# Patient Record
Sex: Male | Born: 1965 | Race: White | Hispanic: No | Marital: Married | Smoking: Former smoker
Health system: Southern US, Community
[De-identification: ages and names within clinical notes are randomized; demographics above are authoritative.]

## PROBLEM LIST (undated history)

## (undated) ENCOUNTER — Emergency Department
Admission: EM | Discharge status: Against Medical Advice | Payer: BC Managed Care – PPO | Attending: Emergency Medicine | Admitting: Emergency Medicine

## (undated) DIAGNOSIS — IMO0001 Reserved for inherently not codable concepts without codable children: Secondary | ICD-10-CM

## (undated) DIAGNOSIS — H539 Unspecified visual disturbance: Secondary | ICD-10-CM

## (undated) DIAGNOSIS — E119 Type 2 diabetes mellitus without complications: Secondary | ICD-10-CM

## (undated) DIAGNOSIS — M1990 Unspecified osteoarthritis, unspecified site: Secondary | ICD-10-CM

## (undated) DIAGNOSIS — J302 Other seasonal allergic rhinitis: Secondary | ICD-10-CM

## (undated) DIAGNOSIS — G629 Polyneuropathy, unspecified: Secondary | ICD-10-CM

## (undated) DIAGNOSIS — E785 Hyperlipidemia, unspecified: Secondary | ICD-10-CM

## (undated) DIAGNOSIS — T8859XA Other complications of anesthesia, initial encounter: Secondary | ICD-10-CM

## (undated) DIAGNOSIS — I10 Essential (primary) hypertension: Secondary | ICD-10-CM

## (undated) DIAGNOSIS — G4730 Sleep apnea, unspecified: Secondary | ICD-10-CM

## (undated) DIAGNOSIS — K219 Gastro-esophageal reflux disease without esophagitis: Secondary | ICD-10-CM

## (undated) DIAGNOSIS — M545 Low back pain, unspecified: Secondary | ICD-10-CM

## (undated) HISTORY — PX: JOINT REPLACEMENT: SHX530

## (undated) HISTORY — DX: Other seasonal allergic rhinitis: J30.2

## (undated) HISTORY — DX: Reserved for inherently not codable concepts without codable children: IMO0001

## (undated) HISTORY — DX: Type 2 diabetes mellitus without complications: E11.9

## (undated) HISTORY — PX: HAND SURGERY: SHX662

---

## 2005-02-25 DIAGNOSIS — M549 Dorsalgia, unspecified: Secondary | ICD-10-CM | POA: Insufficient documentation

## 2011-07-06 DIAGNOSIS — E1140 Type 2 diabetes mellitus with diabetic neuropathy, unspecified: Secondary | ICD-10-CM | POA: Insufficient documentation

## 2011-07-06 DIAGNOSIS — K029 Dental caries, unspecified: Secondary | ICD-10-CM | POA: Insufficient documentation

## 2011-07-06 DIAGNOSIS — E11319 Type 2 diabetes mellitus with unspecified diabetic retinopathy without macular edema: Secondary | ICD-10-CM | POA: Insufficient documentation

## 2012-01-07 DIAGNOSIS — K219 Gastro-esophageal reflux disease without esophagitis: Secondary | ICD-10-CM | POA: Insufficient documentation

## 2014-05-27 ENCOUNTER — Emergency Department
Admission: EM | Admit: 2014-05-27 | Discharge: 2014-05-27 | Disposition: A | Discharge status: Home / Self Care | Payer: BC Managed Care – PPO | Attending: Emergency Medicine | Admitting: Emergency Medicine

## 2014-05-27 ENCOUNTER — Emergency Department: Payer: BC Managed Care – PPO

## 2014-05-27 DIAGNOSIS — S3992XA Unspecified injury of lower back, initial encounter: Secondary | ICD-10-CM | POA: Insufficient documentation

## 2014-05-27 DIAGNOSIS — M5459 Other low back pain: Secondary | ICD-10-CM

## 2014-05-27 DIAGNOSIS — X58XXXA Exposure to other specified factors, initial encounter: Secondary | ICD-10-CM | POA: Insufficient documentation

## 2014-05-27 DIAGNOSIS — M545 Low back pain: Secondary | ICD-10-CM | POA: Insufficient documentation

## 2014-05-27 MED ORDER — MELOXICAM 7.5 MG PO TABS
7.5000 mg | ORAL_TABLET | Freq: Every day | ORAL | Status: DC
Start: 2014-05-27 — End: 2015-03-10

## 2014-05-27 MED ORDER — CYCLOBENZAPRINE HCL 10 MG PO TABS
10.0000 mg | ORAL_TABLET | Freq: Three times a day (TID) | ORAL | Status: DC | PRN
Start: 2014-05-27 — End: 2016-07-01

## 2014-05-27 MED ORDER — KETOROLAC TROMETHAMINE 30 MG/ML IJ SOLN
INTRAMUSCULAR | Status: AC
Start: 2014-05-27 — End: ?
  Filled 2014-05-27: qty 2

## 2014-05-27 MED ORDER — KETOROLAC TROMETHAMINE 30 MG/ML IJ SOLN
60.0000 mg | Freq: Once | INTRAMUSCULAR | Status: AC
Start: 2014-05-27 — End: 2014-05-27
  Administered 2014-05-27: 60 mg via INTRAMUSCULAR

## 2014-05-27 NOTE — ED Provider Notes (Signed)
[place]  History and Physical Exam       Patient Name: [name], [name]  Encounter Date:  05/27/2014  Physician Assistant: Melynda Keller, PA-C  Attending Physician: Ashley Mariner, MD  PCP: Harlow Ohms, MD  Patient DOB:  [birth year]  MRN:  [id number]  Room:  [id number]      History of Presenting Illness     Chief complaint: Back Pain and Sinusitis    HPI/ROS given by: Patient    [name] is a 49 y.o. male who presents with acute low back pain. 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 Sharp pain in the middle to low back. No radiation of pain or radicular symptoms. Does have a history of degenerative disc disease and chronic low back pain. Denies weakness, bowel/bladder dysfunction, saddle anesthesia, fever, weight loss, night sweats, night pain, IVDA, or personal h/o cancer.      Review of Systems     Review of Systems   Constitutional: Negative for fever, chills and unexpected weight change.   Respiratory: Negative for shortness of breath.    Cardiovascular: Negative for chest pain.   Gastrointestinal: Negative for nausea, vomiting, abdominal pain, diarrhea, constipation, blood in stool and abdominal distention.        No incontinence     Genitourinary: Negative for dysuria, urgency, frequency, hematuria, flank pain and difficulty urinating.        No incontinence or retention   Musculoskeletal: Positive for back pain.   Skin: Negative for pallor.   Neurological: Negative for weakness and numbness.   Psychiatric/Behavioral: Negative for agitation.   All other systems reviewed and are negative.       Allergies & Medications     Pt has No Known Allergies.    Current/Home Medications    No medications on file        Past Medical History     Pt has no past medical history on file.     Past Surgical History     Pt  has no past surgical history on file.     Family History     The family history is not on file.     Social History     Pt has no  tobacco, alcohol, and drug history on file.     Physical Exam     Pulse 115, resp. rate 16, weight 94 kg, SpO2 100 %.    Constitutional: Well developed, well nourished, active, in no apparent distress.  HENT:   Head: Normocephalic, atraumatic  Ears: No external lesions.  Nose: No external lesions. No epistaxis or drainage.  Eyes: PERRL. No scleral icterus. No conjunctival injection. EOMI.  Neck: Trachea is midline. No JVD. Normal range of motion. No apparent masses.  Cardiovascular: Regular rhythm, S1 normal and S2 normal. No murmur heard.  Pulmonary/Chest: Effort normal. Lungs clear to auscultation bilaterally.   Abdominal: Soft, non-tender, non-distended. No masses.   Genitourinay/Anorectal: Defferred  Musculoskeletal:   Pt is able to walk on heels/toes and rise from squat without difficulty.   Strength is 5/5 in quads, hams, iliopsoas, EHL, ankle dorsiflexion and plantarflexion.  Sensation is intact to light touch in L1-S2 dermatomes  Negative straight leg raise.   Patellar reflexes are 2/4 on the right side and 2/4 on the left.  Achilles reflexes are 2/4 on the right side and 2/4 on the left.    Neurological: Pt is alert. Cranial nerves are grossly intact. Moving all  extremities without apparent deficit.   Psychiatric: Affect is appropriate. There is no agitation.   Skin: Skin is warm, dry, well perfused. No rash noted. No cyanosis. No pallor. No apparent wound.       Diagnostic Results     The results of the diagnostic studies below have been reviewed by myself:    Labs  Results     ** No results found for the last 24 hours. **          Radiologic Studies  No results found.       ED Course and Medical Decision Making     ED Medication Orders     Start Ordered     Status Ordering Provider    05/27/14 1805 05/27/14 1804  ketorolac (TORADOL) injection 60 mg   Once in ED     Route: Intramuscular  Ordered Dose: 60 mg     Ordered Zymir Napoli          Differential diagnosis includes mechanical back pain,  lumbosacral radiculopathy, vertebral fracture, epidural compression syndrome/cauda equina syndrome, myelopathy, epidural abscess, diskitis, vertebral osteomyelitis, renal colic, pyelonephritis, AAA, psoas abscess, malignancy.    History and exam consistent with mechanical low back pain. Low suspicion for malignancy, infection, fracture, epidural compression syndrome or other serious pathology considered in the ddx. Pt advised to avoid activities that exacerbate pain, but to remain as active as possible. Advised to avoid lifting >5lbs for 1 week. Advised to return for new or worsening symptoms, fever, bowel/bladder dysfunction, weakness. Pt understands and agrees with treatment plan.     In addition to the above history, please see nursing notes. Allergies, meds, past medical, family, social hx, and the results of the diagnostic studies performed have been reviewed by myself.    I discussed this case with the attending physician in the emergency department and they agree with the assessment and treatment plan.     This chart was generated by an EMR and may contain errors or omissions not intended by the user.     Procedures / Critical Care     None     Diagnosis / Disposition     Clinical Impression  1. Acute mechanical low back pain with duration of less than six weeks    2. Lower back injury, initial encounter        Disposition  ED Disposition     Discharge [name] discharge to home/self care.    Condition at disposition: Stable            Follow up for Discharged Patients  Drucie Ip, MD  [address]  [place] [address]  [phone]    Schedule an appointment as soon as possible for a visit  To establish primary care      Prescriptions for Discharged Patients  New Prescriptions    CYCLOBENZAPRINE (FLEXERIL) 10 MG TABLET    Take 1 tablet (10 mg total) by mouth every 8 (eight) hours as needed for Muscle spasms (pain or muscle spasm).    MELOXICAM (MOBIC) 7.5 MG TABLET    Take 1 tablet (7.5 mg  total) by mouth daily.              Melynda Keller, [place]  05/27/14 1805    Ashley Mariner, MD  05/28/14 1007

## 2014-05-27 NOTE — Discharge Instructions (Signed)
Back Pain [Acute Or Chronic]    Back pain is usually caused by an injury to the muscles or ligaments of the spine. Sometimes the disks that separate each bone in the spine may bulge and cause pain by pressing on a nearby nerve. Back pain may also appear after a sudden twisting/bending force (such as in a car accident), after a simple awkward movement, or lifting something heavy with poor body positioning. In either case, muscle spasm is often present and adds to the pain.  Acute back pain usually gets better in one to two weeks. Back pain related to disk disease, arthritis in the spinal joints or spinal stenosis (narrowing of the spinal canal) can become chronic and last for months or years.  Unless you had a physical injury (for example, a car accident or fall) X-rays are usually not ordered for the initial evaluation of back pain. If pain continues and does not respond to medical treatment, x-rays and other tests may be performed at a later time.  Home Care:   You may need to stay in bed the first few days. But, as soon as possible, begin sitting or walking to avoid problems with prolonged bed rest (muscle weakness, worsening back stiffness and pain, blood clots in the legs).   When in bed, try to find a position of comfort. A firm mattress is best. Try lying flat on your back with pillows under your knees. You can also try lying on your side with your knees bent up towards your chest and a pillow between your knees.   Avoid prolonged sitting. This puts more stress on the lower back than standing or walking.   During the first two days after injury, apply an ICE PACK to the painful area for 20 minutes every 2-4 hours. This will reduce swelling and pain. HEAT (hot shower, hot bath or heating pad) works well for muscle spasm. You can start with ice, then switch to heat after two days. Some patients feel best alternating ice and heat treatments. Use the one method that feels the best to you.   You may use  acetaminophen (Tylenol) or ibuprofen (Motrin, Advil) to control pain, unless another pain medicine was prescribed. [NOTE: If you have chronic liver or kidney disease or ever had a stomach ulcer or GI bleeding, talk with your doctor before using these medicines.]   Be aware of safe lifting methods and do not lift anything over 15 pounds until all the pain is gone.  Follow Up  with your doctor or this facility if your symptoms do not start to improve after one week. Physical therapy may be needed.  [NOTE: If X-rays were taken, they will be reviewed by a radiologist. You will be notified of any new findings that may affect your care.]  Get Prompt Medical Attention  if any of the following occur:  1. Pain becomes worse or spreads to your legs  2. Weakness or numbness in one or both legs  3. Loss of bowel or bladder control  4. Numbness in the groin or genital area   2000-2015 [employer] [facility], [place] [address]. All rights reserved. This information is not intended as a substitute for professional medical care. Always follow your healthcare professional's instructions.          Back Care Tips    These are things you can do to prevent a recurrence of acute back pain and to reduce symptoms from chronic back pain:  Maintain a healthy weight. If you are overweight, losing weight will help most types of back pain.   Exercise is an important part of recovery from most types of back pain. The back is supported by the muscles behind and in front of the spine. This means both the back muscles and the abdominal muscles must be strengthened to provide better support for your spine.   Swimming and brisk walking are good overall exercises to improve your fitness level.   Practice safe lifting methods (below).   Practice good posture when sitting, standing and walking. Avoid prolonged sitting. This puts more stress on the lower back than standing or walking.   Wear quality shoes with  sufficient arch support. Foot and ankle alignment can affect back symptoms. Women should avoid high heels.   Therapeutic massage can help relieve acute and chronic back pain.   During the first two days after an acute injury or flare-up of chronic back pain, apply anice packto the painful area for 20 minutes every 2-4 hours. This will reduce swelling and pain. Heat (hot shower, hot bath, or heating pad) works well for muscle spasm. You can start with ice, then switch to heat after two days. Some patients feel best alternating ice and heat treatments. Use the one method that feels the best to you.   You may use acetaminophen (Tylenol) or ibuprofen (Motrin, Advil) to control pain, unless another medicine was prescribed. [NOTE: If you have chronic liver or kidney disease or ever had a stomach ulcer or GI bleeding, talk with your doctor before using these medicines.]  Lumbar Stretch   Here is a simple stretching exercise that will help relax muscle spasm and keep your back more limber. If exercise makes your back pain worse, don't do it.  5. Lie on your back with your knees bent and both feet on the ground.  6. Slowly raise your left knee to your chest as you flatten your lower back against the floor. Hold for 5 seconds.  7. Relax and repeat the exercise with your right knee.  8. Do 10 of these exercises for each leg.  Safe Lifting Method    Don't bend over at the waist to lift an object off the floor. Instead, bend your knees and hips in a squat.   Keep your back and head upright.   Hold the object close to your body, directly in front of you.   Straighten your legs to lift the object.   Lower the object to the floor in the reverse fashion.   If you must slide something across the floor, push it.  Posture Tips   SITTING  Sit in chairs with straight backs or low-back support. Keep your knees a little higher than your hips. If necessary, use a low stool to prop your feet on, so your feet are resting on  a solid surface.  When driving, sit up straight. Adjust the seat forward so you are not leaning toward the steering wheel. A small pillow or rolled towel behind your lower back may help if you are driving long distances.  STANDING  When standing for long periods, shift most of your weight to one leg at a time. Alternate legs every few minutes.  SLEEPING  The best way to sleep is on your side with your knees bent. Put a low pillow under your head to support your neck in a neutral spine position. Avoid thick pillows that bend your neck to one side. Put a  pillow between your legs to further relax your lower back. If you sleep on your back, put pillows under your knees to support your legs in a slightly flexed position. Use a firm mattress. If your mattress sags, replace it, or use a 1/2-inch plywood board under the mattress to add support.  Follow Up with your doctor or as directed by our staff.  [NOTE: If X-rays, a CT scan or an MRI scan were taken, they will be reviewed by a radiologist. You will be notified of any new findings that may affect your care.]  Return Promptly or contact your doctor if any of the following occur:   Pain becomes worse or spreads to your arms or legs   Weakness or numbness in one or both arms or legs   Loss of bowel or bladder control   Numbness in the groin area   2000-2015 [employer] [facility], [place] [address]. All rights reserved. This information is not intended as a substitute for professional medical care. Always follow your healthcare professional's instructions.          Exercises To Strengthen Your Lower Back  Strong lower-back and abdominal muscles work together to support your spine. These exercises will help strengthen the muscles of the lower back. It is important that you begin exercising slowly and increase levels gradually.  Always begin any exercise program with stretching. If you feel pain while doing any of these exercises, stop and  talk to your doctor about a more specific exercise program that suits your condition better.  Low Back Stretch   Lie on your back with your knees bent and both feet on the ground.    Slowly raise your left knee to your chest as you flatten your lower back against the floor. Hold for 5 seconds.   Relax and repeat the exercise with your right knee.   Do 10 of these exercises for each leg.   Repeat hugging both knees to your chest at the same time.  Building Lower Back Strength  9. Kneeling Lumbar Extension: Begin on your hands and knees. Simultaneously raise and straighten your right arm and left leg until they are parallel to the ground. Hold for 2 seconds and come back slowly to a starting position. Repeat with left arm and right leg, alternating 10 times.  10. Prone Lumbar Extension: Lie face down, arms extended overhead, palms on the floor. Simultaneously raise your right arm and left leg as high as comfortably possible. Hold for 10 seconds and slowly return to start. Repeat with left arm and right leg, alternating 10 times. Gradually build up to 20 times. (Advanced: Repeat this exercise raising both arms and both legs a few inches off the floor at the same time. Hold for 5 seconds and release.)  11. Pelvic Tilt: Lie on the floor on your back with your knees bent at 90 degrees. Your feet should be flat on the floor. Inhale, exhale, then slowly contract your abdominal muscles bringing your navel toward your spine. Let your pelvis rock back until your lower back is flat on the floor. Hold for 10 seconds while breathing smoothly.  12. Abdominal Crunch: Perform a Pelvic Tilt (above) flattening your lower back against the floor. Holding the tension in your abdominal muscles, take another breath and raise your shoulder blades off the ground (this is not a full sit-up). Keep your head in line with your body (don't bend your neck forward). Hold for 2 seconds,  then slowly lower.   [address] [employer] [facility], [place] [address]. All rights reserved. This information is not intended as a substitute for professional medical care. Always follow your healthcare professional's instructions.

## 2014-07-24 ENCOUNTER — Encounter (INDEPENDENT_AMBULATORY_CARE_PROVIDER_SITE_OTHER): Payer: Self-pay | Admitting: Family Medicine

## 2014-07-24 ENCOUNTER — Other Ambulatory Visit
Admission: RE | Admit: 2014-07-24 | Discharge: 2014-07-24 | Disposition: A | Discharge status: Home / Self Care | Payer: BC Managed Care – PPO | Source: Ambulatory Visit | Attending: Family Medicine | Admitting: Family Medicine

## 2014-07-24 ENCOUNTER — Ambulatory Visit (INDEPENDENT_AMBULATORY_CARE_PROVIDER_SITE_OTHER): Payer: BC Managed Care – PPO | Admitting: Family Medicine

## 2014-07-24 VITALS — BP 120/70 | HR 95 | Temp 97.7°F | Resp 16 | Ht 67.0 in | Wt 205.0 lb

## 2014-07-24 DIAGNOSIS — E1140 Type 2 diabetes mellitus with diabetic neuropathy, unspecified: Secondary | ICD-10-CM

## 2014-07-24 DIAGNOSIS — G5601 Carpal tunnel syndrome, right upper limb: Secondary | ICD-10-CM

## 2014-07-24 DIAGNOSIS — F17210 Nicotine dependence, cigarettes, uncomplicated: Secondary | ICD-10-CM

## 2014-07-24 DIAGNOSIS — Z720 Tobacco use: Secondary | ICD-10-CM

## 2014-07-24 DIAGNOSIS — Z794 Long term (current) use of insulin: Secondary | ICD-10-CM

## 2014-07-24 LAB — HEMOGLOBIN A1C: Hgb A1C, %: 12.5 %

## 2014-07-24 LAB — MICROALBUMIN, RANDOM URINE: Microalbumin-Random, U: 1450.05 ug/mL — ABNORMAL HIGH (ref 0.00–20.00)

## 2014-07-24 MED ORDER — ATORVASTATIN CALCIUM 10 MG PO TABS
10.0000 mg | ORAL_TABLET | Freq: Every day | ORAL | Status: DC
Start: ? — End: 2014-07-24

## 2014-07-24 MED ORDER — ASPIRIN 81 MG PO CHEW
81.0000 mg | CHEWABLE_TABLET | Freq: Every day | ORAL | Status: DC
Start: ? — End: 2014-07-24

## 2014-07-24 MED ORDER — METFORMIN HCL 1000 MG PO TABS
1000.0000 mg | ORAL_TABLET | Freq: Two times a day (BID) | ORAL | Status: DC
Start: ? — End: 2014-07-24

## 2014-07-24 MED ORDER — INSULIN DETEMIR 100 UNIT/ML SC SOLN
SUBCUTANEOUS | Status: DC
Start: ? — End: 2014-07-24

## 2014-07-24 MED ORDER — LISINOPRIL 10 MG PO TABS
10.0000 mg | ORAL_TABLET | Freq: Every day | ORAL | Status: DC
Start: ? — End: 2014-07-24

## 2014-07-24 NOTE — Progress Notes (Signed)
PROGRESS NOTE    Date Time: 07/24/2014 10:35 AM  Patient Name: [name], [name]  Primary Care Physician: Rhunette Croft, MD      History of Presenting Illness:   [name] is a 49 y.o. male who presents to the office with   Chief Complaint   Patient presents with   . new patient     get estb    1.  Get established-the patient is new to the practice is here to get established.  His complaint today is of right hand pain as well as monitoring of his diabetes mellitus-  2.  Diabetes mellitus-Patient reports compliance with diet and medication.  Present medications include  Levemir and metformin.  The last eye exam was greater than a year ago..  The patient is taking a daily aspirin, and ACEI or ARB, and a statin.  Denies polyuria, polydipsia, polyphagia, weight change, blurred vision. No history of diabetic retinopathy, diabetic nephropathy, diabetic neuropathy, diabetic gastroparesis.  3.  Right hand pain-he has lancinating pain into the median nerve distribution of the right hand.  He is right-hand dominant.  He's not had any trauma.  He denies swelling or effusion.  Symptoms get worse with repetitive use.  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    Past Medical History:     Past Medical History   Diagnosis Date   . Seasonal allergic rhinitis    . Diabetes mellitus    . Disc        Past Surgical History:   No past surgical history on file.    Family History:     Family History   Problem Relation Age of Onset   . Diabetes Mother    . Depression Sister    . COPD Sister    . Hypertension Brother        Social History:     History   Smoking status   . Current Every Day Smoker -- 0.50 packs/day   . Types: Cigarettes   Smokeless tobacco   . Never Used     History   Alcohol Use No     History   Drug Use No       Allergies:     Allergies   Allergen Reactions   . Flu Virus Vaccine Anaphylaxis       Medications:     Prior to Admission medications    Medication Sig Start Date End Date Taking? Authorizing  Provider   cyclobenzaprine (FLEXERIL) 10 MG tablet Take 1 tablet (10 mg total) by mouth every 8 (eight) hours as needed for Muscle spasms (pain or muscle spasm). 05/27/14  Yes Melynda Keller, PA   meloxicam (MOBIC) 7.5 MG tablet Take 1 tablet (7.5 mg total) by mouth daily. 05/27/14  Yes Melynda Keller, PA   insulin detemir (LEVEMIR) 100 UNIT/ML injection Inject into the skin. 28 units am and 30units pm    [provider]   metFORMIN (GLUCOPHAGE) 1000 MG tablet Take 1,000 mg by mouth 2 (two) times daily with meals.    [provider]       Review of Systems:      Constitutional: Negative for fever, no fatigue, no weight change.   HENT: Negative for ear pain, gets congestion at night, rhinorrhea and neck pain, he has some hearing loss was just tested at [facility], he does have some allergies for which he uses flonase, chronic rhinorrhea,   Eyes: Negative for discharge, redness and itching.  He has has some blurred vision when his blood sugar is elevated  Respiratory: Negative for cough and shortness of breath, no orthopnea. No history of COPD, he is a smoker and knows that he should cut back    Cardiovascular: Negative for chest pain, palpitations and leg swelling. No previous stress or cardiac catheterization.  No history of  EKG in the past   Genitourinary: Negative for dysuria, urgency and difficulty urinating.   Musculoskeletal: Negative for joint swelling and gait problem. He has some numbness, pain and tingling to the R hand because he uses a vibrating tool in his R hand ,  He does have a history of back pain.  He does use flexeril  Neurological: Negative for dizziness, weakness and headaches. No history of head trauma, seizures or CVA  Hematological: Does not bruise/bleed easily.   Psychiatric/Behavioral: Negative.  No uncontrolled depression   GI: Denies nausea, vomiting, diarrhea or constipation, no abdominal pain, no reflux/indigestion, no melena/hematochezia   Derm: no rashes, lesions or  pruritis    Physical Exam:     Filed Vitals:    07/24/14 1016   BP: 120/70   Pulse: 95   Temp: 97.7 F (36.5 C)   Resp: 16   SpO2: 98%     Body mass index is 32.1 kg/(m^2).    General: awake, alert, oriented x 3; no acute distress.  HEENT: eomi, sclera anicteric, oropharynx clear without lesions, mucous membranes moist, normocephalic, atraumatic, Fundiscopic exam with sharp disc margins, no exudate or lesions, no av crossing defects and no copper wiring.  Neck: supple, no lymphadenopathy, no thyromegaly, no JVD, no carotid bruits, trachea midline  Cardiovascular: regular rate and rhythm, no murmurs, rubs or gallops  Lungs: clear to auscultation bilaterally, without wheezing, rhonchi, or rales, resp even and unlabored with normal effort  Abdomen: soft, non-tender, non-distended; no palpable masses, no hepatosplenomegaly, normoactive bowel sounds, no rebound or guarding  Extremities: no clubbing, cyanosis, or edema, bilateral feet are without lesions  Musculoskeletal: No effusion or deformity, Tinel's and Phalen's are positive to the right hand  Neuro: cranial nerves grossly intact, strength 5/5 in upper and lower extremities, DTR 2+ and symmetric to the upper and lower extremities, sensation intact  Skin: no rashes or lesions noted  Psych: Affect and mood are congruent, well groomed, good eye contact  Other:  No results found for this or any previous visit (from the past 2016 hour(s)).         Assessment:     1. Type 2 diabetes mellitus with diabetic neuropathy    2. Carpal tunnel syndrome of right wrist         Plan:     Orders Placed This Encounter   Procedures   . Hemoglobin A1C     Standing Status: Future      Number of Occurrences: 1      Standing Expiration Date: 07/24/2015   . Microalbumin, Random Urine     Standing Status: Future      Number of Occurrences: 1      Standing Expiration Date: 07/25/2015        Requested Prescriptions     Signed Prescriptions Disp Refills   . metFORMIN (GLUCOPHAGE) 1000 MG tablet  180 tablet 3     Sig: Take 1 tablet (1,000 mg total) by mouth 2 (two) times daily with meals.   . insulin detemir (LEVEMIR) 100 UNIT/ML injection 40 mL 3     Sig: Take 30 units daily   .  lisinopril (PRINIVIL,ZESTRIL) 10 MG tablet 90 tablet 3     Sig: Take 1 tablet (10 mg total) by mouth daily.   Marland Kitchen atorvastatin (LIPITOR) 10 MG tablet 30 tablet 2     Sig: Take 1 tablet (10 mg total) by mouth daily.   Marland Kitchen aspirin (ASPIRIN CHILDRENS) 81 MG chewable tablet 90 tablet 0     Sig: Chew 1 tablet (81 mg total) by mouth daily.       Return in about 1 month (around 08/23/2014).    Patient Instructions   1. Take the levemir 30 units daily  2. We will get your A1C today and I will call you with the results  3. I am going to give you a prescription to get a splint to your wrist that I would like for you to use at night only  4. Some of the symptoms to your hand will improve with control of your sugar       Patient was counseled on possible medicine side effects which may include rash, swelling and/or stomach upset.  Patient was instructed to notify me or the ER if they experience problems.    Discussed with patient all diagnostics and consults ordered and addressed patient concerns prior to the completion of the visit.  Patient will be notified of results when they become available.  Encouraged to call for results if no contact within 14 days.     The patient's electronic medical record was reviewed, any changes in the past medical history, past surgical history, medications, diagnostic tests were noted, and the record was updated accordingly.    Discussed option available for my chart which provides electronic access to diagnostic results.      Signed by: Rhunette Croft, MD

## 2014-07-24 NOTE — Progress Notes (Deleted)
***   Help Text ***  LDA Data. This SmartLink displays specified LDA data; either by LDA type or by LDA group ID.  It should be setup for use in a SmartPhrase. If one is not available please Animator.    This SmartLink requires parameters. Parameters are variables which can be added to the [facility] name.  Note: this SmartLink will only display the last documented value for each assessment.  Usage: LDA[LDAtype:showremoved:showdays  LDAtype: A list of LDA types to display, separated by commas.   This can also be set to a specific LDA group ID. If using the group ID, it needs to be prefaced with a 'X'.  showremoved: This is used to show 'removed' LDAs; set this to 1 to show them.  showdays: This is a two piece comma-delimited parameter.   Set the first piece to 1 to display the number of days since insertion/assessment   Set the second piece to 1 to use calendar days. If null, the display will instead use 24 hour periods.  Examples:  LDA[7 outputs the data for all LDA groups with a LDA type of 7   LDA[X10:1:1,1 outputs the data for LDA group 10, shows removed LDAs and number of days using calendar days.

## 2014-07-24 NOTE — Progress Notes (Signed)
Date Specimen Drawn:  07/24/2014   Time Specimen Drawn:  11:31 AM   Test(s) Ordered:  a1c,micro   Disposition:  na   Patient's Tolerance:  well   Location Specimen Drawn:  rac

## 2014-07-24 NOTE — Patient Instructions (Signed)
1. Take the levemir 30 units daily  2. We will get your A1C today and I will call you with the results  3. I am going to give you a prescription to get a splint to your wrist that I would like for you to use at night only  4. Some of the symptoms to your hand will improve with control of your sugar

## 2014-08-22 ENCOUNTER — Ambulatory Visit (INDEPENDENT_AMBULATORY_CARE_PROVIDER_SITE_OTHER): Payer: BC Managed Care – PPO | Admitting: Family Medicine

## 2014-09-02 DIAGNOSIS — G5601 Carpal tunnel syndrome, right upper limb: Secondary | ICD-10-CM | POA: Insufficient documentation

## 2014-09-02 DIAGNOSIS — F17210 Nicotine dependence, cigarettes, uncomplicated: Secondary | ICD-10-CM | POA: Insufficient documentation

## 2014-09-02 DIAGNOSIS — E1165 Type 2 diabetes mellitus with hyperglycemia: Secondary | ICD-10-CM | POA: Insufficient documentation

## 2014-09-02 DIAGNOSIS — E1122 Type 2 diabetes mellitus with diabetic chronic kidney disease: Secondary | ICD-10-CM | POA: Insufficient documentation

## 2014-09-02 DIAGNOSIS — Z794 Long term (current) use of insulin: Secondary | ICD-10-CM | POA: Insufficient documentation

## 2014-09-03 ENCOUNTER — Ambulatory Visit (INDEPENDENT_AMBULATORY_CARE_PROVIDER_SITE_OTHER): Payer: BC Managed Care – PPO | Admitting: Family Medicine

## 2014-09-03 ENCOUNTER — Encounter (INDEPENDENT_AMBULATORY_CARE_PROVIDER_SITE_OTHER): Payer: Self-pay | Admitting: Family Medicine

## 2014-09-03 VITALS — BP 102/68 | HR 93 | Temp 97.5°F | Resp 16 | Ht 67.0 in | Wt 205.0 lb

## 2014-09-03 DIAGNOSIS — E1140 Type 2 diabetes mellitus with diabetic neuropathy, unspecified: Secondary | ICD-10-CM

## 2014-09-03 DIAGNOSIS — G5601 Carpal tunnel syndrome, right upper limb: Secondary | ICD-10-CM

## 2014-09-03 DIAGNOSIS — Z720 Tobacco use: Secondary | ICD-10-CM

## 2014-09-03 MED ORDER — DAPAGLIFLOZIN PROPANEDIOL 5 MG PO TABS
5.0000 mg | ORAL_TABLET | Freq: Every day | ORAL | Status: DC
Start: ? — End: 2014-09-03

## 2014-09-03 NOTE — Patient Instructions (Addendum)
1. We will start [place] and I would like for you to sign up online with the company so that the drug will continue to be free regardless of whether they change their policy.  I would like for you to increase your water intake as this will drop your blood pressure a little  2. Continue the metformin at 500mg  twice daily and we will plan to increase to 750 twice daily in a month and then increase to full dose after another month  3. Continue the levemir at your present dose as we want to lower your blood sugar gradually so you will feel well  4. We will wait about 3 months prior to having you see your eye doctor  5. It is going to take [place] a little while to get your diabetes back under control   6.  Smoking cessation is recommended

## 2014-09-03 NOTE — Progress Notes (Signed)
PROGRESS NOTE    Date Time: 09/03/2014 9:26 AM  Patient Name: [name], [name]  Primary Care Physician: Rhunette Croft, MD      History of Presenting Illness:   LINDBERGH FELIU is a 49 y.o. male who presents to the office with   Chief Complaint   Patient presents with   . Diabetes     90m f/up    1.  Uncontrolled diabetes mellitus type 2 -the patient had been off of his medication for several months because he lost his insurance.Marland Kitchen  He previously had been on Levemir 30 in the morning and 28 units at night.  He does have some blurred vision.  He's also got tingling in his toes and he has some carpal tunnel to the right hand he has had polyuria, polydipsia, polyphagia although all of that has improved.  He did lose a substantial amount of weight while he was off of his diabetic medications.  He is uncircumcised.  He's been checking his blood sugar twice a day.  His mornings have been running at about 2:30.  And his 2 hour postprandial after lunch are running around the 200 range.  He started taking the metformin 1 g twice a day and was not able to tolerate it so he is back down to 500 MG TWICE A DAY AND HE'S DOING FINE WITH THAT.  He was previously seeing an optometrist in [place] but that's been greater than a year since his last exam.  2.  CTS-the patient did get the wrist splint he has not noticed a great deal of improvement since he started using it.  This tends to get worse when he's aware.  There's been no trauma and no effusion or erythema or warmth.    Past Medical History:     Past Medical History   Diagnosis Date   . Seasonal allergic rhinitis    . Diabetes mellitus    . Disc        Past Surgical History:   No past surgical history on file.    Family History:     Family History   Problem Relation Age of Onset   . Diabetes Mother    . Depression Sister    . COPD Sister    . Hypertension Brother        Social History:     History   Smoking status   . Current Every Day Smoker -- 0.50 packs/day   .  Types: Cigarettes   Smokeless tobacco   . Never Used     History   Alcohol Use No     History   Drug Use No       Allergies:     Allergies   Allergen Reactions   . Flu Virus Vaccine Anaphylaxis       Medications:     Prior to Admission medications    Medication Sig Start Date End Date Taking? Authorizing Provider   aspirin (ASPIRIN CHILDRENS) 81 MG chewable tablet Chew 1 tablet (81 mg total) by mouth daily. 07/24/14  Yes Rhunette Croft, MD   atorvastatin (LIPITOR) 10 MG tablet Take 1 tablet (10 mg total) by mouth daily. 07/24/14 07/24/15 Yes Rhunette Croft, MD   insulin detemir (LEVEMIR) 100 UNIT/ML injection Take 30 units daily 07/24/14  Yes Rhunette Croft, MD   lisinopril (PRINIVIL,ZESTRIL) 10 MG tablet Take 1 tablet (10 mg total) by mouth daily. 07/24/14  Yes Rhunette Croft, MD   meloxicam [facility])  7.5 MG tablet Take 1 tablet (7.5 mg total) by mouth daily. 05/27/14  Yes Melynda Keller, PA   metFORMIN (GLUCOPHAGE) 1000 MG tablet Take 1 tablet (1,000 mg total) by mouth 2 (two) times daily with meals. 07/24/14  Yes Rhunette Croft, MD   cyclobenzaprine (FLEXERIL) 10 MG tablet Take 1 tablet (10 mg total) by mouth every 8 (eight) hours as needed for Muscle spasms (pain or muscle spasm). 05/27/14   Melynda Keller, PA       Review of Systems:      Constitutional: Negative for fever, no fatigue, no weight change.   HENT: Negative for ear pain, congestion, rhinorrhea and neck pain, no hearing loss  Eyes: Negative for discharge, redness and itching,   Respiratory: Negative for cough and shortness of breath, no orthopnea.    Cardiovascular: Negative for chest pain, palpitations and leg swelling.   Genitourinary: Negative for dysuria, urgency and difficulty urinating.   Musculoskeletal: Negative for joint swelling and gait problem.  CTS as noted above   Neurological: Negative for dizziness, weakness and headaches.   Hematological: Does not bruise/bleed easily.   Psychiatric/Behavioral: Negative.  No  uncontrolled depression   GI: Denies nausea, vomiting, diarrhea or constipation, no abdominal pain, no reflux/indigestion, no melena/hematochezia   Derm: no rashes, lesions or pruritis    Physical Exam:     Filed Vitals:    09/03/14 0902   BP: 102/68   Pulse: 93   Temp: 97.5 F (36.4 C)   Resp: 16   SpO2: 99%     Body mass index is 32.1 kg/(m^2).    General: awake, alert, oriented x 3; no acute distress.  HEENT: eomi, sclera anicteric, oropharynx clear without lesions, mucous membranes moist, normocephalic, atraumatic, Fundiscopic exam with sharp disc margins, no exudate or lesions, no av crossing defects and no copper wiring.  Neck: supple, no lymphadenopathy, no thyromegaly, no JVD, no carotid bruits, trachea midline  Cardiovascular: regular rate and rhythm, no murmurs, rubs or gallops  Lungs: clear to auscultation bilaterally, without wheezing, rhonchi, or rales, resp even and unlabored with normal effort  Abdomen: soft, non-tender, non-distended; no palpable masses, no hepatosplenomegaly, normoactive bowel sounds, no rebound or guarding  Extremities: no clubbing, cyanosis, or edema, bilateral feet are without lesions but are extremely dry   Musculoskeletal: No effusion or deformity  Neuro: cranial nerves grossly intact, strength 5/5 in upper and lower extremities, DTR 2+ and symmetric to the upper and lower extremities, sensation intact to monofilament to bilateral feet   Skin: no rashes or lesions noted  Psych: Affect and mood are congruent and normal.  , well groomed, good eye contact  Other:  Recent Results (from the past 2016 hour(s))   Hemoglobin A1C    Collection Time: 07/24/14 10:53 AM   Result Value Ref Range    Hgb A1C, % 12.5 %   Microalbumin, Random Urine    Collection Time: 07/24/14 10:53 AM   Result Value Ref Range    Microalbumin-Random, U 1450.05 (H) 0.00 - 20.00 mcg/mL        Assessment:     1. Type 2 diabetes mellitus with diabetic neuropathy    2. Carpal tunnel syndrome of right wrist    3.  Nicotine abuse         Plan:   No orders of the defined types were placed in this encounter.        Requested Prescriptions     Signed Prescriptions Disp Refills   . Dapagliflozin  Propanediol (FARXIGA) 5 MG Tab 30 tablet 11     Sig: Take 5 mg by mouth daily.       Return in about 1 month (around 10/04/2014).    Patient Instructions   1. We will start [place] and I would like for you to sign up online with the company so that the drug will continue to be free regardless of whether they change their policy.  I would like for you to increase your water intake as this will drop your blood pressure a little  2. Continue the metformin at 500mg  twice daily and we will plan to increase to 750 twice daily in a month and then increase to full dose after another month  3. Continue the levemir at your present dose as we want to lower your blood sugar gradually so you will feel well  4. We will wait about 3 months prior to having you see your eye doctor  5. It is going to take [place] a little while to get your diabetes back under control   6.  Smoking cessation is recommended       Patient was counseled on possible medicine side effects which may include rash, swelling and/or stomach upset.  Patient was instructed to notify me or the ER if they experience problems.    Discussed with patient all diagnostics and consults ordered and addressed patient concerns prior to the completion of the visit.  Patient will be notified of results when they become available.  Encouraged to call for results if no contact within 14 days.     The patient's electronic medical record was reviewed, any changes in the past medical history, past surgical history, medications, diagnostic tests were noted, and the record was updated accordingly.    This note was completed using Visual merchandiser. Grammatical errors, random word insertions, pronoun errors and incomplete sentences are therefore possible due to software  limitations. If you have questions or concerns regarding these errors or this dictation, please address them with the provider for clarification.     Discussed option available for my chart which provides electronic access to diagnostic results.        Signed by: Rhunette Croft, MD

## 2014-10-07 ENCOUNTER — Encounter (INDEPENDENT_AMBULATORY_CARE_PROVIDER_SITE_OTHER): Payer: Self-pay | Admitting: Family Medicine

## 2014-10-07 ENCOUNTER — Ambulatory Visit (INDEPENDENT_AMBULATORY_CARE_PROVIDER_SITE_OTHER): Payer: BC Managed Care – PPO | Admitting: Family Medicine

## 2014-10-07 ENCOUNTER — Encounter (INDEPENDENT_AMBULATORY_CARE_PROVIDER_SITE_OTHER): Payer: Self-pay

## 2014-10-07 VITALS — BP 104/58 | HR 103 | Temp 99.1°F | Resp 20 | Ht 67.0 in | Wt 202.0 lb

## 2014-10-07 DIAGNOSIS — K529 Noninfective gastroenteritis and colitis, unspecified: Secondary | ICD-10-CM

## 2014-10-07 MED ORDER — ONDANSETRON 4 MG PO TBDP
4.0000 mg | ORAL_TABLET | Freq: Three times a day (TID) | ORAL | Status: DC | PRN
Start: ? — End: 2014-10-07

## 2014-10-07 NOTE — Patient Instructions (Signed)
1. Zofran is for the nausea and you should let it melt on your tongue for it to be effective  2. Stick with clear liquids today and you can have starchy foods if you are hungry but mostly clear liquids   3. You are doing great with your smoking   4. If you don't feel that you can return to work tomorrow then let me know and we can give you another note

## 2014-10-07 NOTE — Progress Notes (Signed)
PROGRESS NOTE    Date Time: 10/07/2014 12:06 PM  Patient Name: [name], [name]  Primary Care Physician: Rhunette Croft, MD      History of Presenting Illness:   [name] is a 49 y.o. male who presents to the office with   Chief Complaint   Patient presents with   . Abdominal Pain     ,nasea,dizzy,chills,feels like fever, x 3 days,says has acid reflux   Gastroenteritis - symptoms started on Sunday, he had sharp pains in his stomach that radiated into his chest, he had dizziness, nausea, no vomiting, diarrhea Sunday morning,  Fever, malaise.  He thought he was doing better yesterday and did go back to work But was sent home.  Today he has stomach cramps and intermittent chills but has improved.  Blood glucose is less than 200, 120 at night, 180 this morning.  No recent travel.  No ill contact.  He has been feeling feverish. Saturday he ate out but no other members of his party have been ill.  No stools today. He is still having abdominal cramping.  He has decreased his oral intake, he has had anorexia.  The diarrhea has resolved and he had no melanoma or hematochezia.    Past Medical History:     Past Medical History   Diagnosis Date   . Seasonal allergic rhinitis    . Diabetes mellitus    . Disc        Past Surgical History:   No past surgical history on file.    Family History:     Family History   Problem Relation Age of Onset   . Diabetes Mother    . Depression Sister    . COPD Sister    . Hypertension Brother        Social History:     History   Smoking status   . Current Every Day Smoker -- 0.50 packs/day   . Types: Cigarettes   Smokeless tobacco   . Never Used     History   Alcohol Use No     History   Drug Use No       Allergies:     Allergies   Allergen Reactions   . Flu Virus Vaccine Anaphylaxis       Medications:     Prior to Admission medications    Medication Sig Start Date End Date Taking? Authorizing Provider   aspirin (ASPIRIN CHILDRENS) 81 MG chewable tablet Chew 1 tablet (81 mg total)  by mouth daily. 07/24/14  Yes Rhunette Croft, MD   atorvastatin (LIPITOR) 10 MG tablet Take 1 tablet (10 mg total) by mouth daily. 07/24/14 07/24/15 Yes Rhunette Croft, MD   Dapagliflozin Propanediol (FARXIGA) 5 MG Tab Take 5 mg by mouth daily. 09/03/14  Yes Rhunette Croft, MD   insulin detemir (LEVEMIR) 100 UNIT/ML injection Take 30 units daily 07/24/14  Yes Rhunette Croft, MD   lisinopril (PRINIVIL,ZESTRIL) 10 MG tablet Take 1 tablet (10 mg total) by mouth daily. 07/24/14  Yes Rhunette Croft, MD   meloxicam (MOBIC) 7.5 MG tablet Take 1 tablet (7.5 mg total) by mouth daily. 05/27/14  Yes Melynda Keller, PA   metFORMIN (GLUCOPHAGE) 1000 MG tablet Take 1 tablet (1,000 mg total) by mouth 2 (two) times daily with meals. 07/24/14  Yes Rhunette Croft, MD   cyclobenzaprine (FLEXERIL) 10 MG tablet Take 1 tablet (10 mg total) by mouth every 8 (eight) hours as needed for  Muscle spasms (pain or muscle spasm). 05/27/14   Melynda Keller, PA       Review of Systems:      Constitutional: no weight change.   HENT: Negative for ear pain, congestion, rhinorrhea and neck pain, no hearing loss  Eyes: Negative for discharge, redness and itching, no visual disturbance.   Respiratory: Negative for cough and shortness of breath, no orthopnea.    Cardiovascular: Negative for chest pain, palpitations and leg swelling.   Genitourinary: Negative for dysuria, urgency and difficulty urinating.   Musculoskeletal: Negative for joint swelling and gait problem.   Neurological: Negative for dizziness, weakness and headaches.   Hematological: Does not bruise/bleed easily.   Psychiatric/Behavioral: Negative.  No uncontrolled depression   GI: See history of present illness  Derm: no rashes, lesions or pruritis    Physical Exam:     Filed Vitals:    10/07/14 1156   BP: 104/58   Pulse: 103   Temp: 99.1 F (37.3 C)   Resp: 20   SpO2: 97%     Body mass index is 31.63 kg/(m^2).    General: awake, alert, oriented x 3; no  acute distress.  HEENT: eomi, sclera anicteric, oropharynx clear without lesions, mucous membranes dry, normocephalic, atraumatic   Neck: supple, no lymphadenopathy, no thyromegaly, no JVD, no carotid bruits, trachea midline  Cardiovascular: regular rate and rhythm, no murmurs, rubs or gallops  Lungs: clear to auscultation bilaterally, without wheezing, rhonchi, or rales, resp even and unlabored with normal effort  Abdomen: soft, tenderness to the right upper quadrant but with negative Murphy's, non-distended; no palpable masses, no hepatosplenomegaly, hyperactive bowel sounds, no rebound or guarding  Extremities: no clubbing, cyanosis, or edema  Musculoskeletal: No effusion or deformity  Neuro: cranial nerves grossly intact  Skin: no rashes or lesions noted  Psych: Affect and mood are congruent and normal, well groomed, good eye contact  Other:   Recent Results (from the past 2016 hour(s))   Hemoglobin A1C    Collection Time: 07/24/14 10:53 AM   Result Value Ref Range    Hgb A1C, % 12.5 %   Microalbumin, Random Urine    Collection Time: 07/24/14 10:53 AM   Result Value Ref Range    Microalbumin-Random, U 1450.05 (H) 0.00 - 20.00 mcg/mL        Assessment:     1. Gastroenteritis         Plan:   No orders of the defined types were placed in this encounter.        Requested Prescriptions     Signed Prescriptions Disp Refills   . ondansetron (ZOFRAN ODT) 4 MG disintegrating tablet 20 tablet 0     Sig: Take 1 tablet (4 mg total) by mouth every 8 (eight) hours as needed for Nausea.       Return if symptoms worsen or fail to improve.    Patient Instructions   1. Zofran is for the nausea and you should let it melt on your tongue for it to be effective  2. Stick with clear liquids today and you can have starchy foods if you are hungry but mostly clear liquids   3. You are doing great with your smoking   4. If you don't feel that you can return to work tomorrow then let me know and we can give you another note       Patient  was counseled on possible medicine side effects which may include rash, swelling and/or stomach upset.  Patient was instructed to notify me or the ER if they experience problems.    Discussed with patient all diagnostics and consults ordered and addressed patient concerns prior to the completion of the visit.  Patient will be notified of results when they become available.  Encouraged to call for results if no contact within 14 days.     The patient's electronic medical record was reviewed, any changes in the past medical history, past surgical history, medications, diagnostic tests were noted, and the record was updated accordingly.    This note was completed using Visual merchandiser. Grammatical errors, random word insertions, pronoun errors and incomplete sentences are therefore possible due to software limitations. If you have questions or concerns regarding these errors or this dictation, please address them with the provider for clarification.     Discussed option available for my chart which provides electronic access to diagnostic results.        Signed by: Rhunette Croft, MD

## 2014-10-10 ENCOUNTER — Encounter (INDEPENDENT_AMBULATORY_CARE_PROVIDER_SITE_OTHER): Payer: Self-pay

## 2014-10-10 ENCOUNTER — Ambulatory Visit (INDEPENDENT_AMBULATORY_CARE_PROVIDER_SITE_OTHER): Payer: BC Managed Care – PPO | Admitting: Family Medicine

## 2014-10-10 ENCOUNTER — Encounter (INDEPENDENT_AMBULATORY_CARE_PROVIDER_SITE_OTHER): Payer: Self-pay | Admitting: Family Medicine

## 2014-10-10 ENCOUNTER — Ambulatory Visit
Admission: RE | Admit: 2014-10-10 | Discharge: 2014-10-10 | Disposition: A | Discharge status: Home / Self Care | Payer: BC Managed Care – PPO | Source: Ambulatory Visit | Attending: Family Medicine | Admitting: Family Medicine

## 2014-10-10 ENCOUNTER — Other Ambulatory Visit
Admission: RE | Admit: 2014-10-10 | Discharge: 2014-10-10 | Disposition: A | Discharge status: Home / Self Care | Payer: BC Managed Care – PPO | Source: Ambulatory Visit | Attending: Family Medicine | Admitting: Family Medicine

## 2014-10-10 VITALS — BP 124/66 | HR 105 | Temp 99.6°F | Resp 20 | Ht 67.0 in | Wt 202.0 lb

## 2014-10-10 DIAGNOSIS — M79672 Pain in left foot: Secondary | ICD-10-CM | POA: Insufficient documentation

## 2014-10-10 DIAGNOSIS — E11621 Type 2 diabetes mellitus with foot ulcer: Secondary | ICD-10-CM

## 2014-10-10 DIAGNOSIS — L97529 Non-pressure chronic ulcer of other part of left foot with unspecified severity: Secondary | ICD-10-CM

## 2014-10-10 DIAGNOSIS — E1140 Type 2 diabetes mellitus with diabetic neuropathy, unspecified: Secondary | ICD-10-CM

## 2014-10-10 MED ORDER — CEPHALEXIN 500 MG PO CAPS
500.0000 mg | ORAL_CAPSULE | Freq: Four times a day (QID) | ORAL | Status: DC
Start: ? — End: 2014-10-10

## 2014-10-10 MED ORDER — METFORMIN HCL 1000 MG PO TABS
1000.0000 mg | ORAL_TABLET | Freq: Two times a day (BID) | ORAL | Status: DC
Start: ? — End: 2014-10-10

## 2014-10-10 MED ORDER — SILVER SULFADIAZINE 1 % EX CREA
TOPICAL_CREAM | CUTANEOUS | Status: DC
Start: ? — End: 2014-10-10

## 2014-10-10 MED ORDER — ATORVASTATIN CALCIUM 10 MG PO TABS
10.0000 mg | ORAL_TABLET | Freq: Every day | ORAL | Status: DC
Start: ? — End: 2014-10-10

## 2014-10-10 NOTE — Progress Notes (Signed)
PROGRESS NOTE    Date Time: 10/10/2014 9:43 AM  Patient Name: [name], [name]  Primary Care Physician: Rhunette Croft, MD      History of Presenting Illness:   [name] is a 49 y.o. male who presents to the office with   Chief Complaint   Patient presents with   . Diabetes     f/up 69m   . Abrasion     side of left great toe x 7 days,small are on right foot little toe x 2 weeks   1. Diabetes Mellitus - Patient reports compliance with diet and medication.  Present medications include  levemir units and farxiga.  The patient is taking a daily aspirin, and ACEI or ARB, and a statin.  Denies polyuria, polydipsia, polyphagia, weight change, blurred vision.  Patient is monitoring fingerstick glucose and has readings of 130 - 150  with isolated reading over 200.  No history of diabetic retinopathy, diabetic nephropathy, diabetic neuropathy, diabetic gastroparesis.  He has a diabetic foot ulcer as noted below  2.  Ulcer to the left great toe medially over the MCP-the patient states he had an open sore to that area for about the past 7 days.  He has missed couple of days of work.  He wears steel toes of his boots were rubbing on that area.  He had a blister and then the skin sloughed off and has been open.  It's not painful.  There is no erythema to the area.  He is concerned because his left toe was the same toe that he had had an ulcer that went to the bone, and he almost lost the toe.      Past Medical History:     Past Medical History   Diagnosis Date   . Seasonal allergic rhinitis    . Diabetes mellitus    . Disc        Past Surgical History:   No past surgical history on file.    Family History:     Family History   Problem Relation Age of Onset   . Diabetes Mother    . Depression Sister    . COPD Sister    . Hypertension Brother        Social History:     History   Smoking status   . Current Every Day Smoker -- 0.50 packs/day   . Types: Cigarettes   Smokeless tobacco   . Never Used     History   Alcohol  Use No     History   Drug Use No       Allergies:     Allergies   Allergen Reactions   . Flu Virus Vaccine Anaphylaxis       Medications:     Prior to Admission medications    Medication Sig Start Date End Date Taking? Authorizing Provider   aspirin (ASPIRIN CHILDRENS) 81 MG chewable tablet Chew 1 tablet (81 mg total) by mouth daily. 07/24/14  Yes Rhunette Croft, MD   atorvastatin (LIPITOR) 10 MG tablet Take 1 tablet (10 mg total) by mouth daily. 07/24/14 07/24/15 Yes Rhunette Croft, MD   cyclobenzaprine (FLEXERIL) 10 MG tablet Take 1 tablet (10 mg total) by mouth every 8 (eight) hours as needed for Muscle spasms (pain or muscle spasm). 05/27/14  Yes Melynda Keller, PA   Dapagliflozin Propanediol (FARXIGA) 5 MG Tab Take 5 mg by mouth daily. 09/03/14  Yes Rhunette Croft, MD   insulin  detemir (LEVEMIR) 100 UNIT/ML injection Take 30 units daily 07/24/14  Yes Rhunette Croft, MD   lisinopril (PRINIVIL,ZESTRIL) 10 MG tablet Take 1 tablet (10 mg total) by mouth daily. 07/24/14  Yes Rhunette Croft, MD   meloxicam (MOBIC) 7.5 MG tablet Take 1 tablet (7.5 mg total) by mouth daily. 05/27/14  Yes Melynda Keller, PA   metFORMIN (GLUCOPHAGE) 1000 MG tablet Take 1 tablet (1,000 mg total) by mouth 2 (two) times daily with meals. 07/24/14  Yes Rhunette Croft, MD   ondansetron (ZOFRAN ODT) 4 MG disintegrating tablet Take 1 tablet (4 mg total) by mouth every 8 (eight) hours as needed for Nausea. 10/07/14  Yes Rhunette Croft, MD       Review of Systems:      Constitutional: Negative for fever, no fatigue, no weight change.   HENT: Negative for ear pain, congestion, rhinorrhea and neck pain, no hearing loss  Eyes: Negative for discharge, redness and itching, no visual disturbance.   Respiratory: Negative for cough and shortness of breath, no orthopnea.    Cardiovascular: Negative for chest pain, palpitations and leg swelling.   Genitourinary: Negative for dysuria, urgency and difficulty urinating.    Musculoskeletal: Negative for joint swelling and gait problem.   Neurological: Negative for dizziness, weakness and headaches.   Hematological: Does not bruise/bleed easily.   Psychiatric/Behavioral: Negative.  No uncontrolled depression   GI: Denies nausea, vomiting, diarrhea or constipation, no abdominal pain, no reflux/indigestion, no melena/hematochezia   Derm: See history of present illness    Physical Exam:     Filed Vitals:    10/10/14 0918   BP: 124/66   Pulse: 105   Temp: 99.6 F (37.6 C)   Resp: 20   SpO2: 99%     Body mass index is 31.63 kg/(m^2).    General: awake, alert, oriented x 3; no acute distress.  HEENT: eomi, sclera anicteric, oropharynx clear without lesions, mucous membranes moist, normocephalic, atraumatic, Fundiscopic exam with sharp disc margins, no exudate or lesions, no av crossing defects and no copper wiring.  Neck: supple, no lymphadenopathy, no thyromegaly, no JVD, no carotid bruits, trachea midline  Cardiovascular: regular rate and rhythm, no murmurs, rubs or gallops  Lungs: clear to auscultation bilaterally, without wheezing, rhonchi, or rales, resp even and unlabored with normal effort  Abdomen: soft, non-tender, non-distended; no palpable masses, no hepatosplenomegaly, normoactive bowel sounds, no rebound or guarding  Extremities: no clubbing, cyanosis, or edema  Musculoskeletal: No effusion or deformity  Neuro: cranial nerves grossly intact, strength 5/5 in upper and lower extremities, DTR 2+ and symmetric to the upper and lower extremities, sensation intact  Skin: Ulcer over the first left medial MTP, joint, ulcerated with yellow exudate to the lesions with an erythematous margin.  Psych: Affect and mood are congruent, well groomed, good eye contact  Other:   Recent Results (from the past 2016 hour(s))   Hemoglobin A1C    Collection Time: 07/24/14 10:53 AM   Result Value Ref Range    Hgb A1C, % 12.5 %   Microalbumin, Random Urine    Collection Time: 07/24/14 10:53 AM   Result  Value Ref Range    Microalbumin-Random, U 1450.05 (H) 0.00 - 20.00 mcg/mL            Assessment:     1. Type 2 diabetes mellitus with diabetic neuropathy    2. Diabetic ulcer of left foot associated with type 2 diabetes mellitus  Plan:     Orders Placed This Encounter   Procedures   . [facility] Wound Culture and Smear     Standing Status: Future      Number of Occurrences: 1      Standing Expiration Date: 10/10/2015     Order Specific Question:  Specimen     Answer:  Wound     Order Specific Question:  Source Detail     Answer:  Foot   . XR FOOT LEFT AP AND LATERAL     Standing Status: Future      Number of Occurrences: 1      Standing Expiration Date: 10/10/2015     Order Specific Question:  Reason for Exam:     Answer:  evaluate for osteomyelitis   . MRI FOOT LEFT WO CONTRAST     Standing Status: Future      Number of Occurrences:       Standing Expiration Date: 10/10/2015     Order Specific Question:  Does the patient have a pacemaker or defibrillator?     Answer:  No     Order Specific Question:  Reason for Exam:     Answer:  rule out osteoarthritis   . Ambulatory referral to Physical Therapy     Referral Priority:  Routine     Referral Type:  Physical Medicine     Referral Reason:  Specialty Services Required     Requested Specialty:  Physical Therapy     Number of Visits Requested:  1   . Wound Care - PT     Standing Status: Future      Number of Occurrences:       Standing Expiration Date: 10/10/2015        Requested Prescriptions     Signed Prescriptions Disp Refills   . metFORMIN (GLUCOPHAGE) 1000 MG tablet 180 tablet 3     Sig: Take 1 tablet (1,000 mg total) by mouth 2 (two) times daily with meals.   Marland Kitchen atorvastatin (LIPITOR) 10 MG tablet 90 tablet 3     Sig: Take 1 tablet (10 mg total) by mouth daily.   . silver sulfADIAZINE (SILVADENE, SSD) 1 % cream 50 g 1     Sig: Apply to affected area daily   . cephALEXin (KEFLEX) 500 MG capsule 40 capsule 0     Sig: Take 1 capsule (500 mg total) by mouth 4 (four) times  daily.       Return in about 1 month (around 11/10/2014).    Patient Instructions   1. Diabetes - continue the farxiga, levemir 30 units and metformin 1000mg  twice daily  2. Xray of the foot today if it is negative then I want to get an MRI of your foot  3. Dressing change daily with silvadene and we will start you on antibiotic Keflex  4. Light duty for 1 week  4. I referred you to physical therapy in [place] to the wound care clinic   5. Keep you foot elevated as much as possible  6. Continue to use the gold bond diabetic foot cream  7. Follow up in 1 month       Patient was counseled on possible medicine side effects which may include rash, swelling and/or stomach upset.  Patient was instructed to notify me or the ER if they experience problems.    Discussed with patient all diagnostics and consults ordered and addressed patient concerns prior to the completion of the visit.  Patient will  be notified of results when they become available.  Encouraged to call for results if no contact within 14 days.     The patient's electronic medical record was reviewed, any changes in the past medical history, past surgical history, medications, diagnostic tests were noted, and the record was updated accordingly.    Discussed option available for my chart which provides electronic access to diagnostic results.        Signed by: Rhunette Croft, MD

## 2014-10-10 NOTE — Patient Instructions (Signed)
1. Diabetes - continue the farxiga, levemir 30 units and metformin 1000mg  twice daily  2. Xray of the foot today if it is negative then I want to get an MRI of your foot  3. Dressing change daily with silvadene and we will start you on antibiotic Keflex  4. Light duty for 1 week  4. I referred you to physical therapy in [place] to the wound care clinic   5. Keep you foot elevated as much as possible  6. Continue to use the gold bond diabetic foot cream  7. Follow up in 1 month

## 2014-10-28 ENCOUNTER — Encounter (INDEPENDENT_AMBULATORY_CARE_PROVIDER_SITE_OTHER): Payer: Self-pay

## 2014-10-28 ENCOUNTER — Ambulatory Visit (INDEPENDENT_AMBULATORY_CARE_PROVIDER_SITE_OTHER): Payer: BC Managed Care – PPO | Admitting: Family Medicine

## 2014-10-28 ENCOUNTER — Encounter (INDEPENDENT_AMBULATORY_CARE_PROVIDER_SITE_OTHER): Payer: Self-pay | Admitting: Family Medicine

## 2014-10-28 ENCOUNTER — Other Ambulatory Visit
Admission: RE | Admit: 2014-10-28 | Discharge: 2014-10-28 | Disposition: A | Discharge status: Home / Self Care | Payer: BC Managed Care – PPO | Source: Ambulatory Visit | Attending: Family Medicine | Admitting: Family Medicine

## 2014-10-28 VITALS — BP 140/74 | HR 84 | Temp 98.0°F | Resp 16 | Ht 72.0 in | Wt 210.0 lb

## 2014-10-28 DIAGNOSIS — E11621 Type 2 diabetes mellitus with foot ulcer: Secondary | ICD-10-CM

## 2014-10-28 DIAGNOSIS — Z720 Tobacco use: Secondary | ICD-10-CM

## 2014-10-28 DIAGNOSIS — L97529 Non-pressure chronic ulcer of other part of left foot with unspecified severity: Secondary | ICD-10-CM

## 2014-10-28 DIAGNOSIS — E1140 Type 2 diabetes mellitus with diabetic neuropathy, unspecified: Secondary | ICD-10-CM

## 2014-10-28 DIAGNOSIS — Z794 Long term (current) use of insulin: Secondary | ICD-10-CM

## 2014-10-28 DIAGNOSIS — F17210 Nicotine dependence, cigarettes, uncomplicated: Secondary | ICD-10-CM

## 2014-10-28 LAB — COMPREHENSIVE METABOLIC PANEL
ALT: 23 U/L (ref 0–55)
AST (SGOT): 15 U/L (ref 10–42)
Albumin/Globulin Ratio: 1.21 Ratio (ref 0.70–1.50)
Albumin: 3.5 gm/dL (ref 3.5–5.0)
Alkaline Phosphatase: 84 U/L (ref 40–145)
Anion Gap: 12.8 mMol/L (ref 7.0–18.0)
BUN / Creatinine Ratio: 20.4 Ratio (ref 10.0–30.0)
BUN: 20 mg/dL (ref 7–22)
Bilirubin, Total: 0.3 mg/dL (ref 0.1–1.2)
CO2: 24.1 mMol/L (ref 20.0–30.0)
Calcium: 9.2 mg/dL (ref 8.5–10.5)
Chloride: 107 mMol/L (ref 98–110)
Creatinine: 0.98 mg/dL (ref 0.80–1.30)
EGFR: >60 mL/min/{1.73_m2}
Globulin: 2.9 gm/dL (ref 2.0–4.0)
Glucose: 254 mg/dL — ABNORMAL HIGH (ref 70–99)
Osmolality Calc: 289 mOsm/kg (ref 275–300)
Potassium: 4.9 mMol/L (ref 3.5–5.3)
Protein, Total: 6.4 gm/dL (ref 6.0–8.3)
Sodium: 139 mMol/L (ref 136–147)

## 2014-10-28 LAB — LIPID PANEL
Cholesterol: 186 mg/dL (ref 75–199)
Coronary Heart Disease Risk: 4.65
HDL: 40 mg/dL (ref 40–55)
LDL Calculated: 91 mg/dL
Triglycerides: 273 mg/dL — ABNORMAL HIGH (ref 10–150)
VLDL: 55 — ABNORMAL HIGH (ref 0–40)

## 2014-10-28 LAB — HEMOGLOBIN A1C: Hgb A1C, %: 9.1 %

## 2014-10-28 LAB — MICROALBUMIN, RANDOM URINE: Microalbumin-Random, U: 1081.69 ug/mL — ABNORMAL HIGH (ref 0.00–20.00)

## 2014-10-28 MED ORDER — GABAPENTIN 100 MG PO CAPS
100.0000 mg | ORAL_CAPSULE | Freq: Every evening | ORAL | Status: DC
Start: ? — End: 2014-10-28

## 2014-10-28 NOTE — Progress Notes (Signed)
Date Specimen Drawn:  10/28/2014   Time Specimen Drawn:  10:18 AM   Test(s) Ordered:  Lip,cmp,a1c,micro   Disposition:  na   Patient's Tolerance:  well   Location Specimen Drawn:  [facility]

## 2014-10-28 NOTE — Patient Instructions (Signed)
1. Diabetic education class  2. Continue present wound management  3. Goal A1C 6.5-7  4. Goal blood pressure 130/80  5. Excellent on your smoking cessation plans  6. I will refer you to the eye doctor  7. Continue your present medications

## 2014-10-28 NOTE — Progress Notes (Signed)
PROGRESS NOTE    Date Time: 10/28/2014 9:47 AM  Patient Name: [name], [name]  Primary Care Physician: Rhunette Croft, MD      History of Presenting Illness:   JAKIEM SCOGGINS is a 49 y.o. male who presents to the office with   Chief Complaint   Patient presents with   . Diabetes     31m f/up   . diabetic ulcer     left foot    1.  Diabetes mellitus-Patient reports compliance with diet and medication.  Present medications include  Levemir 30 units metformin 1 g twice a day, first seeing 5 mg daily.  The last eye exam was  a few years ago.  The patient is taking a daily aspirin, and ACEI, and a statin.  Denies polyuria, polydipsia, polyphagia, weight change, blurred vision.  Patient is monitoring fingerstick glucose and has readings of 100-140.  No history of diabetic retinopathy, diabetic nephropathy,  diabetic gastroparesis.the patient does have a history of diabetic neuropathy and he has numbness to his feet that he doesn't notice during the day when he has shoes on but when he goes to bed the numbness.  He started wearing his shoes too bad as this does help.  He also has a diabetic foot ulcer  2.  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  He did not go for wound care.  He's been using the Silvadene to the wound and it is closing up well.  3.  Nicotine abuse-the patient is down to just diffusely cigarettes and plans to quit this week.  He has been able to quit for 6 years past    Past Medical History:     Past Medical History   Diagnosis Date   . Seasonal allergic rhinitis    . Diabetes mellitus    . Disc        Past Surgical History:   No past surgical history on file.    Family History:     Family History   Problem Relation Age of Onset   . Diabetes Mother    . Depression Sister    . COPD Sister    . Hypertension Brother        Social History:     History   Smoking status   .  Current Every Day Smoker -- 0.50 packs/day   . Types: Cigarettes   Smokeless tobacco   . Never Used     History   Alcohol Use No     History   Drug Use No       Allergies:     Allergies   Allergen Reactions   . Flu Virus Vaccine Anaphylaxis       Medications:     Prior to Admission medications    Medication Sig Start Date End Date Taking? Authorizing Provider   aspirin (ASPIRIN CHILDRENS) 81 MG chewable tablet Chew 1 tablet (81 mg total) by mouth daily. 07/24/14  Yes Rhunette Croft, MD   atorvastatin (LIPITOR) 10 MG tablet Take 1 tablet (10 mg total) by mouth daily. 10/10/14 10/10/15 Yes Rhunette Croft, MD   Dapagliflozin Propanediol (FARXIGA) 5 MG Tab Take 5 mg by mouth daily. 09/03/14  Yes Rhunette Croft, MD   insulin detemir (LEVEMIR) 100 UNIT/ML injection Take 30 units daily 07/24/14  Yes Rhunette Croft, MD  lisinopril (PRINIVIL,ZESTRIL) 10 MG tablet Take 1 tablet (10 mg total) by mouth daily. 07/24/14  Yes Rhunette Croft, MD   metFORMIN (GLUCOPHAGE) 1000 MG tablet Take 1 tablet (1,000 mg total) by mouth 2 (two) times daily with meals. 10/10/14  Yes Rhunette Croft, MD   silver sulfADIAZINE (SILVADENE, SSD) 1 % cream Apply to affected area daily 10/10/14 10/10/15 Yes Rhunette Croft, MD   cyclobenzaprine (FLEXERIL) 10 MG tablet Take 1 tablet (10 mg total) by mouth every 8 (eight) hours as needed for Muscle spasms (pain or muscle spasm). 05/27/14   Melynda Keller, PA   meloxicam (MOBIC) 7.5 MG tablet Take 1 tablet (7.5 mg total) by mouth daily. 05/27/14   Melynda Keller, PA   ondansetron (ZOFRAN ODT) 4 MG disintegrating tablet Take 1 tablet (4 mg total) by mouth every 8 (eight) hours as needed for Nausea. 10/07/14   Rhunette Croft, MD       Review of Systems:      Constitutional: Negative for fever, no fatigue, no weight change.   HENT: Negative for ear pain, congestion, rhinorrhea and neck pain, no hearing loss  Eyes: Negative for discharge, redness and itching, no  visual disturbance.   Respiratory: Negative for cough and shortness of breath, no orthopnea.    Cardiovascular: Negative for chest pain, palpitations and leg swelling.   Genitourinary: Negative for dysuria, urgency and difficulty urinating.   Musculoskeletal: Negative for joint swelling and gait problem.   Neurological: Negative for dizziness, weakness and headaches.   Hematological: Does not bruise/bleed easily.   Psychiatric/Behavioral: Negative.  No uncontrolled depression   GI: Denies nausea, vomiting, diarrhea or constipation, no abdominal pain, no reflux/indigestion, no melena/hematochezia   Derm: See history of present illness    Physical Exam:     Filed Vitals:    10/28/14 0904   BP: 140/74   Pulse: 84   Temp: 98 F (36.7 C)   Resp: 16   SpO2: 96%     Body mass index is 28.47 kg/(m^2).    General: awake, alert, oriented x 3; no acute distress.  HEENT: eomi, sclera anicteric, oropharynx clear without lesions, mucous membranes moist, normocephalic, atraumatic  Neck: supple, no lymphadenopathy, no thyromegaly, no JVD, no carotid bruits, trachea midline  Cardiovascular: regular rate and rhythm, no murmurs, rubs or gallops  Lungs: clear to auscultation bilaterally, without wheezing, rhonchi, or rales, resp even and unlabored with normal effort  Abdomen: soft, non-tender, non-distended; no palpable masses, no hepatosplenomegaly, normoactive bowel sounds, no rebound or guarding  Extremities: no clubbing, cyanosis, or edema  Musculoskeletal: No effusion or deformity  Neuro: cranial nerves grossly intact  Skin: Diabetic ulcer to the left MTP which is healing well.  Good granulation tissue and markedly smaller than previous assessment.  Psych: Affect and mood are congruent and normal, well groomed, good eye contact  Other:   No results found for this or any previous visit (from the past 2016 hour(s)).       Assessment:     1. Type 2 diabetes mellitus with diabetic neuropathy, with long-term current use of insulin     2. Diabetic ulcer of left foot associated with type 2 diabetes mellitus    3. Nicotine abuse         Plan:     Orders Placed This Encounter   Procedures   . Lipid panel     Standing Status: Future      Number of Occurrences:  Standing Expiration Date: 10/28/2015     Order Specific Question:  Has the patient fasted?     Answer:  Yes   . Microalbumin, Random Urine     Standing Status: Future      Number of Occurrences:       Standing Expiration Date: 10/29/2015   . Hemoglobin A1C     Standing Status: Future      Number of Occurrences:       Standing Expiration Date: 10/29/2015   . Comprehensive metabolic panel     Standing Status: Future      Number of Occurrences:       Standing Expiration Date: 10/28/2015     Order Specific Question:  Has the patient fasted?     Answer:  Yes   . Ambulatory referral to Ophthalmology     Referral Priority:  Routine     Referral Type:  Consultation     Referral Reason:  Specialty Services Required     Requested Specialty:  Ophthalmology     Number of Visits Requested:  1        Requested Prescriptions     Signed Prescriptions Disp Refills   . gabapentin (NEURONTIN) 100 MG capsule 30 capsule 3     Sig: Take 1 capsule (100 mg total) by mouth nightly.       Return in about 3 months (around 01/27/2015).    Patient Instructions   1. Diabetic education class  2. Continue present wound management  3. Goal A1C 6.5-7  4. Goal blood pressure 130/80  5. Excellent on your smoking cessation plans  6. I will refer you to the eye doctor  7. Continue your present medications       Patient was counseled on possible medicine side effects which may include rash, swelling and/or stomach upset.  Patient was instructed to notify me or the ER if they experience problems.    Discussed with patient all diagnostics and consults ordered and addressed patient concerns prior to the completion of the visit.  Patient will be notified of results when they become available.  Encouraged to call for results if no  contact within 14 days.     The patient's electronic medical record was reviewed, any changes in the past medical history, past surgical history, medications, diagnostic tests were noted, and the record was updated accordingly.    This note was completed using Visual [employer]. Grammatical errors, random word insertions, pronoun errors and incomplete sentences are therefore possible due to software limitations. If you have questions or concerns regarding these errors or this dictation, please address them with the provider for clarification.     Discussed option available for my chart which provides electronic access to diagnostic results.        Signed by: Rhunette Croft, MD

## 2014-11-10 ENCOUNTER — Ambulatory Visit (INDEPENDENT_AMBULATORY_CARE_PROVIDER_SITE_OTHER): Payer: BC Managed Care – PPO | Admitting: Family Medicine

## 2014-12-19 ENCOUNTER — Encounter (INDEPENDENT_AMBULATORY_CARE_PROVIDER_SITE_OTHER): Payer: Self-pay | Admitting: Nurse Practitioner

## 2014-12-19 ENCOUNTER — Ambulatory Visit (INDEPENDENT_AMBULATORY_CARE_PROVIDER_SITE_OTHER): Payer: BC Managed Care – PPO | Admitting: Nurse Practitioner

## 2014-12-19 VITALS — BP 120/82 | HR 72 | Temp 98.4°F | Resp 14 | Ht 66.0 in | Wt 205.0 lb

## 2014-12-19 DIAGNOSIS — E119 Type 2 diabetes mellitus without complications: Secondary | ICD-10-CM

## 2014-12-19 DIAGNOSIS — L97511 Non-pressure chronic ulcer of other part of right foot limited to breakdown of skin: Secondary | ICD-10-CM

## 2014-12-19 DIAGNOSIS — L089 Local infection of the skin and subcutaneous tissue, unspecified: Secondary | ICD-10-CM

## 2014-12-19 MED ORDER — SULFAMETHOXAZOLE-TRIMETHOPRIM 800-160 MG PO TABS
1.0000 | ORAL_TABLET | Freq: Two times a day (BID) | ORAL | Status: DC
Start: 2014-12-19 — End: 2014-12-31

## 2014-12-19 MED ORDER — CEPHALEXIN 500 MG PO CAPS
500.0000 mg | ORAL_CAPSULE | Freq: Four times a day (QID) | ORAL | Status: DC
Start: 2014-12-19 — End: 2014-12-31

## 2014-12-19 NOTE — Patient Instructions (Signed)
Diabetes: Treating Minor Foot Infections  Diabetes makes it harder for the body to heal.Even minor problems, like a blister, can become infected. If not treated, infections can spread and damage nearby tissues. A hospital stay may be needed to treat it. Serious infections can result in amputations. Prompt treatment by your health care provider can help clear up infections and prevent serious problems.    Get treatment  If your doctor finds a minor infection, you'll be started on a treatment program. The goal is to heal the infected area while keeping the infection from spreading.   Your health care provider will examine and clean the infected area.   You may be given antibiotics to fight the infection. Take your antibiotic according to the instructions on the bottle. Take all the medications you are prescribed, even if the sore begins to look better. If you don't, the infection will not go away and may spread.   You may be asked to keep the infected area dry.   In certain cases, you may be told to keep your feet elevated or to limit walking.   Follow any instructions you are given regarding changing bandages or soaking your foot.  Follow-up care  Even with antibiotics and other treatments, a foot infection may take a long time to heal. For best results, be sure to keep all your follow-up appointments. These help ensure complete treatment. They also allow your health care provider to make sure you're healing properly.        [address] [employer] [place], [place] [address]. All rights reserved. This information is not intended as a substitute for professional medical care. Always follow your healthcare professional's instructions.

## 2014-12-19 NOTE — Progress Notes (Signed)
Subjective:    Patient ID:   [name] is a 49 y.o. male who complains of erythema and a lesion on his left foot that has gotten worse. He also complains of a older, not as significant lesion, on his right foot. He reports he is diabetic and is on insulin. He states he has to wear steel toe boots at work and these are causing his feet to break down. He denies other complaints other than chills last night.     ROS  Review of Systems   Constitutional: Negative for fever, chills, appetite change and fatigue.   HENT: Negative   Eyes: Negative for discharge, altered vision  Respiratory: Negative for chest tightness and shortness of breath  Cardiovascular: Negative for chest pain   Gastrointestinal: Negative for nausea, vomiting, abdominal pain and diarrhea    Genitourinary: Negative for frequency, burning, difficulty urinating  Musculoskeletal: Negative for myalgias  Skin: Positive for wound bilateral feet  Neurological: Negative for headaches or dizziness  Psychiatric/Behavioral: Negative for behavioral changes      The following portions of the patient's history were reviewed and updated as appropriate: allergies, current medications, past medical history, past surgical history and problem list.      Objective:     Filed Vitals:    12/19/14 1539   BP: 120/82   Pulse: 72   Temp: 98.4 F (36.9 C)   Resp: 14       Physical Exam:  Constitutional: oriented to person, place, and time. Appears well-developed and well-nourished. No distress.  HENT:   Head: Normocephalic and atraumatic.   Right Ear: External ear normal.  Left Ear: External ear normal.   Nose: Nose normal without drainage.  Eyes: Normal conjunctiva without drainage  Pulmonary/Chest: Effort normal.   Musculoskeletal: Normal range of motion without pain.    Neurological: The patient is alert and oriented to person, place, and time.   Skin: There is an approximate 2 cm white lesion on the right great toe at the hallux valgus with surrounding erythema and  warmth. This does not feel fluctuant. There is a brown colored scab on the right hallux valgus approximately 1.5 cm.  Psychiatric:  Normal mood and affect.                 Assessment and Plan:   Diagnoses and all orders for this visit:    Left foot infection  -     cephALEXin (KEFLEX) 500 MG capsule; Take 1 capsule (500 mg total) by mouth 4 (four) times daily.  -     sulfamethoxazole-trimethoprim (BACTRIM DS) 800-160 MG per tablet; Take 1 tablet by mouth 2 (two) times daily.  -     Ambulatory referral to Podiatry    Right foot ulcer, limited to breakdown of skin  -     Ambulatory referral to Podiatry    PLAN:  Patient instructed to follow up with podiatry as soon as possible. Patient instructed to report to ER with worsening of infection. Patient given work note to not wear steel toed boots until lesions healed and cleared by podiatry. The patient verbalized understanding and agreed with the plan.    Sharon Seller, NP  [facility]  12/19/2014 3:41 PM

## 2014-12-25 ENCOUNTER — Encounter (INDEPENDENT_AMBULATORY_CARE_PROVIDER_SITE_OTHER): Payer: Self-pay | Admitting: Family Medicine

## 2014-12-25 ENCOUNTER — Ambulatory Visit (INDEPENDENT_AMBULATORY_CARE_PROVIDER_SITE_OTHER): Payer: BC Managed Care – PPO | Admitting: Family Medicine

## 2014-12-25 ENCOUNTER — Ambulatory Visit
Admission: RE | Admit: 2014-12-25 | Discharge: 2014-12-25 | Disposition: A | Discharge status: Home / Self Care | Payer: BC Managed Care – PPO | Source: Ambulatory Visit | Attending: Family Medicine | Admitting: Family Medicine

## 2014-12-25 VITALS — BP 128/60 | HR 103 | Temp 98.4°F | Resp 18 | Ht 66.0 in | Wt 208.4 lb

## 2014-12-25 DIAGNOSIS — L03116 Cellulitis of left lower limb: Secondary | ICD-10-CM

## 2014-12-25 DIAGNOSIS — E11621 Type 2 diabetes mellitus with foot ulcer: Secondary | ICD-10-CM | POA: Insufficient documentation

## 2014-12-25 DIAGNOSIS — L97529 Non-pressure chronic ulcer of other part of left foot with unspecified severity: Secondary | ICD-10-CM

## 2014-12-25 MED ORDER — COLLAGENASE 250 UNIT/GM EX OINT
TOPICAL_OINTMENT | Freq: Every day | CUTANEOUS | Status: DC
Start: ? — End: 2014-12-25

## 2014-12-25 NOTE — Patient Instructions (Signed)
1. I would like you to change the dressing daily and apply the collagenase to help clear off the tissue that is not viable in the center.  2. I would like for you to go [place] [facility] to get an xray done which will help  [place] decide the best way to manage this.  If the xray is negative I would like to do an MRI  3. Continue the antibiotics and let me know if your blood sugars are going up as this can indicate that the infection is worsening

## 2014-12-25 NOTE — Progress Notes (Signed)
PROGRESS NOTE    Date Time: 12/25/2014 3:07 PM  Patient Name: [name], [name]  Primary Care Physician: Rhunette Croft, MD      History of Presenting Illness:   [name] is a 49 y.o. male who presents to the office with   Chief Complaint   Patient presents with   . Wound Infection     quick care f/u foot infection-left foot   Diabetic foot ulcer with cellulitis, was seen in urgent care on Friday and was started on keflex and bactrim, initally had a fever which has resolved. Patient was wearing steel toed shoes.  He has previously had diabetic ulcers to the L great toe plantar surface.  He was referred to podiatry and has not been seen yet    Past Medical History:     Past Medical History   Diagnosis Date   . Seasonal allergic rhinitis    . Diabetes mellitus    . Disc        Past Surgical History:   History reviewed. No pertinent past surgical history.    Family History:     Family History   Problem Relation Age of Onset   . Diabetes Mother    . Depression Sister    . COPD Sister    . Hypertension Brother        Social History:     History   Smoking status   . Current Every Day Smoker -- 0.50 packs/day   . Types: Cigarettes   Smokeless tobacco   . Never Used     History   Alcohol Use No     History   Drug Use No       Allergies:     Allergies   Allergen Reactions   . Flu Virus Vaccine Anaphylaxis       Medications:     Prior to Admission medications    Medication Sig Start Date End Date Taking? Authorizing Provider   aspirin (ASPIRIN CHILDRENS) 81 MG chewable tablet Chew 1 tablet (81 mg total) by mouth daily. 07/24/14  Yes Rhunette Croft, MD   atorvastatin (LIPITOR) 10 MG tablet Take 1 tablet (10 mg total) by mouth daily. 10/10/14 10/10/15 Yes Rhunette Croft, MD   cephALEXin (KEFLEX) 500 MG capsule Take 1 capsule (500 mg total) by mouth 4 (four) times daily. 12/19/14 12/29/14 Yes Sharon Seller, NP   cyclobenzaprine (FLEXERIL) 10 MG tablet Take 1 tablet (10 mg total) by mouth every 8  (eight) hours as needed for Muscle spasms (pain or muscle spasm). 05/27/14  Yes Melynda Keller, PA   Dapagliflozin Propanediol (FARXIGA) 5 MG Tab Take 5 mg by mouth daily. 09/03/14  Yes Rhunette Croft, MD   gabapentin (NEURONTIN) 100 MG capsule Take 1 capsule (100 mg total) by mouth nightly. 10/28/14  Yes Rhunette Croft, MD   insulin detemir (LEVEMIR) 100 UNIT/ML injection Take 30 units daily 07/24/14  Yes Rhunette Croft, MD   lisinopril (PRINIVIL,ZESTRIL) 10 MG tablet Take 1 tablet (10 mg total) by mouth daily. 07/24/14  Yes Rhunette Croft, MD   meloxicam (MOBIC) 7.5 MG tablet Take 1 tablet (7.5 mg total) by mouth daily. 05/27/14  Yes Melynda Keller, PA   metFORMIN (GLUCOPHAGE) 1000 MG tablet Take 1 tablet (1,000 mg total) by mouth 2 (two) times daily with meals. 10/10/14  Yes Rhunette Croft, MD   ondansetron (ZOFRAN ODT) 4 MG disintegrating tablet Take 1 tablet (4 mg total) by mouth  every 8 (eight) hours as needed for Nausea. 10/07/14  Yes Rhunette Croft, MD   silver sulfADIAZINE (SILVADENE, SSD) 1 % cream Apply to affected area daily 10/10/14 10/10/15 Yes Rhunette Croft, MD   sulfamethoxazole-trimethoprim (BACTRIM DS) 800-160 MG per tablet Take 1 tablet by mouth 2 (two) times daily. 12/19/14 12/29/14 Yes Sharon Seller, NP       Review of Systems:      12 system review negative except as noted in history of present illness    Physical Exam:     Filed Vitals:    12/25/14 1427   BP: 128/60   Pulse: 103   Temp: 98.4 F (36.9 C)   Resp: 18   SpO2: 98%     Body mass index is 33.65 kg/(m^2).    General: awake, alert, oriented x 3; no acute distress.  HEENT: eomi, sclera anicteric, oropharynx clear without lesions, mucous membranes moist, normocephalic, atraumatic.  Neck: supple, no lymphadenopathy, no thyromegaly, no JVD, no carotid bruits, trachea midline  Cardiovascular: regular rate and rhythm, no murmurs, rubs or gallops  Lungs: clear to auscultation bilaterally,  without wheezing, rhonchi, or rales, resp even and unlabored with normal effort  Abdomen: soft, non-tender, non-distended; no palpable masses, no hepatosplenomegaly, normoactive bowel sounds, no rebound or guarding  Extremities: no clubbing, cyanosis, or edema  Musculoskeletal: No effusion or deformity  Neuro: cranial nerves grossly intact  Skin: Left foot with a medial MCP ulcer with yellow exudate and erythema and warmth.  No noted fluctuance or drainage  Psych: Affect and mood are congruent, well groomed, good eye contact  Other:  Recent Results (from the past 2016 hour(s))   Lipid panel    Collection Time: 10/28/14 10:10 AM   Result Value Ref Range    Cholesterol 186 75 - 199 mg/dL    Triglycerides 273 (H) 10 - 150 mg/dL    HDL 40 40 - 55 mg/dL    LDL Calculated 91 mg/dL    Coronary Heart Disease Risk 4.65     VLDL 55 (H) 0 - 40   Microalbumin, Random Urine    Collection Time: 10/28/14 10:10 AM   Result Value Ref Range    Microalbumin-Random, U 1081.69 (H) 0.00 - 20.00 mcg/mL   Hemoglobin A1C    Collection Time: 10/28/14 10:10 AM   Result Value Ref Range    Hgb A1C, % 9.1 %   Comprehensive metabolic panel    Collection Time: 10/28/14 10:10 AM   Result Value Ref Range    Sodium 139 136 - 147 mMol/L    Potassium 4.9 3.5 - 5.3 mMol/L    Chloride 107 98 - 110 mMol/L    CO2 24.1 20.0 - 30.0 mMol/L    Calcium 9.2 8.5 - 10.5 mg/dL    Glucose 254 (H) 70 - 99 mg/dL    Creatinine 0.98 0.80 - 1.30 mg/dL    BUN 20 7 - 22 mg/dL    Protein, Total 6.4 6.0 - 8.3 gm/dL    Albumin 3.5 3.5 - 5.0 gm/dL    Alkaline Phosphatase 84 40 - 145 U/L    ALT 23 0 - 55 U/L    AST (SGOT) 15 10 - 42 U/L    Bilirubin, Total 0.3 0.1 - 1.2 mg/dL    Albumin/Globulin Ratio 1.21 0.70 - 1.50 Ratio    Anion Gap 12.8 7.0 - 18.0 mMol/L    BUN/Creatinine Ratio 20.4 10.0 - 30.0 Ratio    EGFR >60 mL/min/1.58m2  Osmolality Calc 289 275 - 300 mOsm/kg    Globulin 2.9 2.0 - 4.0 gm/dL        Assessment:     1. Diabetic ulcer of left foot associated with type 2  diabetes mellitus    2. Cellulitis of left lower extremity         Plan:     Orders Placed This Encounter   Procedures   . XR FOOT LEFT AP AND LATERAL     Standing Status: Future      Number of Occurrences: 1      Standing Expiration Date: 12/25/2015     Order Specific Question:  Reason for Exam:     Answer:  evaluate for osteomyelitis   . Ambulatory referral to Podiatry     Referral Priority:  Urgent     Referral Type:  Consultation     Referral Reason:  Specialty Services Required     Requested Specialty:  Podiatric Surgery     Number of Visits Requested:  1        Requested Prescriptions     Signed Prescriptions Disp Refills   . collagenase (SANTYL) ointment 30 g 0     Sig: Apply topically daily.       Return in about 2 weeks (around 01/08/2015).    Patient Instructions   1. I would like you to change the dressing daily and apply the collagenase to help clear off the tissue that is not viable in the center.  2. I would like for you to go [place] [facility] to get an xray done which will help  [place] decide the best way to manage this.  If the xray is negative I would like to do an MRI  3. Continue the antibiotics and let me know if your blood sugars are going up as this can indicate that the infection is worsening         Patient was counseled on possible medicine side effects which may include rash, swelling and/or stomach upset.  Patient was instructed to notify me or the ER if they experience problems.    Discussed with patient all diagnostics and consults ordered and addressed patient concerns prior to the completion of the visit.  Patient will be notified of results when they become available.  Encouraged to call for results if no contact within 14 days.     The patient's electronic medical record was reviewed, any changes in the past medical history, past surgical history, medications, diagnostic tests were noted, and the record was updated accordingly.    This note was completed using Engineer, production. Grammatical errors, random word insertions, pronoun errors and incomplete sentences are therefore possible due to software limitations. If you have questions or concerns regarding these errors or this dictation, please address them with the provider for clarification.     Discussed option available for my chart which provides electronic access to diagnostic results.        Signed by: Rhunette Croft, MD

## 2014-12-26 ENCOUNTER — Inpatient Hospital Stay: Payer: BC Managed Care – PPO | Admitting: Internal Medicine

## 2014-12-26 ENCOUNTER — Inpatient Hospital Stay
Admission: AD | Admit: 2014-12-26 | Discharge: 2014-12-31 | DRG: 540 | Disposition: A | Discharge status: Home Health | Payer: BC Managed Care – PPO | Source: Other Acute Inpatient Hospital | Attending: Internal Medicine | Admitting: Internal Medicine

## 2014-12-26 ENCOUNTER — Emergency Department
Admission: EM | Admit: 2014-12-26 | Discharge: 2014-12-26 | Payer: BC Managed Care – PPO | Attending: Sports Medicine" | Admitting: Sports Medicine"

## 2014-12-26 DIAGNOSIS — Z8619 Personal history of other infectious and parasitic diseases: Secondary | ICD-10-CM

## 2014-12-26 DIAGNOSIS — E11621 Type 2 diabetes mellitus with foot ulcer: Secondary | ICD-10-CM | POA: Insufficient documentation

## 2014-12-26 DIAGNOSIS — Z794 Long term (current) use of insulin: Secondary | ICD-10-CM

## 2014-12-26 DIAGNOSIS — E1122 Type 2 diabetes mellitus with diabetic chronic kidney disease: Secondary | ICD-10-CM | POA: Diagnosis present

## 2014-12-26 DIAGNOSIS — N179 Acute kidney failure, unspecified: Secondary | ICD-10-CM | POA: Diagnosis present

## 2014-12-26 DIAGNOSIS — E785 Hyperlipidemia, unspecified: Secondary | ICD-10-CM | POA: Diagnosis present

## 2014-12-26 DIAGNOSIS — L97529 Non-pressure chronic ulcer of other part of left foot with unspecified severity: Secondary | ICD-10-CM | POA: Diagnosis present

## 2014-12-26 DIAGNOSIS — L03116 Cellulitis of left lower limb: Secondary | ICD-10-CM | POA: Diagnosis present

## 2014-12-26 DIAGNOSIS — I10 Essential (primary) hypertension: Secondary | ICD-10-CM | POA: Diagnosis present

## 2014-12-26 DIAGNOSIS — M86172 Other acute osteomyelitis, left ankle and foot: Principal | ICD-10-CM | POA: Diagnosis present

## 2014-12-26 DIAGNOSIS — E1140 Type 2 diabetes mellitus with diabetic neuropathy, unspecified: Secondary | ICD-10-CM | POA: Diagnosis present

## 2014-12-26 DIAGNOSIS — E1165 Type 2 diabetes mellitus with hyperglycemia: Secondary | ICD-10-CM | POA: Diagnosis present

## 2014-12-26 DIAGNOSIS — L97509 Non-pressure chronic ulcer of other part of unspecified foot with unspecified severity: Secondary | ICD-10-CM | POA: Diagnosis present

## 2014-12-26 DIAGNOSIS — Z818 Family history of other mental and behavioral disorders: Secondary | ICD-10-CM

## 2014-12-26 DIAGNOSIS — Z887 Allergy status to serum and vaccine status: Secondary | ICD-10-CM

## 2014-12-26 DIAGNOSIS — Z833 Family history of diabetes mellitus: Secondary | ICD-10-CM

## 2014-12-26 DIAGNOSIS — K219 Gastro-esophageal reflux disease without esophagitis: Secondary | ICD-10-CM | POA: Diagnosis present

## 2014-12-26 DIAGNOSIS — Z8249 Family history of ischemic heart disease and other diseases of the circulatory system: Secondary | ICD-10-CM

## 2014-12-26 DIAGNOSIS — E782 Mixed hyperlipidemia: Secondary | ICD-10-CM | POA: Diagnosis present

## 2014-12-26 DIAGNOSIS — Z825 Family history of asthma and other chronic lower respiratory diseases: Secondary | ICD-10-CM

## 2014-12-26 DIAGNOSIS — Z87828 Personal history of other (healed) physical injury and trauma: Secondary | ICD-10-CM | POA: Diagnosis present

## 2014-12-26 DIAGNOSIS — E1169 Type 2 diabetes mellitus with other specified complication: Secondary | ICD-10-CM | POA: Diagnosis present

## 2014-12-26 DIAGNOSIS — J302 Other seasonal allergic rhinitis: Secondary | ICD-10-CM | POA: Diagnosis present

## 2014-12-26 HISTORY — DX: Essential (primary) hypertension: I10

## 2014-12-26 HISTORY — DX: Non-pressure chronic ulcer of other part of left foot with unspecified severity: L97.529

## 2014-12-26 HISTORY — DX: Type 2 diabetes mellitus with foot ulcer: E11.621

## 2014-12-26 LAB — COMPREHENSIVE METABOLIC PANEL
ALT: 23 U/L (ref 0–55)
AST (SGOT): 13 U/L (ref 10–42)
Albumin/Globulin Ratio: 0.72 Ratio (ref 0.70–1.50)
Albumin: 3 gm/dL — ABNORMAL LOW (ref 3.5–5.0)
Alkaline Phosphatase: 121 U/L (ref 40–145)
Anion Gap: 14.3 mMol/L (ref 7.0–18.0)
BUN / Creatinine Ratio: 27.6 Ratio (ref 10.0–30.0)
BUN: 42 mg/dL — ABNORMAL HIGH (ref 7–22)
Bilirubin, Total: 0.2 mg/dL (ref 0.1–1.2)
CO2: 20 mMol/L (ref 20.0–30.0)
Calcium: 9.6 mg/dL (ref 8.5–10.5)
Chloride: 105 mMol/L (ref 98–110)
Creatinine: 1.52 mg/dL — ABNORMAL HIGH (ref 0.80–1.30)
EGFR: 49 mL/min/{1.73_m2}
Globulin: 4.2 gm/dL — ABNORMAL HIGH (ref 2.0–4.0)
Glucose: 217 mg/dL — ABNORMAL HIGH (ref 70–99)
Osmolality Calc: 286 mOsm/kg (ref 275–300)
Potassium: 5.2 mMol/L (ref 3.5–5.3)
Protein, Total: 7.2 gm/dL (ref 6.0–8.3)
Sodium: 134 mMol/L — ABNORMAL LOW (ref 136–147)

## 2014-12-26 LAB — CBC AND DIFFERENTIAL
Basophils %: 0.7 % (ref 0.0–3.0)
Basophils Absolute: 0.1 10*3/uL (ref 0.0–0.3)
Eosinophils %: 2.7 % (ref 0.0–7.0)
Eosinophils Absolute: 0.2 10*3/uL (ref 0.0–0.8)
Hematocrit: 40.7 % (ref 39.0–52.5)
Hemoglobin: 13.7 gm/dL (ref 13.0–17.5)
Lymphocytes Absolute: 1.9 10*3/uL (ref 0.6–5.1)
Lymphocytes: 21.1 % (ref 15.0–46.0)
MCH: 30 pg (ref 28–35)
MCHC: 34 gm/dL (ref 31–36)
MCV: 88 fL (ref 80–100)
MPV: 7 fL (ref 6.0–10.0)
Monocytes Absolute: 0.7 10*3/uL (ref 0.1–1.7)
Monocytes: 8.5 % (ref 3.0–15.0)
Neutrophils %: 67 % (ref 42.0–78.0)
Neutrophils Absolute: 5.9 10*3/uL (ref 1.7–8.6)
PLT CT: 303 10*3/uL (ref 130–440)
RBC: 4.61 10*6/uL (ref 4.00–5.70)
RDW: 12 % (ref 10.5–14.5)
WBC: 8.8 10*3/uL (ref 4.00–11.00)

## 2014-12-26 LAB — SEDIMENTATION RATE: Sed Rate: 93 mm/hr — ABNORMAL HIGH (ref 0–20)

## 2014-12-26 MED ORDER — SODIUM CHLORIDE 0.9 % IV SOLN
INTRAVENOUS | Status: DC
Start: 2014-12-27 — End: 2014-12-27

## 2014-12-26 MED ORDER — AMPICILLIN-SULBACTAM SODIUM 3 (2-1) G IJ SOLR
INTRAMUSCULAR | Status: AC
Start: 2014-12-26 — End: ?
  Filled 2014-12-26: qty 20

## 2014-12-26 MED ORDER — INSULIN DETEMIR 100 UNIT/ML SC SOPN
30.0000 [IU] | PEN_INJECTOR | Freq: Every morning | SUBCUTANEOUS | Status: DC
Start: 2014-12-27 — End: 2014-12-29
  Administered 2014-12-27 – 2014-12-29 (×3): 30 [IU] via SUBCUTANEOUS
  Filled 2014-12-26 (×2): qty 3

## 2014-12-26 MED ORDER — VH BIO-K PLUS PROBIOTIC 50 BIL CFU CAPSULE
50.0000 | DELAYED_RELEASE_CAPSULE | ORAL | Status: DC
Start: 2014-12-27 — End: 2014-12-31
  Administered 2014-12-27 – 2014-12-31 (×5): 50 via ORAL
  Filled 2014-12-26 (×5): qty 1

## 2014-12-26 MED ORDER — FAMOTIDINE 20 MG PO TABS
20.0000 mg | ORAL_TABLET | Freq: Two times a day (BID) | ORAL | Status: DC
Start: 2014-12-27 — End: 2014-12-31
  Administered 2014-12-27 – 2014-12-31 (×9): 20 mg via ORAL
  Filled 2014-12-26 (×10): qty 1

## 2014-12-26 MED ORDER — VANCOMYCIN HCL IN DEXTROSE 1-5 GM/200ML-% IV SOLN
1000.0000 mg | Freq: Two times a day (BID) | INTRAVENOUS | Status: DC
Start: 2014-12-27 — End: 2014-12-27
  Administered 2014-12-27: 1000 mg via INTRAVENOUS
  Filled 2014-12-26: qty 1000

## 2014-12-26 MED ORDER — SODIUM CHLORIDE 0.9 % IV SOLN
3.0000 g | Freq: Once | INTRAVENOUS | Status: AC
Start: 2014-12-26 — End: 2014-12-26
  Administered 2014-12-26: 3 g via INTRAVENOUS

## 2014-12-26 MED ORDER — SODIUM CHLORIDE 0.9 % IV SOLN
INTRAVENOUS | Status: AC
Start: 2014-12-26 — End: ?
  Filled 2014-12-26: qty 100

## 2014-12-26 MED ORDER — INSULIN ASPART 100 UNIT/ML SC SOPN
2.0000 [IU] | PEN_INJECTOR | Freq: Three times a day (TID) | SUBCUTANEOUS | Status: DC | PRN
Start: 2014-12-26 — End: 2014-12-31
  Administered 2014-12-27: 2 [IU] via SUBCUTANEOUS
  Administered 2014-12-27 – 2014-12-28 (×3): 4 [IU] via SUBCUTANEOUS
  Administered 2014-12-29: 2 [IU] via SUBCUTANEOUS
  Administered 2014-12-29: 4 [IU] via SUBCUTANEOUS
  Administered 2014-12-29: 3 [IU] via SUBCUTANEOUS
  Administered 2014-12-30: 4 [IU] via SUBCUTANEOUS
  Administered 2014-12-30: 2 [IU] via SUBCUTANEOUS
  Administered 2014-12-30: 4 [IU] via SUBCUTANEOUS
  Administered 2014-12-31: 2 [IU] via SUBCUTANEOUS

## 2014-12-26 MED ORDER — INSULIN ASPART 100 UNIT/ML SC SOPN
1.0000 [IU] | PEN_INJECTOR | Freq: Three times a day (TID) | SUBCUTANEOUS | Status: DC | PRN
Start: 2014-12-26 — End: 2014-12-26

## 2014-12-26 MED ORDER — DEXTROSE 50 % IV SOLN
25.0000 mL | INTRAVENOUS | Status: DC | PRN
Start: 2014-12-26 — End: 2014-12-27

## 2014-12-26 MED ORDER — CYCLOBENZAPRINE HCL 10 MG PO TABS
10.0000 mg | ORAL_TABLET | Freq: Three times a day (TID) | ORAL | Status: DC | PRN
Start: 2014-12-26 — End: 2014-12-31

## 2014-12-26 MED ORDER — GABAPENTIN 100 MG PO CAPS
100.0000 mg | ORAL_CAPSULE | Freq: Every evening | ORAL | Status: DC
Start: 2014-12-27 — End: 2014-12-31
  Administered 2014-12-27 – 2014-12-30 (×4): 100 mg via ORAL
  Filled 2014-12-26 (×5): qty 1

## 2014-12-26 MED ORDER — ENOXAPARIN SODIUM 40 MG/0.4ML SC SOLN
40.0000 mg | SUBCUTANEOUS | Status: DC
Start: 2014-12-27 — End: 2014-12-31
  Administered 2014-12-27 – 2014-12-31 (×5): 40 mg via SUBCUTANEOUS
  Filled 2014-12-26 (×5): qty 0.4

## 2014-12-26 MED ORDER — INSULIN ASPART 100 UNIT/ML SC SOPN
1.0000 [IU] | PEN_INJECTOR | Freq: Every evening | SUBCUTANEOUS | Status: DC | PRN
Start: 2014-12-26 — End: 2014-12-31
  Administered 2014-12-27: 4 [IU] via SUBCUTANEOUS
  Administered 2014-12-28: 1 [IU] via SUBCUTANEOUS
  Administered 2014-12-29: 2 [IU] via SUBCUTANEOUS
  Administered 2014-12-30: 1 [IU] via SUBCUTANEOUS
  Filled 2014-12-26: qty 3

## 2014-12-26 MED ORDER — COLLAGENASE 250 UNIT/GM EX OINT
TOPICAL_OINTMENT | Freq: Every day | CUTANEOUS | Status: DC
Start: 2014-12-27 — End: 2014-12-31
  Filled 2014-12-26: qty 30

## 2014-12-26 MED ORDER — ACETAMINOPHEN 160 MG/5ML PO SOLN
650.0000 mg | ORAL | Status: DC | PRN
Start: 2014-12-26 — End: 2014-12-31

## 2014-12-26 MED ORDER — ATORVASTATIN CALCIUM 10 MG PO TABS
10.0000 mg | ORAL_TABLET | Freq: Every day | ORAL | Status: DC
Start: 2014-12-27 — End: 2014-12-31
  Administered 2014-12-27 – 2014-12-31 (×5): 10 mg via ORAL
  Filled 2014-12-26 (×5): qty 1

## 2014-12-26 MED ORDER — ASPIRIN 81 MG PO CHEW
81.0000 mg | CHEWABLE_TABLET | Freq: Every day | ORAL | Status: DC
Start: 2014-12-27 — End: 2014-12-31
  Administered 2014-12-27 – 2014-12-31 (×5): 81 mg via ORAL
  Filled 2014-12-26 (×5): qty 1

## 2014-12-26 MED ORDER — PIPERACILLIN-TAZOBACTAM IN DEX 3-0.375 GM/50ML IV SOLN
3.3750 g | Freq: Four times a day (QID) | INTRAVENOUS | Status: DC
Start: 2014-12-27 — End: 2014-12-29
  Administered 2014-12-27 – 2014-12-29 (×11): 3.375 g via INTRAVENOUS
  Filled 2014-12-26 (×11): qty 50

## 2014-12-26 MED ORDER — ACETAMINOPHEN 650 MG RE SUPP
650.0000 mg | RECTAL | Status: DC | PRN
Start: 2014-12-26 — End: 2014-12-31

## 2014-12-26 MED ORDER — INSULIN ASPART 100 UNIT/ML SC SOPN
1.0000 [IU] | PEN_INJECTOR | Freq: Every evening | SUBCUTANEOUS | Status: DC | PRN
Start: 2014-12-26 — End: 2014-12-26

## 2014-12-26 MED ORDER — ACETAMINOPHEN 325 MG PO TABS
650.0000 mg | ORAL_TABLET | ORAL | Status: DC | PRN
Start: 2014-12-26 — End: 2014-12-31

## 2014-12-26 MED ORDER — DEXTROSE 50 % IV SOLN
25.0000 mL | INTRAVENOUS | Status: DC | PRN
Start: 2014-12-26 — End: 2014-12-31

## 2014-12-26 NOTE — ED Notes (Signed)
Pt awake , alert and oriented. Skin warm and rdy and respirations easy on room air. IV site SL 20 G intact and asymptomatic RFA. Dry sterile dressing to left foot ulcer and slipper socks and pt ambulatory to BR , refused to use urinal or w/c to BR.

## 2014-12-26 NOTE — Consults (Incomplete)
NEW CONSULT - [facility]    Date Time: 12/26/2014 8:07 PM  Patient Name: [name], [name]  Consulting Physician: Eda Paschal, MD  Requesting Physician: Gwen Pounds,*  Primary Care Physician: Rhunette Croft, MD    Reason for Consultation: Diabetic foot ulcer    Assessment:                                                                                         Patient Active Problem List    Diagnosis Date Noted   . Acute renal failure 12/26/2014   . Diabetic ulcer of left foot associated with type 2 diabetes mellitus    . Type 2 diabetes mellitus with diabetic neuropathy 09/02/2014   . Carpal tunnel syndrome of right wrist 09/02/2014   . Nicotine abuse 09/02/2014          Recommendations and Plan:                                                           1. Draining Diabetic Right Foot Ulcer: Xray done today was not-diagnostic.  MRI will not be available until 4-5 days.  A finding of Osteomyelitis will definitely change management and is prudent to rule out.  I spoke with patient and his wife and they prefer to find out soon if there is osteomyelitis and therefore need to transfer for MRI or bone scan.    2. ARF: likely related to Bactrim.  Advised to stop Bactrim, hydrate, avoid other nephrotoxins, monitor Bun/Cr and urine output.    3. Poorly controlled DM: Patient states FS 150-200 in AM.      4. Code Status: Full      History of Presenting Illness:                                                            [name] is a 49 y.o. male who is sent by Dr. Nada Boozer (podiatry) for treatment of diabetic right foot ulcer.  The patient reports having developed an ulcer in his right foot over the first great toe 2 months ago.  This started to drain ~ 1 week ago and was associated with subjective, fever and chills.  He went to urgent care in [place] and was diagnosed with foot infection and discharged home with Keflex and bactrim.  He no longer had fever/chills.  This AM, he  was seen by Dr. Nada Boozer (podiatry) who cleaned the wound and sent him here with vial of culture from the wound drainage and to be admitted for wound infection treatment.  Patient notes that the swelling and redness has improved since he started taking Bactrim and Keflex.  He denies swelling, pain or paresthesia in this foot.  Denies chest pain, shortness of  breath, nausea, vomiting or diarrhea.  Feel well otherwise.      Past Medical History:                                                                           Past Medical History   Diagnosis Date   . Seasonal allergic rhinitis    . Diabetes mellitus    . Disc        Past Surgical History:                                                                       Past Surgical History   Procedure Laterality Date   . Hand surgery         Family History:                                                                                       Family History   Problem Relation Age of Onset   . Diabetes Mother    . Depression Sister    . COPD Sister    . Hypertension Brother        Social History:                                                                                       History   Smoking status   . Never Smoker    Smokeless tobacco   . Never Used     History   Alcohol Use No     History   Drug Use No       Code Status: No Order    Allergies:                                                                                                Allergies   Allergen Reactions   . Flu Virus Vaccine Anaphylaxis       Medications:                                                                                          (  Not in a hospital admission)         Review of Systems:                                                                            All other systems were reviewed and are negative except for as per HPI    Physical Exam:                                                                                   Patient Vitals for the past 24 hrs:   BP Temp Pulse Resp SpO2  Height Weight   12/26/14 1833 125/86 mmHg - 98 18 96 % - -   12/26/14 1737 108/80 mmHg - (!) 105 18 99 % - -   12/26/14 1623 126/85 mmHg 98.7 F (37.1 C) (!) 106 18 99 % 1.676 m (5\' 6" ) 94.348 kg (208 lb)     Body mass index is 33.59 kg/(m^2).  No intake or output data in the 24 hours ending 12/26/14 2007    General: awake, alert, oriented x 3; no acute distress.  HEENT: perrla, eomi, sclera anicteric  oropharynx clear without lesions, mucous membranes moist  Neck: supple, no lymphadenopathy, no thyromegaly, no JVD, no carotid bruits  Cardiovascular: regular rate and rhythm, no murmurs, rubs or gallops  Lungs: clear to auscultation bilaterally, without wheezing, rhonchi, or rales  Abdomen: soft, non-tender, non-distended; no palpable masses, no hepatosplenomegaly, normoactive bowel sounds, no rebound or guarding  Extremities: no clubbing, cyanosis, or edema.  There is ~ 2cm ulcerated wound over the right at the first MT joint, non draining, no obvious sign of cellulitis  Neuro: A+O x 3, cranial nerves grossly intact, strength 5/5 in upper and lower extremities, sensation intact,   Skin: no rashes or lesions noted, ulcer over the right first great toe MT joint as in Extremities Exam.  Psych: Pleasant, denies anxiety or depression        Labs and Imaging:                                                                               Results     Procedure Component Value Units Date/Time    Blood Culture - Venipuncture [id number] Collected:  12/26/14 1730    Specimen Information:  Blood from Venipuncture Updated:  12/26/14 1958    CMP [id number]  (Abnormal) Collected:  12/26/14 1714    Specimen Information:  Plasma Updated:  12/26/14 1749     Sodium 134 (L) mMol/L      Potassium 5.2 mMol/L      Chloride  105 mMol/L      CO2 20.0 mMol/L      Calcium 9.6 mg/dL      Glucose 217 (H) mg/dL      Creatinine 1.52 (H) mg/dL      BUN 42 (H) mg/dL      Protein, Total 7.2 gm/dL      Albumin 3.0 (L) gm/dL      Alkaline Phosphatase  121 U/L      ALT 23 U/L      AST (SGOT) 13 U/L      Bilirubin, Total 0.2 mg/dL      Albumin/Globulin Ratio 0.72 Ratio      Anion Gap 14.3 mMol/L      BUN/Creatinine Ratio 27.6 Ratio      EGFR 49 mL/min/1.13m2      Osmolality Calc 286 mOsm/kg      Globulin 4.2 (H) gm/dL     CBC [id number] Collected:  12/26/14 1714    Specimen Information:  Blood from Blood Updated:  12/26/14 1731     WBC 8.8 K/cmm      RBC 4.61 M/cmm      Hemoglobin 13.7 gm/dL      Hematocrit 40.7 %      MCV 88 fL      MCH 30 pg      MCHC 34 gm/dL      RDW 12.0 %      PLT CT 303 K/cmm      MPV 7.0 fL      NEUTROPHIL % 67.0 %      Lymphocytes 21.1 %      Monocytes 8.5 %      Eosinophils % 2.7 %      Basophils % 0.7 %      Neutrophils Absolute 5.9 K/cmm      Lymphocytes Absolute 1.9 K/cmm      Monocytes Absolute 0.7 K/cmm      Eosinophils Absolute 0.2 K/cmm      BASO Absolute 0.1 K/cmm           Radiology Results (24 Hour)     ** No results found for the last 24 hours. **                Signed by: Eda Paschal, MD

## 2014-12-26 NOTE — ED Notes (Signed)
Pt resting on stretcher with no change in status.

## 2014-12-26 NOTE — ED Notes (Signed)
Friend at bedside. No change.

## 2014-12-26 NOTE — Progress Notes (Signed)
Patient arrived on floor at 2330 hrs. Orientated to room, call bell in reach.   Denies any pain. MD in room now, awaiting orders.

## 2014-12-26 NOTE — ED Provider Notes (Signed)
[facility] EMERGENCY DEPARTMENT History and Physical Exam      Patient Name: [name], [name]  Encounter Date:  12/26/2014  Attending Physician: Gwen Pounds,*  Nurse Practitioner: Jeralyn Ruths, NP  PCP: Rhunette Croft, MD  Patient DOB:  1965/08/03  MRN:  [id number]  Room:  [id number]      History of Presenting Illness     Chief complaint: Skin Ulcer    HPI/ROS is limited by: none  HPI/ROS given by: patient    Location: left 1st MPJ  Duration: 2 months  Severity: moderate  Quality: non-painful diabetic ulcer  Timing: N/A  Modifying Factors: worse with wearing steel-toe boots while working  Associated Symptoms: fever 1 week ago, no fever at this time      [name] is a 49 y.o. male who presents with a diabetic foot ulcer to his left 1st MPJ, ongoing for the past 2 months. Patient has a history of diabetes and peripheral neuropathy. Patient reports ulcer began as a blister from wearing steel toe boots while working. Dr. Candis Schatz initially saw the patient and prescribed Keflex, ulcer was almost healed and reopened according to patient. Patient went to Urgent Care in [place] on 12/19/14 and was prescribed Keflex and Bactrim, feverish at that time. Patient followed-up with Dr. Candis Schatz on 12/25/14 who referred the patient to Dr. Nada Boozer, podiatrist. Patient saw Dr. Nada Boozer in the office today, 12/26/14, sent the patient to the ED for IV antibiotics and inpatient admission. Patient denies pain or recent fever.      Review of Systems     Review of Systems   Constitutional: Negative for fever, chills and malaise/fatigue.   HENT: Negative for congestion, ear pain and sore throat.    Respiratory: Negative for cough, shortness of breath and wheezing.    Cardiovascular: Negative for chest pain, palpitations and leg swelling.   Gastrointestinal: Negative for nausea, vomiting, abdominal pain, diarrhea and constipation.   Musculoskeletal: Negative for back pain, joint pain, falls and neck pain.    Skin:        Reports non-painful ulcer to left 1st MPJ for past 2 months, began as a blister from wearing steel toe boots while working.   Neurological: Negative for dizziness, loss of consciousness, weakness and headaches.       Allergies     Pt is allergic to flu virus vaccine.    Medications     No current outpatient prescriptions on file.     Past Medical History     Pt has a past medical history of Seasonal allergic rhinitis; Diabetes mellitus; Disc; Hypertension; and Diabetic ulcer of left foot (12/26/14).    Past Surgical History     Pt has past surgical history that includes Hand surgery.    Family History     The family history includes COPD in his sister; Depression in his sister; Diabetes in his mother; Hypertension in his brother.    Social History     Pt reports that he has never smoked. He has never used smokeless tobacco. He reports that he does not drink alcohol or use illicit drugs.    Physical Exam     Blood pressure 116/78, pulse 102, temperature 99 F (37.2 C), temperature source Oral, resp. rate 16, height 1.676 m, weight 94.348 kg, SpO2 98 %.    Physical Exam   Constitutional: He is oriented to person, place, and time and well-developed, well-nourished, and in no distress. No distress.   HENT:   Head:  Normocephalic and atraumatic.   Neck: Normal range of motion. Neck supple.   Cardiovascular: Normal rate, regular rhythm, normal heart sounds and intact distal pulses.    Pulmonary/Chest: Effort normal and breath sounds normal. No respiratory distress. He has no wheezes.   Abdominal: Soft. Bowel sounds are normal. There is no tenderness.   Musculoskeletal: Normal range of motion.   Neurological: He is alert and oriented to person, place, and time. Gait normal. GCS score is 15.   Skin: Skin is warm and dry.   Ulcer to left 1st MPJ, serous drainage.   Psychiatric: Mood, memory, affect and judgment normal.        Orders Placed     Orders Placed This Encounter   Procedures   . Blood Culture -  Venipuncture   . Wound Culture and Smear   . CBC   . CMP   . Sedimentation rate (ESR)   . Saline lock IV       Diagnostic Results     The results of the diagnostic studies below have been reviewed by myself:    Labs  Results     Procedure Component Value Units Date/Time    Sedimentation rate (ESR) [id number]  (Abnormal) Collected:  12/26/14 1720    Specimen Information:  Blood Updated:  12/26/14 2149     Sed Rate 93 (H) mm/hr     Wound Culture and Smear [id number] Collected:  12/26/14 1730    Specimen Information:  Wound from Ulcer Updated:  12/26/14 2116    Narrative:      ULCER L 1ST MPT  Specimen/Source: Wound/Ulcer  Collected: 12/26/2014 17:30     Status: Valued      Last Updated: 12/26/2014 21:16           (1) ULCER L 1ST MPT             Gram Stain (Final)      Few WBC's Seen      No Organisms Seen          Blood Culture - Venipuncture [id number] Collected:  12/26/14 1730    Specimen Information:  Blood from Venipuncture Updated:  12/26/14 2101    Narrative:      Specimen/Source: Blood/Venipuncture  Collected: 12/26/2014 17:30     Status: Valued      Last Updated: 12/26/2014 21:00           (1) *Specimen Blood      *Source Detail Venipuncture             Culture Result (Prelim)      Culture In Progress          CMP [id number]  (Abnormal) Collected:  12/26/14 1714    Specimen Information:  Plasma Updated:  12/26/14 1749     Sodium 134 (L) mMol/L      Potassium 5.2 mMol/L      Chloride 105 mMol/L      CO2 20.0 mMol/L      Calcium 9.6 mg/dL      Glucose 217 (H) mg/dL      Creatinine 1.52 (H) mg/dL      BUN 42 (H) mg/dL      Protein, Total 7.2 gm/dL      Albumin 3.0 (L) gm/dL      Alkaline Phosphatase 121 U/L      ALT 23 U/L      AST (SGOT) 13 U/L      Bilirubin, Total 0.2 mg/dL      Albumin/Globulin  Ratio 0.72 Ratio      Anion Gap 14.3 mMol/L      BUN/Creatinine Ratio 27.6 Ratio      EGFR 49 mL/min/1.76m2      Osmolality Calc 286 mOsm/kg      Globulin 4.2 (H) gm/dL     CBC [id number] Collected:  12/26/14 1714     Specimen Information:  Blood from Blood Updated:  12/26/14 1731     WBC 8.8 K/cmm      RBC 4.61 M/cmm      Hemoglobin 13.7 gm/dL      Hematocrit 40.7 %      MCV 88 fL      MCH 30 pg      MCHC 34 gm/dL      RDW 12.0 %      PLT CT 303 K/cmm      MPV 7.0 fL      NEUTROPHIL % 67.0 %      Lymphocytes 21.1 %      Monocytes 8.5 %      Eosinophils % 2.7 %      Basophils % 0.7 %      Neutrophils Absolute 5.9 K/cmm      Lymphocytes Absolute 1.9 K/cmm      Monocytes Absolute 0.7 K/cmm      Eosinophils Absolute 0.2 K/cmm      BASO Absolute 0.1 K/cmm           Radiologic Studies  Radiology Results (24 Hour)     ** No results found for the last 24 hours. **        12/25/2014  XR FOOT LEFT AP AND LATERAL [[id number]] (Order [id number])    Status: Final result         Study Result     Clinical History:  Reason For Exam: evaluate for osteomyelitis    Examination:  AP and lateral views of the left foot.    Comparison:  October 10, 2014.    IMPRESSION:   Moderate degenerative changes of the first MT are present. Adjacent soft tissue swelling is present. Adjacent skin  defect also noted.    There is deformity of the articular surface of the distal aspect of the first proximal phalanx. Fracture not excluded.  Further evaluation with oblique view suggested.        Trans: aa      ReadingStation:[id number]     EKG: none    Procedures     ED Course     Blood pressure 116/78, pulse 102, temperature 99 F (37.2 C), temperature source Oral, resp. rate 16, height 1.676 m, weight 94.348 kg, SpO2 98 %.    I reviewed the vital signs, nursing notes, past medical history, past surgical history, family history and social history.   I have reviewed the patient's previous charts.     O2 sat- saturation: 98 ; Oxygen use: RA Interpretation: Normal    Patient presented to the ED with left diabetic foot ulcer, sent to the ED by Dr. Nada Boozer for IV antibiotics and inpatient admission. CBC and CMP obtained, noted to have elevation in BUN/creatinine. Left  foot x-ray obtained outpatient yesterday, 12/25/14. Wound culture obtained by Dr. Nada Boozer, brought to the ED and sent to lab. Blood culture obtained. IV Unasyn given in the ED.  1800: Spoke with Dr. Benita Gutter, [facility] hospitalist, regarding patient admission. She will see the patient.  1945: Dr. Orvan Falconer, [facility] hospitalist, contacted me regarding patient admission. Advised me to contact Dr. Nada Boozer for details  regarding inpatient admission.  2000: Dr. Orvan Falconer came to the ED and spoke with the patient and Dr. Noreene Larsson. Dr. Orvan Falconer feels patient should transferred to [facility] for MRI to r/o osteomyelitis and inpatient admission. CT scan with contrast unable to be obtained due to elevation in creatinine. MRI not available at [facility] until next week.  2025: Spoke with Dr. Lucienne Minks, hospitalist at [facility], advised he will accept the patient for transfer, asked to add on ESR. Dr. Lucienne Minks asked me to speak with podiatrist on-call for [facility]. Bed Access paged podiatrist, no return of phone call. Bed Access advised they would call back with podiatry on the line.  2035: [facility] Access called and said Dr. Lucienne Minks agrees to go ahead and accept the patient for transfer, will assign bed.  Patient verbalized understanding and was agreeable to plan.     I discussed this case with the attending physician in the emergency department and they agree with the assessment and treatment plan.     Medications Given in ED:  E13-EDA13-A - MAR ACTION REPORT  (last 24 hrs)         HOTTEL, CANDACE D, RN       Medication Name Action Time Action Site Route Rate Dose Reason Comments User     ampicillin-sulbactam (UNASYN) 3 g in sodium chloride 0.9 % 100 mL IVPB 12/26/14 1736 New Bag  Intravenous 200 mL/hr 3 g   Hottel, Candace D, RN     ampicillin-sulbactam (UNASYN) 3 g in sodium chloride 0.9 % 100 mL IVPB 12/26/14 1810 Stopped  Intravenous     Hottel, Candace D, RN                Prescriptions:  Discharge Medication List as of 12/26/2014 10:55 PM                  MDM / Critical Care      Patient presented to the ED with left 1st MPJ diabetic ulcer, ongoing for the past 2 months. Failed outpatient oral antibiotic therapy. Sent to the ED by Dr. Nada Boozer, podiatry, for IV antibiotics and inpatient admission. Differential diagnoses included: diabetic foot ulcer, peripheral neuropathy, cellulitis, and osteomyelitis. Patient was ultimately transferred to [facility] for MRI to r/o osteomyelitis and inpatient admission. MRI not available at [facility].      Diagnosis / Disposition     Clinical Impression  1. Diabetic ulcer of left foot associated with type 2 diabetes mellitus        Disposition  ED Disposition     Transfer to [place] to be transferred to [facility] for inpatient admission and MRI.    Condition at disposition: Stable            Jeralyn Ruths, NP  5:35 PM      This chart was generated by an EMR and may contain errors, including typographical, or omissions not intended by the user.       Jeralyn Ruths, NP  12/27/14 0031    Gwen Pounds, MD  12/27/14 0800

## 2014-12-26 NOTE — ED Notes (Signed)
Pt remains alert and oriented , skin warm and dry and respirations easy on room air. IV site RFA asymptomatic. Pt denies any pain at present.

## 2014-12-26 NOTE — ED Notes (Signed)
VMT arrived.

## 2014-12-26 NOTE — ED Notes (Signed)
VMT eta 2145

## 2014-12-26 NOTE — ED Notes (Signed)
Per patient: sent by Dr Nada Boozer with diabetic ulcer left foot. Non painful.

## 2014-12-26 NOTE — H&P (Addendum)
History of Present Illness  12/26/2014   Chief complaint : Left foot ulcer    HPI:    [name] is a 49 y.o.  male with history of hypertension, diabetes mellitus, GERD, diabetic neuropathy got transferred from [facility] with possible diabetic foot infection. Patient mentioned that also started months ago from wearing steel toe boot at work 2 months ago started as a blister bottom of  the left first metatarsophalangeal joint by discharge went to primary care doctor's office cord Keflex got better but patient with pustular discharge. He presented to urgent care on 12/19/2014 started on Keflex and Bactrim left swelling got better however discharge persists. Went to podiatry doctor on 12/31/2014 Dr. Su Hoff) recommended transfer to hospital for IV antibiotic and podiatry consult. Patient denies any fever nausea vomiting abdominal pain chest pain or diarrhea.  Patient is hemodynamically stable        Past Medical History   Past Medical History   Diagnosis Date   . Seasonal allergic rhinitis    . Diabetes mellitus    . Disc    . Hypertension    . Diabetic ulcer of left foot 12/26/14           Past Surgical History  Past Surgical History   Procedure Laterality Date   . Hand surgery         Allergies  Allergies   Allergen Reactions   . Flu Virus Vaccine Anaphylaxis       Prior to Admission Medications  Current Facility-Administered Medications   Medication Dose Route Frequency Provider Last Rate Last Dose   . [START ON 12/27/2014] 0.9%  NaCl infusion   Intravenous Continuous Guy Begin A, MD       . dextrose 50 % bolus 25 mL  25 mL Intravenous PRN Guy Begin A, MD       . insulin aspart (NovoLOG) injection pen 1-4 Units  1-4 Units Subcutaneous QHS PRN Guy Begin A, MD       . insulin aspart (NovoLOG) injection pen 1-8 Units  1-8 Units Subcutaneous TID AC PRN Princella Pellegrini, MD       . Derrill Memo ON 12/27/2014] lactobacillus species (BIO-K PLUS) capsule 50 Billion CFU  50  Billion CFU Oral Q24H Guy Begin A, MD       . Derrill Memo ON 12/27/2014] piperacillin-tazobactam (ZOSYN) IVPB 3.375 g  3.375 g Intravenous Q6H Princella Pellegrini, MD       . Derrill Memo ON 12/27/2014] vancomycin (VANCOCIN) IVPB 1,000 mg  1,000 mg Intravenous Q12H Princella Pellegrini, MD             Social History  Social History   Substance Use Topics   . Smoking status: Never Smoker    . Smokeless tobacco: Never Used   . Alcohol Use: No       Family History  Family history was obtained and was non contributory for cardiac history, cancer history.     Review of Systems  Ten completed review of systems including eyes, ENT, cardiovascular, respiratory, gastrointestinal, genitourinary, psychiatric, neurologic, integumentary, allergic/hematology,  are reviewed and found unremarkable except pertinent positives mentioned in the history of present illness and past medical history.       Physical Exam  BP 135/59 mmHg  Pulse 102  Temp(Src) 98.2 F (36.8 C) (Oral)  Resp 16  Ht 1.676 m (5' 5.98")  Wt 92.715 kg (204 lb 6.4 oz)  BMI 33.01 kg/m2  SpO2 99%  No intake or output  data in the 24 hours ending 12/26/14 2349  Physical Exam    1) General appearance:  well developed,well nourished, in no apparent acute cardiorespiratory distress.     2) HEENT: Head is atraumatic and normocephalic. Pupils are equally reactive to light and accommodation. Extraocular muscles are intact. Patient has intact external auditory canal. No abnormal lesions or bleeding from nose. Oral mucosa moist with no pharyngeal congestion.     3) Neck: Supple. Trachea is central, no JVD or carotid bruit.     4) Chest: Clear to auscultation bilaterally, no wheezing     6) CVS: The S1, S2 normal. Regular rate and rhythm.No murmur    7) Abdomen:  soft, non tender, non distended, no palpable mass. Bowel sounds audible bowel.     8) Musculoskeletal: Patient has 5/5 motor strength in bilateral upper extremities as well as bilateral LE. No pitting edema in  bilateral lower extremities. Bottom of left first metatarsophalangeal joint small ulcer with pustular discharge with surrounding erythema nontender.    9) Neurological: Cranial nerves II-XII intact. Grossly non focal    10) Psychiatric: Alert and oriented x 3. Mood is appropriate.     11) Integumentary: warm with normal skin turgor, no rash    12) Lymphatics: No lymphadenopathy in axillary, cervial and inguinal area.       Labs    Labs Reviewed - No data to display    Assessment and Plan    1. Diabetic foot infection with high ESR which failed outpatient antibiotic treatment  Patient is not septic  We'll get MRI left foot to rule out osteomyelitis  We'll continue broad-spectrum IV antibiotic with vancomycin and zosyn  Podiatry consult in a.m.  2. AK likely secondary to prerenal azotemia  We will continue IV fluid with recheck kidney function in a.m.  3. Diabetes mellitus  We'll hold metformin for elevated creatinine  We'll continue long-acting and short-acting insulin sliding scale  4. Diabetic neuropathy continue gabapentin   5. Hypertension we'll hold lisinopril for now  Restart in a.m. if kidney function got improved on IV hydration        CODE Status : Full code    GI prophylaxis Pepcid  DVT prophylaxis Lovenox      Princella Pellegrini  12/26/2014    11:49 PM

## 2014-12-26 NOTE — ED Notes (Addendum)
Jeani Hawking from [facility] called with the bed assignment, Room 407. Call report to # [phone]. Dr. Corbin Ade is the accepting physician.

## 2014-12-26 NOTE — ED Notes (Signed)
Dr. Orvan Falconer at bedside for admission evaluation

## 2014-12-26 NOTE — Progress Notes (Signed)
MD notified of patient on unit.

## 2014-12-27 ENCOUNTER — Inpatient Hospital Stay: Payer: BC Managed Care – PPO

## 2014-12-27 DIAGNOSIS — Z87828 Personal history of other (healed) physical injury and trauma: Secondary | ICD-10-CM | POA: Diagnosis present

## 2014-12-27 DIAGNOSIS — I10 Essential (primary) hypertension: Secondary | ICD-10-CM | POA: Diagnosis present

## 2014-12-27 DIAGNOSIS — E782 Mixed hyperlipidemia: Secondary | ICD-10-CM | POA: Diagnosis present

## 2014-12-27 LAB — VH DEXTROSE STICK GLUCOSE
Glucose POCT: 169 mg/dL — ABNORMAL HIGH (ref 70–99)
Glucose POCT: 242 mg/dL — ABNORMAL HIGH (ref 70–99)
Glucose POCT: 213 mg/dL — ABNORMAL HIGH (ref 70–99)
Glucose POCT: 271 mg/dL — ABNORMAL HIGH (ref 70–99)

## 2014-12-27 MED ORDER — VANCOMYCIN HCL IN DEXTROSE 1-5 GM/200ML-% IV SOLN
1000.0000 mg | Freq: Two times a day (BID) | INTRAVENOUS | Status: DC
Start: 2014-12-27 — End: 2014-12-29
  Administered 2014-12-27 – 2014-12-29 (×4): 1000 mg via INTRAVENOUS
  Filled 2014-12-27 (×4): qty 1000

## 2014-12-27 MED ORDER — VH VANCOMYCIN THERAPY PLACEHOLDER
Status: DC
Start: 2014-12-27 — End: 2014-12-29

## 2014-12-27 MED ORDER — GADOBUTROL 1 MMOL/ML IV SOLN
10.0000 mL | Freq: Once | INTRAVENOUS | Status: AC | PRN
Start: 2014-12-27 — End: 2014-12-27
  Administered 2014-12-27: 10 mmol via INTRAVENOUS

## 2014-12-27 NOTE — Progress Notes (Signed)
Pt up ad lib  Walked in room/ hall/ sat in chair  Pt. Denied pain.  Dressings are dry and intact.  IV intact  Pt is voiding WNL in the bathroom.  Neuro-vascular assessment is intact.  Vital  signs reviewed:    Temp: 97.9 F (36.6 C), Heart Rate: 87, Resp Rate: 16, BP: 140/67 mmHg    Report given to accepting RN     End of shift.

## 2014-12-27 NOTE — Plan of Care (Signed)
Problem: Safety  Goal: Patient will be free from injury during hospitalization  Outcome: Progressing    Problem: Pain  Goal: Patient's pain/discomfort is manageable  Outcome: Progressing  Denies pain.

## 2014-12-27 NOTE — Progress Notes (Signed)
MEDICINE PROGRESS NOTE    Date Time: 12/27/2014 10:10 AM  Patient Name: [name]  Attending Physician: Timoteo Expose, MD    Assessment:   Principal Problem:    Diabetic ulcer of left foot associated with type 2 diabetes mellitus  Active Problems:    Type 2 diabetes mellitus with diabetic neuropathy    Diabetes mellitus with foot ulcer    HTN (hypertension)    AKI (acute kidney injury)    Hyperlipemia    Admitted on transfer 11/11 from [facility] with a several month history of left diabetic foot ulcer. Wound culture as an outpatient several months ago grew MSSA. He's been on Bactrim. Had it debrided recently by Dr. Nada Boozer. ESR significant  elevated. X-ray with soft tissue swelling.    Hemodynamically stable overnight.  wound culture pending. MRI pending sugars recently controlled.      Plan:   1. Continue empiric antibiotics with vancomycin and Zosyn. Follow wound cultures. Expect will be able to narrow coverage tomorrow based on culture results.  2. Continue local wound care per podiatry  3. Await MRI results  4. I spoke with Dr. Evette Doffing and he is graciously agreed to see the patient in podiatry consultation for consideration of further surgical intervention.  5. Continue IV hydration today and monitor renal function  6. Continue basal insulin and monitor sugars. Carotid supplemental NovoLog coverage as needed  7. Continue atorvastatin for lipids  8. Lovenox for DVT prophylaxis    Time with patient 42 minutes. Greater than 50% time in counseling and coordination of care. Back flexion medical decision making related to high-risk infection requiring IV antibiotics potential surgical intervention, consultation and high risk of morbidity and ongoing complications      Subjective     CC: Diabetic ulcer of left foot associated with type 2 diabetes mellitus    HPI/Subjective: No complaints. Denies of foot pain, chest pains or breath or abdominal pain    Review of Systems:     Review of systems as noted in HPI. Full  12 point ROS otherwise negative.    Physical Exam:   Temp:  [97.6 F (36.4 C)-99 F (37.2 C)] 97.6 F (36.4 C)  Heart Rate:  [87-106] 92  Resp Rate:  [13-18] 15  BP: (105-135)/(59-86) 116/68 mmHg    General: awake, alert, no acute distress.  EYES:  perrla, eomi, sclera anicteric   HENT:  Head atraumatic, oropharynx clear without lesions, mucous membranes moist  Neck: supple, no lymphadenopathy, no thyromegaly, no JVD, no carotid bruits  Cardiovascular: regular rate and rhythm,normal S1,S2 no murmurs, rubs or gallops, normal palpation  Lungs: clear to auscultation bilaterally, without wheezing, rhonchi, or rales, nomal palpation, normal respiratory effort  Abdomen: soft, non-tender, non-distended; no palpable masses, no hepatosplenomegaly, normoactive bowel sounds,  Extremities: no clubbing, cyanosis, or edema, normal flexion and extension, ulceration over the MCP left great toe with minimal pustular drainage. No surrounding erythema or edema Pulses palpable in feet  Neuro: cranial nervesII-XII grossly intact, strength 5/5 in upper and lower extremities, decreased sensation in a stocking distribution, mental status intact,  Skin: warm and dry, no rashes or lesions noted  Psychiatric:   normal affect and orientation    Meds:     Current Facility-Administered Medications   Medication Dose Route Frequency   . aspirin  81 mg Oral Daily   . atorvastatin  10 mg Oral Daily   . collagenase   Topical Daily   . enoxaparin  40 mg Subcutaneous Q24H   .  famotidine  20 mg Oral Q12H SCH   . gabapentin  100 mg Oral QHS   . insulin detemir  30 Units Subcutaneous QAM   . lactobacillus species  50 Billion CFU Oral Q24H   . piperacillin-tazobactam  3.375 g Intravenous Q6H   . vancomycin  1,000 mg Intravenous Q12H   . vancomycin therapy placeholder   Does not apply See Admin Instructions     acetaminophen **OR** acetaminophen **OR** acetaminophen, cyclobenzaprine, dextrose, insulin aspart, insulin aspart  . sodium chloride 100 mL/hr  at 12/27/14 0450       Labs:     Labs (last 72 hours):      Recent Labs  Lab 12/26/14  1714   WBC 8.8   HEMOGLOBIN 13.7   HEMATOCRIT 40.7   PLT CT 303            Recent Labs  Lab 12/26/14  1714   SODIUM 134*   POTASSIUM 5.2   CHLORIDE 105   CO2 20.0   BUN 42*   CREATININE 1.52*   CALCIUM 9.6   ALBUMIN 3.0*   PROTEIN, TOTAL 7.2   BILIRUBIN, TOTAL 0.2   ALKALINE PHOSPHATASE 121   ALT 23   AST (SGOT) 13   GLUCOSE 217*                     Radiology:   No results found.    Imaging reviewed.      Signed by: Charletta Cousin, MD

## 2014-12-27 NOTE — Consults (Signed)
CONSULTATION    Date Time: 12/27/2014 11:33 AM  Patient Name: [name]  MRN#: [id number]  DOB: 03-Jul-1965  Requesting Physician: Timoteo Expose, MD      Reason for Consultation:   Diabetic foot infection with ulcer right foot     Assessment:   Chronic diabetic ulcer right foot.  Cellutlitis right foot improving with IV abx.  DM-2 with neuropathy    Plan:   Consult complete.  Thank you.  MRI has been ordered, awaiting results.  Osteomyelitis is unlikely, if MRI is positive would recommend bone biopsy for appropriate abx.    WIll start on Santyl ointment daily.  WIll follow with you.    History:   [name] is a 49 y.o. male who presents to the hospital on 12/26/2014 with a chronic diabetic ulcer of the right foot.  He states that he has issues in the past with this area because of working in [employer] work boots.  He was admitted to the hospital in [facility] yesterday.  The wound was debrided by Dr. Nada Boozer who then referred him here for MRI evaluation.  History of MSSA from a previous right foot wound.     Past Medical History:     Past Medical History   Diagnosis Date   . Seasonal allergic rhinitis    . Diabetes mellitus    . Disc    . Hypertension    . Diabetic ulcer of left foot 12/26/14       Past Surgical History:     Past Surgical History   Procedure Laterality Date   . Hand surgery         Family History:     Family History   Problem Relation Age of Onset   . Diabetes Mother    . Depression Sister    . COPD Sister    . Hypertension Brother        Social History:     Social History     Social History   . Marital Status: Single     Spouse Name: N/A   . Number of Children: N/A   . Years of Education: N/A     Social History Main Topics   . Smoking status: Never Smoker    . Smokeless tobacco: Never Used   . Alcohol Use: No   . Drug Use: No   . Sexual Activity: Not on file     Other Topics Concern   . Not on file     Social History Narrative       Allergies:     Allergies   Allergen Reactions   . Flu Virus  Vaccine Anaphylaxis       Medications:     Current Facility-Administered Medications   Medication Dose Route Frequency   . aspirin  81 mg Oral Daily   . atorvastatin  10 mg Oral Daily   . collagenase   Topical Daily   . enoxaparin  40 mg Subcutaneous Q24H   . famotidine  20 mg Oral Q12H SCH   . gabapentin  100 mg Oral QHS   . insulin detemir  30 Units Subcutaneous QAM   . lactobacillus species  50 Billion CFU Oral Q24H   . piperacillin-tazobactam  3.375 g Intravenous Q6H   . vancomycin  1,000 mg Intravenous Q12H   . vancomycin therapy placeholder   Does not apply See Admin Instructions       Review of Systems:   A comprehensive review  of systems was: History obtained from the patient  Endocrine ROS: negative  Respiratory ROS: negative  Cardiovascular ROS: negative  Gastrointestinal ROS: no abdominal pain, change in bowel habits, or black or bloody stools  Musculoskeletal ROS: negative  Neurological ROS: negative  Dermatological ROS: negative    Physical Exam:     Filed Vitals:    12/27/14 0716   BP: 116/68   Pulse: 92   Temp: 97.6 F (36.4 C)   Resp: 15   SpO2: 97%       Intake and Output Summary (Last 24 hours) at Date Time    Intake/Output Summary (Last 24 hours) at 12/27/14 1133  Last data filed at 12/27/14 [id number]   Gross per 24 hour   Intake   1430 ml   Output      0 ml   Net   1430 ml       General appearance - alert, well appearing, and in no distress  Musculoskeletal - no joint tenderness, deformity or swelling  Extremities - peripheral pulses normal, no pedal edema, no clubbing or cyanosis  THere is a 1.4cm x 1.4cm full thickness ulceration at the medial 1st MPJ with mild surrounding erythema and warmth.  Clear drainage.  No gross purulence.    Labs Reviewed:     Results     Procedure Component Value Units Date/Time    Dextrose Stick Glucose [id number]  (Abnormal) Collected:  12/27/14 0715    Specimen Information:  Blood Updated:  12/27/14 0731     Glucose, POCT 169 (H) mg/dL           Recent BMP   Recent  Labs      12/26/14   1714   GLUCOSE  217*   BUN  42*   CREATININE  1.52*   CALCIUM  9.6   SODIUM  134*   POTASSIUM  5.2   CHLORIDE  105   CO2  20.0     Recent CBC WITH DIFF   Recent Labs      12/26/14   1714   RBC  4.61   HEMOGLOBIN  13.7   HEMATOCRIT  40.7   MCV  88   MCHC  34   RDW  12.0   MPV  7.0       Rads:   Radiological Procedure reviewed.     Signed by: Janora Norlander, DPM

## 2014-12-27 NOTE — Progress Notes (Signed)
Pharmacy Vancomycin Dosing Consult Note  [name]    Assessment:   1. Day one vanco/zosyn for diabetic ulcer L foot  2. Crclr 61.33ml/min     Plan:   1. vanco 1g IV q12h, trough goal 10-15 due 11/14 1400  2. Zosyn 3.375g IV q6h  3. Pharmacy will follow the patient's renal function, vancomycin levels and dosing     Indication: diabetic ulcer L foot, ARF  Hx:   DM, HTN    Age: 49 y.o.  Height: 1.676 m (5' 5.98")  Weight:  92.715 kg (204 lb 6.4 oz)  IBW: 63.8 kg  DW: 75.4 kg    Tmax 99.0    SCr 1.52    BUN 42    WBC 8.8    Hgb 13.7    Hct 40.7    Renal meds:  Enoxaparin, famotidine, gabapentin, zosyn, vancomycin     Pharmacokinetics:   t50: 12.6 hrs         Kel: 0.055 hr-1        Vd: 52.8 L         Expected Trough: 15 mg/L                Recent Labs  Lab 12/26/14  1714   CREATININE 1.52*   BUN 42*       Recent Labs  Lab 12/26/14  1714   WBC 8.8     Temp (24hrs), Avg:98.6 F (37 C), Min:98.2 F (36.8 C), Max:99 F (37.2 C)      Cultures:   Date Source Organism Sensitivities Resistance

## 2014-12-27 NOTE — Plan of Care (Signed)
Patient progressing with care plan.  Understands plan for stay with treatment of diabetic foot ulcer.

## 2014-12-28 LAB — CBC AND DIFFERENTIAL
Basophils %: 0.9 % (ref 0.0–3.0)
Basophils Absolute: 0.1 10*3/uL (ref 0.0–0.3)
Eosinophils %: 2.5 % (ref 0.0–7.0)
Eosinophils Absolute: 0.2 10*3/uL (ref 0.0–0.8)
Hematocrit: 40.9 % (ref 39.0–52.5)
Hemoglobin: 13.8 gm/dL (ref 13.0–17.5)
Lymphocytes Absolute: 2.1 10*3/uL (ref 0.6–5.1)
Lymphocytes: 24.6 % (ref 15.0–46.0)
MCH: 30 pg (ref 28–35)
MCHC: 34 gm/dL (ref 32–36)
MCV: 90 fL (ref 80–100)
MPV: 7.7 fL (ref 6.0–10.0)
Monocytes Absolute: 0.6 10*3/uL (ref 0.1–1.7)
Monocytes: 7.4 % (ref 3.0–15.0)
Neutrophils %: 64.7 % (ref 42.0–78.0)
Neutrophils Absolute: 5.5 10*3/uL (ref 1.7–8.6)
PLT CT: 289 10*3/uL (ref 130–440)
RBC: 4.53 10*6/uL (ref 4.00–5.70)
RDW: 12.5 % (ref 11.0–14.0)
WBC: 8.5 10*3/uL (ref 4.0–11.0)

## 2014-12-28 LAB — VH DEXTROSE STICK GLUCOSE
Glucose POCT: 149 mg/dL — ABNORMAL HIGH (ref 70–99)
Glucose POCT: 210 mg/dL — ABNORMAL HIGH (ref 70–99)
Glucose POCT: 248 mg/dL — ABNORMAL HIGH (ref 70–99)
Glucose POCT: 170 mg/dL — ABNORMAL HIGH (ref 70–99)

## 2014-12-28 LAB — BASIC METABOLIC PANEL
Anion Gap: 13.1 mMol/L (ref 7.0–18.0)
BUN / Creatinine Ratio: 22.6 Ratio (ref 10.0–30.0)
BUN: 24 mg/dL — ABNORMAL HIGH (ref 7–22)
CO2: 25.5 mMol/L (ref 20.0–30.0)
Calcium: 9.3 mg/dL (ref 8.5–10.5)
Chloride: 103 mMol/L (ref 98–110)
Creatinine: 1.06 mg/dL (ref 0.80–1.30)
EGFR: >60 mL/min/{1.73_m2}
Glucose: 168 mg/dL — ABNORMAL HIGH (ref 70–99)
Osmolality Calc: 282 mOsm/kg (ref 275–300)
Potassium: 4.6 mMol/L (ref 3.5–5.3)
Sodium: 137 mMol/L (ref 136–147)

## 2014-12-28 NOTE — Progress Notes (Signed)
meds per order with no s\s adverse reaction noted

## 2014-12-28 NOTE — Progress Notes (Signed)
MEDICINE PROGRESS NOTE    Date Time: 12/28/2014 11:06 AM  Patient Name: [name]  Attending Physician: Timoteo Expose, MD    Assessment:   Principal Problem:    Diabetic ulcer of left foot associated with type 2 diabetes mellitus  Active Problems:    Osteomyelitis left foot    Type 2 diabetes mellitus with diabetic neuropathy    Diabetes mellitus with foot ulcer    HTN (hypertension)    AKI (acute kidney injury)    Hyperlipemia    Admitted on transfer 11/11 from [facility] with a several month history of left diabetic foot ulcer. Wound culture as an outpatient several months ago grew MSSA. He's been on Bactrim. Had it debrided recently by Dr. Nada Boozer. ESR significant  elevated. X-ray with soft tissue swelling.    Hemodynamically stable overnight.  wound culture negative MRI does suggest osteomyelitis underlying his ulcer. Dr. Autumn Patty help appreciated. Creatinine improved.      Plan:   1. Continue empiric antibiotics with vancomycin and Zosyn. Follow wound cultures.  Will likely need six-week course of IV antibiotics for osteomyelitis  2. Awaiting further evaluation from podiatry. Will need surgical intervention for foot abscess and osteomyelitis  3. Discontinue IV hydration. Monitor creatinine  4. Continue basal insulin and monitor sugars. NovoLog supplemental  coverage as needed  5. Continue atorvastatin for lipids  6. Lovenox for DVT prophylaxis          Subjective     CC: Diabetic ulcer of left foot associated with type 2 diabetes mellitus    HPI/Subjective: No complaints. Denies of foot pain, chest pains or breath or abdominal pain    Review of Systems:     Review of systems as noted in HPI. Full 12 point ROS otherwise negative.    Physical Exam:   Temp:  [97.6 F (36.4 C)-97.9 F (36.6 C)] 97.6 F (36.4 C)  Heart Rate:  [83-87] 84  Resp Rate:  [16-18] 18  BP: (117-140)/(67-74) 117/74 mmHg    General: awake, alert, no acute distress.  EYES:  perrla, eomi, sclera anicteric   HENT:  Head atraumatic,  oropharynx clear without lesions, mucous membranes moist  Neck: supple, no lymphadenopathy, no thyromegaly, no JVD, no carotid bruits  Cardiovascular: regular rate and rhythm,normal S1,S2 no murmurs, rubs or gallops, normal palpation  Lungs: clear to auscultation bilaterally, without wheezing, rhonchi, or rales, nomal palpation, normal respiratory effort  Abdomen: soft, non-tender, non-distended; no palpable masses, no hepatosplenomegaly, normoactive bowel sounds,  Extremities: no clubbing, cyanosis, or edema, normal flexion and extension, ulceration over the MCP left great toe with minimal pustular drainage. No surrounding erythema or edema Pulses palpable in feet  Neuro: cranial nervesII-XII grossly intact, strength 5/5 in upper and lower extremities, decreased sensation in a stocking distribution, mental status intact,  Skin: warm and dry, no rashes or lesions noted  Psychiatric:   normal affect and orientation    Meds:     Current Facility-Administered Medications   Medication Dose Route Frequency   . aspirin  81 mg Oral Daily   . atorvastatin  10 mg Oral Daily   . collagenase   Topical Daily   . enoxaparin  40 mg Subcutaneous Q24H   . famotidine  20 mg Oral Q12H SCH   . gabapentin  100 mg Oral QHS   . insulin detemir  30 Units Subcutaneous QAM   . lactobacillus species  50 Billion CFU Oral Q24H   . piperacillin-tazobactam  3.375 g Intravenous Q6H   .  vancomycin  1,000 mg Intravenous Q12H   . vancomycin therapy placeholder   Does not apply See Admin Instructions     acetaminophen **OR** acetaminophen **OR** acetaminophen, cyclobenzaprine, dextrose, insulin aspart, insulin aspart       Labs:     Labs (last 72 hours):      Recent Labs  Lab 12/28/14  0732 12/26/14  1714   WBC 8.5 8.8   HEMOGLOBIN 13.8 13.7   HEMATOCRIT 40.9 40.7   PLT CT 289 303            Recent Labs  Lab 12/28/14  0732 12/26/14  1714   SODIUM 137 134*   POTASSIUM 4.6 5.2   CHLORIDE 103 105   CO2 25.5 20.0   BUN 24* 42*   CREATININE 1.06 1.52*    CALCIUM 9.3 9.6   ALBUMIN  --  3.0*   PROTEIN, TOTAL  --  7.2   BILIRUBIN, TOTAL  --  0.2   ALKALINE PHOSPHATASE  --  121   ALT  --  23   AST (SGOT)  --  13   GLUCOSE 168* 217*                     Radiology:   Mri Foot Left W Wo Contrast    12/28/2014  Soft tissue ulceration of the medial aspect of the first metatarsophalangeal joint. Adjacent cellulitis and tiny 3 mm fluid collection suggesting small abscess in the plantar soft tissues. Bone marrow signal changes in the distal first metatarsal and metatarsal head and also first proximal phalanx, consistent with osteomyelitis. ReadingStation:[id number]      Imaging reviewed.      Signed by: Charletta Cousin, MD

## 2014-12-28 NOTE — Plan of Care (Signed)
Problem: Pain  Goal: Patient's pain/discomfort is manageable  Outcome: Progressing    Problem: Infection/Potential for Infection  Goal: Free from infection  Outcome: Progressing

## 2014-12-28 NOTE — Progress Notes (Signed)
Pt is up ad lib in room, receiving IV ABX  Pt denies pain.  Dressings are dry and intact, changed today  IV intact  Pt is voiding WNL in the urinal  Neuro-vascular assessment is intact.  Vital  signs reviewed:    Temp: 98.1 F (36.7 C), Heart Rate: 89, Resp Rate: 15, BP: 124/69 mmHg    Report given to accepting RN     End of shift.

## 2014-12-28 NOTE — Plan of Care (Signed)
Assumed care of pt, assessment complete, pt denies any needs at this time.    Dressings intact, IV intact.    Reviewed plan of care for the day:   Rest in room, up ad lib   Pain control, pt will use white board to keep track of medication times.   Temp: 97.6 F (36.4 C), Heart Rate: 84, Resp Rate: 18, BP: 117/74 mmHg       Will continue to monitor on rounds.

## 2014-12-28 NOTE — Progress Notes (Signed)
Pharmacy Vancomycin Dosing Consult Note  [name]    Assessment:   1. Day #2 vancomycin (+zosyn) for diabetic ulcer L foot. MD note today suggests osteomyelitis and a 6 week course of therapy planned. (pending cultures) Trough goal will now be 15-20. SrCr today was down from admission to 1.06. Afebrile.     Plan:   1. Continue Vanco 1g IV q12h for now, trough goal 15-20. Sr Cr improved. Will order trough for tomorrow to ensure within range and adjust if necessary.  2. Pharmacy will follow the patient's renal function, vancomycin levels and dosing     Indication: diabetic ulcer L foot/Osteomyelitis    Age: 49 y.o.  Height: 1.676 m (5' 5.98")  Weight:  92.715 kg (204 lb 6.4 oz)  IBW: 63.8 kg  DW: 75.4 kg        Renal meds:  Enoxaparin, famotidine, gabapentin, zosyn, vancomycin     Pharmacokinetics: from 11/12   t50: 12.6 hrs         Kel: 0.055 hr-1        Vd: 52.8 L         Expected Trough: 15 mg/L                Recent Labs  Lab 12/28/14  0732 12/26/14  1714   CREATININE 1.06 1.52*   BUN 24* 42*       Recent Labs  Lab 12/28/14  0732 12/26/14  1714   WBC 8.5 8.8     Temp (24hrs), Avg:97.7 F (36.5 C), Min:97.6 F (36.4 C), Max:97.9 F (36.6 C)      Cultures:   Date Source Organism Sensitivities Resistance   11/11 Wound/ulcer No growth final     11/11 Blood  ngtd

## 2014-12-29 LAB — CBC AND DIFFERENTIAL
Basophils %: 1 % (ref 0.0–3.0)
Basophils Absolute: 0.1 10*3/uL (ref 0.0–0.3)
Eosinophils %: 2.8 % (ref 0.0–7.0)
Eosinophils Absolute: 0.2 10*3/uL (ref 0.0–0.8)
Hematocrit: 39.2 % (ref 39.0–52.5)
Hemoglobin: 13.5 gm/dL (ref 13.0–17.5)
Lymphocytes Absolute: 2.5 10*3/uL (ref 0.6–5.1)
Lymphocytes: 28 % (ref 15.0–46.0)
MCH: 31 pg (ref 28–35)
MCHC: 35 gm/dL (ref 32–36)
MCV: 90 fL (ref 80–100)
MPV: 8 fL (ref 6.0–10.0)
Monocytes Absolute: 0.7 10*3/uL (ref 0.1–1.7)
Monocytes: 7.8 % (ref 3.0–15.0)
Neutrophils %: 60.5 % (ref 42.0–78.0)
Neutrophils Absolute: 5.4 10*3/uL (ref 1.7–8.6)
PLT CT: 284 10*3/uL (ref 130–440)
RBC: 4.36 10*6/uL (ref 4.00–5.70)
RDW: 12.4 % (ref 11.0–14.0)
WBC: 8.9 10*3/uL (ref 4.0–11.0)

## 2014-12-29 LAB — BASIC METABOLIC PANEL
Anion Gap: 12.4 mMol/L (ref 7.0–18.0)
BUN / Creatinine Ratio: 20.2 Ratio (ref 10.0–30.0)
BUN: 21 mg/dL (ref 7–22)
CO2: 27.1 mMol/L (ref 20.0–30.0)
Calcium: 9 mg/dL (ref 8.5–10.5)
Chloride: 102 mMol/L (ref 98–110)
Creatinine: 1.04 mg/dL (ref 0.80–1.30)
EGFR: >60 mL/min/{1.73_m2}
Glucose: 144 mg/dL — ABNORMAL HIGH (ref 70–99)
Osmolality Calc: 279 mOsm/kg (ref 275–300)
Potassium: 4.5 mMol/L (ref 3.5–5.3)
Sodium: 137 mMol/L (ref 136–147)

## 2014-12-29 LAB — VH DEXTROSE STICK GLUCOSE
Glucose POCT: 153 mg/dL — ABNORMAL HIGH (ref 70–99)
Glucose POCT: 233 mg/dL — ABNORMAL HIGH (ref 70–99)
Glucose POCT: 245 mg/dL — ABNORMAL HIGH (ref 70–99)
Glucose POCT: 233 mg/dL — ABNORMAL HIGH (ref 70–99)

## 2014-12-29 MED ORDER — INSULIN DETEMIR 100 UNIT/ML SC SOPN
20.0000 [IU] | PEN_INJECTOR | Freq: Every morning | SUBCUTANEOUS | Status: DC
Start: 2014-12-30 — End: 2014-12-31
  Administered 2014-12-30 – 2014-12-31 (×2): 20 [IU] via SUBCUTANEOUS
  Filled 2014-12-29: qty 3

## 2014-12-29 MED ORDER — SODIUM CHLORIDE 0.9 % IV MBP
1.0000 g | Freq: Three times a day (TID) | INTRAVENOUS | Status: DC
Start: 2014-12-29 — End: 2014-12-31
  Administered 2014-12-29 – 2014-12-31 (×7): 1 g via INTRAVENOUS
  Filled 2014-12-29 (×2): qty 100
  Filled 2014-12-29: qty 1000
  Filled 2014-12-29 (×2): qty 100
  Filled 2014-12-29 (×2): qty 1000
  Filled 2014-12-29: qty 100
  Filled 2014-12-29: qty 1000
  Filled 2014-12-29: qty 100
  Filled 2014-12-29 (×2): qty 1000
  Filled 2014-12-29: qty 100
  Filled 2014-12-29: qty 1000

## 2014-12-29 MED ORDER — INSULIN ASPART 100 UNIT/ML SC SOPN
6.0000 [IU] | PEN_INJECTOR | Freq: Three times a day (TID) | SUBCUTANEOUS | Status: DC
Start: 2014-12-29 — End: 2014-12-30
  Administered 2014-12-29 – 2014-12-30 (×2): 6 [IU] via SUBCUTANEOUS

## 2014-12-29 MED ORDER — DEXTROSE 50 % IV SOLN
25.0000 mL | INTRAVENOUS | Status: DC | PRN
Start: 2014-12-29 — End: 2014-12-31

## 2014-12-29 NOTE — Plan of Care (Signed)
Problem: Safety  Goal: Patient will be free from injury during hospitalization  Outcome: Progressing    Problem: Pain  Goal: Patient's pain/discomfort is manageable  Outcome: Progressing

## 2014-12-29 NOTE — Plan of Care (Signed)
Goals are progressing. Assessment and neurovascular completed. Dressing clean and dry. Will continue to monitor.

## 2014-12-29 NOTE — UM Notes (Addendum)
CC- left foot ulcer    Per h/p-"49 y.o. male with history of hypertension, diabetes mellitus, GERD, diabetic neuropathy got transferred from [facility] with possible diabetic foot infection. Patient mentioned that also started months ago from wearing steel toe boot at work 2 months ago started as a blister bottom of the left first metatarsophalangeal joint. He presented to urgent care on 12/19/2014 started on Keflex and Bactrim left swelling got better however discharge persists. Went to podiatry doctor on 12/31/2014 Dr. Su Hoff) recommended transfer to hospital for IV antibiotic and podiatry consult. "     PMH- DM, HTN, diabetic ulcer of left foot    PSH-hand surgery    Assessment and Plan    1. Diabetic foot infection with high ESR which failed outpatient antibiotic treatment  We'll get MRI left foot to rule out osteomyelitis  We'll continue broad-spectrum IV antibiotic with vancomycin and zosyn  Podiatry consult in a.m.  2. AK likely secondary to prerenal azotemia  We will continue IV fluid with recheck kidney function in a.m.  3. Diabetes mellitus  We'll hold metformin for elevated creatinine  We'll continue long-acting and short-acting insulin sliding scale    98.7-106,105,102-96%-18-108/80    Zosyn iv q6h  vanco iv q12h  Ivf@100       Na 134  BS 217  Creat 1.52  BUN 42  Sed rate 93      Wound cx    Gram Stain (Final)   Few WBC's Seen   No Organisms Seen    Culture Result (Final)   No Growth Final    Blood cx      MRI left foot on 11/12  IMPRESSION:   Soft tissue ulceration of the medial aspect of the first metatarsophalangeal joint. Adjacent cellulitis and tiny 3 mm  fluid collection suggesting small abscess in the plantar soft tissues. Bone marrow signal changes in the distal first  metatarsal and metatarsal head and also first proximal phalanx, consistent with osteomyelitis.      Update 12/28/14    98.1 F (36.7 C)  Oral  89  99 %  --  15  124/69 mmHg     Zosyn iv   vanco iv      BS 168  BUN 24  Creat 1.06    MD note  Assessment:   Principal Problem:   Diabetic ulcer of left foot associated with type 2 diabetes mellitus  Active Problems:   Osteomyelitis left foot   Type 2 diabetes mellitus with diabetic neuropathy   Diabetes mellitus with foot ulcer   HTN (hypertension)   AKI (acute kidney injury)   Hyperlipemia    Admitted on transfer 11/11 from [facility] with a several month history of left diabetic foot ulcer. Wound culture as an outpatient several months ago grew MSSA. He's been on Bactrim. Had it debrided recently by Dr. Nada Boozer. ESR significant elevated. X-ray with soft tissue swelling.        wound culture negative MRI does suggest osteomyelitis underlying his ulcer. Dr. Autumn Patty help appreciated. Creatinine improved.    Plan: 1.   Continue empiric antibiotics with vancomycin and Zosyn. Follow wound cultures. Will likely need six-week course of IV antibiotics for osteomyelitis  2. Awaiting further evaluation from podiatry. Will need surgical intervention for foot abscess and osteomyelitis        Ivan Anchors RN UR  Phone [phone]  Fax [phone]  Ref # [id number]

## 2014-12-29 NOTE — Progress Notes (Addendum)
[place] [address]     INITIAL ASSESSMENT  Case Management       Estimated D/C Date:     t b d ( will need IV antibiotic therapy arrangements made for [place] )   RX Coverage:       Will assess his coverage on Rx's   Inpatient Plan of Care:      Pain mgt., medical mgt, DM mgt., DM educator referral, podiatry consult, DVT prophylactics, IV antibiotics, Inf Disesase consult , PICC line placement, [place] planning   CM Interventions:      Met with pt and his SO, Tracey, and reviewed the plan of care and [place] planning needs.  Pt informed me that the podiatrist was just in and informed him he would not need surgery but would need " 6 wks of IV antibiotics"  Reviewed options for either outpt infusions or home infusions. Pt would prefer home therapy " if my insurance covers it " agreed to referrals to [facility].    Referral made to the above providers via edc of the potential referral to them     Pt states " I have an old glucometer from [facility]" upon questioning his home DM mgt plan. He is interested in DM education while hospitalized stating he has been advised of this in the past by his PCP but the classes ( outpt ) would interfere with his working.          12/29/14 1417   Patient Type   Within 30 Days of Previous Admission? No   Medicare focused diagnosis patient? Not a Medicare focused diagnosis patient   Bundle patient? Not a bundle patient   Healthcare Decisions   Interviewed: Patient   Orientation/Decision Making Abilities of Patient Alert and Oriented x3, able to make decisions   Prior to admission   Prior level of function Independent with ADLs   Type of Residence Private residence   Have running water, electricity, heat, etc? Yes   Living Arrangements Spouse/significant other  (SO)   Discharge [place] other   Patient expects to be discharged to: home   Anticipated [place] plan discussed with: Same as  interviewed;Significant other   Mode of transportation: Private car (family member)   Consults/Providers   PT Evaluation Needed 2   OT Evalulation Needed 2   Correct PCP listed in Epic? Yes   [place]  Orthopaedic Nurse Case Manager  [phone]

## 2014-12-29 NOTE — Progress Notes (Addendum)
MEDICINE PROGRESS NOTE    Date Time: 12/29/2014 2:07 PM  Patient Name: [name]  Attending Physician: Timoteo Expose, MD    Assessment:   Principal Problem:    Diabetic ulcer of left foot associated with type 2 diabetes mellitus  Active Problems:    Osteomyelitis left foot    Type 2 diabetes mellitus with diabetic neuropathy    Diabetes mellitus with foot ulcer    HTN (hypertension)    AKI (acute kidney injury)    Hyperlipemia    Admitted on transfer 11/11 from [facility] with a several month history of left diabetic foot ulcer. Wound culture as an outpatient several months ago grew MSSA. He's been on Bactrim/keflex. Had it debrided recently by Dr. Nada Boozer. ESR significant  elevated. X-ray with soft tissue swelling; MRI showed "Soft tissue ulceration of the medial aspect of the first metatarsophalangeal joint. Adjacent cellulitis and tiny 3 mm fluid collection suggesting small abscess in the plantar soft tissues. Bone marrow signal changes in the distal first metatarsal and metatarsal head and also first proximal phalanx, consistent with osteomyelitis."  Podiatry does not think I&D is indicated.      Plan:   1. Change abx to ancef  2. ID consult  3. Initiate basal bolus regimen  4. Place PIC  5. Continue atorvastatin for lipids  6. Lovenox for DVT prophylaxis  7. Possibly d/c tomorrow if iv abx can be arranged    Subjective     CC: Diabetic ulcer of left foot associated with type 2 diabetes mellitus    HPI/Subjective: No complaints. No f/c      Physical Exam:   Temp:  [98 F (36.7 C)-98.1 F (36.7 C)] 98 F (36.7 C)  Heart Rate:  [83-90] 90  Resp Rate:  [15-16] 16  BP: (111-124)/(68-69) 112/69 mmHg    General: awake, alert, no acute distress.  EYES:  perrla, eomi, sclera anicteric   HENT:  Head atraumatic, oropharynx clear without lesions, mucous membranes moist  Neck: supple, no lymphadenopathy, no thyromegaly, no JVD, no carotid bruits  Cardiovascular: regular rate and rhythm,normal S1,S2 no murmurs, rubs or  gallops, normal palpation  Lungs: clear to auscultation bilaterally, without wheezing, rhonchi, or rales, nomal palpation, normal respiratory effort  Abdomen: soft, non-tender, non-distended; no palpable masses, no hepatosplenomegaly, normoactive bowel sounds,  Extremities: no clubbing, cyanosis, or edema, normal flexion and extension, left foot dressing not opened  Neuro: cranial nervesII-XII grossly intact, strength 5/5 in upper and lower extremities  Psychiatric:   normal affect and orientation    Meds:     Current Facility-Administered Medications   Medication Dose Route Frequency   . aspirin  81 mg Oral Daily   . atorvastatin  10 mg Oral Daily   . collagenase   Topical Daily   . enoxaparin  40 mg Subcutaneous Q24H   . famotidine  20 mg Oral Q12H SCH   . gabapentin  100 mg Oral QHS   . insulin aspart  6 Units Subcutaneous TID AC   . [START ON 12/30/2014] insulin detemir  20 Units Subcutaneous QAM   . lactobacillus species  50 Billion CFU Oral Q24H   . piperacillin-tazobactam  3.375 g Intravenous Q6H   . vancomycin  1,000 mg Intravenous Q12H   . vancomycin therapy placeholder   Does not apply See Admin Instructions     acetaminophen **OR** acetaminophen **OR** acetaminophen, cyclobenzaprine, dextrose, dextrose, insulin aspart, insulin aspart       Labs:     Labs (  last 72 hours):      Recent Labs  Lab 12/29/14  0440 12/28/14  0732   WBC 8.9 8.5   HEMOGLOBIN 13.5 13.8   HEMATOCRIT 39.2 40.9   PLT CT 284 289            Recent Labs  Lab 12/29/14  0440 12/28/14  0732 12/26/14  1714   SODIUM 137 137 134*   POTASSIUM 4.5 4.6 5.2   CHLORIDE 102 103 105   CO2 27.1 25.5 20.0   BUN 21 24* 42*   CREATININE 1.04 1.06 1.52*   CALCIUM 9.0 9.3 9.6   ALBUMIN  --   --  3.0*   PROTEIN, TOTAL  --   --  7.2   BILIRUBIN, TOTAL  --   --  0.2   ALKALINE PHOSPHATASE  --   --  121   ALT  --   --  23   AST (SGOT)  --   --  13   GLUCOSE 144* 168* 217*                     Radiology:   No results found.    Imaging reviewed.      Signed by:  Timoteo Expose, MD

## 2014-12-29 NOTE — Progress Notes (Signed)
Summary.  No changes from initial assessment.  Pt A&O, IV antibiotics infused as ordered.  Dressing to left foot CDI.  Full ROM to LE.  Up to BR, voids without difficulty.  No needs at this time.

## 2014-12-29 NOTE — Progress Notes (Signed)
PROGRESS NOTE    Date Time: 12/29/2014 12:06 PM  Patient Name: [name],[name]      Subjective:   F/u left foot. MRI shows osteomyelitis and  fluid collection.  He states that he is feeling better.  Santyl started for wound care.  Patient relates no other concerns.  Medications:     Current Facility-Administered Medications   Medication Dose Route Frequency   . aspirin  81 mg Oral Daily   . atorvastatin  10 mg Oral Daily   . collagenase   Topical Daily   . enoxaparin  40 mg Subcutaneous Q24H   . famotidine  20 mg Oral Q12H SCH   . gabapentin  100 mg Oral QHS   . insulin detemir  30 Units Subcutaneous QAM   . lactobacillus species  50 Billion CFU Oral Q24H   . piperacillin-tazobactam  3.375 g Intravenous Q6H   . vancomycin  1,000 mg Intravenous Q12H   . vancomycin therapy placeholder   Does not apply See Admin Instructions         Physical Exam:     Filed Vitals:    12/29/14 0700   BP: 112/69   Pulse: 90   Temp: 98 F (36.7 C)   Resp: 16   SpO2: 100%       Intake and Output Summary (Last 24 hours) at Date Time    Intake/Output Summary (Last 24 hours) at 12/29/14 1206  Last data filed at 12/28/14 2350   Gross per 24 hour   Intake    810 ml   Output      0 ml   Net    810 ml       General appearance - well appearing, and in no distress  Mental status - alert, oriented to person, place, and time, normal mood, behavior, speech, dress, motor activity, and thought processes  Neurological - alert, oriented, normal speech, no focal findings or movement disorder noted, DTR's normal and symmetric    Ulcer right foot is covered with fibrous tissue.  No erythema or edema.  No fluctuance.  Clear serous drainage.      Labs:     Results     Procedure Component Value Units Date/Time    Dextrose Stick Glucose [id number]  (Abnormal) Collected:  12/29/14 0757    Specimen Information:  Blood Updated:  12/29/14 0813     Glucose, POCT 153 (H) mg/dL     Basic Metabolic Panel [id number]  (Abnormal) Collected:  12/29/14 0440    Specimen  Information:  Plasma Updated:  12/29/14 0547     Sodium 137 mMol/L      Potassium 4.5 mMol/L      Chloride 102 mMol/L      CO2 27.1 mMol/L      Calcium 9.0 mg/dL      Glucose 144 (H) mg/dL      Creatinine 1.04 mg/dL      BUN 21 mg/dL      Anion Gap 12.4 mMol/L      BUN/Creatinine Ratio 20.2 Ratio      EGFR >60 mL/min/1.66m2      Osmolality Calc 279 mOsm/kg     CBC and differential [id number] Collected:  12/29/14 0440    Specimen Information:  Blood from Blood Updated:  12/29/14 0527     WBC 8.9 K/cmm      RBC 4.36 M/cmm      Hemoglobin 13.5 gm/dL      Hematocrit 39.2 %  MCV 90 fL      MCH 31 pg      MCHC 35 gm/dL      RDW 12.4 %      PLT CT 284 K/cmm      MPV 8.0 fL      NEUTROPHIL % 60.5 %      Lymphocytes 28.0 %      Monocytes 7.8 %      Eosinophils % 2.8 %      Basophils % 1.0 %      Neutrophils Absolute 5.4 K/cmm      Lymphocytes Absolute 2.5 K/cmm      Monocytes Absolute 0.7 K/cmm      Eosinophils Absolute 0.2 K/cmm      BASO Absolute 0.1 K/cmm     Dextrose Stick Glucose [id number]  (Abnormal) Collected:  12/28/14 2104    Specimen Information:  Blood Updated:  12/28/14 2121     Glucose, POCT 170 (H) mg/dL     Dextrose Stick Glucose [id number]  (Abnormal) Collected:  12/28/14 1656    Specimen Information:  Blood Updated:  12/28/14 1712     Glucose, POCT 248 (H) mg/dL     Dextrose Stick Glucose [id number]  (Abnormal) Collected:  12/28/14 1235    Specimen Information:  Blood Updated:  12/28/14 1251     Glucose, POCT 210 (H) mg/dL           Recent BMP   Recent Labs      12/29/14   0440   GLUCOSE  144*   BUN  21   CREATININE  1.04   CALCIUM  9.0   SODIUM  137   POTASSIUM  4.5   CHLORIDE  102   CO2  27.1     Recent CBC WITH DIFF   Recent Labs      12/29/14   0440   RBC  4.36   HEMOGLOBIN  13.5   HEMATOCRIT  39.2   MCV  90   MCHC  35   RDW  12.4   MPV  8.0       Lab Results   Component Value Date    WBC 8.9 12/29/2014    HGB 13.5 12/29/2014    PLT 284 12/29/2014    NA 137 12/29/2014    K 4.5 12/29/2014    BUN  21 12/29/2014    CREAT 1.04 12/29/2014    AST 13 12/26/2014    ALB 3.0* 12/26/2014    LDL 91 10/28/2014       Rads:   Radiological Procedure reviewed.    Assessment:   Acute osteomyelitis left foot.  Cellulitis left foot improved.  Chronic diabetic ulcer left foot.  DM-2 with neuropathy.    Plan:   Patient was seen, examined and informed of findings and treatment plan.  Reviewed MRI results with the patient.  Clinically, I do not feel he needs I&D at this time as his foot looks stable.  Needs 6 weeks IV abx and local wound care with Santyl.  Needs PICC line.  OK to go from my standpoint, WBAT with surgical shoe.  I will follow him up in my office in a week.          Signed by: Janora Norlander, DPM

## 2014-12-30 ENCOUNTER — Encounter
Admission: AD | Disposition: A | Discharge status: Home Health | Payer: Self-pay | Source: Other Acute Inpatient Hospital | Attending: Internal Medicine

## 2014-12-30 LAB — VH DEXTROSE STICK GLUCOSE
Glucose POCT: 209 mg/dL — ABNORMAL HIGH (ref 70–99)
Glucose POCT: 201 mg/dL — ABNORMAL HIGH (ref 70–99)
Glucose POCT: 173 mg/dL — ABNORMAL HIGH (ref 70–99)
Glucose POCT: 191 mg/dL — ABNORMAL HIGH (ref 70–99)

## 2014-12-30 SURGERY — PICC LINE PLACEMENT SINGLE LUMEN
Site: Arm Upper

## 2014-12-30 MED ORDER — METFORMIN HCL 500 MG PO TABS
1000.0000 mg | ORAL_TABLET | Freq: Two times a day (BID) | ORAL | Status: DC
Start: 2014-12-30 — End: 2014-12-30
  Filled 2014-12-30: qty 2

## 2014-12-30 MED ORDER — SODIUM CHLORIDE 0.9 % IJ SOLN
10.0000 mL | Freq: Every day | INTRAMUSCULAR | Status: DC
Start: 2014-12-30 — End: 2014-12-31
  Administered 2014-12-31: 10 mL

## 2014-12-30 MED ORDER — HEPARIN LOCK FLUSH 100 UNIT/ML IV SOLN
INTRAVENOUS | Status: AC
Start: 2014-12-30 — End: 2014-12-30
  Filled 2014-12-30: qty 5

## 2014-12-30 MED ORDER — INSULIN ASPART 100 UNIT/ML SC SOPN
7.0000 [IU] | PEN_INJECTOR | Freq: Three times a day (TID) | SUBCUTANEOUS | Status: DC
Start: 2014-12-30 — End: 2014-12-31
  Administered 2014-12-30 – 2014-12-31 (×4): 7 [IU] via SUBCUTANEOUS

## 2014-12-30 MED ORDER — SODIUM CHLORIDE 0.9 % IJ SOLN
10.0000 mL | Freq: Every day | INTRAMUSCULAR | Status: DC
Start: 2014-12-30 — End: 2014-12-31
  Administered 2014-12-30 – 2014-12-31 (×2): 10 mL

## 2014-12-30 MED ORDER — HEPARIN SOD (PORK) LOCK FLUSH 10 UNIT/ML IV SOLN
3.0000 mL | Freq: Every day | INTRAVENOUS | Status: DC
Start: 2014-12-30 — End: 2014-12-31
  Administered 2014-12-31: 5 mL
  Filled 2014-12-30 (×2): qty 5

## 2014-12-30 MED ORDER — SODIUM CHLORIDE 0.9 % IJ SOLN
10.0000 mL | INTRAMUSCULAR | Status: DC | PRN
Start: 2014-12-30 — End: 2014-12-31

## 2014-12-30 MED ORDER — METFORMIN HCL 500 MG PO TABS
1000.0000 mg | ORAL_TABLET | Freq: Two times a day (BID) | ORAL | Status: DC
Start: 2014-12-30 — End: 2014-12-31
  Administered 2014-12-30 – 2014-12-31 (×2): 1000 mg via ORAL
  Filled 2014-12-30 (×3): qty 2

## 2014-12-30 NOTE — Progress Notes (Signed)
MEDICINE PROGRESS NOTE    Date Time: 12/30/2014 12:14 PM  Patient Name: [name]  Attending Physician: Timoteo Expose, MD    Assessment:   Principal Problem:    Diabetic ulcer of left foot associated with type 2 diabetes mellitus  Active Problems:    Osteomyelitis left foot    Type 2 diabetes mellitus with diabetic neuropathy    Diabetes mellitus with foot ulcer    HTN (hypertension)    AKI (acute kidney injury)    Hyperlipemia    Admitted on transfer 11/11 from [facility] with a several month history of left diabetic foot ulcer. Wound culture as an outpatient several months ago grew MSSA. He's been on Bactrim/keflex. Had it debrided recently by Dr. Nada Boozer. ESR significant  elevated. X-ray with soft tissue swelling; MRI showed "Soft tissue ulceration of the medial aspect of the first metatarsophalangeal joint. Adjacent cellulitis and tiny 3 mm fluid collection suggesting small abscess in the plantar soft tissues. Bone marrow signal changes in the distal first metatarsal and metatarsal head and also first proximal phalanx, consistent with osteomyelitis."  Podiatry does not think I&D is indicated.      Plan:   1. Cont ancef  2. Awaiting ID consult  3. Cont basal bolus regimen; increase novolog; restart metformin  4. Place PICC  5. Continue atorvastatin for lipids  6. Lovenox for DVT prophylaxis  7. Possibly d/c tomorrow if iv abx can be arranged    Subjective     CC: Diabetic ulcer of left foot associated with type 2 diabetes mellitus    HPI/Subjective: No complaints. No f/c      Physical Exam:   Temp:  [98 F (36.7 C)-98.8 F (37.1 C)] 98.1 F (36.7 C)  Heart Rate:  [87-91] 89  Resp Rate:  [16-18] 18  BP: (118-133)/(72-78) 118/74 mmHg    General: awake, alert, no acute distress.  EYES:  perrla, eomi, sclera anicteric   HENT:  Head atraumatic, oropharynx clear without lesions, mucous membranes moist  Neck: supple, no lymphadenopathy, no thyromegaly, no JVD, no carotid bruits  Cardiovascular: regular rate and  rhythm,normal S1,S2 no murmurs, rubs or gallops, normal palpation  Lungs: clear to auscultation bilaterally, without wheezing, rhonchi, or rales, nomal palpation, normal respiratory effort  Abdomen: soft, non-tender, non-distended; no palpable masses, no hepatosplenomegaly, normoactive bowel sounds,  Extremities: no clubbing, cyanosis, or edema, normal flexion and extension, left foot dressing not opened  Neuro: cranial nervesII-XII grossly intact, strength 5/5 in upper and lower extremities  Psychiatric:   normal affect and orientation    Meds:     Current Facility-Administered Medications   Medication Dose Route Frequency   . aspirin  81 mg Oral Daily   . atorvastatin  10 mg Oral Daily   . ceFAZolin  1 g Intravenous Q8H   . collagenase   Topical Daily   . enoxaparin  40 mg Subcutaneous Q24H   . famotidine  20 mg Oral Q12H SCH   . gabapentin  100 mg Oral QHS   . insulin aspart  7 Units Subcutaneous TID AC   . insulin detemir  20 Units Subcutaneous QAM   . lactobacillus species  50 Billion CFU Oral Q24H   . metFORMIN  1,000 mg Oral BID Meals     acetaminophen **OR** acetaminophen **OR** acetaminophen, cyclobenzaprine, dextrose, dextrose, insulin aspart, insulin aspart       Labs:     Labs (last 72 hours):      Recent Labs  Lab 12/29/14  0440 12/28/14  0732   WBC 8.9 8.5   HEMOGLOBIN 13.5 13.8   HEMATOCRIT 39.2 40.9   PLT CT 284 289            Recent Labs  Lab 12/29/14  0440 12/28/14  0732 12/26/14  1714   SODIUM 137 137 134*   POTASSIUM 4.5 4.6 5.2   CHLORIDE 102 103 105   CO2 27.1 25.5 20.0   BUN 21 24* 42*   CREATININE 1.04 1.06 1.52*   CALCIUM 9.0 9.3 9.6   ALBUMIN  --   --  3.0*   PROTEIN, TOTAL  --   --  7.2   BILIRUBIN, TOTAL  --   --  0.2   ALKALINE PHOSPHATASE  --   --  121   ALT  --   --  23   AST (SGOT)  --   --  13   GLUCOSE 144* 168* 217*                     Radiology:   No results found.    Imaging reviewed.      Signed by: Timoteo Expose, MD

## 2014-12-30 NOTE — Procedures (Signed)
Image guidance used to place PICC.    Size: 36F  Arm:  left  Vein:  basilic  Lumen #:  1  Length:  46 cm    No complications.  PICC flushes and aspirates well.    Please see formal Radiology dictation for details of procedure.      Latricia Heft RPA/RA  Pager [phone]

## 2014-12-30 NOTE — Progress Notes (Signed)
Pt to Radiology for PICC line via stretcher.

## 2014-12-30 NOTE — Consults (Signed)
Productive discussion with this patient this morning.    He mentions that he was taking Metformin at home but has not taken it while hospitalized, but plans to talk to physician today when the physician rounds. Patient tells me that he was taking 30 units of Levemir at home, but only Levemir 20 units here, encouraged him to discuss this with the physician during today's visit (in fact his questions are written down at his bedside so that he would remember to ask). He may benefit from having bolus or meal time insulin now and upon discharge.   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 Encouraged him to discuss a referral to the [facility] Diabetes Management program at [facility] (close to home) whereby they could work with him one on one at times that meet his specific educational needs-----patient agreeable and acknowledges his need for diabetes education. He states he will be off work for at least 6 weeks and he could get the education during that time.   Discussed the "Plate" method, and provided educational materials, along with contact information. Patient aware that his foot tends to be a vunerable area for him and has had other struggles with the same foot, and realizing that if this foot ulcer heals he has dodged a bullet. Discussed upper body exercises that he can do in order to help clear glucose from the blood-----realizing he now has physical limitations due to the foot ulcer. Appears serious and focused to control his diabetes.     Education material provided.   Contact information provided.

## 2014-12-30 NOTE — Progress Notes (Signed)
Foot and Ankle Surgery  PROGRESS NOTE       Date Time: 12/30/2014 4:54 PM  Patient Name: [name] Day: 5      Assessment:   [name] is a 49 y.o. male with acute OM LEFT foot, stable.    Acute osteomyelitis left foot.  Cellulitis left foot improved.  Chronic diabetic ulcer left foot.  DM-2 with neuropathy.    Plan:   PICC line plasced   6 weeks IV abx and local wound care with Santyl.  Stable for D/C per podiatry  WBAT with surgical shoe.  F/U 1 week in office    Subjective:   [name] was seen at bedside and reports no pain. Denies nausea, vomiting, fever, chills, significant weight change, shortness of breath, calf pain, or other abnormalities.     Stated he needed note for work. He works at a [employer] and will try to move to office or light duty to accommodate PICC and offloading regimen.     All questions and concerns were addressed.     Current Vitals:   Filed Vitals:    12/30/14 1515   BP: 139/81   Pulse: 98   Temp: 98.3 F (36.8 C)   Resp: 18   SpO2: 100%     Vital signs in last 24 hours:Temp:  [98 F (36.7 C)-98.3 F (36.8 C)] 98.3 F (36.8 C)  Heart Rate:  [87-98] 98  Resp Rate:  [17-18] 18  BP: (118-139)/(72-81) 139/81 mmHg      Physical Exam:   General: Patient is alert and orieted x3    Derm:   Ulcer right foot is covered with fibrous tissue. No erythema or edema. No fluctuance. Clear serous drainage.    Neuro: Gross sensation diminished    Vasc:   Cap refill to digits is less than 3 seconds.    MSK:  Pt able to wiggle toes        Microbiology:     Microbiology Results     None            Radiology:   Xr Foot Left Ap And Lateral    12/25/2014  Moderate degenerative changes of the first MT are present. Adjacent soft tissue swelling is present. Adjacent skin defect also noted. There is deformity of the articular surface of the distal aspect of the first proximal phalanx. Fracture not excluded. Further evaluation with oblique view suggested. Trans: aa  ReadingStation:[id number]    Mri Foot Left W Wo Contrast    12/28/2014  Soft tissue ulceration of the medial aspect of the first metatarsophalangeal joint. Adjacent cellulitis and tiny 3 mm fluid collection suggesting small abscess in the plantar soft tissues. Bone marrow signal changes in the distal first metatarsal and metatarsal head and also first proximal phalanx, consistent with osteomyelitis. ReadingStation:[email or web address]    Picc Line Placement Single Lumen    12/30/2014  Successful ultrasound guided access and fluoroscopically guided left peripherally inserted central venous catheter placement. PICC line ready for use. ReadingStation:[email or web address]      Labs:   Hematology (most recent):  Lab Results   Component Value Date/Time    WBC 8.9 12/29/2014 04:40 AM    HEMATOCRIT 39.2 12/29/2014 04:40 AM    HEMOGLOBIN 13.5 12/29/2014 04:40 AM   Chemistry (most recent):  Lab Results   Component Value Date/Time    SODIUM 137 12/29/2014 04:40 AM    POTASSIUM 4.5 12/29/2014 04:40 AM    CHLORIDE 102 12/29/2014 04:40  AM    CO2 27.1 12/29/2014 04:40 AM    BUN 21 12/29/2014 04:40 AM    CREATININE 1.04 12/29/2014 04:40 AM    GLUCOSE 144* 12/29/2014 04:40 AM    CALCIUM 9.0 12/29/2014 04:40 AM   Coagulation Profile (most recent):No results found for: PT, PTT, INRHepatic Profile (most recent):  Lab Results   Component Value Date/Time    AST (SGOT) 13 12/26/2014 05:14 PM    ALT 23 12/26/2014 05:14 PM   Nutrition Profile (last two values):  Lab Results   Component Value Date/Time    ALBUMIN 3.0* 12/26/2014 05:14 PM    ALBUMIN 3.5 10/28/2014 10:10 AM                 Problem List:     Patient Active Problem List   Diagnosis   . Type 2 diabetes mellitus with diabetic neuropathy   . Carpal tunnel syndrome of right wrist   . Nicotine abuse   . Diabetic ulcer of left foot associated with type 2 diabetes mellitus   . Acute renal failure   . Diabetes mellitus with foot ulcer   . HTN (hypertension)   . AKI (acute kidney injury)   . Hyperlipemia                 Kenard Gower, DPM  Foot and Ankle Surgery PGY-1  [phone]

## 2014-12-30 NOTE — Plan of Care (Signed)
Problem: Pain  Goal: Patient's pain/discomfort is manageable  Outcome: Progressing  Pain rated at 0

## 2014-12-30 NOTE — Progress Notes (Signed)
Pt alert and oriented x 4, mood appropriate, no distress noted. Lungs clear to ascultation. Cardiovasc regular rate and rhythm. VSS. Neurovasc intact. L foot [place] dressing in place and intact. Denies pain. Denies N/V. LUE PICC in place, flushed, blood return noted. IV dressing still remains in place and intact. Pt ambulates independently to restroom. Adequate intake and output. Active BS in all 4Qs. Side rails  X 3, call light within reach, family at bedside.

## 2014-12-30 NOTE — Plan of Care (Signed)
Problem: Pain  Goal: Patient's pain/discomfort is manageable  Outcome: Progressing  Pt denies any pain.    Comments:   Left foot dressing dry and intact. Pt voiding without difficulty. Pt tolerating ancef antibiotic without difficulty.

## 2014-12-31 LAB — VH DEXTROSE STICK GLUCOSE
Glucose POCT: 151 mg/dL — ABNORMAL HIGH (ref 70–99)
Glucose POCT: 147 mg/dL — ABNORMAL HIGH (ref 70–99)

## 2014-12-31 MED ORDER — INSULIN DETEMIR 100 UNIT/ML SC SOPN
20.0000 [IU] | PEN_INJECTOR | Freq: Every morning | SUBCUTANEOUS | Status: DC
Start: 2014-12-31 — End: 2015-02-20

## 2014-12-31 MED ORDER — INSULIN ASPART 100 UNIT/ML SC SOPN
7.0000 [IU] | PEN_INJECTOR | Freq: Three times a day (TID) | SUBCUTANEOUS | Status: DC
Start: 2014-12-31 — End: 2015-02-10

## 2014-12-31 MED ORDER — CEFAZOLIN SODIUM 1 G IJ SOLR
2.0000 g | Freq: Three times a day (TID) | INTRAMUSCULAR | Status: DC
Start: 2014-12-31 — End: 2015-02-10

## 2014-12-31 MED ORDER — CEFAZOLIN SODIUM 1 G IJ SOLR
1.0000 g | Freq: Three times a day (TID) | INTRAMUSCULAR | Status: DC
Start: 2014-12-31 — End: 2014-12-31

## 2014-12-31 NOTE — Plan of Care (Signed)
Problem: Pain  Goal: Patient's pain/discomfort is manageable  Outcome: Progressing  Pt rates pain 0 out of 10 on pain scale.    Comments:   Pt voiding without difficulty. Left foot dressing dry and intact.

## 2014-12-31 NOTE — Progress Notes (Signed)
Dressing changed to picc line. Tolerated well.

## 2014-12-31 NOTE — Discharge Summary -  Nursing (Signed)
Goals adequate for discharge. Discharge instructions given.

## 2014-12-31 NOTE — Discharge Summary (Signed)
[facility]      Patient: [name]  Admission Date: 12/26/2014   DOB: 1965-05-28  Discharge Date: 12/31/2014    MRN: [id number]  Discharge Attending: Barkley Boards, MD   Referring Physician: Rhunette Croft, MD  PCP: Rhunette Croft, MD       DISCHARGE SUMMARY     Discharge Information   Discharge Diagnoses:    Osteomyelitis involving the first proximal phalanx and the first distal metatarsal head   Uncontrolled DM  Hypertension  Dyslipidemia      Discharge Medications:     Medication List      START taking these medications          ceFAZolin 1 G injection   Commonly known as:  ANCEF   Inject 2 g into the vein every 8 (eight) hours.       insulin aspart 100 UNIT/ML injection pen   Commonly known as:  NovoLOG   Inject 7 Units into the skin 3 (three) times daily before meals.       insulin detemir 100 UNIT/ML injection pen   Commonly known as:  LEVEMIR FLEXPEN   Inject 20 Units into the skin every morning.   Replaces:  insulin detemir 100 UNIT/ML injection         CONTINUE taking these medications          aspirin 81 MG chewable tablet   Commonly known as:  ASPIRIN CHILDRENS   Chew 1 tablet (81 mg total) by mouth daily.       atorvastatin 10 MG tablet   Commonly known as:  LIPITOR   Take 1 tablet (10 mg total) by mouth daily.       collagenase ointment   Commonly known as:  SANTYL   Apply topically daily.       cyclobenzaprine 10 MG tablet   Commonly known as:  FLEXERIL   Take 1 tablet (10 mg total) by mouth every 8 (eight) hours as needed for Muscle spasms (pain or muscle spasm).       gabapentin 100 MG capsule   Commonly known as:  NEURONTIN   Take 1 capsule (100 mg total) by mouth nightly.       lisinopril 10 MG tablet   Commonly known as:  PRINIVIL,ZESTRIL   Take 1 tablet (10 mg total) by mouth daily.       meloxicam 7.5 MG tablet   Commonly known as:  MOBIC   Take 1 tablet (7.5 mg total) by mouth daily.       metFORMIN 1000 MG tablet   Commonly known as:  GLUCOPHAGE   Take 1 tablet (1,000  mg total) by mouth 2 (two) times daily with meals.       ondansetron 4 MG disintegrating tablet   Commonly known as:  ZOFRAN ODT   Take 1 tablet (4 mg total) by mouth every 8 (eight) hours as needed for Nausea.       silver sulfADIAZINE 1 % cream   Commonly known as:  SILVADENE, SSD   Apply to affected area daily         STOP taking these medications          cephALEXin 500 MG capsule   Commonly known as:  KEFLEX       Dapagliflozin Propanediol 5 MG Tabs   Commonly known as:  FARXIGA       insulin detemir 100 UNIT/ML injection   Commonly known as:  LEVEMIR   Replaced  by:  insulin detemir 100 UNIT/ML injection pen       sulfamethoxazole-trimethoprim 800-160 MG per tablet   Commonly known as:  BACTRIM DS            Where to Get Your Medications      These medications were sent to [facility] [address] [place], [place], [place] [address]     Phone:  [phone]    - insulin aspart 100 UNIT/ML injection pen      Information about where to get these medications is not yet available     ! Ask your nurse or doctor about these medications    - ceFAZolin 1 G injection  - insulin detemir 100 UNIT/ML injection pen              Hospital Course   History of Present Illness and Hospital Course (5 Days)     [name] is a 49 y.o. male with   History of uncontrolled diabetes, diabetic neuropathy, who  initially developed a blister near his left first  metatarsophalangeal joint about two months ago.  He had been  seen by his primary care physician and Podiatry as an  outpatient.  He was recently started on Bactrim and Keflex after  he was evaluated at an urgent care center.  Eventually, he was  seen by Dr. Nada Boozer, a local podiatrist, who recommended for him to  be admitted to the hospital for IV antibiotic treatment.  After  admission, wound culture eventually grew MSSA.  His initial  antibiotics which included Zosyn and vancomycin were switched to  Ancef.  MRI of the left  foot obtained on admission demonstrated  not only ulceration of the medial aspect of the first  metatarsophalangeal joint associated with surrounding  cellulitis.  A small abscess in the plantar soft tissue as well  as osteomyelitis involving the distal first metatarsal and the  metatarsal head as well as first proximal phalanx was also seen.  Podiatry did not think surgical intervention was indicated at  this point.  Infectious Disease was subsequently consulted and  Dr. Luberta Robertson recommended Ancef 2 g IV every 8 hours for 28  days, beginning November 12th.  After outpatient IV antibiotics  has been arranged by case management and the PICC line was  placed, he was subsequently discharged home.    Also during this hospitalization, adjustment of his insulin  regimen was needed in order to control his uncontrolled  diabetes.  On presentation, his A1c was over 9.  With the basal  bolus regimen as listed above, his fingersticks have slowly  trended to below 150.                       Procedures/Imaging   PICC Line Placement Single Lumen   Final Result   Successful ultrasound guided access and fluoroscopically guided left peripherally inserted central venous catheter   placement. PICC line ready for use.      ReadingStation:[email or web address]      MRI FOOT LEFT W WO CONTRAST   Final Result   Soft tissue ulceration of the medial aspect of the first metatarsophalangeal joint. Adjacent cellulitis and tiny 3 mm   fluid collection suggesting small abscess in the plantar soft tissues. Bone marrow signal changes in the distal first   metatarsal and metatarsal head and also first proximal phalanx, consistent with osteomyelitis.      ReadingStation:[email or web address]  Treatment Team:   Attending Provider: Timoteo Expose, MD  Consulting Physician: Janora Norlander, DPM  Consulting Physician: Hardin Negus, MD         Progress Note/Physical Exam at Discharge     Subjective: no new complaints    Filed Vitals:    12/30/14 0738 12/30/14 1515  12/31/14 0014 12/31/14 0731   BP: 118/74 139/81 138/76 142/65   Pulse: 89 98 89 89   Temp: 98.1 F (36.7 C) 98.3 F (36.8 C) 98.2 F (36.8 C) 98 F (36.7 C)   TempSrc: Oral Oral Oral Oral   Resp: 18 18 16 18    Height:       Weight:       SpO2: 98% 100% 99% 100%       General Appearance: No apparent distress.   Eyes: no scleral icterus or conjunctival pallor  Mouth: MMM, no thrush  Cardiovascular: RRR, no murmurs  Respiratory: No increased work of breathing. CTA.  Gastrointestinal: Soft, +BS, non-tender  Skin: left foot with dry dressing  Neurologic: no gross motor or sensory deficits  Psychiatric: alert and oriented, normal affect       Diagnostics     Labs/Studies Pending at Discharge: No    Last Labs     Recent Labs  Lab 12/29/14  0440 12/28/14  0732 12/26/14  1714   WBC 8.9 8.5 8.8   RBC 4.36 4.53 4.61   HEMOGLOBIN 13.5 13.8 13.7   HEMATOCRIT 39.2 40.9 40.7   MCV 90 90 88   PLT CT 284 289 303         Recent Labs  Lab 12/29/14  0440 12/28/14  0732 12/26/14  1714   SODIUM 137 137 134*   POTASSIUM 4.5 4.6 5.2   CHLORIDE 102 103 105   CO2 27.1 25.5 20.0   BUN 21 24* 42*   CREATININE 1.04 1.06 1.52*   GLUCOSE 144* 168* 217*   CALCIUM 9.0 9.3 9.6        Patient Instructions   Discharge Diet: cardiac diet and diabetic diet  Discharge Activity:  activity as tolerated    Follow Up Appointment:        Follow-up Information     Follow up with Rhunette Croft, MD In 1 week.    Specialty:  Family Medicine    Contact information:    [place] [address]  [phone]             Time spent examining patient, discussing with patient/family regarding hospital course, chart review, reconciling medications and discharge planning: 40 minutes.    Signed,  Barkley Boards, MD - I can be reached at [phone] or at Pager [phone])  12/31/2014 1:08 PM

## 2014-12-31 NOTE — Plan of Care (Signed)
Alert & oriented x 4. Lungs clear, heart rate regular. Neurovascular intact with some baseline neuropathy in upper extremities. Bowel sounds active, voiding in bathroom. Up ad lib. Denies pain. Dressing to left foot intact. Will continue to monitor. Call bell within reach.

## 2014-12-31 NOTE — Progress Notes (Signed)
Foot and Ankle Surgery  PROGRESS NOTE       Date Time: 12/31/2014 6:19 AM  Patient Name: [name] Day: 6      Assessment:   [name] is a 49 y.o. male with acute OM LEFT foot, stable.    Acute osteomyelitis left foot.  Cellulitis left foot improved.  Chronic diabetic ulcer left foot.  DM-2 with neuropathy.    MRI left foot - 'Bone marrow signal changes in the distal first metatarsal and metatarsal head and also first proximal phalanx, consistent with osteomyelitis'    Plan:   PICC line plasced   6 weeks IV abx and local wound care with Santyl.   Dressing changed today.  Stable for D/C per podiatry  WBAT with surgical shoe.  F/U 1 week in office    Subjective:   [name] was seen at bedside and reports no pain. Denies nausea, vomiting, fever, chills, significant weight change, shortness of breath, calf pain, or other abnormalities.     States he had hight blood sugar yesterday. Patient is wanting to go home.     All questions and concerns were addressed.     Current Vitals:   Filed Vitals:    12/31/14 0014   BP: 138/76   Pulse: 89   Temp: 98.2 F (36.8 C)   Resp: 16   SpO2: 99%     Vital signs in last 24 hours:Temp:  [98.1 F (36.7 C)-98.3 F (36.8 C)] 98.2 F (36.8 C)  Heart Rate:  [89-98] 89  Resp Rate:  [16-18] 16  BP: (118-139)/(74-81) 138/76 mmHg      Physical Exam:   General: Patient is alert and orieted x3    Derm:   Ulcer right foot is covered with fibrous tissue. No erythema or edema. No fluctuance. Clear serous drainage.   Appprx 2 cm in diameter to medial MPJ, full thickness.     Neuro: Gross sensation diminished    Vasc:   Cap refill to digits is less than 3 seconds.    MSK:  Pt able to wiggle toes        Microbiology:     Microbiology Results     None            Radiology:   Xr Foot Left Ap And Lateral    12/25/2014  Moderate degenerative changes of the first MT are present. Adjacent soft tissue swelling is present. Adjacent skin defect also noted. There is  deformity of the articular surface of the distal aspect of the first proximal phalanx. Fracture not excluded. Further evaluation with oblique view suggested. Trans: aa ReadingStation:[id number]    Mri Foot Left W Wo Contrast    12/28/2014  Soft tissue ulceration of the medial aspect of the first metatarsophalangeal joint. Adjacent cellulitis and tiny 3 mm fluid collection suggesting small abscess in the plantar soft tissues. Bone marrow signal changes in the distal first metatarsal and metatarsal head and also first proximal phalanx, consistent with osteomyelitis. ReadingStation:[id number]    Picc Line Placement Single Lumen    12/30/2014  Successful ultrasound guided access and fluoroscopically guided left peripherally inserted central venous catheter placement. PICC line ready for use. ReadingStation:[id number]      Labs:   Hematology (most recent):  Lab Results   Component Value Date/Time    WBC 8.9 12/29/2014 04:40 AM    HEMATOCRIT 39.2 12/29/2014 04:40 AM    HEMOGLOBIN 13.5 12/29/2014 04:40 AM   Chemistry (most recent):  Lab Results   Component Value Date/Time    SODIUM 137 12/29/2014 04:40 AM    POTASSIUM 4.5 12/29/2014 04:40 AM    CHLORIDE 102 12/29/2014 04:40 AM    CO2 27.1 12/29/2014 04:40 AM    BUN 21 12/29/2014 04:40 AM    CREATININE 1.04 12/29/2014 04:40 AM    GLUCOSE 144* 12/29/2014 04:40 AM    CALCIUM 9.0 12/29/2014 04:40 AM   Coagulation Profile (most recent):No results found for: PT, PTT, INRHepatic Profile (most recent):  Lab Results   Component Value Date/Time    AST (SGOT) 13 12/26/2014 05:14 PM    ALT 23 12/26/2014 05:14 PM   Nutrition Profile (last two values):  Lab Results   Component Value Date/Time    ALBUMIN 3.0* 12/26/2014 05:14 PM    ALBUMIN 3.5 10/28/2014 10:10 AM                 Problem List:     Patient Active Problem List   Diagnosis   . Type 2 diabetes mellitus with diabetic neuropathy   . Carpal tunnel syndrome of right wrist   . Nicotine abuse   . Diabetic ulcer of left foot associated with  type 2 diabetes mellitus   . Acute renal failure   . Diabetes mellitus with foot ulcer   . HTN (hypertension)   . AKI (acute kidney injury)   . Hyperlipemia                Kenard Gower, DPM  Foot and Ankle Surgery PGY-1  [phone]

## 2014-12-31 NOTE — Discharge Instructions (Addendum)
A visiting nurse will assist with your care at home from [facility].  They should contact you before they visit. Phone # [phone]    They are scheduled to see you on Friday am at aprox 8:30 am     Your IV medications will be delivered to your home from [facility] Phone # : [phone] and will perform the initial IV antibiotic administration education    DO NOT USE THE PICC LINE for anything other than its intended use.    Keep the PICC line dressing clean, dry and intact. The home nurse will change the PICC dressing per the home protocol.  PICC line dressing change completed today (12/31/14).     IV antibiotics, Ancef, are to be injected every 8 hours for 28 days starting on 12/27/14

## 2014-12-31 NOTE — Consults (Signed)
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 He is wearing steel toed boots and ulcerated his left first MTPJ area he developed erythema and drainage was seen by his family physician who sent him to podiatry who referred him for admission and IV antibiotic. Initial white count 8.8 he's remained afebrile. He received Unasyn and then he was given Zosyn and vancomycin the first day he had a total 4 days of Zosyn 3 days of vancomycin and then was placed on cefazolin on 1114 his cultures are growing MSSA from a wound culture obtained on 8/26 of this year. Most recent cultures showed no growth in his blood cultures were negative. His MRI was consistent with osteomyelitis involving the first proximal phalanx and the first distal metatarsal head as well as soft tissue swelling. He is currently receiving cefazolin 1 g every 8 hours with an estimated GFR over 60    Review of Systems:   Patient's had no fever chills  or sweats. He has diabetes with neuropathy and decreased sensation in his feet. 70 diarrhea and nausea vomiting. No shortness of breath or chest pain. The remainder of his review of systems is negative    Past Medical History:     Past Medical History   Diagnosis Date   . Seasonal allergic rhinitis    . Diabetes mellitus    . Disc    . Hypertension    . Diabetic ulcer of left foot 12/26/14       Past Surgical History:     Past  Surgical History   Procedure Laterality Date   . Hand surgery           Allergies:     Allergies   Allergen Reactions   . Flu Virus Vaccine Anaphylaxis       Social History:     Social History     Social History   . Marital Status: Single     Spouse Name: N/A   . Number of Children: N/A   . Years of Education: N/A     Occupational History   . Not on file.     Social History Main Topics   . Smoking status: Never Smoker    . Smokeless tobacco: Never Used   . Alcohol Use: No   . Drug Use: No   . Sexual Activity: Not on file     Other Topics Concern   . Not on file     Social History Narrative           Family History:     Family History   Problem Relation Age of Onset   . Diabetes Mother    . Depression Sister    . COPD Sister    . Hypertension Brother        Physical Exam:  Temp:  [98 F (36.7 C)-98.3 F (36.8 C)] 98 F (36.7 C)  Heart Rate:  [89-98] 89  Resp Rate:  [16-18] 18  BP: (138-142)/(65-81) 142/65 mmHg    Wt Readings from Last 3 Encounters:   12/26/14 92.715 kg (204 lb 6.4 oz)   12/26/14 94.348 kg (208 lb)   12/25/14 94.53 kg (208 lb 6.4 oz)       Intake/Output Summary (Last 24 hours) at 12/31/14 1209  Last data filed at 12/31/14 0557   Gross per 24 hour   Intake   2300 ml   Output      0 ml   Net   2300 ml     Patient is alert oriented afebrile. Skin no diaphoresis or rash. HEENT wears glasses. Conjunctiva clear. Cranial nerves II through XII grossly intact. No oral thrush or ulcers. Neck supple no adenopathy or JVD. Lung fields are clear. Back no focal tenderness. Heart reveals a rhythm that is regular without a murmur or rub. No S3. Abdomen soft nontender cannot feel spleen tip. Liver span is approximately 10 cm. Bowel sounds active no bruits. Extremities he has ulceration on the left foot medial aspect first MTPJ area with small amount of drainage. There is 1+ edema dorsum of the foot. There is no ascending cellulitis to the ankle or into the calf. No calf tenderness nor is her tenderness along  saphenous system of the left thigh. There is no tenderness or enlarged lymph nodes in the left groin. Sensation is decreased in his feet. Dorsalis pedis pulse palpable      Meds:     Current Facility-Administered Medications   Medication Dose Route Frequency   . aspirin  81 mg Oral Daily   . atorvastatin  10 mg Oral Daily   . ceFAZolin  1 g Intravenous Q8H   . collagenase   Topical Daily   . enoxaparin  40 mg Subcutaneous Q24H   . famotidine  20 mg Oral Q12H SCH   . gabapentin  100 mg Oral QHS   . heparin FLUSH  3-5 mL Intracatheter Daily   . insulin aspart  7 Units Subcutaneous TID AC   . insulin detemir  20 Units Subcutaneous QAM   . lactobacillus species  50 Billion CFU Oral Q24H   . metFORMIN  1,000 mg Oral BID Meals   . sodium chloride (PF)  10 mL Intracatheter Daily     acetaminophen **OR** acetaminophen **OR** acetaminophen, cyclobenzaprine, dextrose, dextrose, insulin aspart, insulin aspart, sodium chloride (PF)           Labs:     Labs (last 24 hours):  Results     Procedure Component Value Units Date/Time    Dextrose Stick Glucose [id number]  (Abnormal) Collected:  12/31/14 0711    Specimen Information:  Blood Updated:  12/31/14 0739     Glucose, POCT 151 (H) mg/dL     Dextrose Stick Glucose [id number]  (Abnormal) Collected:  12/30/14 2047    Specimen Information:  Blood Updated:  12/30/14 2103     Glucose, POCT 191 (H) mg/dL     Dextrose Stick Glucose [id number]  (Abnormal) Collected:  12/30/14 1654    Specimen Information:  Blood Updated:  12/30/14 1712     Glucose, POCT 173 (H) mg/dL     Dextrose Stick Glucose [id number]  (Abnormal) Collected:  12/30/14 1152    Specimen Information:  Blood Updated:  12/30/14 1227     Glucose, POCT 201 (H) mg/dL  Dextrose Stick Glucose [id number]  (Abnormal) Collected:  12/30/14 0727    Specimen Information:  Blood Updated:  12/30/14 0751     Glucose, POCT 209 (H) mg/dL     Dextrose Stick Glucose [id number]  (Abnormal) Collected:  12/29/14 2121    Specimen  Information:  Blood Updated:  12/29/14 2144     Glucose, POCT 233 (H) mg/dL     Dextrose Stick Glucose [id number]  (Abnormal) Collected:  12/29/14 1733    Specimen Information:  Blood Updated:  12/29/14 1749     Glucose, POCT 245 (H) mg/dL     Dextrose Stick Glucose [id number]  (Abnormal) Collected:  12/29/14 1155    Specimen Information:  Blood Updated:  12/29/14 1212     Glucose, POCT 233 (H) mg/dL     Dextrose Stick Glucose [id number]  (Abnormal) Collected:  12/29/14 0757    Specimen Information:  Blood Updated:  12/29/14 0813     Glucose, POCT 153 (H) mg/dL     Basic Metabolic Panel [id number]  (Abnormal) Collected:  12/29/14 0440    Specimen Information:  Plasma Updated:  12/29/14 0547     Sodium 137 mMol/L      Potassium 4.5 mMol/L      Chloride 102 mMol/L      CO2 27.1 mMol/L      Calcium 9.0 mg/dL      Glucose 144 (H) mg/dL      Creatinine 1.04 mg/dL      BUN 21 mg/dL      Anion Gap 12.4 mMol/L      BUN/Creatinine Ratio 20.2 Ratio      EGFR >60 mL/min/1.32m2      Osmolality Calc 279 mOsm/kg     CBC and differential [id number] Collected:  12/29/14 0440    Specimen Information:  Blood from Blood Updated:  12/29/14 0527     WBC 8.9 K/cmm      RBC 4.36 M/cmm      Hemoglobin 13.5 gm/dL      Hematocrit 39.2 %      MCV 90 fL      MCH 31 pg      MCHC 35 gm/dL      RDW 12.4 %      PLT CT 284 K/cmm      MPV 8.0 fL      NEUTROPHIL % 60.5 %      Lymphocytes 28.0 %      Monocytes 7.8 %      Eosinophils % 2.8 %      Basophils % 1.0 %      Neutrophils Absolute 5.4 K/cmm      Lymphocytes Absolute 2.5 K/cmm      Monocytes Absolute 0.7 K/cmm      Eosinophils Absolute 0.2 K/cmm      BASO Absolute 0.1 K/cmm     Dextrose Stick Glucose [id number]  (Abnormal) Collected:  12/28/14 2104    Specimen Information:  Blood Updated:  12/28/14 2121     Glucose, POCT 170 (H) mg/dL     Dextrose Stick Glucose [id number]  (Abnormal) Collected:  12/28/14 1656    Specimen Information:  Blood Updated:  12/28/14 1712     Glucose, POCT 248  (H) mg/dL     Dextrose Stick Glucose [id number]  (Abnormal) Collected:  12/28/14 1235    Specimen Information:  Blood Updated:  12/28/14 1251     Glucose, POCT 210 (H) mg/dL     Basic Metabolic Panel [id number]  (Abnormal) Collected:  12/28/14  0732    Specimen Information:  Plasma Updated:  12/28/14 0847     Sodium 137 mMol/L      Potassium 4.6 mMol/L      Chloride 103 mMol/L      CO2 25.5 mMol/L      Calcium 9.3 mg/dL      Glucose 168 (H) mg/dL      Creatinine 1.06 mg/dL      BUN 24 (H) mg/dL      Anion Gap 13.1 mMol/L      BUN/Creatinine Ratio 22.6 Ratio      EGFR >60 mL/min/1.98m2      Osmolality Calc 282 mOsm/kg     CBC and differential [id number] Collected:  12/28/14 0732    Specimen Information:  Blood from Blood Updated:  12/28/14 0838     WBC 8.5 K/cmm      RBC 4.53 M/cmm      Hemoglobin 13.8 gm/dL      Hematocrit 40.9 %      MCV 90 fL      MCH 30 pg      MCHC 34 gm/dL      RDW 12.5 %      PLT CT 289 K/cmm      MPV 7.7 fL      NEUTROPHIL % 64.7 %      Lymphocytes 24.6 %      Monocytes 7.4 %      Eosinophils % 2.5 %      Basophils % 0.9 %      Neutrophils Absolute 5.5 K/cmm      Lymphocytes Absolute 2.1 K/cmm      Monocytes Absolute 0.6 K/cmm      Eosinophils Absolute 0.2 K/cmm      BASO Absolute 0.1 K/cmm     Dextrose Stick Glucose [id number]  (Abnormal) Collected:  12/28/14 0750    Specimen Information:  Blood Updated:  12/28/14 0806     Glucose, POCT 149 (H) mg/dL     Dextrose Stick Glucose [id number]  (Abnormal) Collected:  12/27/14 2048    Specimen Information:  Blood Updated:  12/27/14 2106     Glucose, POCT 271 (H) mg/dL     Dextrose Stick Glucose [id number]  (Abnormal) Collected:  12/27/14 1602    Specimen Information:  Blood Updated:  12/27/14 1621     Glucose, POCT 213 (H) mg/dL           Imaging, reviewed and are significant for:  PICC Line Placement Single Lumen   Final Result   Successful ultrasound guided access and fluoroscopically guided left peripherally inserted central venous catheter    placement. PICC line ready for use.      ReadingStation:[id number]      MRI FOOT LEFT W WO CONTRAST   Final Result   Soft tissue ulceration of the medial aspect of the first metatarsophalangeal joint. Adjacent cellulitis and tiny 3 mm   fluid collection suggesting small abscess in the plantar soft tissues. Bone marrow signal changes in the distal first   metatarsal and metatarsal head and also first proximal phalanx, consistent with osteomyelitis.      [place]            Microbiology, reviewed and are significant for:  Microbiology Results     None          Assessment:   Acute osteomyelitis due to MSSA in a diabetic      Plan:   Cefazolin 2 g IV every 8 hours for 28  days of beginning 11/12; orders entered as for follow-up and laboratory studies; patient is in agreement; PICC line is already in place. He may need additional short-term disability paperwork completed which can be done at his follow-up office visit. Since this is an acute process he may be able to convert to oral antibiotic after 4 weeks IV therapy    Signed by: Hardin Negus, MD

## 2014-12-31 NOTE — Progress Notes (Signed)
Infectious Disease Medication Order for:  [name],Stephen E  DOB:  Oct 05, 1965,  49 y.o.  MRN:  [id number]  PCP:   Rhunette Croft, MD   Infection Diagnosis MSSA osteomyelitis foot     Drug 1 of  . Drug 2 of   . Drug 3 of  .   Antibiotic Name cefazolin     Route Intravenous Infusion Intravenous Infusion Intravenous Infusion   Dose & Frequency  2gm  every  8 hours.   every    hours.   every    hours.   Duration of treatment   28 days   days   days   Start Date 11/12     Labs Needed for monitoring     Weekly cbc, cmp    Estimated Creatinine Clearance: 89.6 mL/min (based on Cr of 1.04).   PICC line removal  Should PICC line be removed by Home Health after antibiotic therapy is completed? -/NO:   ID Physician will follow the patient outside the hospital? yes   Follow up at  7-10 days       Hardin Negus, MD  12/31/2014, 11:48 AM    -  By electronically signing my note, I affirm that this is a valid medical order that can be used outside the hospital.

## 2014-12-31 NOTE — Progress Notes (Addendum)
12/31/14 1457   CM Review   Case Management Assessment Status Assessment Complete;Ready for Discharge       Obtained home IV antibiotic orders from MD this afternoon.    Pt provided with choices of infusion and HHA providers and chose:  [facility] for nursing care and  [facility] for infusion services    The referral was made to the above via edc.    [facility], has met with pt this afternoon and the company plans to meet with pt this pm at his home to begin home IV antibiotic adm education via PICC line.    [facility] nurse plans to see pt on Fri 11/18 am at aprox 0830 to begin the start of care.    Pt verbalized an understanding of all of the above.  Girlfriend to provide transport home this pm    F/U appts scheduled    [place]  Orthopaedic Nurse Case Manager  [phone]

## 2015-01-01 NOTE — UM Notes (Signed)
[name] discharged home on 12/31/14.  auth # [id number]    Ivan Anchors RN UR  Phone [phone]  Fax [phone]

## 2015-01-06 ENCOUNTER — Inpatient Hospital Stay (INDEPENDENT_AMBULATORY_CARE_PROVIDER_SITE_OTHER): Payer: BC Managed Care – PPO | Admitting: Family Medicine

## 2015-01-07 ENCOUNTER — Other Ambulatory Visit (INDEPENDENT_AMBULATORY_CARE_PROVIDER_SITE_OTHER): Payer: Self-pay

## 2015-01-07 DIAGNOSIS — E1140 Type 2 diabetes mellitus with diabetic neuropathy, unspecified: Secondary | ICD-10-CM

## 2015-01-07 MED ORDER — INSULIN LISPRO 100 UNIT/ML SC SOLN
7.0000 [IU] | Freq: Three times a day (TID) | SUBCUTANEOUS | Status: DC
Start: ? — End: 2015-01-07

## 2015-01-08 ENCOUNTER — Ambulatory Visit
Admission: RE | Admit: 2015-01-08 | Discharge: 2015-01-08 | Disposition: A | Discharge status: Home / Self Care | Payer: BC Managed Care – PPO | Source: Ambulatory Visit | Attending: Internal Medicine | Admitting: Internal Medicine

## 2015-01-08 DIAGNOSIS — M86172 Other acute osteomyelitis, left ankle and foot: Secondary | ICD-10-CM | POA: Insufficient documentation

## 2015-01-08 LAB — COMPREHENSIVE METABOLIC PANEL
ALT: 20 U/L (ref 0–55)
AST (SGOT): 21 U/L (ref 10–42)
Albumin/Globulin Ratio: 1.11 Ratio (ref 0.70–1.50)
Albumin: 3.2 gm/dL — ABNORMAL LOW (ref 3.5–5.0)
Alkaline Phosphatase: 90 U/L (ref 40–145)
Anion Gap: 11.7 mMol/L (ref 7.0–18.0)
BUN / Creatinine Ratio: 21.4 Ratio (ref 10.0–30.0)
BUN: 18 mg/dL (ref 7–22)
Bilirubin, Total: 0.2 mg/dL (ref 0.1–1.2)
CO2: 29 mMol/L (ref 20.0–30.0)
Calcium: 9.2 mg/dL (ref 8.5–10.5)
Chloride: 105 mMol/L (ref 98–110)
Creatinine: 0.84 mg/dL (ref 0.80–1.30)
EGFR: >60 mL/min/{1.73_m2}
Globulin: 2.8 gm/dL (ref 2.0–4.0)
Glucose: 258 mg/dL — ABNORMAL HIGH (ref 70–99)
Osmolality Calc: 292 mOsm/kg (ref 275–300)
Potassium: 4.6 mMol/L (ref 3.5–5.3)
Protein, Total: 6 gm/dL (ref 6.0–8.3)
Sodium: 141 mMol/L (ref 136–147)

## 2015-01-08 LAB — CBC AND DIFFERENTIAL
Basophils %: 0.7 % (ref 0.0–3.0)
Basophils Absolute: 0.1 10*3/uL (ref 0.0–0.3)
Eosinophils %: 1.7 % (ref 0.0–7.0)
Eosinophils Absolute: 0.2 10*3/uL (ref 0.0–0.8)
Hematocrit: 38.4 % — ABNORMAL LOW (ref 39.0–52.5)
Hemoglobin: 12.9 gm/dL — ABNORMAL LOW (ref 13.0–17.5)
Lymphocytes Absolute: 1.9 10*3/uL (ref 0.6–5.1)
Lymphocytes: 19 % (ref 15.0–46.0)
MCH: 30 pg (ref 28–35)
MCHC: 34 gm/dL (ref 31–36)
MCV: 90 fL (ref 80–100)
MPV: 7.8 fL (ref 6.0–10.0)
Monocytes Absolute: 0.5 10*3/uL (ref 0.1–1.7)
Monocytes: 4.7 % (ref 3.0–15.0)
Neutrophils %: 73.9 % (ref 42.0–78.0)
Neutrophils Absolute: 7.4 10*3/uL (ref 1.7–8.6)
PLT CT: 218 10*3/uL (ref 130–440)
RBC: 4.29 10*6/uL (ref 4.00–5.70)
RDW: 12.3 % (ref 10.5–14.5)
WBC: 10 10*3/uL (ref 4.00–11.00)

## 2015-01-09 ENCOUNTER — Ambulatory Visit (INDEPENDENT_AMBULATORY_CARE_PROVIDER_SITE_OTHER): Payer: BC Managed Care – PPO | Admitting: Family Medicine

## 2015-01-21 ENCOUNTER — Other Ambulatory Visit
Admission: RE | Admit: 2015-01-21 | Discharge: 2015-01-21 | Disposition: A | Discharge status: Home / Self Care | Payer: BC Managed Care – PPO | Source: Ambulatory Visit | Attending: Internal Medicine | Admitting: Internal Medicine

## 2015-01-21 DIAGNOSIS — L03116 Cellulitis of left lower limb: Secondary | ICD-10-CM | POA: Insufficient documentation

## 2015-01-21 DIAGNOSIS — M86172 Other acute osteomyelitis, left ankle and foot: Secondary | ICD-10-CM | POA: Insufficient documentation

## 2015-01-21 LAB — COMPREHENSIVE METABOLIC PANEL
ALT: 10 U/L (ref 0–55)
AST (SGOT): 13 U/L (ref 10–42)
Albumin/Globulin Ratio: 1.01 Ratio (ref 0.70–1.50)
Albumin: 3.2 gm/dL — ABNORMAL LOW (ref 3.5–5.0)
Alkaline Phosphatase: 67 U/L (ref 40–145)
Anion Gap: 11.2 mMol/L (ref 7.0–18.0)
BUN / Creatinine Ratio: 26.3 Ratio (ref 10.0–30.0)
BUN: 26 mg/dL — ABNORMAL HIGH (ref 7–22)
Bilirubin, Total: 0.2 mg/dL (ref 0.1–1.2)
CO2: 26 mMol/L (ref 20.0–30.0)
Calcium: 9.1 mg/dL (ref 8.5–10.5)
Chloride: 105 mMol/L (ref 98–110)
Creatinine: 0.99 mg/dL (ref 0.80–1.30)
EGFR: >60 mL/min/{1.73_m2}
Globulin: 3.1 gm/dL (ref 2.0–4.0)
Glucose: 230 mg/dL — ABNORMAL HIGH (ref 70–99)
Osmolality Calc: 288 mOsm/kg (ref 275–300)
Potassium: 4.4 mMol/L (ref 3.5–5.3)
Protein, Total: 6.3 gm/dL (ref 6.0–8.3)
Sodium: 138 mMol/L (ref 136–147)

## 2015-01-21 LAB — CBC AND DIFFERENTIAL
Basophils %: 0.8 % (ref 0.0–3.0)
Basophils Absolute: 0.1 10*3/uL (ref 0.0–0.3)
Eosinophils %: 3 % (ref 0.0–7.0)
Eosinophils Absolute: 0.2 10*3/uL (ref 0.0–0.8)
Hematocrit: 38.6 % — ABNORMAL LOW (ref 39.0–52.5)
Hemoglobin: 12.9 gm/dL — ABNORMAL LOW (ref 13.0–17.5)
Lymphocytes Absolute: 2.1 10*3/uL (ref 0.6–5.1)
Lymphocytes: 27.5 % (ref 15.0–46.0)
MCH: 30 pg (ref 28–35)
MCHC: 33 gm/dL (ref 31–36)
MCV: 89 fL (ref 80–100)
MPV: 8.3 fL (ref 6.0–10.0)
Monocytes Absolute: 0.5 10*3/uL (ref 0.1–1.7)
Monocytes: 7.1 % (ref 3.0–15.0)
Neutrophils %: 61.8 % (ref 42.0–78.0)
Neutrophils Absolute: 4.6 10*3/uL (ref 1.7–8.6)
PLT CT: 161 10*3/uL (ref 130–440)
RBC: 4.33 10*6/uL (ref 4.00–5.70)
RDW: 13.5 % (ref 10.5–14.5)
WBC: 7.5 10*3/uL (ref 4.00–11.00)

## 2015-01-27 ENCOUNTER — Ambulatory Visit (INDEPENDENT_AMBULATORY_CARE_PROVIDER_SITE_OTHER): Payer: BC Managed Care – PPO | Admitting: Family Medicine

## 2015-02-10 ENCOUNTER — Encounter (INDEPENDENT_AMBULATORY_CARE_PROVIDER_SITE_OTHER): Payer: Self-pay | Admitting: Family Medicine

## 2015-02-10 ENCOUNTER — Ambulatory Visit (INDEPENDENT_AMBULATORY_CARE_PROVIDER_SITE_OTHER): Payer: BC Managed Care – PPO | Admitting: Family Medicine

## 2015-02-10 ENCOUNTER — Other Ambulatory Visit
Admission: RE | Admit: 2015-02-10 | Discharge: 2015-02-10 | Disposition: A | Discharge status: Home / Self Care | Payer: BC Managed Care – PPO | Source: Ambulatory Visit | Attending: Family Medicine | Admitting: Family Medicine

## 2015-02-10 VITALS — BP 134/82 | HR 98 | Temp 97.7°F | Resp 16 | Ht 65.0 in | Wt 222.4 lb

## 2015-02-10 DIAGNOSIS — N179 Acute kidney failure, unspecified: Secondary | ICD-10-CM

## 2015-02-10 DIAGNOSIS — L97529 Non-pressure chronic ulcer of other part of left foot with unspecified severity: Secondary | ICD-10-CM

## 2015-02-10 DIAGNOSIS — Z794 Long term (current) use of insulin: Secondary | ICD-10-CM

## 2015-02-10 DIAGNOSIS — E1140 Type 2 diabetes mellitus with diabetic neuropathy, unspecified: Secondary | ICD-10-CM

## 2015-02-10 DIAGNOSIS — D229 Melanocytic nevi, unspecified: Secondary | ICD-10-CM

## 2015-02-10 DIAGNOSIS — E11621 Type 2 diabetes mellitus with foot ulcer: Secondary | ICD-10-CM

## 2015-02-10 LAB — HEMOGLOBIN A1C: Hgb A1C, %: 8 %

## 2015-02-10 LAB — MICROALBUMIN, RANDOM URINE: Microalbumin-Random, U: 3425.58 ug/mL — ABNORMAL HIGH (ref 0.00–20.00)

## 2015-02-10 MED ORDER — LISINOPRIL 20 MG PO TABS
20.0000 mg | ORAL_TABLET | Freq: Every day | ORAL | Status: DC
Start: ? — End: 2015-02-10

## 2015-02-10 MED ORDER — GABAPENTIN 300 MG PO CAPS
300.0000 mg | ORAL_CAPSULE | Freq: Every evening | ORAL | Status: DC
Start: ? — End: 2015-02-10

## 2015-02-10 NOTE — Progress Notes (Signed)
Date Specimen Drawn:  02/10/2015   Time Specimen Drawn:  10:06 AM   Test(s) Ordered:  A1c,micro urine   Disposition:  na   Patient's Tolerance:  well   Location Specimen Drawn:  [facility]

## 2015-02-10 NOTE — Patient Instructions (Addendum)
1.  Call me after the January 1 and we will switch you at that time to novolog and the new lantus  2. We will check your A1C today and get you microalbumin to see if your kidneys have improved  3. Increase the dose of the lisinopril  4. Get your labs drawn a couple of days before I see you in 3 months  5. You can take 3 of the present dose of neurontin until they are gone and I put in the new prescription for you today

## 2015-02-10 NOTE — Progress Notes (Signed)
PROGRESS NOTE    Date Time: 02/10/2015 9:24 AM  Patient Name: [name]  Primary Care Physician: Rhunette Croft, MD      History of Presenting Illness:   [name] is a 49 y.o. male who presents to the office with   Chief Complaint   Patient presents with   . Diabetes     3 mo f/u   1. Diabetes mellitus - Patient is presently on levemir 20 units and humalog 7 unit ac tid.  His blood sugars have been running between 113-160 but he does not have his meter with him.  He denies polyuria or polyphagia but does have polydipsia.  No blurred vision  2. Hypertension - patient does no check his blood pressure, he is compliant with medication and diet, denies chest pain, neurological deficits, edema or sob.  No medication side effects  3. Ulcer L foot -although the patient is a diabetic the L foot ulcer was caused by the rubbing from steel toe boots, he now has a different job where he no longer has to wear that footwear.  He needs paperwork completed for [facility] which was sent to [place].  He has an appointment with podiatry tomorrow and is scheduled to follow up with ID.  He has completed his antibiotics  4. Diabetic neuropathy - the neurontin helped intially and the patient requests to increase the dose.     Past Medical History:     Past Medical History   Diagnosis Date   . Seasonal allergic rhinitis    . Diabetes mellitus    . Disc    . Hypertension    . Diabetic ulcer of left foot 12/26/14       Past Surgical History:     Past Surgical History   Procedure Laterality Date   . Hand surgery         Family History:     Family History   Problem Relation Age of Onset   . Diabetes Mother    . Depression Sister    . COPD Sister    . Hypertension Brother        Social History:     History   Smoking status   . Never Smoker    Smokeless tobacco   . Never Used     History   Alcohol Use No     History   Drug Use No       Allergies:     Allergies   Allergen Reactions   . Flu Virus Vaccine Anaphylaxis       Medications:      Prior to Admission medications    Medication Sig Start Date End Date Taking? Authorizing Provider   aspirin (ASPIRIN CHILDRENS) 81 MG chewable tablet Chew 1 tablet (81 mg total) by mouth daily. 07/24/14  Yes Rhunette Croft, MD   atorvastatin (LIPITOR) 10 MG tablet Take 1 tablet (10 mg total) by mouth daily. 10/10/14 10/10/15 Yes Rhunette Croft, MD   collagenase (SANTYL) ointment Apply topically daily. 12/25/14  Yes Rhunette Croft, MD   cyclobenzaprine (FLEXERIL) 10 MG tablet Take 1 tablet (10 mg total) by mouth every 8 (eight) hours as needed for Muscle spasms (pain or muscle spasm). 05/27/14  Yes Melynda Keller, PA   gabapentin (NEURONTIN) 100 MG capsule Take 1 capsule (100 mg total) by mouth nightly. 10/28/14  Yes Rhunette Croft, MD   insulin detemir (LEVEMIR FLEXPEN) 100 UNIT/ML injection pen Inject 20 Units into  the skin every morning. 12/31/14  Yes Timoteo Expose, MD   insulin lispro (HUMALOG) 100 UNIT/ML injection Inject 7 Units into the skin three times a day after meals. 01/07/15  Yes Rhunette Croft, MD   lisinopril (PRINIVIL,ZESTRIL) 10 MG tablet Take 1 tablet (10 mg total) by mouth daily. 07/24/14  Yes Rhunette Croft, MD   metFORMIN (GLUCOPHAGE) 1000 MG tablet Take 1 tablet (1,000 mg total) by mouth 2 (two) times daily with meals. 10/10/14  Yes Rhunette Croft, MD   insulin aspart (NOVOLOG) 100 UNIT/ML injection pen Inject 7 Units into the skin 3 (three) times daily before meals. 12/31/14   Timoteo Expose, MD   meloxicam (MOBIC) 7.5 MG tablet Take 1 tablet (7.5 mg total) by mouth daily. 05/27/14   Melynda Keller, PA   ondansetron (ZOFRAN ODT) 4 MG disintegrating tablet Take 1 tablet (4 mg total) by mouth every 8 (eight) hours as needed for Nausea. 10/07/14   Rhunette Croft, MD   silver sulfADIAZINE (SILVADENE, SSD) 1 % cream Apply to affected area daily 10/10/14 10/10/15  Rhunette Croft, MD   ceFAZolin (ANCEF) 1 G injection Inject 2 g into the  vein every 8 (eight) hours. 12/31/14 02/10/15  Timoteo Expose, MD       Review of Systems:      14 System Review negative except as noted in the history of present illness    Physical Exam:     Filed Vitals:    02/10/15 0909   BP: 134/82   Pulse: 98   Temp: 97.7 F (36.5 C)   Resp: 16   SpO2: 97%     Body mass index is 37.01 kg/(m^2).    General: awake, alert, oriented x 3; no acute distress.  HEENT: eomi, sclera anicteric, oropharynx clear without lesions, mucous membranes moist, normocephalic, atraumatic, Fundiscopic exam with sharp disc margins, no exudate or lesions, no av crossing defects and no copper wiring.  Neck: supple, no lymphadenopathy, no thyromegaly, no JVD, no carotid bruits, trachea midline  Cardiovascular: regular rate and rhythm, no murmurs, rubs or gallops  Lungs: clear to auscultation bilaterally, without wheezing, rhonchi, or rales, resp even and unlabored with normal effort  Abdomen: soft, non-tender, non-distended; no palpable masses, no hepatosplenomegaly, normoactive bowel sounds, no rebound or guarding  Extremities: no clubbing, cyanosis, or edema  Musculoskeletal: No effusion or deformity  Neuro: cranial nerves grossly intact,  Skin: L lateral MTP with superficial ulcer with good granulation tissue and no pain erythema, odor or warmth.  No fluctuance,  Some devitalized tissue surrounding the ulcer, markedly improved  Psych: Affect and mood are congruent, well groomed, good eye contact  Other:       Assessment:     1. Type 2 diabetes mellitus with diabetic neuropathy, unspecified long term insulin use status    2. Type 2 diabetes mellitus with diabetic neuropathy, with long-term current use of insulin    3. Atypical nevus    4. AKI (acute kidney injury)    5. Diabetic ulcer of left foot associated with type 2 diabetes mellitus         Plan:     Orders Placed This Encounter   Procedures   . Hemoglobin A1C     Standing Status: Future      Number of Occurrences: 1      Standing Expiration  Date: 02/11/2016   . Microalbumin, Random Urine     Standing Status: Future  Number of Occurrences: 1      Standing Expiration Date: 02/11/2016   . Comprehensive metabolic panel     Standing Status: Future      Number of Occurrences:       Standing Expiration Date: 02/10/2016     Order Specific Question:  Has the patient fasted?     Answer:  No   . Hemoglobin A1C     Standing Status: Future      Number of Occurrences:       Standing Expiration Date: 02/11/2016   . Ambulatory referral to Dermatology     Referral Priority:  Routine     Referral Type:  Consultation     Referral Reason:  Specialty Services Required     Requested Specialty:  Dermatology     Number of Visits Requested:  1        Requested Prescriptions     Signed Prescriptions Disp Refills   . lisinopril (PRINIVIL,ZESTRIL) 20 MG tablet 90 tablet 3     Sig: Take 1 tablet (20 mg total) by mouth daily.   Marland Kitchen gabapentin (NEURONTIN) 300 MG capsule 90 capsule 3     Sig: Take 1 capsule (300 mg total) by mouth nightly.       Return in about 3 months (around 05/11/2015).    Patient Instructions   1.  Call me after the January 1 and we will switch you at that time to novolog and the new lantus  2. We will check your A1C today and get you microalbumin to see if your kidneys have improved  3. Increase the dose of the lisinopril  4. Get your labs drawn a couple of days before I see you in 3 months  5. You can take 3 of the present dose of neurontin until they are gone and I put in the new prescription for you today       Patient was counseled on possible medicine side effects which may include rash, swelling and/or stomach upset.  Patient was instructed to notify me or the ER if they experience problems.    Discussed with patient all diagnostics and consults ordered and addressed patient concerns prior to the completion of the visit.  Patient will be notified of results when they become available.  Encouraged to call for results if no contact within 14 days.     The  patient's electronic medical record was reviewed, any changes in the past medical history, past surgical history, medications, diagnostic tests were noted, and the record was updated accordingly.    Discussed option available for my chart which provides electronic access to diagnostic results.        Signed by: Rhunette Croft, MD

## 2015-02-12 ENCOUNTER — Other Ambulatory Visit (INDEPENDENT_AMBULATORY_CARE_PROVIDER_SITE_OTHER): Payer: Self-pay | Admitting: Family Medicine

## 2015-02-12 DIAGNOSIS — Z794 Long term (current) use of insulin: Secondary | ICD-10-CM

## 2015-02-12 MED ORDER — INSULIN PEN NEEDLE 31G X 8 MM MISC
Status: DC
Start: ? — End: 2015-02-12

## 2015-02-12 MED ORDER — GABAPENTIN 300 MG PO CAPS
300.0000 mg | ORAL_CAPSULE | Freq: Three times a day (TID) | ORAL | Status: DC
Start: ? — End: 2015-02-12

## 2015-02-12 NOTE — Telephone Encounter (Signed)
Per [facility] the rx needs to be changed to  gabapentin 300mg  1 tab tid # 90 with 3 rf.

## 2015-02-12 NOTE — Telephone Encounter (Deleted)
Patient was put on neurontin 300mg  was taken daily, you advised could take 3 daily a rx was sent for #90 how ever pharmacy only gave # 30,patent will call pharmacy we donot need to do anythin at this time.

## 2015-02-17 ENCOUNTER — Telehealth (INDEPENDENT_AMBULATORY_CARE_PROVIDER_SITE_OTHER): Payer: Self-pay | Admitting: Family Medicine

## 2015-02-17 DIAGNOSIS — Z794 Long term (current) use of insulin: Secondary | ICD-10-CM

## 2015-02-17 DIAGNOSIS — E1142 Type 2 diabetes mellitus with diabetic polyneuropathy: Secondary | ICD-10-CM

## 2015-02-17 NOTE — Telephone Encounter (Signed)
Patient said he talked to you over weekend and you will do his paperwork that you have,also he needs a rx for the 2 new  meds for his DM.one is novolog,he said the other was a brand new med.

## 2015-02-20 MED ORDER — INSULIN ASPART 100 UNIT/ML SC SOPN
7.0000 [IU] | PEN_INJECTOR | Freq: Three times a day (TID) | SUBCUTANEOUS | Status: DC
Start: ? — End: 2015-02-20

## 2015-02-20 MED ORDER — INSULIN GLARGINE 100 UNIT/ML SC SOPN
20.0000 [IU] | PEN_INJECTOR | Freq: Every evening | SUBCUTANEOUS | Status: DC
Start: ? — End: 2015-02-20

## 2015-02-20 NOTE — Telephone Encounter (Signed)
Please let the patient know that I sent in the medication and make sure that he got his paperwork

## 2015-02-26 ENCOUNTER — Telehealth (INDEPENDENT_AMBULATORY_CARE_PROVIDER_SITE_OTHER): Payer: Self-pay | Admitting: Family Medicine

## 2015-02-26 NOTE — Telephone Encounter (Signed)
Please let him know that I did fill out forms one time but will look for further paperwork from the insurance.  Thanks!

## 2015-02-26 NOTE — Telephone Encounter (Signed)
Patient called to let us know we will be getting paper work from his insurance.It needs to be on the forms that his ucler was not preexisting.

## 2015-03-04 NOTE — Telephone Encounter (Signed)
Patient aware.

## 2015-03-10 ENCOUNTER — Emergency Department: Payer: BC Managed Care – PPO

## 2015-03-10 ENCOUNTER — Observation Stay: Payer: BC Managed Care – PPO

## 2015-03-10 ENCOUNTER — Inpatient Hospital Stay
Admission: EM | Admit: 2015-03-10 | Discharge: 2015-03-17 | DRG: 623 | Disposition: A | Discharge status: Home Health | Payer: BC Managed Care – PPO | Attending: Internal Medicine | Admitting: Internal Medicine

## 2015-03-10 ENCOUNTER — Inpatient Hospital Stay: Payer: BC Managed Care – PPO | Admitting: Internal Medicine

## 2015-03-10 DIAGNOSIS — Z818 Family history of other mental and behavioral disorders: Secondary | ICD-10-CM

## 2015-03-10 DIAGNOSIS — J302 Other seasonal allergic rhinitis: Secondary | ICD-10-CM | POA: Diagnosis present

## 2015-03-10 DIAGNOSIS — E1169 Type 2 diabetes mellitus with other specified complication: Principal | ICD-10-CM | POA: Diagnosis present

## 2015-03-10 DIAGNOSIS — Z833 Family history of diabetes mellitus: Secondary | ICD-10-CM

## 2015-03-10 DIAGNOSIS — B951 Streptococcus, group B, as the cause of diseases classified elsewhere: Secondary | ICD-10-CM | POA: Diagnosis present

## 2015-03-10 DIAGNOSIS — E11628 Type 2 diabetes mellitus with other skin complications: Secondary | ICD-10-CM | POA: Diagnosis present

## 2015-03-10 DIAGNOSIS — M86172 Other acute osteomyelitis, left ankle and foot: Secondary | ICD-10-CM | POA: Diagnosis present

## 2015-03-10 DIAGNOSIS — Z794 Long term (current) use of insulin: Secondary | ICD-10-CM

## 2015-03-10 DIAGNOSIS — L97509 Non-pressure chronic ulcer of other part of unspecified foot with unspecified severity: Secondary | ICD-10-CM

## 2015-03-10 DIAGNOSIS — E669 Obesity, unspecified: Secondary | ICD-10-CM | POA: Diagnosis present

## 2015-03-10 DIAGNOSIS — Z7984 Long term (current) use of oral hypoglycemic drugs: Secondary | ICD-10-CM

## 2015-03-10 DIAGNOSIS — E11621 Type 2 diabetes mellitus with foot ulcer: Secondary | ICD-10-CM | POA: Diagnosis present

## 2015-03-10 DIAGNOSIS — E1142 Type 2 diabetes mellitus with diabetic polyneuropathy: Secondary | ICD-10-CM | POA: Diagnosis present

## 2015-03-10 DIAGNOSIS — Z887 Allergy status to serum and vaccine status: Secondary | ICD-10-CM

## 2015-03-10 DIAGNOSIS — K029 Dental caries, unspecified: Secondary | ICD-10-CM | POA: Diagnosis present

## 2015-03-10 DIAGNOSIS — E1165 Type 2 diabetes mellitus with hyperglycemia: Secondary | ICD-10-CM | POA: Diagnosis present

## 2015-03-10 DIAGNOSIS — M869 Osteomyelitis, unspecified: Secondary | ICD-10-CM

## 2015-03-10 DIAGNOSIS — Z6836 Body mass index (BMI) 36.0-36.9, adult: Secondary | ICD-10-CM

## 2015-03-10 DIAGNOSIS — E785 Hyperlipidemia, unspecified: Secondary | ICD-10-CM | POA: Diagnosis present

## 2015-03-10 DIAGNOSIS — I10 Essential (primary) hypertension: Secondary | ICD-10-CM | POA: Diagnosis present

## 2015-03-10 DIAGNOSIS — Z8249 Family history of ischemic heart disease and other diseases of the circulatory system: Secondary | ICD-10-CM

## 2015-03-10 DIAGNOSIS — Z825 Family history of asthma and other chronic lower respiratory diseases: Secondary | ICD-10-CM

## 2015-03-10 DIAGNOSIS — M659 Synovitis and tenosynovitis, unspecified: Secondary | ICD-10-CM | POA: Diagnosis present

## 2015-03-10 DIAGNOSIS — L03116 Cellulitis of left lower limb: Secondary | ICD-10-CM | POA: Diagnosis present

## 2015-03-10 DIAGNOSIS — L97529 Non-pressure chronic ulcer of other part of left foot with unspecified severity: Secondary | ICD-10-CM | POA: Diagnosis present

## 2015-03-10 HISTORY — DX: Non-pressure chronic ulcer of other part of unspecified foot with unspecified severity: L97.509

## 2015-03-10 LAB — BASIC METABOLIC PANEL
Anion Gap: 15.4 mMol/L (ref 7.0–18.0)
BUN / Creatinine Ratio: 20.7 Ratio (ref 10.0–30.0)
BUN: 31 mg/dL — ABNORMAL HIGH (ref 7–22)
CO2: 26.3 mMol/L (ref 20.0–30.0)
Calcium: 9.4 mg/dL (ref 8.5–10.5)
Chloride: 99 mMol/L (ref 98–110)
Creatinine: 1.5 mg/dL — ABNORMAL HIGH (ref 0.80–1.30)
EGFR: 49 mL/min/{1.73_m2}
Glucose: 435 mg/dL — ABNORMAL HIGH (ref 70–99)
Osmolality Calc: 297 mOsm/kg (ref 275–300)
Potassium: 4.7 mMol/L (ref 3.5–5.3)
Sodium: 136 mMol/L (ref 136–147)

## 2015-03-10 LAB — CBC AND DIFFERENTIAL
Basophils %: 0.3 % (ref 0.0–3.0)
Basophils Absolute: 0 10*3/uL (ref 0.0–0.3)
Eosinophils %: 1.5 % (ref 0.0–7.0)
Eosinophils Absolute: 0.2 10*3/uL (ref 0.0–0.8)
Hematocrit: 40 % (ref 39.0–52.5)
Hemoglobin: 13.3 gm/dL (ref 13.0–17.5)
Lymphocytes Absolute: 1.5 10*3/uL (ref 0.6–5.1)
Lymphocytes: 12.3 % — ABNORMAL LOW (ref 15.0–46.0)
MCH: 31 pg (ref 28–35)
MCHC: 33 gm/dL (ref 32–36)
MCV: 94 fL (ref 80–100)
MPV: 9.2 fL (ref 6.0–10.0)
Monocytes Absolute: 0.7 10*3/uL (ref 0.1–1.7)
Monocytes: 6.1 % (ref 3.0–15.0)
Neutrophils %: 79.8 % — ABNORMAL HIGH (ref 42.0–78.0)
Neutrophils Absolute: 9.5 10*3/uL — ABNORMAL HIGH (ref 1.7–8.6)
PLT CT: 222 10*3/uL (ref 130–440)
RBC: 4.26 10*6/uL (ref 4.00–5.70)
RDW: 12.2 % (ref 11.0–14.0)
WBC: 12 10*3/uL — ABNORMAL HIGH (ref 4.0–11.0)

## 2015-03-10 LAB — C-REACTIVE PROTEIN: C-Reactive Protein: 17.07 mg/dL — ABNORMAL HIGH (ref 0.02–0.80)

## 2015-03-10 LAB — VH DEXTROSE STICK GLUCOSE: Glucose POCT: 336 mg/dL — ABNORMAL HIGH (ref 70–99)

## 2015-03-10 LAB — SEDIMENTATION RATE: Sed Rate: 74 mm/hr — ABNORMAL HIGH (ref 0–20)

## 2015-03-10 MED ORDER — KETOROLAC TROMETHAMINE 15 MG/ML IJ SOLN
INTRAMUSCULAR | Status: AC
Start: 2015-03-10 — End: ?
  Filled 2015-03-10: qty 1

## 2015-03-10 MED ORDER — ACETAMINOPHEN 160 MG/5ML PO SOLN
650.0000 mg | ORAL | Status: DC | PRN
Start: 2015-03-10 — End: 2015-03-17

## 2015-03-10 MED ORDER — ENOXAPARIN SODIUM 40 MG/0.4ML SC SOLN
40.0000 mg | SUBCUTANEOUS | Status: DC
Start: 2015-03-10 — End: 2015-03-17
  Administered 2015-03-10 – 2015-03-16 (×7): 40 mg via SUBCUTANEOUS
  Filled 2015-03-10 (×8): qty 0.4

## 2015-03-10 MED ORDER — SODIUM CHLORIDE 0.9 % IJ SOLN
0.4000 mg | INTRAMUSCULAR | Status: DC | PRN
Start: 2015-03-10 — End: 2015-03-17

## 2015-03-10 MED ORDER — GADOBUTROL 1 MMOL/ML IV SOLN
10.0000 mL | Freq: Once | INTRAVENOUS | Status: AC | PRN
Start: 2015-03-10 — End: 2015-03-10
  Administered 2015-03-10: 10 mmol via INTRAVENOUS

## 2015-03-10 MED ORDER — SODIUM CHLORIDE 0.9 % IV SOLN
3.0000 g | Freq: Four times a day (QID) | INTRAVENOUS | Status: DC
Start: 2015-03-10 — End: 2015-03-11
  Administered 2015-03-10: 3 g via INTRAVENOUS

## 2015-03-10 MED ORDER — OXYCODONE-ACETAMINOPHEN 5-325 MG PO TABS
1.0000 | ORAL_TABLET | ORAL | Status: DC | PRN
Start: 2015-03-10 — End: 2015-03-17
  Administered 2015-03-11: 1 via ORAL
  Administered 2015-03-12: 2 via ORAL
  Filled 2015-03-10: qty 1
  Filled 2015-03-10: qty 2

## 2015-03-10 MED ORDER — ACETAMINOPHEN 325 MG PO TABS
650.0000 mg | ORAL_TABLET | ORAL | Status: DC | PRN
Start: 2015-03-10 — End: 2015-03-17
  Administered 2015-03-13: 650 mg via ORAL
  Filled 2015-03-10: qty 2

## 2015-03-10 MED ORDER — ONDANSETRON HCL 4 MG/2ML IJ SOLN
4.0000 mg | Freq: Three times a day (TID) | INTRAMUSCULAR | Status: DC | PRN
Start: 2015-03-10 — End: 2015-03-12

## 2015-03-10 MED ORDER — KETOROLAC TROMETHAMINE 15 MG/ML IJ SOLN
15.0000 mg | Freq: Once | INTRAMUSCULAR | Status: AC
Start: 2015-03-10 — End: 2015-03-10
  Administered 2015-03-10: 15 mg via INTRAVENOUS

## 2015-03-10 MED ORDER — AMPICILLIN-SULBACTAM SODIUM 3 (2-1) G IJ SOLR
INTRAMUSCULAR | Status: AC
Start: 2015-03-10 — End: ?
  Filled 2015-03-10: qty 20

## 2015-03-10 MED ORDER — ONDANSETRON 4 MG PO TBDP
4.0000 mg | ORAL_TABLET | Freq: Three times a day (TID) | ORAL | Status: DC | PRN
Start: 2015-03-10 — End: 2015-03-12

## 2015-03-10 MED ORDER — SODIUM CHLORIDE 0.9 % IV BOLUS
1000.0000 mL | Freq: Once | INTRAVENOUS | Status: AC
Start: 2015-03-10 — End: 2015-03-10
  Administered 2015-03-10: 1000 mL via INTRAVENOUS

## 2015-03-10 MED ORDER — ACETAMINOPHEN 650 MG RE SUPP
650.0000 mg | RECTAL | Status: DC | PRN
Start: 2015-03-10 — End: 2015-03-17

## 2015-03-10 NOTE — ED Provider Notes (Signed)
[facility] EMERGENCY DEPARTMENT   History and Physical Exam      Patient Name: [name], [name]  Encounter Date:  03/10/2015  Attending Physician: Geryl Councilman, MD  PCP: Rhunette Croft, MD  Patient DOB:  May 29, 1965  MRN:  [id number]  Room:  S22/S22-A      History of Presenting Illness     Chief complaint: Wound Infection    HPI/ROS is limited by: none  HPI/ROS given by: patient    Location: left foot  Duration: 4 days  Severity: moderate          Nursing Notes reviewed and acknowledged.    [name] is a 50 y.o. male who presents with wound infection. Pt is a type II insulin dependant diabetic. He has had a chronic ulcer to his left foot for several months that he was on a PICC for in November/December. It was doing very well until 3 days ago it became swollen, red, and painful with a clear drainage. No n/v/d. He has had a low grade fever. His sugars have been running in the 100-200 range at home. He started cipro 3 days ago when this began but it doesn't seem to have improved.      Review of Systems     Review of Systems   Constitutional: Positive for fever. Negative for chills, diaphoresis, activity change and appetite change.   HENT: Negative for congestion, ear pain, sore throat and trouble swallowing.    Eyes: Negative for pain, discharge, itching and visual disturbance.   Respiratory: Negative for cough and shortness of breath.    Cardiovascular: Positive for leg swelling (left foot). Negative for chest pain.   Gastrointestinal: Negative for nausea, vomiting, abdominal pain, diarrhea and abdominal distention.   Genitourinary: Negative for dysuria, frequency, hematuria, decreased urine volume and difficulty urinating.   Musculoskeletal: Positive for arthralgias (left foot). Negative for myalgias, back pain and neck pain.   Skin: Positive for color change (redness at wound) and wound. Negative for pallor and rash.   Neurological: Negative for dizziness, weakness, light-headedness, numbness and  headaches.   Hematological: Does not bruise/bleed easily.   Psychiatric/Behavioral: Negative for suicidal ideas and behavioral problems.   All other systems reviewed and are negative.       Allergies     Pt is allergic to flu virus vaccine.    Medications     Current Outpatient Rx   Name  Route  Sig  Dispense  Refill   . aspirin (ASPIRIN CHILDRENS) 81 MG chewable tablet    Oral    Chew 1 tablet (81 mg total) by mouth daily.    90 tablet    0     . atorvastatin (LIPITOR) 10 MG tablet    Oral    Take 10 mg by mouth nightly.             . ciprofloxacin (CIPRO) 500 MG tablet    Oral    Take 500 mg by mouth 2 (two) times daily. Started 10 day therapy 03/08/15             . collagenase (SANTYL) ointment    Topical    Apply topically daily.    30 g    0     . gabapentin (NEURONTIN) 300 MG capsule    Oral    Take 1 capsule (300 mg total) by mouth 3 (three) times daily.    270 capsule    3     . insulin  aspart (NOVOLOG FLEXPEN) 100 UNIT/ML injection pen    Subcutaneous    Inject 7 Units into the skin 3 (three) times daily before meals.    6 pen    3     . Insulin Detemir (LEVEMIR FLEXPEN SC)    Subcutaneous    Inject 20 Units into the skin every morning. Switch to 20 units of Lantus every morning once Levemir supply is depleted               . lisinopril (PRINIVIL,ZESTRIL) 20 MG tablet    Oral    Take 20 mg by mouth nightly.             . metFORMIN (GLUCOPHAGE) 1000 MG tablet    Oral    Take 1 tablet (1,000 mg total) by mouth 2 (two) times daily with meals.    180 tablet    3     . oxymetazoline (AFRIN) 0.05 % nasal spray    Nasal    2 sprays by Nasal route 2 (two) times daily as needed for Congestion.             Marland Kitchen DISCONTD: atorvastatin (LIPITOR) 10 MG tablet    Oral    Take 1 tablet (10 mg total) by mouth daily.  Patient taking differently: Take 10 mg by mouth nightly.       90 tablet    3     . DISCONTD: lisinopril (PRINIVIL,ZESTRIL) 20 MG tablet    Oral    Take 1 tablet (20 mg total) by mouth daily.  Patient taking  differently: Take 20 mg by mouth nightly.       90 tablet    3     . cyclobenzaprine (FLEXERIL) 10 MG tablet    Oral    Take 1 tablet (10 mg total) by mouth every 8 (eight) hours as needed for Muscle spasms (pain or muscle spasm).    20 tablet    0     . DISCONTD: insulin glargine (LANTUS SOLOSTAR) 100 UNIT/ML injection pen    Subcutaneous    Inject 20 Units into the skin nightly.    6 mL    3     . DISCONTD: Insulin Pen Needle 31G X 8 MM Misc        As instructed    100 each    11     . DISCONTD: meloxicam (MOBIC) 7.5 MG tablet    Oral    Take 1 tablet (7.5 mg total) by mouth daily.    14 tablet    0     . DISCONTD: ondansetron (ZOFRAN ODT) 4 MG disintegrating tablet    Oral    Take 1 tablet (4 mg total) by mouth every 8 (eight) hours as needed for Nausea.    20 tablet    0     . DISCONTD: silver sulfADIAZINE (SILVADENE, SSD) 1 % cream        Apply to affected area daily    50 g    1          Past Medical History     Pt has a past medical history of Seasonal allergic rhinitis; Diabetes mellitus; Disc; Hypertension; and Diabetic ulcer of left foot (12/26/14).    Past Surgical History     Pt has past surgical history that includes Hand surgery.    Family History     The family history includes COPD in his sister; Depression in his sister;  Diabetes in his mother; Hypertension in his brother.    Social History     Pt reports that he has never smoked. He has never used smokeless tobacco. He reports that he does not drink alcohol or use illicit drugs.    Physical Exam     Blood pressure 142/88, pulse 112, temperature 99.3 F (37.4 C), temperature source Oral, resp. rate 16, weight 101.9 kg, SpO2 97 %.    Constitutional: Vital signs reviewed. Well appearing. Well nourished. No acute distress  Head: Normocephalic, atraumatic  Eyes: PERRL. EOMI. Conjunctiva and sclera are normal.  No injection or discharge.  Ears, Nose, Throat:  Airway Patent. Normal external examination of the nose and ears.  Neck: Supple. Trachea  midline. Normal range of motion. Non-tender.   Respiratory/Chest: No respiratory distress. Breath sounds clear to auscultation bilaterally. No wheezes, rales or rhonchi.  Cardiac: Regular rate and rhythm. No murmurs, rub or gallop.   Abdomen: Soft, nontender, nondistended. Bowel sounds normal active.   Back: No deformity, No CVAT, No Spinal or Paraspinous tenderness to palpation.  Extremities: Diabetic ulcer to left foot at base of great toe with surrounding cellulitis and tenderness with clear drainage. Diffuse edema of left foot. No cyanosis. Positive pulses with good capillary refill bilaterally and symmetrically.  Neurological: Alert and conversant.  Normal mental status. Visual fields intact. Face symmetric.  Speech fluid.  No drift.  Strength 5/5 UE and LE bilaterally.  Skin: Warm, dry. No rash.  Psychiatric: Alert and conversant.  Normal affect.  Normal insight.    Orders Placed     Orders Placed This Encounter   Procedures   . Culture Wound   . XR FOOT LEFT AP LATERAL AND OBLIQUE   . CBC   . Basic Metabolic Panel   . ESR   . CRP   . Place  for Observation Services   . Bed Request   . ED Admission Request       Diagnostic Results       The results of the diagnostic studies below have been reviewed by myself:    Labs  Results     Procedure Component Value Units Date/Time    ESR [id number] Collected:  03/10/15 1919    Specimen Information:  Blood Updated:  03/10/15 2023    CRP [id number] Collected:  03/10/15 1919    Specimen Information:  Plasma Updated:  03/10/15 [id number]    Basic Metabolic Panel [id number]  (Abnormal) Collected:  03/10/15 1919    Specimen Information:  Plasma Updated:  03/10/15 1953     Sodium 136 mMol/L      Potassium 4.7 mMol/L      Chloride 99 mMol/L      CO2 26.3 mMol/L      Calcium 9.4 mg/dL      Glucose 435 (H) mg/dL      Creatinine 1.50 (H) mg/dL      BUN 31 (H) mg/dL      Anion Gap 15.4 mMol/L      BUN/Creatinine Ratio 20.7 Ratio      EGFR 49 mL/min/1.29m2      Osmolality Calc 297  mOsm/kg     CBC [[id number]]  (Abnormal) Collected:  03/10/15 1919    Specimen Information:  Blood from Blood Updated:  03/10/15 1944     WBC 12.0 (H) K/cmm      RBC 4.26 M/cmm      Hemoglobin 13.3 gm/dL      Hematocrit 40.0 %  MCV 94 fL      MCH 31 pg      MCHC 33 gm/dL      RDW 12.2 %      PLT CT 222 K/cmm      MPV 9.2 fL      NEUTROPHIL % 79.8 (H) %      Lymphocytes 12.3 (L) %      Monocytes 6.1 %      Eosinophils % 1.5 %      Basophils % 0.3 %      Neutrophils Absolute 9.5 (H) K/cmm      Lymphocytes Absolute 1.5 K/cmm      Monocytes Absolute 0.7 K/cmm      Eosinophils Absolute 0.2 K/cmm      BASO Absolute 0.0 K/cmm           Radiologic Studies  Radiology Results (24 Hour)     Procedure Component Value Units Date/Time    XR FOOT LEFT AP LATERAL AND OBLIQUE [id number] Collected:  03/10/15 2044    Order Status:  Completed Updated:  03/10/15 2050    Narrative:      Clinical History:  50 year old with pain. Left foot infection and fever.    Examination:  Left foot 3 views March 10, 2015.    Comparison:  Prior radiographs December 25, 2014 and October 10, 2014. Correlation with MRI December 27, 2014.    Findings:  AP, lateral, and oblique views of the left foot are provided. Each is technically limited, with suboptimal evaluation of  the soft tissues at the digits. There is asymmetric soft tissue thickening at the medial aspect of the great toe,  centered over the MTP joint. A shallow defect is suggested on the AP view, 9 mm craniocaudal. There are tiny  calcifications at the base of the defect, and there has been progressive periarticular erosion at the medial aspect of  the metatarsal head. There is chronic deformity of the head of the first proximal phalanx, and mild hallux valgus. 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 Mineralized os peroneum is incidentally noted. There is chronic calcific  enthesopathy at the Achilles insertion.      Impression:      Recurrent soft tissue thickening and  ulceration at the medial aspect of the left first MTP joint, with progressive  erosion of the metatarsal head favoring osteomyelitis in this context over gout.    ReadingStation:[id number]          EKG: none        Medications Given In ED       ED Medication Orders     Start Ordered     Status Ordering Provider    03/10/15 2010 03/10/15 2009  ketorolac (TORADOL) injection 15 mg   Once in ED     Route: Intravenous  Ordered Dose: 15 mg     Last MAR action:  Given Lacinda Axon MARIE    03/10/15 2007 03/10/15 2006  ampicillin-sulbactam (UNASYN) 3 g in sodium chloride 0.9 % 100 mL IVPB   Every 6 hours     Route: Intravenous  Ordered Dose: 3 g     Last MAR action:  New Bag Lacinda Axon MARIE    03/10/15 2006 03/10/15 2005  sodium chloride 0.9 % bolus 1,000 mL   Once in ED     Route: Intravenous  Ordered Dose: 1,000 mL     Last MAR action:  New Bag Teah [place], [place]  MDM and Assessment   Wound culture completed and sent. IV antibiotics started.    Called Dr. Vern Claude at 8:07 PM   for consultation to discuss the disposition of this patient.   Dr. Wallace Going will be admitting the patient to the ED Observation Unit.              Procedures             Diagnosis / Disposition     Clinical Impression  1. Diabetic ulcer of left foot associated with type 2 diabetes mellitus    2. Cellulitis of left foot    3. Osteomyelitis of left foot, unspecified chronicity        Disposition  ED Disposition     Observation Admitting Physician: Margot Ables [id number]  Diagnosis: Diabetic foot ulcer [id number]  Estimated Length of Stay: < 2 midnights  Tentative Discharge Plan?: Home or Self Care [1]  Patient Class: Observation [104]            Prescriptions  New Prescriptions    No medications on file       Scribe Attestation statement    I, Geryl Councilman, personally performed the services documented. Shona Simpson is scribing for me on Simson,Reiner E. I reviewed and confirm the accuracy of the  information in this medical record.           Lacinda Axon, MD                    Geryl Councilman, MD  03/10/15 2102

## 2015-03-10 NOTE — ED Notes (Signed)
Report given to Acquanetta Sit., RN, at this time

## 2015-03-10 NOTE — Plan of Care (Signed)
Problem: Infection  Goal: Signs and symptoms of infections are decreased or avoided  Outcome: Progressing

## 2015-03-10 NOTE — ED Notes (Signed)
See triage note. [facility] Saturday for diabetic ulcer on left foot. Pt started on abx at that time. Pt c/o pain, infection, and clear drainage coming from his ulcer on Friday. Pt states he had a PICC line for abx in Nov/Dec 2016 for same ulcer.

## 2015-03-11 LAB — BASIC METABOLIC PANEL
Anion Gap: 12.8 mMol/L (ref 7.0–18.0)
BUN / Creatinine Ratio: 26.8 Ratio (ref 10.0–30.0)
BUN: 34 mg/dL — ABNORMAL HIGH (ref 7–22)
CO2: 26.4 mMol/L (ref 20.0–30.0)
Calcium: 8.3 mg/dL — ABNORMAL LOW (ref 8.5–10.5)
Chloride: 100 mMol/L (ref 98–110)
Creatinine: 1.27 mg/dL (ref 0.80–1.30)
EGFR: 60 mL/min/{1.73_m2}
Glucose: 283 mg/dL — ABNORMAL HIGH (ref 70–99)
Osmolality Calc: 288 mOsm/kg (ref 275–300)
Potassium: 4.2 mMol/L (ref 3.5–5.3)
Sodium: 135 mMol/L — ABNORMAL LOW (ref 136–147)

## 2015-03-11 LAB — COMPREHENSIVE METABOLIC PANEL
ALT: 15 U/L (ref 0–55)
AST (SGOT): 14 U/L (ref 10–42)
Albumin/Globulin Ratio: 0.53 Ratio — ABNORMAL LOW (ref 0.70–1.50)
Albumin: 2.1 gm/dL — ABNORMAL LOW (ref 3.5–5.0)
Alkaline Phosphatase: 88 U/L (ref 40–145)
Anion Gap: 12.9 mMol/L (ref 7.0–18.0)
BUN / Creatinine Ratio: 24.6 Ratio (ref 10.0–30.0)
BUN: 30 mg/dL — ABNORMAL HIGH (ref 7–22)
Bilirubin, Total: 0.4 mg/dL (ref 0.1–1.2)
CO2: 26.1 mMol/L (ref 20.0–30.0)
Calcium: 8.3 mg/dL — ABNORMAL LOW (ref 8.5–10.5)
Chloride: 101 mMol/L (ref 98–110)
Creatinine: 1.22 mg/dL (ref 0.80–1.30)
EGFR: >60 mL/min/{1.73_m2}
Globulin: 4 gm/dL (ref 2.0–4.0)
Glucose: 246 mg/dL — ABNORMAL HIGH (ref 70–99)
Osmolality Calc: 286 mOsm/kg (ref 275–300)
Potassium: 4 mMol/L (ref 3.5–5.3)
Protein, Total: 6.1 gm/dL (ref 6.0–8.3)
Sodium: 136 mMol/L (ref 136–147)

## 2015-03-11 LAB — VH DEXTROSE STICK GLUCOSE
Glucose POCT: 201 mg/dL — ABNORMAL HIGH (ref 70–99)
Glucose POCT: 259 mg/dL — ABNORMAL HIGH (ref 70–99)
Glucose POCT: 201 mg/dL — ABNORMAL HIGH (ref 70–99)
Glucose POCT: 218 mg/dL — ABNORMAL HIGH (ref 70–99)

## 2015-03-11 LAB — CBC AND DIFFERENTIAL
Basophils %: 0.6 % (ref 0.0–3.0)
Basophils Absolute: 0.1 10*3/uL (ref 0.0–0.3)
Eosinophils %: 1.8 % (ref 0.0–7.0)
Eosinophils Absolute: 0.2 10*3/uL (ref 0.0–0.8)
Hematocrit: 34.3 % — ABNORMAL LOW (ref 39.0–52.5)
Hemoglobin: 11.6 gm/dL — ABNORMAL LOW (ref 13.0–17.5)
Lymphocytes Absolute: 1.8 10*3/uL (ref 0.6–5.1)
Lymphocytes: 18.7 % (ref 15.0–46.0)
MCH: 31 pg (ref 28–35)
MCHC: 34 gm/dL (ref 32–36)
MCV: 93 fL (ref 80–100)
MPV: 8.9 fL (ref 6.0–10.0)
Monocytes Absolute: 0.7 10*3/uL (ref 0.1–1.7)
Monocytes: 7.4 % (ref 3.0–15.0)
Neutrophils %: 71.5 % (ref 42.0–78.0)
Neutrophils Absolute: 7 10*3/uL (ref 1.7–8.6)
PLT CT: 220 10*3/uL (ref 130–440)
RBC: 3.69 10*6/uL — ABNORMAL LOW (ref 4.00–5.70)
RDW: 12.2 % (ref 11.0–14.0)
WBC: 9.8 10*3/uL (ref 4.0–11.0)

## 2015-03-11 MED ORDER — INSULIN ASPART 100 UNIT/ML SC SOPN
2.0000 [IU] | PEN_INJECTOR | Freq: Four times a day (QID) | SUBCUTANEOUS | Status: DC | PRN
Start: 2015-03-11 — End: 2015-03-17
  Administered 2015-03-11: 7 [IU] via SUBCUTANEOUS
  Administered 2015-03-11 – 2015-03-13 (×2): 4 [IU] via SUBCUTANEOUS
  Administered 2015-03-16: 2 [IU] via SUBCUTANEOUS
  Filled 2015-03-11: qty 3

## 2015-03-11 MED ORDER — GABAPENTIN 300 MG PO CAPS
300.0000 mg | ORAL_CAPSULE | Freq: Three times a day (TID) | ORAL | Status: DC
Start: 2015-03-11 — End: 2015-03-11

## 2015-03-11 MED ORDER — LISINOPRIL 20 MG PO TABS
20.0000 mg | ORAL_TABLET | Freq: Every evening | ORAL | Status: DC
Start: 2015-03-11 — End: 2015-03-17
  Administered 2015-03-11 – 2015-03-16 (×6): 20 mg via ORAL
  Filled 2015-03-11 (×3): qty 1
  Filled 2015-03-11: qty 2
  Filled 2015-03-11 (×3): qty 1

## 2015-03-11 MED ORDER — INSULIN GLARGINE 100 UNIT/ML SC SOPN
20.0000 [IU] | PEN_INJECTOR | Freq: Every evening | SUBCUTANEOUS | Status: DC
Start: 2015-03-11 — End: 2015-03-11

## 2015-03-11 MED ORDER — ATORVASTATIN CALCIUM 10 MG PO TABS
10.0000 mg | ORAL_TABLET | Freq: Every evening | ORAL | Status: DC
Start: 2015-03-11 — End: 2015-03-17
  Administered 2015-03-11 – 2015-03-16 (×6): 10 mg via ORAL
  Filled 2015-03-11 (×9): qty 1

## 2015-03-11 MED ORDER — DEXTROSE 50 % IV SOLN
25.0000 mL | INTRAVENOUS | Status: DC | PRN
Start: 2015-03-11 — End: 2015-03-17

## 2015-03-11 MED ORDER — INSULIN GLARGINE 100 UNIT/ML SC SOPN
25.0000 [IU] | PEN_INJECTOR | Freq: Every morning | SUBCUTANEOUS | Status: DC
Start: 2015-03-11 — End: 2015-03-13
  Administered 2015-03-11 – 2015-03-13 (×2): 25 [IU] via SUBCUTANEOUS
  Filled 2015-03-11 (×2): qty 3

## 2015-03-11 MED ORDER — VH VANCOMYCIN 1250 MG IN NS 250 ML (SIMPLE)
1250.0000 mg | Freq: Two times a day (BID) | Status: DC
Start: 2015-03-11 — End: 2015-03-13
  Administered 2015-03-11 – 2015-03-13 (×5): 1250 mg via INTRAVENOUS
  Filled 2015-03-11 (×5): qty 250

## 2015-03-11 MED ORDER — METFORMIN HCL 500 MG PO TABS
1000.0000 mg | ORAL_TABLET | Freq: Two times a day (BID) | ORAL | Status: DC
Start: 2015-03-11 — End: 2015-03-12
  Administered 2015-03-11 (×2): 1000 mg via ORAL
  Filled 2015-03-11 (×4): qty 2

## 2015-03-11 MED ORDER — VH VANCOMYCIN THERAPY PLACEHOLDER
Status: DC
Start: 2015-03-11 — End: 2015-03-14

## 2015-03-11 MED ORDER — CYCLOBENZAPRINE HCL 10 MG PO TABS
10.0000 mg | ORAL_TABLET | Freq: Three times a day (TID) | ORAL | Status: DC | PRN
Start: 2015-03-11 — End: 2015-03-17

## 2015-03-11 MED ORDER — VANCOMYCIN HCL IN DEXTROSE 1-5 GM/200ML-% IV SOLN
1000.0000 mg | Freq: Two times a day (BID) | INTRAVENOUS | Status: DC
Start: 2015-03-11 — End: 2015-03-11
  Administered 2015-03-11: 1000 mg via INTRAVENOUS
  Filled 2015-03-11: qty 1000

## 2015-03-11 MED ORDER — GABAPENTIN 300 MG PO CAPS
300.0000 mg | ORAL_CAPSULE | Freq: Three times a day (TID) | ORAL | Status: DC
Start: 2015-03-11 — End: 2015-03-17
  Administered 2015-03-11 – 2015-03-17 (×20): 300 mg via ORAL
  Filled 2015-03-11 (×23): qty 1

## 2015-03-11 MED ORDER — PIPERACILLIN SOD-TAZOBACTAM SO 3.375 (3-0.375) G IV SOLR
3.3750 g | Freq: Four times a day (QID) | INTRAVENOUS | Status: DC
Start: 2015-03-11 — End: 2015-03-14
  Administered 2015-03-11 – 2015-03-14 (×14): 3.375 g via INTRAVENOUS
  Filled 2015-03-11: qty 3.38
  Filled 2015-03-11 (×2): qty 100
  Filled 2015-03-11 (×4): qty 3.38
  Filled 2015-03-11 (×3): qty 100
  Filled 2015-03-11 (×2): qty 3.38
  Filled 2015-03-11 (×3): qty 100
  Filled 2015-03-11 (×2): qty 3.38
  Filled 2015-03-11: qty 100
  Filled 2015-03-11: qty 3.38
  Filled 2015-03-11: qty 100
  Filled 2015-03-11 (×2): qty 3.38
  Filled 2015-03-11: qty 100
  Filled 2015-03-11: qty 3.38
  Filled 2015-03-11: qty 100
  Filled 2015-03-11: qty 3.38
  Filled 2015-03-11 (×2): qty 100

## 2015-03-11 MED ORDER — INSULIN GLARGINE 100 UNIT/ML SC SOPN
30.0000 [IU] | PEN_INJECTOR | Freq: Every evening | SUBCUTANEOUS | Status: DC
Start: 2015-03-11 — End: 2015-03-11

## 2015-03-11 MED ORDER — INSULIN ASPART 100 UNIT/ML SC SOPN
7.0000 [IU] | PEN_INJECTOR | Freq: Three times a day (TID) | SUBCUTANEOUS | Status: DC
Start: 2015-03-11 — End: 2015-03-13
  Administered 2015-03-11 – 2015-03-13 (×5): 7 [IU] via SUBCUTANEOUS
  Filled 2015-03-11: qty 3

## 2015-03-11 MED ORDER — INSULIN ASPART 100 UNIT/ML SC SOPN
1.0000 [IU] | PEN_INJECTOR | Freq: Three times a day (TID) | SUBCUTANEOUS | Status: DC | PRN
Start: 2015-03-11 — End: 2015-03-11

## 2015-03-11 MED ORDER — VH BIO-K PLUS PROBIOTIC 50 BIL CFU CAPSULE
50.0000 | DELAYED_RELEASE_CAPSULE | Freq: Every day | ORAL | Status: DC
Start: 2015-03-11 — End: 2015-03-17
  Administered 2015-03-11 – 2015-03-17 (×7): 50 via ORAL
  Filled 2015-03-11 (×7): qty 1

## 2015-03-11 MED ORDER — ASPIRIN 81 MG PO CHEW
81.0000 mg | CHEWABLE_TABLET | Freq: Every day | ORAL | Status: DC
Start: 2015-03-11 — End: 2015-03-17
  Administered 2015-03-11 – 2015-03-17 (×7): 81 mg via ORAL
  Filled 2015-03-11 (×8): qty 1

## 2015-03-11 NOTE — Plan of Care (Signed)
Problem: Tissue integrity  Goal: Damaged tissue is healing and protected  Outcome: Progressing

## 2015-03-11 NOTE — Progress Notes (Signed)
OBSERVATION UNIT  PROGRESS NOTE    Patient Name: [name], [name]  Time: 03/11/2015 3:25 PM    Subjective:     Pt states he rested well overnight; he feels his foot is NOT getting better.   No fever or chills.   Objective:     Filed Vitals:    03/11/15 1218   BP: 140/74   Pulse:    Temp: 98.5 F (36.9 C)   Resp: 18   SpO2: 98%     Review of Systems   Constitutional: Positive for malaise/fatigue. Negative for fever and chills.   HENT: Negative.    Eyes: Negative.    Cardiovascular: Negative.    Gastrointestinal: Negative.    Genitourinary: Negative.    Musculoskeletal: Negative.    Skin:        Left foot wound draining with redness   Neurological: Negative.    Psychiatric/Behavioral: Negative.        Labs:  Recent Results (from the past 24 hour(s))   CBC    Collection Time: 03/10/15  7:19 PM   Result Value Ref Range    WBC 12.0 (H) 4.0 - 11.0 K/cmm    RBC 4.26 4.00 - 5.70 M/cmm    Hemoglobin 13.3 13.0 - 17.5 gm/dL    Hematocrit 40.0 39.0 - 52.5 %    MCV 94 80 - 100 fL    MCH 31 28 - 35 pg    MCHC 33 32 - 36 gm/dL    RDW 12.2 11.0 - 14.0 %    PLT CT 222 130 - 440 K/cmm    MPV 9.2 6.0 - 10.0 fL    NEUTROPHIL % 79.8 (H) 42.0 - 78.0 %    Lymphocytes 12.3 (L) 15.0 - 46.0 %    Monocytes 6.1 3.0 - 15.0 %    Eosinophils % 1.5 0.0 - 7.0 %    Basophils % 0.3 0.0 - 3.0 %    Neutrophils Absolute 9.5 (H) 1.7 - 8.6 K/cmm    Lymphocytes Absolute 1.5 0.6 - 5.1 K/cmm    Monocytes Absolute 0.7 0.1 - 1.7 K/cmm    Eosinophils Absolute 0.2 0.0 - 0.8 K/cmm    BASO Absolute 0.0 0.0 - 0.3 K/cmm   Basic Metabolic Panel    Collection Time: 03/10/15  7:19 PM   Result Value Ref Range    Sodium 136 136 - 147 mMol/L    Potassium 4.7 3.5 - 5.3 mMol/L    Chloride 99 98 - 110 mMol/L    CO2 26.3 20.0 - 30.0 mMol/L    Calcium 9.4 8.5 - 10.5 mg/dL    Glucose 435 (H) 70 - 99 mg/dL    Creatinine 1.50 (H) 0.80 - 1.30 mg/dL    BUN 31 (H) 7 - 22 mg/dL    Anion Gap 15.4 7.0 - 18.0 mMol/L    BUN/Creatinine Ratio 20.7 10.0 - 30.0 Ratio    EGFR 49  mL/min/1.108m2    Osmolality Calc 297 275 - 300 mOsm/kg   ESR    Collection Time: 03/10/15  7:19 PM   Result Value Ref Range    Sed Rate 74 (H) 0 - 20 mm/hr   CRP    Collection Time: 03/10/15  7:19 PM   Result Value Ref Range    C-Reactive Protein 17.07 (H) 0.02 - 0.80 mg/dL   Dextrose Stick Glucose    Collection Time: 03/10/15 10:11 PM   Result Value Ref Range    Glucose, POCT 336 (H) 70 -  99 mg/dL   Basic Metabolic Panel    Collection Time: 03/11/15 12:37 AM   Result Value Ref Range    Sodium 135 (L) 136 - 147 mMol/L    Potassium 4.2 3.5 - 5.3 mMol/L    Chloride 100 98 - 110 mMol/L    CO2 26.4 20.0 - 30.0 mMol/L    Calcium 8.3 (L) 8.5 - 10.5 mg/dL    Glucose 283 (H) 70 - 99 mg/dL    Creatinine 1.27 0.80 - 1.30 mg/dL    BUN 34 (H) 7 - 22 mg/dL    Anion Gap 12.8 7.0 - 18.0 mMol/L    BUN/Creatinine Ratio 26.8 10.0 - 30.0 Ratio    EGFR 60 mL/min/1.81m2    Osmolality Calc 288 275 - 300 mOsm/kg   CBC with differential    Collection Time: 03/11/15  5:11 AM   Result Value Ref Range    WBC 9.8 4.0 - 11.0 K/cmm    RBC 3.69 (L) 4.00 - 5.70 M/cmm    Hemoglobin 11.6 (L) 13.0 - 17.5 gm/dL    Hematocrit 34.3 (L) 39.0 - 52.5 %    MCV 93 80 - 100 fL    MCH 31 28 - 35 pg    MCHC 34 32 - 36 gm/dL    RDW 12.2 11.0 - 14.0 %    PLT CT 220 130 - 440 K/cmm    MPV 8.9 6.0 - 10.0 fL    NEUTROPHIL % 71.5 42.0 - 78.0 %    Lymphocytes 18.7 15.0 - 46.0 %    Monocytes 7.4 3.0 - 15.0 %    Eosinophils % 1.8 0.0 - 7.0 %    Basophils % 0.6 0.0 - 3.0 %    Neutrophils Absolute 7.0 1.7 - 8.6 K/cmm    Lymphocytes Absolute 1.8 0.6 - 5.1 K/cmm    Monocytes Absolute 0.7 0.1 - 1.7 K/cmm    Eosinophils Absolute 0.2 0.0 - 0.8 K/cmm    BASO Absolute 0.1 0.0 - 0.3 K/cmm   Comprehensive metabolic panel    Collection Time: 03/11/15  5:11 AM   Result Value Ref Range    Sodium 136 136 - 147 mMol/L    Potassium 4.0 3.5 - 5.3 mMol/L    Chloride 101 98 - 110 mMol/L    CO2 26.1 20.0 - 30.0 mMol/L    Calcium 8.3 (L) 8.5 - 10.5 mg/dL    Glucose 246 (H) 70 - 99 mg/dL     Creatinine 1.22 0.80 - 1.30 mg/dL    BUN 30 (H) 7 - 22 mg/dL    Protein, Total 6.1 6.0 - 8.3 gm/dL    Albumin 2.1 (L) 3.5 - 5.0 gm/dL    Alkaline Phosphatase 88 40 - 145 U/L    ALT 15 0 - 55 U/L    AST (SGOT) 14 10 - 42 U/L    Bilirubin, Total 0.4 0.1 - 1.2 mg/dL    Albumin/Globulin Ratio 0.53 (L) 0.70 - 1.50 Ratio    Anion Gap 12.9 7.0 - 18.0 mMol/L    BUN/Creatinine Ratio 24.6 10.0 - 30.0 Ratio    EGFR >60 mL/min/1.42m2    Osmolality Calc 286 275 - 300 mOsm/kg    Globulin 4.0 2.0 - 4.0 gm/dL   Dextrose Stick Glucose    Collection Time: 03/11/15  8:16 AM   Result Value Ref Range    Glucose, POCT 201 (H) 70 - 99 mg/dL   Dextrose Stick Glucose    Collection Time: 03/11/15 12:17 PM  Result Value Ref Range    Glucose, POCT 259 (H) 70 - 99 mg/dL       Imaging:  Xr Foot Left Ap Lateral And Oblique    03/10/2015  Recurrent soft tissue thickening and ulceration at the medial aspect of the left first MTP joint, with progressive erosion of the metatarsal head favoring osteomyelitis in this context over gout. ReadingStation:[email or web address]    Mri Foot Left W Wo Contrast    03/10/2015  Soft tissue ulcer medial aspect left first MTP joint, accompanied by peripherally enhancing fluid collection along the abductor hallucis, synovitis, joint effusion, and osteomyelitis with progressive erosion of the metatarsal head.  ReadingStation:[email or web address]      Medications:  Current Facility-Administered Medications   Medication Dose Route Frequency   . aspirin  81 mg Oral Daily   . atorvastatin  10 mg Oral QHS   . enoxaparin  40 mg Subcutaneous Q24H   . gabapentin  300 mg Oral TID   . insulin aspart  7 Units Subcutaneous TID AC   . insulin glargine  25 Units Subcutaneous QAM   . lactobacillus species  50 Billion CFU Oral Daily   . lisinopril  20 mg Oral QHS   . metFORMIN  1,000 mg Oral BID Meals   . piperacillin-tazobactam  3.375 g Intravenous Q6H   . vancomycin  1,250 mg Intravenous Q12H   . vancomycin therapy placeholder   Does not apply See Admin  Instructions       Past Medical History   Diagnosis Date   . Seasonal allergic rhinitis    . Diabetes mellitus    . Disc    . Hypertension    . Diabetic ulcer of left foot 12/26/14       Assessment and Plan:     1. Continue IV Antibiotics: Vanco, Zosyn  2. Analgesics  3.  Cultures pending: moderate growth, probable Strep, identification pending  4. DVT Prophylaxis   5. Monitor and control BS- continue patient's Lantus and Novolog regimen, adjust as needed.   6. Repeat Labs: CBC / Chem8 in AM  7. Podiatry / surgical consultation (followed by Dr. Evette Doffing).     Rica Mast, NP

## 2015-03-11 NOTE — H&P (Signed)
[place]  OBSERVATION UNIT  HISTORY AND PHYSICAL       Patient: [name]  Admission Date: 03/10/2015    DOB: 02-03-1966  Age: 50 y.o.    MRN: [id number]  Sex: male    PCP: Rhunette Croft, MD  Attending: Margot Ables, MD       ASSESSMENT & PLAN     [name] is a 50 y.o. male admitted under OBSERVATION with left foot diabetic foot ulcer with pain, swelling and purulent drainage.     Assessment & Plan:  1.  MRI foot   2.  IV Antibiotics: Vanco, Zosyn  3.  Analgesics  4.  Repeat Labs:  CBC / Chem8 in AM  5.  Cultures pending  6.  Monitor patient for clinical response to treatment   7.  DVT Prophylaxis   8.  Monitor and control BS- continue patient's Lantus and Novolog regimen, adjust as needed.   9.  Podiatry / surgical consultation in AM.         HISTORY OF PRESENT ILLNESS     [name] is a 50 y.o. male hx of DM II, diabetic neuropathy and left foot osteomyelitis who presented with left foot wound, erythema, pain and drainage.  He noticed the area was red and swollen a few days ago.  Yesterday redness had spread and he thought the wound was draining.  He noticed fever today and decided to come to ED for evaluation.        Pt was admitted to [facility] 12/2014 with osteomyelitis of this foot.  He spent 9 days in the hospital and was discharge with a PICC line.  Wound grew MSSA at that time. Pt states he had some issues with swelling and pain in this area since completion of IV antibiotics and has taken a few courses of PO antibiotics.  He is followed closely by Dr Evette Doffing- his podiatrist.     Workup in the ED CBC with is grossly normal; BUN / sCr:  31/1.5.  CRP is 17, Sed rate 74.  Blood glucose 435.  He is afebrile.  X ray of left foot reveals:  Changes consistent with osteomyelitis of the first MTP joint.   The patient has agreed to transfer to the Observation Unit for MRI, IV antibiotic and consultation as needed.  Wound culture obtained in ED and is pending.    PAST  MEDICAL HISTORY     Code Status: Full Code    Past Medical History   Diagnosis Date   . Seasonal allergic rhinitis    . Diabetes mellitus    . Disc    . Hypertension    . Diabetic ulcer of left foot 12/26/14       Past Surgical History   Procedure Laterality Date   . Hand surgery         Current Discharge Medication List      CONTINUE these medications which have NOT CHANGED    Details   aspirin (ASPIRIN CHILDRENS) 81 MG chewable tablet Chew 1 tablet (81 mg total) by mouth daily.  Qty: 90 tablet, Refills: 0    Associated Diagnoses: Type 2 diabetes mellitus with diabetic neuropathy      atorvastatin (LIPITOR) 10 MG tablet Take 10 mg by mouth nightly.      ciprofloxacin (CIPRO) 500 MG tablet Take 500 mg by mouth 2 (two) times daily. Started 10 day therapy 03/08/15      collagenase (SANTYL) ointment  Apply topically daily.  Qty: 30 g, Refills: 0    Associated Diagnoses: Diabetic ulcer of left foot associated with type 2 diabetes mellitus      gabapentin (NEURONTIN) 300 MG capsule Take 1 capsule (300 mg total) by mouth 3 (three) times daily.  Qty: 270 capsule, Refills: 3    Associated Diagnoses: Type 2 diabetes mellitus with diabetic neuropathy, with long-term current use of insulin      insulin aspart (NOVOLOG FLEXPEN) 100 UNIT/ML injection pen Inject 7 Units into the skin 3 (three) times daily before meals.  Qty: 6 pen, Refills: 3    Associated Diagnoses: Type 2 diabetes mellitus with diabetic polyneuropathy, with long-term current use of insulin      Insulin Detemir (LEVEMIR FLEXPEN SC) Inject 20 Units into the skin every morning. Switch to 20 units of Lantus every morning once Levemir supply is depleted        lisinopril (PRINIVIL,ZESTRIL) 20 MG tablet Take 20 mg by mouth nightly.      metFORMIN (GLUCOPHAGE) 1000 MG tablet Take 1 tablet (1,000 mg total) by mouth 2 (two) times daily with meals.  Qty: 180 tablet, Refills: 3    Associated Diagnoses: Type 2 diabetes mellitus with diabetic neuropathy      oxymetazoline  (AFRIN) 0.05 % nasal spray 2 sprays by Nasal route 2 (two) times daily as needed for Congestion.      cyclobenzaprine (FLEXERIL) 10 MG tablet Take 1 tablet (10 mg total) by mouth every 8 (eight) hours as needed for Muscle spasms (pain or muscle spasm).  Qty: 20 tablet, Refills: 0             Allergies:   Allergies   Allergen Reactions   . Flu Virus Vaccine Anaphylaxis       Family History   Problem Relation Age of Onset   . Diabetes Mother    . Depression Sister    . COPD Sister    . Hypertension Brother        SHx:  reports that he has never smoked. He has never used smokeless tobacco. He reports that he does not drink alcohol or use illicit drugs.    REVIEW OF SYSTEMS     A complete 12 point review of systems was completed and was negative except as noted in the HPI.     PHYSICAL EXAM     Vital Signs: Blood pressure 128/81, pulse 103, temperature 99.8 F (37.7 C), temperature source Oral, resp. rate 18, weight 101.9 kg (224 lb 10.4 oz), SpO2 99 %.    Physical Exam   Constitutional: He is oriented to person, place, and time. He appears well-developed and well-nourished. No distress.   HENT:   Head: Normocephalic and atraumatic.   Eyes: Conjunctivae are normal. Pupils are equal, round, and reactive to light.   Neck: Normal range of motion. Neck supple.   Cardiovascular: Normal rate, regular rhythm, normal heart sounds and intact distal pulses.    Pulmonary/Chest: Effort normal and breath sounds normal.   Abdominal: Soft. Bowel sounds are normal. There is no tenderness.   Musculoskeletal: Normal range of motion. He exhibits edema and tenderness.        Left foot: There is tenderness, bony tenderness and deformity.        Feet:    There is an approximate 2 cm white lesion on the left great toe at the hallux valgus with surrounding erythema and warmth. Wound is now open and freely draining pus.  Neurological: He is alert and oriented to person, place, and time.   Skin: Skin is warm and dry. There is erythema.      Psychiatric: He has a normal mood and affect. His behavior is normal.       LABS & IMAGING     Labs:  Results for orders placed or performed during the hospital encounter of 03/10/15   Culture Wound    Narrative    Specimen/Source: Wound/Left Foot  Collected: 03/10/2015 20:57     Status: Valued      Last Updated: 03/11/2015 01:53           (1) *Specimen Wound      *Source Detail Left Foot             Gram Stain (Final)      Few Epithelial Cells      Many WBC's Seen      Moderate Gram Positive Cocci         CBC   Result Value Ref Range    WBC 12.0 (H) 4.0 - 11.0 K/cmm    RBC 4.26 4.00 - 5.70 M/cmm    Hemoglobin 13.3 13.0 - 17.5 gm/dL    Hematocrit 40.0 39.0 - 52.5 %    MCV 94 80 - 100 fL    MCH 31 28 - 35 pg    MCHC 33 32 - 36 gm/dL    RDW 12.2 11.0 - 14.0 %    PLT CT 222 130 - 440 K/cmm    MPV 9.2 6.0 - 10.0 fL    NEUTROPHIL % 79.8 (H) 42.0 - 78.0 %    Lymphocytes 12.3 (L) 15.0 - 46.0 %    Monocytes 6.1 3.0 - 15.0 %    Eosinophils % 1.5 0.0 - 7.0 %    Basophils % 0.3 0.0 - 3.0 %    Neutrophils Absolute 9.5 (H) 1.7 - 8.6 K/cmm    Lymphocytes Absolute 1.5 0.6 - 5.1 K/cmm    Monocytes Absolute 0.7 0.1 - 1.7 K/cmm    Eosinophils Absolute 0.2 0.0 - 0.8 K/cmm    BASO Absolute 0.0 0.0 - 0.3 K/cmm   Basic Metabolic Panel   Result Value Ref Range    Sodium 136 136 - 147 mMol/L    Potassium 4.7 3.5 - 5.3 mMol/L    Chloride 99 98 - 110 mMol/L    CO2 26.3 20.0 - 30.0 mMol/L    Calcium 9.4 8.5 - 10.5 mg/dL    Glucose 435 (H) 70 - 99 mg/dL    Creatinine 1.50 (H) 0.80 - 1.30 mg/dL    BUN 31 (H) 7 - 22 mg/dL    Anion Gap 15.4 7.0 - 18.0 mMol/L    BUN/Creatinine Ratio 20.7 10.0 - 30.0 Ratio    EGFR 49 mL/min/1.35m2    Osmolality Calc 297 275 - 300 mOsm/kg   ESR   Result Value Ref Range    Sed Rate 74 (H) 0 - 20 mm/hr   CRP   Result Value Ref Range    C-Reactive Protein 17.07 (H) 0.02 - 0.80 mg/dL   Dextrose Stick Glucose   Result Value Ref Range    Glucose, POCT 336 (H) 70 - 99 mg/dL   Basic Metabolic Panel   Result Value Ref  Range    Sodium 135 (L) 136 - 147 mMol/L    Potassium 4.2 3.5 - 5.3 mMol/L    Chloride 100 98 - 110 mMol/L    CO2 26.4 20.0 -  30.0 mMol/L    Calcium 8.3 (L) 8.5 - 10.5 mg/dL    Glucose 283 (H) 70 - 99 mg/dL    Creatinine 1.27 0.80 - 1.30 mg/dL    BUN 34 (H) 7 - 22 mg/dL    Anion Gap 12.8 7.0 - 18.0 mMol/L    BUN/Creatinine Ratio 26.8 10.0 - 30.0 Ratio    EGFR 60 mL/min/1.47m2    Osmolality Calc 288 275 - 300 mOsm/kg    Narrative    This order is a replacement of the rejected order with accession number [id number].       Imaging:  MRI FOOT LEFT W WO CONTRAST   Final Result   Soft tissue ulcer medial aspect left first MTP joint, accompanied by peripherally enhancing fluid collection along the   abductor hallucis, synovitis, joint effusion, and osteomyelitis with progressive erosion of the metatarsal head.          ReadingStation:[id number]      XR FOOT LEFT AP LATERAL AND OBLIQUE   Final Result   Recurrent soft tissue thickening and ulceration at the medial aspect of the left first MTP joint, with progressive   erosion of the metatarsal head favoring osteomyelitis in this context over gout.      [place] COURSE     ED Medication Orders     Start Ordered     Status Ordering Provider    03/10/15 2010 03/10/15 2009  ketorolac (TORADOL) injection 15 mg   Once in ED     Route: Intravenous  Ordered Dose: 15 mg     Last MAR action:  Given Lacinda Axon MARIE    03/10/15 2007 03/10/15 2006     Every 6 hours,   Status:  Discontinued     Route: Intravenous  Ordered Dose: 3 g     Discontinued Lacinda Axon MARIE    03/10/15 2006 03/10/15 2005  sodium chloride 0.9 % bolus 1,000 mL   Once in ED     Route: Intravenous  Ordered Dose: 1,000 mL     Last MAR action:  [place], Johny Drilling          ED documentation including nurses notes were reviewed by myself.  The case was discussed with the ED Attending.  I certify the need for admission to the Observation Unit  based on the patient's history and the information above.    Fransisco Beau, [place]   03/11/2015 4:51 AM

## 2015-03-11 NOTE — UM Notes (Signed)
[facility] Utilization Management Review Sheet    NAME: [name]  MR#: [id number]    CSN#: [id number]    ROOM: 2518/2518-A AGE: 50 y.o.    ADMIT DATE AND TIME: 03/10/2015  6:52 PM    PATIENT CLASS: OBSERVATION  124/17 @  2058 ADMIT OBSERVATION- OBS UNIT    ATTENDING PHYSICIAN: Margot Ables, MD  PAYOR:Payor: OUT OF STATE BLUE CROSS / Plan: [place] / Product Type: *No Product type* /     AUTH #: NPR    DIAGNOSIS:     ICD-10-CM    1. Diabetic ulcer of left foot associated with type 2 diabetes mellitus E11.621     L97.529    2. Cellulitis of left foot L03.116    3. Osteomyelitis of left foot, unspecified chronicity M86.9      HISTORY:   Past Medical History   Diagnosis Date   . Seasonal allergic rhinitis    . Diabetes mellitus    . Disc    . Hypertension    . Diabetic ulcer of left foot 12/26/14     DATE OF ED TREATMENT - 03/10/15  LABS: IVF, UNASYN IV TORADOL IV    OBSERVATION REVIEW    HPI: Presents with left foot wound associated with erythema, pain, and drainage. Per H&P " Pt was admitted to [facility] 12/2014 with osteomyelitis of this foot. He spent 9 days in the hospital and was discharge with a PICC line. Wound grew MSSA at that time. Pt states he had some issues with swelling and pain in this area since completion of IV antibiotics and has taken a few courses of PO antibiotics. He is followed closely by Dr Evette Doffing."    ABNORMAL LABS: WBC 12.0   GLUC 435 CR 1.50 BUN 31 SED RATE 74  CRP 17.07    DIAGNOSTIC TESTING: XR LEFT FOOT: Recurrent soft tissue thickening and ulceration at the medial aspect of the left first MTP joint, with progressive erosion of the metatarsal head favoring osteomyelitis in this context over gout.  MRI LEFT FOOT W WO CONTRAST( DONE IN OBS) Soft tissue ulcer medial aspect left first MTP joint, accompanied by peripherally enhancing fluid collection along the  abductor hallucis, synovitis, joint effusion, and osteomyelitis with progressive erosion of the metatarsal  head.    [id number] pressure 142/88, pulse 112, temperature 99.3 F (37.4 C), temperature source Oral, resp. rate 16, weight 101.9 kg, SpO2 97 %.      ABNORMAL FINDINGS:Diabetic ulcer to left foot at base of great toe with surrounding cellulitis and tenderness with clear drainage. Diffuse edema of left foot.    ASSESSMENT AND PLAN:  DIABETIC FOOT ULCER W PAIN, SWELLING, AND PURULENT DRAINAGE  PLAN: IVF ABX, CX'S PENDING, REPEAT LABS    OBSERVATION ORDERS   VS Q 4 HRS, CONTINUOUS PULSE OX, GLUC POCT AC AND HS, VANCOMYCIN 1250 Q 12 HRS, ZOSYN 3.375 IV Q 6 HRS, CONTINUE HOME MEDS, LOVENOX SQ QD, PODIATRY CONSULT IN AM      DAY 2 OBSERVATION FOLLOW-UP  03/11/15  LABS: GLUC 246 H&H 11.6/34.3  VS: BP 140/74 mmHg  Pulse 103  Temp(Src) 98.5 F (36.9 C) (Oral)  Resp 18  Ht 1.676 m (5\' 6" )  Wt 101.9 kg (224 lb 10.4 oz)  BMI 36.28 kg/m2  SpO2 98%  CURRENT ORDERS AS ABOVE  PODIATRY CONSULT PENDING    DATE OF REVIEW: 03/11/2015    Tressie Stalker, RN  Utilization Management  Case Management Department  [facility]  [address]  [place], [place] [address]  T [phone]    F [phone]   [email or web address]

## 2015-03-11 NOTE — Plan of Care (Signed)
Problem: Infection  Goal: Adequate hydration  Outcome: Progressing  IVF bolus given.        Goal: Signs and symptoms of infections are decreased or avoided  Outcome: Progressing  Ulcer on left foot reddened and swollen, oozing yellow/green drainage.  Goal: Pain at adequate level as identified by patient  Outcome: Progressing  Med for pain as needed.  Goal: Vital signs and assessments are within defined parameters.  Outcome: Progressing  VS stable.

## 2015-03-11 NOTE — Progress Notes (Addendum)
Pharmacy Vancomycin Dosing Consult Note  [name]    Assessment:   1. Day one vanco/zosyn for L diabetic foot/osteomyelitis. Patient's wound culture is growing gram positive cocci,.   2. Crclr 80 ml/min     Plan:   1. vanco 1250 mg IV q12h, trough goal 15-20 due 1/26 1400  2. Zosyn 3.375g IV q6h  3. Pharmacy will follow the patient's renal function, vancomycin levels and dosing     Indication: L foot erythema and edema in region of great toe x 4 days, treated w/ cipro x 3                   days as outpatient, rec'd unasyn 3g in ED  Hx:  Chronic L foot ulcer for past several months, DM, HTN, ARF, hyperlipidemia    Age: 50 y.o.  Height:   1.676 m (5\' 6" )  Weight:  101.9 kg (224 lb 10.4 oz)  IBW: 63.8 kg  DW: 79 kg    Tmax 99.8    SCr 1.27    BUN 34    WBC 12.0    Hgb 13.3    Hct 40.0    Renal meds: enoxaparin, gabapentin, lisinopril, metformin, pip-taz, vancomycin     Pharmacokinetics:   t50: 10.3 hrs         Kel: 0.067 hr-1        Vd: 54.4 L         Expected Trough: 16.8 mg/L

## 2015-03-11 NOTE — Consults (Signed)
PODIATRIC SURGERY CONSULTATION    Date Time: 03/11/2015 2:19 PM  Patient Name: [name]  Requesting Physician: Margot Ables, MD  Consulting Physician: Janora Norlander, DPM     Reason for Consultation:   Left foot wound    History:   [name] is a 50 y.o. male with a PMHx of DM with neuropathy, HTN, who presents to [facility] on 03/10/2015 for a draining left foot wound.  The patient relates that the wound has been present to the medial aspect of the left 1st MTPJ since 12/2014 after injuring his foot in "steel toed boots."      Patient was last admitted to [facility] on 12/2014 secondary to a non-healing diabetic foot ulcer, where he was discharged on a course of IV Ancef for a period of 28 days.  Patient relates the wound improved, that over the past few days, he noticed increased drainage, redness and swelling.  He visited an Urgent Care facility where he was placed on PO antibiotics with no improvement, hence he presented to the ED today.    He endorses fevers, chills but denies any nausea, shortness of breath, chest pains, diarrhea.      Last meal at 1PM today.      Past Medical History:     Past Medical History   Diagnosis Date   . Seasonal allergic rhinitis    . Diabetes mellitus    . Disc    . Hypertension    . Diabetic ulcer of left foot 12/26/14       Past Surgical History:     Past Surgical History   Procedure Laterality Date   . Hand surgery         Family History:     Family History   Problem Relation Age of Onset   . Diabetes Mother    . Depression Sister    . COPD Sister    . Hypertension Brother        Social History:     Social History     Social History   . Marital Status: Single     Spouse Name: N/A   . Number of Children: N/A   . Years of Education: N/A     Social History Main Topics   . Smoking status: Never Smoker    . Smokeless tobacco: Never Used   . Alcohol Use: No   . Drug Use: No   . Sexual Activity: Not on file     Other Topics Concern   . Not on file      Social History Narrative       Allergies:     Allergies   Allergen Reactions   . Flu Virus Vaccine Anaphylaxis       Medications:     Current Facility-Administered Medications   Medication Dose Route Frequency Provider Last Rate Last Dose   . acetaminophen (TYLENOL) tablet 650 mg  650 mg Oral Q4H PRN English, Tiffany R, PA        Or   . acetaminophen (TYLENOL) 160 MG/5ML oral solution 650 mg  650 mg Per NG tube Q4H PRN English, Tiffany R, PA        Or   . acetaminophen (TYLENOL) suppository 650 mg  650 mg Rectal Q4H PRN English, Tiffany R, PA       . aspirin chewable tablet 81 mg  81 mg Oral Daily English, Tiffany R, PA   81 mg at 03/11/15 0919   .  atorvastatin (LIPITOR) tablet 10 mg  10 mg Oral QHS English, Tiffany R, PA       . cyclobenzaprine (FLEXERIL) tablet 10 mg  10 mg Oral Q8H PRN English, Tiffany R, PA       . dextrose 50 % bolus 25 mL  25 mL Intravenous PRN English, Tiffany R, PA       . enoxaparin (LOVENOX) syringe 40 mg  40 mg Subcutaneous Q24H English, Tiffany R, PA   40 mg at 03/10/15 2347   . gabapentin (NEURONTIN) capsule 300 mg  300 mg Oral TID English, Tiffany R, PA   300 mg at 03/11/15 0919   . insulin aspart (NovoLOG) injection pen 2-12 Units  2-12 Units Subcutaneous Q6H PRN Fransisco Beau, PA   7 Units at 03/11/15 0203   . insulin aspart (NovoLOG) injection pen 7 Units  7 Units Subcutaneous TID AC English, Tiffany R, PA   7 Units at 03/11/15 1233   . insulin glargine (LANTUS SOLOSTAR) injection pen 25 Units  25 Units Subcutaneous QAM Rica Mast, NP   25 Units at 03/11/15 0905   . lactobacillus species (BIO-K PLUS) capsule 50 Billion CFU  50 Billion CFU Oral Daily English, Tiffany R, PA   50 Billion CFU at 03/11/15 0919   . lisinopril (PRINIVIL,ZESTRIL) tablet 20 mg  20 mg Oral QHS English, Tiffany R, PA       . metFORMIN (GLUCOPHAGE) tablet 1,000 mg  1,000 mg Oral BID Meals English, Tiffany R, PA   1,000 mg at 03/11/15 0919   . naloxone [facility]) injection 0.4 mg  0.4 mg Intravenous  PRN English, Tiffany R, PA       . ondansetron (ZOFRAN-ODT) disintegrating tablet 4 mg  4 mg Oral Q8H PRN English, Tiffany R, PA        Or   . ondansetron (ZOFRAN) injection 4 mg  4 mg Intravenous Q8H PRN English, Tiffany R, PA       . oxyCODONE-acetaminophen (PERCOCET) 5-325 MG per tablet 1-2 tablet  1-2 tablet Oral Q4H PRN English, Tiffany R, PA       . piperacillin-tazobactam (ZOSYN) 3.375 g in sodium chloride 0.9 % 100 mL IVPB mini-bag plus  3.375 g Intravenous Q6H English, Tiffany R, PA 200 mL/hr at 03/11/15 0918 3.375 g at 03/11/15 0918   . vancomycin (VANCOCIN) 1250 mg in sodium chloride 0.9% 250 mL  1,250 mg Intravenous Q12H Margot Ables, MD       . vancomycin therapy placeholder   Does not apply See Admin Instructions Margot Ables, MD           Review of Systems:   A comprehensive review of systems was: Negative except as stated in HPI    Physical Exam:     Temp:  [98.5 F (36.9 C)-99.8 F (37.7 C)] 98.5 F (36.9 C)  Heart Rate:  [103-113] 103  Resp Rate:  [16-18] 18  BP: (127-142)/(60-88) 140/74 mmHg         No intake or output data in the 24 hours ending 03/11/15 1419 Gen: AAOx3, NAD         Lower Extremity Exam  Vasc:   -DP/PT pulses palpable to b/l lower extremity.   -CFT <5 seconds to digits of b/l foot.     Neuro:   -Protective sensation diminished to pedal sites b/l.  -No motor deficits noted b/l.    Derm:   LLE: Erythematous dorsum of foot, full thickness ulcer to medial aspect  of left 1st MTPJ measuring 1 cm in diameter, probes to bone.  Purulence expressed when manually decompressed, no mal odor noted.    RLE: No ulceration or lesions noted.    MSK:   -Patient can plantarflex and dorsiflex ankles b/l.  -Patient can wiggle toes b/l.       Labs Reviewed:   Labs (last 72 hours):      Recent Labs  Lab 03/11/15  0511 03/10/15  1919   WBC 9.8 12.0*   HEMOGLOBIN 11.6* 13.3   HEMATOCRIT 34.3* 40.0   PLT CT 220 222            Recent Labs  Lab 03/11/15  0511 03/11/15  0037   SODIUM 136 135*    POTASSIUM 4.0 4.2   CHLORIDE 101 100   CO2 26.1 26.4   BUN 30* 34*   CREATININE 1.22 1.27   CALCIUM 8.3* 8.3*   ALBUMIN 2.1*  --    PROTEIN, TOTAL 6.1  --    BILIRUBIN, TOTAL 0.4  --    ALKALINE PHOSPHATASE 88  --    ALT 15  --    AST (SGOT) 14  --    GLUCOSE 246* 283*                 Microbiology:   Microbiology Results     Procedure Component Value Units Date/Time    Culture Wound [id number] Collected:  03/10/15 2057    Specimen Information:  Wound from Left Foot Updated:  03/11/15 1325    Narrative:      Specimen/Source: Wound/Left Foot  Collected: 03/10/2015 20:57     Status: Valued      Last Updated: 03/11/2015 13:25           (1) *Specimen Wound      *Source Detail Left Foot             Gram Stain (Final)      Few Epithelial Cells      Many WBC's Seen      Moderate Gram Positive Cocci       ISO #1 (Prelim)      Moderate Growth      Probable Strep      Identification To Follow                Rads:   Xr Foot Left Ap Lateral And Oblique    03/10/2015  Recurrent soft tissue thickening and ulceration at the medial aspect of the left first MTP joint, with progressive erosion of the metatarsal head favoring osteomyelitis in this context over gout. ReadingStation:[email or web address]    Mri Foot Left W Wo Contrast    03/10/2015  Soft tissue ulcer medial aspect left first MTP joint, accompanied by peripherally enhancing fluid collection along the abductor hallucis, synovitis, joint effusion, and osteomyelitis with progressive erosion of the metatarsal head.  ReadingStation:[email or web address]      Assessment:   [name] is a 50 y.o. male with PMHx of DM with neuropathy, HTN, admitted to [facility] on 03/10/2015 for a draining left foot wound, concerning for cellulitis and osteomyelitis.    Afebrile  WBC - 9.8  ESR - 74, CRP - 17.1    MRI (1/25) - osteomyelitis with progressive erosion of the 1st metatarsal head.  X-ray (1/25) - progressive erosion of the 1st metatarsal head favoring osteomyelitis.     Wound cx (1/25) - gram  stain; GPC    Plan:   - Medical and pain management  per primary service   - Continue broad spectrum antibiotics for empiric coverage  - Appreciate pre-operative clearance and risk stratification per Medicine  - Will take to OR tomorrow for I&D of left foot at 5:15PM  - NPO after 8AM tomorrow  - Dressed wound with Iodoform packing/Betadine/DSD  - NWB to forefoot, heel touch permitted for transfers  - Discussed plan with Dr. Evette Doffing. Thank you for the consult.     Signed by: Erin Hearing, DPM PGY- 1  Pager [phone]    Pt seen and examined.  Agree with above.

## 2015-03-11 NOTE — Plan of Care (Signed)
Problem: Safety  Goal: Patient will be free from injury during hospitalization  Outcome: Progressing

## 2015-03-12 ENCOUNTER — Observation Stay: Payer: BC Managed Care – PPO

## 2015-03-12 ENCOUNTER — Observation Stay: Payer: BC Managed Care – PPO | Admitting: Certified Registered"

## 2015-03-12 ENCOUNTER — Encounter: Payer: Self-pay | Admitting: Anesthesiology

## 2015-03-12 ENCOUNTER — Encounter
Admission: EM | Disposition: A | Discharge status: Home Health | Payer: Self-pay | Source: Home / Self Care | Attending: Internal Medicine

## 2015-03-12 HISTORY — PX: DEBRIDEMENT & IRRIGATION, LOWER EXTREMITY: SHX3682

## 2015-03-12 LAB — VH DEXTROSE STICK GLUCOSE
Glucose POCT: 161 mg/dL — ABNORMAL HIGH (ref 70–99)
Glucose POCT: 166 mg/dL — ABNORMAL HIGH (ref 70–99)
Glucose POCT: 159 mg/dL — ABNORMAL HIGH (ref 70–99)

## 2015-03-12 LAB — ECG 12-LEAD
P Wave Axis: 1 deg
P-R Interval: 141 ms
Patient Age: 49 years
Q-T Interval(Corrected): 439 ms
Q-T Interval: 345 ms
QRS Axis: 27 deg
QRS Duration: 89 ms
T Axis: 35 years
Ventricular Rate: 97 //min

## 2015-03-12 SURGERY — DEBRIDEMENT AND IRRIGATION, LOWER EXTREMITY
Anesthesia: Anesthesia MAC / Sedation | Site: Foot | Laterality: Left | Wound class: Dirty or Infected

## 2015-03-12 MED ORDER — MIDAZOLAM HCL 2 MG/2ML IJ SOLN
INTRAMUSCULAR | Status: AC
Start: 2015-03-12 — End: ?
  Filled 2015-03-12: qty 2

## 2015-03-12 MED ORDER — BACITRACIN +/- ZINC 500 UNIT/GM EX OINT (WRAP)
TOPICAL_OINTMENT | CUTANEOUS | Status: AC
Start: 2015-03-12 — End: ?
  Filled 2015-03-12: qty 28.35

## 2015-03-12 MED ORDER — ALBUTEROL SULFATE (2.5 MG/3ML) 0.083% IN NEBU
2.5000 mg | INHALATION_SOLUTION | Freq: Once | RESPIRATORY_TRACT | Status: DC
Start: 2015-03-12 — End: 2015-03-12

## 2015-03-12 MED ORDER — FENTANYL CITRATE (PF) 50 MCG/ML IJ SOLN (WRAP)
25.0000 ug | INTRAMUSCULAR | Status: DC | PRN
Start: 2015-03-12 — End: 2015-03-12

## 2015-03-12 MED ORDER — ACETAMINOPHEN 650 MG RE SUPP
650.0000 mg | RECTAL | Status: DC | PRN
Start: 2015-03-12 — End: 2015-03-17

## 2015-03-12 MED ORDER — MIDAZOLAM HCL 5 MG/5ML IJ SOLN
INTRAMUSCULAR | Status: DC | PRN
Start: 2015-03-12 — End: 2015-03-12
  Administered 2015-03-12 (×2): 1 mg via INTRAVENOUS

## 2015-03-12 MED ORDER — BUPIVACAINE HCL (PF) 0.25 % IJ SOLN
INTRAMUSCULAR | Status: AC
Start: 2015-03-12 — End: ?
  Filled 2015-03-12: qty 30

## 2015-03-12 MED ORDER — PROPOFOL 10 MG/ML IV EMUL (WRAP)
INTRAVENOUS | Status: AC
Start: 2015-03-12 — End: ?
  Filled 2015-03-12: qty 40

## 2015-03-12 MED ORDER — ONDANSETRON 4 MG PO TBDP
4.0000 mg | ORAL_TABLET | ORAL | Status: DC | PRN
Start: 2015-03-12 — End: 2015-03-17
  Filled 2015-03-12: qty 1

## 2015-03-12 MED ORDER — ACETAMINOPHEN 325 MG PO TABS
650.0000 mg | ORAL_TABLET | Freq: Four times a day (QID) | ORAL | Status: DC
Start: 2015-03-12 — End: 2015-03-12

## 2015-03-12 MED ORDER — VH PROPOFOL INFUSION 10 MG/ML (WRAPPED)
INTRAVENOUS | Status: DC | PRN
Start: 2015-03-12 — End: 2015-03-12
  Administered 2015-03-12: 50 ug/kg/min via INTRAVENOUS

## 2015-03-12 MED ORDER — MEPERIDINE HCL 25 MG/ML IJ SOLN
12.5000 mg | Freq: Once | INTRAMUSCULAR | Status: DC | PRN
Start: 2015-03-12 — End: 2015-03-12

## 2015-03-12 MED ORDER — FAMOTIDINE 20 MG PO TABS
20.0000 mg | ORAL_TABLET | Freq: Every day | ORAL | Status: DC
Start: 2015-03-13 — End: 2015-03-17
  Administered 2015-03-13 – 2015-03-17 (×5): 20 mg via ORAL
  Filled 2015-03-12 (×5): qty 1

## 2015-03-12 MED ORDER — EPHEDRINE SULFATE 50 MG/ML IJ SOLN
10.0000 mg | INTRAMUSCULAR | Status: DC | PRN
Start: 2015-03-12 — End: 2015-03-12

## 2015-03-12 MED ORDER — BACITRACIN +/- ZINC 500 UNIT/GM EX OINT (WRAP)
TOPICAL_OINTMENT | CUTANEOUS | Status: DC | PRN
Start: 2015-03-12 — End: 2015-03-12
  Administered 2015-03-12: 1 via TOPICAL

## 2015-03-12 MED ORDER — VH PHENYLEPHRINE 120 MCG/ML IV BOLUS (ANESTHESIA)
PREFILLED_SYRINGE | INTRAVENOUS | Status: DC | PRN
Start: 2015-03-12 — End: 2015-03-12
  Administered 2015-03-12: 120 ug via INTRAVENOUS

## 2015-03-12 MED ORDER — VALLEY PROMETHAZINE 50 MG/0.4 ML TOPICAL GEL UD (RPKG)
12.5000 mg | Freq: Once | TOPICAL | Status: DC | PRN
Start: 2015-03-12 — End: 2015-03-12

## 2015-03-12 MED ORDER — ONDANSETRON HCL 4 MG/2ML IJ SOLN
4.0000 mg | Freq: Three times a day (TID) | INTRAMUSCULAR | Status: DC | PRN
Start: 2015-03-12 — End: 2015-03-12

## 2015-03-12 MED ORDER — PROPOFOL 10 MG/ML IV EMUL (WRAP)
INTRAVENOUS | Status: DC | PRN
Start: 2015-03-12 — End: 2015-03-12
  Administered 2015-03-12: 60 mg via INTRAVENOUS
  Administered 2015-03-12 (×3): 20 mg via INTRAVENOUS

## 2015-03-12 MED ORDER — VH HYDROMORPHONE HCL PF 1 MG/ML CARPUJECT
0.5000 mg | INTRAMUSCULAR | Status: DC | PRN
Start: 2015-03-12 — End: 2015-03-12

## 2015-03-12 MED ORDER — LACTATED RINGERS IV SOLN
INTRAVENOUS | Status: DC
Start: 2015-03-12 — End: 2015-03-13

## 2015-03-12 MED ORDER — ONDANSETRON 4 MG PO TBDP
4.0000 mg | ORAL_TABLET | Freq: Three times a day (TID) | ORAL | Status: DC | PRN
Start: 2015-03-12 — End: 2015-03-12

## 2015-03-12 MED ORDER — BUPIVACAINE HCL (PF) 0.5 % IJ SOLN
INTRAMUSCULAR | Status: AC
Start: 2015-03-12 — End: ?
  Filled 2015-03-12: qty 30

## 2015-03-12 MED ORDER — DEXTROSE 50 % IV SOLN
25.0000 mL | INTRAVENOUS | Status: DC | PRN
Start: 2015-03-12 — End: 2015-03-17

## 2015-03-12 MED ORDER — FENTANYL CITRATE (PF) 50 MCG/ML IJ SOLN (WRAP)
INTRAMUSCULAR | Status: AC
Start: 2015-03-12 — End: ?
  Filled 2015-03-12: qty 2

## 2015-03-12 MED ORDER — INSULIN ASPART 100 UNIT/ML SC SOPN
1.0000 [IU] | PEN_INJECTOR | Freq: Every evening | SUBCUTANEOUS | Status: DC | PRN
Start: 2015-03-12 — End: 2015-03-17
  Administered 2015-03-13 – 2015-03-14 (×2): 2 [IU] via SUBCUTANEOUS

## 2015-03-12 MED ORDER — HYDRALAZINE HCL 20 MG/ML IJ SOLN
10.0000 mg | Freq: Four times a day (QID) | INTRAMUSCULAR | Status: DC | PRN
Start: 2015-03-12 — End: 2015-03-12

## 2015-03-12 MED ORDER — BUPIVACAINE HCL (PF) 0.5 % IJ SOLN
INTRAMUSCULAR | Status: DC | PRN
Start: 2015-03-12 — End: 2015-03-12
  Administered 2015-03-12: 10 mL

## 2015-03-12 MED ORDER — HYDRALAZINE HCL 20 MG/ML IJ SOLN
10.0000 mg | Freq: Four times a day (QID) | INTRAMUSCULAR | Status: DC | PRN
Start: 2015-03-12 — End: 2015-03-17

## 2015-03-12 MED ORDER — ACETAMINOPHEN 160 MG/5ML PO SOLN
650.0000 mg | ORAL | Status: DC | PRN
Start: 2015-03-12 — End: 2015-03-17
  Filled 2015-03-12: qty 20.3

## 2015-03-12 MED ORDER — ACETAMINOPHEN 325 MG PO TABS
650.0000 mg | ORAL_TABLET | ORAL | Status: DC | PRN
Start: 2015-03-12 — End: 2015-03-17

## 2015-03-12 MED ORDER — VH HYDROMORPHONE HCL PF 1 MG/ML CARPUJECT
0.5000 mg | INTRAMUSCULAR | Status: DC | PRN
Start: 2015-03-12 — End: 2015-03-17

## 2015-03-12 MED ORDER — ONDANSETRON HCL 4 MG/2ML IJ SOLN
4.0000 mg | Freq: Once | INTRAMUSCULAR | Status: DC | PRN
Start: 2015-03-12 — End: 2015-03-12

## 2015-03-12 MED ORDER — LACTATED RINGERS IV SOLN
INTRAVENOUS | Status: DC | PRN
Start: 2015-03-12 — End: 2015-03-12

## 2015-03-12 MED ORDER — DIPHENHYDRAMINE HCL 50 MG/ML IJ SOLN
25.0000 mg | Freq: Four times a day (QID) | INTRAMUSCULAR | Status: DC | PRN
Start: 2015-03-12 — End: 2015-03-12

## 2015-03-12 MED ORDER — LIDOCAINE HCL 1 % IJ SOLN
INTRAMUSCULAR | Status: DC | PRN
Start: 2015-03-12 — End: 2015-03-12
  Administered 2015-03-12: 10 mL

## 2015-03-12 MED ORDER — LIDOCAINE HCL (PF) 2 % IJ SOLN
INTRAMUSCULAR | Status: AC
Start: 2015-03-12 — End: ?
  Filled 2015-03-12: qty 10

## 2015-03-12 MED ORDER — FENTANYL CITRATE (PF) 50 MCG/ML IJ SOLN (WRAP)
INTRAMUSCULAR | Status: DC | PRN
Start: 2015-03-12 — End: 2015-03-12
  Administered 2015-03-12 (×2): 12.5 ug via INTRAVENOUS

## 2015-03-12 MED ORDER — ONDANSETRON HCL 4 MG/2ML IJ SOLN
4.0000 mg | INTRAMUSCULAR | Status: DC | PRN
Start: 2015-03-12 — End: 2015-03-17

## 2015-03-12 MED ORDER — LIDOCAINE HCL (PF) 1 % IJ SOLN
INTRAMUSCULAR | Status: AC
Start: 2015-03-12 — End: ?
  Filled 2015-03-12: qty 30

## 2015-03-12 MED ORDER — INSULIN ASPART 100 UNIT/ML SC SOPN
2.0000 [IU] | PEN_INJECTOR | Freq: Three times a day (TID) | SUBCUTANEOUS | Status: DC | PRN
Start: 2015-03-12 — End: 2015-03-12

## 2015-03-12 SURGICAL SUPPLY — 29 items
BANDAGE ESMARK 4IN  STERILE LF (Dressings) IMPLANT
BANDAGE KLING 2IN (Dressings) ×2 IMPLANT
BANDAGE KLING 3 STERILE (Dressings) IMPLANT
BANDAGE SOF BAND KERLIX 4.5 IN (Dressings) ×2 IMPLANT
BLADE SAG FINE 25X9.5MM (Supply) ×2 IMPLANT
BLADE SCALPEL #15 (Supply) ×2 IMPLANT
CEMENT BONE GENTAMICIN DEPUY ×2 IMPLANT
CONTAINER STER SPEC PEEL/PACK (Supply) IMPLANT
CUFF TOURNIQUET 24 (Supply) IMPLANT
CULTURETTE BBL (Supply) IMPLANT
DRSG COMBINE 8 X 7.5 (Dressings) IMPLANT
GAUZE PLAIN PACKING 1 IN X 5Y (Dressings) IMPLANT
GLOVE BIOGEL UT PI SURG SZ 7 (Supply) ×2 IMPLANT
GOWN STD LG W/TOWEL REUSE (Supply) ×6 IMPLANT
IRRG INTERPULSE W/SX FAN SPRAY (Supply) ×2 IMPLANT
KIT MIX EVAC II HIGH VACUUM (Supply) ×2 IMPLANT
NDL HYPO 25GA X 1.5 (Supply) ×2 IMPLANT
PACK EXTREMITY-LF (Supply) ×2 IMPLANT
SCRUB BETADINE 4OZ (Supply) ×2 IMPLANT
SLEEVE ARM DISP SINGLE-USE (Supply) IMPLANT
SOL SALINE IRRIG 3000ML (Supply) ×2 IMPLANT
SOL SALINE IRRIG 500ML (Solutions) ×2 IMPLANT
SOL WATER STERILE IRRG 500ML (Solutions) ×2 IMPLANT
SOLUTION POVIDONE IODINE 4 OZ (Supply) ×2 IMPLANT
SUT MONO PLUS 4-0 PS2  MCP426H (Supply) IMPLANT
SYRINGE 12CC CONTROL LL (Supply) ×2 IMPLANT
TOWEL (FAN-FOLD) 6/PK (Supply) ×4 IMPLANT
TRAY SKIN PREP  DRY (Supply) ×2 IMPLANT
UNDERPAD BLUE 23X36  LF (Supply) ×2 IMPLANT

## 2015-03-12 NOTE — Anesthesia Postprocedure Evaluation (Signed)
Anesthesia Post Evaluation    Patient: [name]    Procedures performed: Procedure(s) with comments:  DEBRIDEMENT & IRRIGATION, LOWER EXTREMITY - I&D LEFT FOOT FOR BONE INFECTION    Anesthesia type: MAC    Patient location:Phase I PACU    Last vitals:   Filed Vitals:    03/12/15 1945   BP: 149/81   Pulse: 93   Temp: 36.4 C (97.5 F)   Resp: 15   SpO2: 97%       Post pain: Patient not complaining of pain, continue current therapy      Mental Status:awake    Respiratory Function: tolerating room air    Cardiovascular: stable    Nausea/Vomiting: patient not complaining of nausea or vomiting    Hydration Status: adequate    Post assessment: no apparent anesthetic complications

## 2015-03-12 NOTE — Plan of Care (Signed)
Problem: Safety  Goal: Patient will be free from injury during hospitalization  Outcome: Progressing

## 2015-03-12 NOTE — Brief Op Note (Signed)
BRIEF OP NOTE    Date Time: 7:01 PM, 03/12/2015    Patient Name:   [name]    Date of Operation:   03/12/2015    Providers Performing:   Surgeon(s):  Janora Norlander, DPM      Assistants:   Berline Chough. Osborne Casco PGY- 1  Page podiatry resident [phone]) for order clarifications.    Diagnosis:   1. Chronic diabetic ulceration, left foot.  2. Osteomyelitis, left foot.      Procedure:   Procedure(s) (LRB):  DEBRIDEMENT & IRRIGATION, LOWER EXTREMITY (Left)        Anesthesia:    Monitor Anesthesia Care   No responsible provider has been recorded for the case.   Anesthesiologist: Normajean Glasgow, MD  CRNA: Jose Persia, CRNA     Estimated Blood Loss:    Minimal    Hemostasis: Direct mechanical pressure, electrocautery, anatomical dissection.    Implants:     Implants     Implant    Cement Bone Gentamicin Depuy [id number] - Implanted     Inventory item: Cement Bone Gentamicin Depuy Model/Cat no.: [id number]    Serial no.:  Manufacturer: [id number]    Lot no.: [id number] Size:     As of 03/12/2015     Status: Implanted                         Materials:   3-0 prolene    Injectables:  Preoperative block with 20 mL of lidocaine 1% plain and marcaine 0.5% plain, 1:1 mix.      Specimens: Resected bone of left 1st metatarsal head, sent to pathology and microbiology    Antibiotics:   None as patient received antibiotics on floors    Drains:   none    Complications:   None.    Findings: Osteolytic left 1st metatarsal head with purulence noted    Condition:   stable. Vital signs stable. Vascular status intact to operative foot.                                                                            Signed by: [facility], PGY- 1  Pager# [phone]

## 2015-03-12 NOTE — UM Notes (Signed)
[facility] Utilization Management Review Sheet    NAME: TORAINO SCOPEL  MR#: [id number]    CSN#: [id number]    ROOM: 2518/2518-A AGE: 50 y.o.    ADMIT DATE AND TIME: 03/10/2015  6:52 PM    PATIENT CLASS: OBSERVATION  03/10/15 @ 2058 ADMIT OBSERVATION- OBS UNIT    ATTENDING PHYSICIAN: Margot Ables, MD  PAYOR:Payor: OUT OF STATE BLUE CROSS / Plan: Frannie / Product Type: *No Product type* /     AUTH #: NPR    DIAGNOSIS:     ICD-10-CM    1. Diabetic ulcer of left foot associated with type 2 diabetes mellitus E11.621     L97.529    2. Cellulitis of left foot L03.116    3. Osteomyelitis of left foot, unspecified chronicity M86.9        HISTORY:   Past Medical History   Diagnosis Date   . Seasonal allergic rhinitis    . Diabetes mellitus    . Disc    . Hypertension    . Diabetic ulcer of left foot 12/26/14     DAY 3  OBSERVATION FOLLOW-UP  03/12/15   LABS: GLUC POCT 161-259  VS: BP 117/69 mmHg  Pulse 94  Temp(Src) 99.2 F (37.3 C) (Oral)  Resp 16  Ht 1.676 m (5\' 6" )  Wt 101.9 kg (224 lb 10.4 oz)  BMI 36.28 kg/m2  SpO2 99%  NPO FOR WOUND DEBRIDMENT AT 5 PM then plan [place] home  CURRENT ORDERS: ZOSYN IVS Q 6 HRS, VANCOMYCIN 1000 IV Q 12, VS Q 4 HRS, CONTINUOUS PULSE OX, GLUC POCT AC AND HS, NPO AFTER 8 AM FOR OR TODAY  NPO FOR WOUND DEBRIDMENT AT 5 PM then plan [place] home    DATE OF REVIEW: 03/12/2015    Tressie Stalker, RN  Utilization Management  Case Management Department    [facility]  [address]  [place], [place] [address]  T [phone]    F [phone]   [email or web address]

## 2015-03-12 NOTE — Progress Notes (Addendum)
OBSERVATION UNIT  PROGRESS NOTE      Patient: [name]  Date: 03/12/2015   LOS:  Days  Admission Date: 03/10/2015   MRN: [id number]  Attending: Earnest Rosier       BACKGROUND     NUEL COOKSEY is a 50 y.o. male admitted with diabetic foot ulcer/osteomyelitis    Nutrition: Consistant Carbohydrate Diet  GI Prophylaxis: not indicated  DVT Prophylaxis: low molecular weight heparin  Code Status: full code    SUBJECTIVE     Patient denies any pain or feeling ill. He states that he is still hungry after breakfast. He has been evaluated by surgery. Reports cold chills last night.    ASSESSMENT     #1: Diabetic foot ulcer with osteomyelitis  #2: Patient stable for surgery    PLAN     #1: Patient is scheduled for surgery 5 PM    DISPO: home with family    Care Plan discussed with nursing, consultants, case manager.    PROCEDURE     None    MEDICATIONS     Current Facility-Administered Medications   Medication Dose Route Frequency   . aspirin  81 mg Oral Daily   . atorvastatin  10 mg Oral QHS   . enoxaparin  40 mg Subcutaneous Q24H   . gabapentin  300 mg Oral TID   . insulin aspart  7 Units Subcutaneous TID AC   . insulin glargine  25 Units Subcutaneous QAM   . lactobacillus species  50 Billion CFU Oral Daily   . lisinopril  20 mg Oral QHS   . metFORMIN  1,000 mg Oral BID Meals   . piperacillin-tazobactam  3.375 g Intravenous Q6H   . vancomycin  1,250 mg Intravenous Q12H   . vancomycin therapy placeholder   Does not apply See Admin Instructions       ROS     Remainder of 10 point ROS as above or otherwise negative    PHYSICAL EXAM     Filed Vitals:    03/12/15 0701   BP: 117/71   Pulse:    Temp: 98.4 F (36.9 C)   Resp: 17   SpO2: 99%     Temperature: Temp  Min: 98.4 F (36.9 C)  Max: 99.4 F (37.4 C)  Pulse: No Data Recorded  Respiratory: Resp  Min: 17  Max: 18  Non-Invasive BP: BP  Min: 106/75  Max: 140/74  Pulse Oximetry SpO2  Min: 98 %  Max: 99 %    Intake and Output Summary (Last 24 hours) at Date Time  No  intake or output data in the 24 hours ending 03/12/15 0919    GEN APPEARANCE: Normal;  A&OX3  HEENT: PERLA; EOMI; Conjunctiva Clear  NECK: Supple; No bruits  CVS: RRR, S1, S2; No M/G/R  LUNGS: CTAB; No Wheezes; No Rhonchi: No rales  ABD: Soft; No TTP; + Normoactive BS  SKIN: No rash or Lesions except for the diabetic foot ulcer  NEURO: CN 2-12 intact; No Focal neurological deficits    LABS       Recent Labs  Lab 03/11/15  0511 03/10/15  1919   WBC 9.8 12.0*   RBC 3.69* 4.26   HEMOGLOBIN 11.6* 13.3   HEMATOCRIT 34.3* 40.0   MCV 93 94   PLT CT 220 222         Recent Labs  Lab 03/11/15  0511 03/11/15  0037 03/10/15  1919   SODIUM 136 135* 136  POTASSIUM 4.0 4.2 4.7   CHLORIDE 101 100 99   CO2 26.1 26.4 26.3   BUN 30* 34* 31*   CREATININE 1.22 1.27 1.50*   GLUCOSE 246* 283* 435*   CALCIUM 8.3* 8.3* 9.4       Recent Labs  Lab 03/11/15  0511   ALT 15   AST (SGOT) 14   BILIRUBIN, TOTAL 0.4   ALBUMIN 2.1*   ALKALINE PHOSPHATASE 88       RADIOLOGY     Radiological Procedure reviewed.    MRI FOOT LEFT W WO CONTRAST   Final Result   Soft tissue ulcer medial aspect left first MTP joint, accompanied by peripherally enhancing fluid collection along the   abductor hallucis, synovitis, joint effusion, and osteomyelitis with progressive erosion of the metatarsal head.          ReadingStation:[email or web address]      XR FOOT LEFT AP LATERAL AND OBLIQUE   Final Result   Recurrent soft tissue thickening and ulceration at the medial aspect of the left first MTP joint, with progressive   erosion of the metatarsal head favoring osteomyelitis in this context over gout.      ReadingStation:[email or web address]        Signed,  Earnest Rosier    9:19 AM 03/12/2015

## 2015-03-12 NOTE — Anesthesia Preprocedure Evaluation (Signed)
Anesthesia Evaluation    AIRWAY    Mallampati: II    Neck ROM: full  Mouth Opening:full   CARDIOVASCULAR    regular       DENTAL           PULMONARY    clear to auscultation     OTHER FINDINGS                      Anesthesia Plan    ASA 3     MAC                     intravenous induction   Detailed anesthesia plan: MAC        Post op pain management: per surgeon    informed consent obtained    Plan discussed with CRNA.      pertinent labs reviewed

## 2015-03-12 NOTE — H&P (Signed)
[facility] NOTE                                                                                                                                                    HISTORY AND PHYSICAL      Primary Care Providers:  PCP: Rhunette Croft, MD  Podiatrist: Dorene Sorrow, DPM    Chief Complaint:  DFU    HPI:  [name] is a 50 y.o. 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, essential hypertension and dyslipidemia, initially admitted to [facility] observation unit for diabetic foot ulcer with cellulitis and abscess  Patient had MRI of the foot showed osteomyelitis of the left hallux with abscess  Patient was started on broad-spectrum antibiotics and podiatry consult was done  Patient was taken to the OR and had incision and drainage and debridement done  Medicine service consulted for inpatient admission  Patient seen in PACU; Main complaint is mild pain in the left foot  Hemodynamically stable    Past Medical History:   Past Medical History   Diagnosis Date   . Seasonal allergic rhinitis    . Diabetes mellitus    . Disc    . Hypertension    . Diabetic ulcer of left foot 12/26/14       Past Surgical History:  Past Surgical History   Procedure Laterality Date   . Hand surgery         Allergies:  Allergies   Allergen Reactions   . Flu Virus Vaccine Anaphylaxis       Outpatient Medications:  Current Facility-Administered Medications   Medication Dose Route Frequency Provider Last Rate Last Dose   . acetaminophen (TYLENOL) tablet 650 mg  650 mg Oral Q4H PRN English, Tiffany R, PA        Or   . acetaminophen (TYLENOL) 160 MG/5ML oral solution 650 mg  650 mg Per NG tube Q4H PRN English, Tiffany R, PA        Or   . acetaminophen (TYLENOL)  suppository 650 mg  650 mg Rectal Q4H PRN English, Tiffany R, PA       . acetaminophen (TYLENOL) tablet 650 mg  650 mg Oral Q4H PRN Nicola Police, MD        Or   . acetaminophen (TYLENOL) 160 MG/5ML oral solution 650 mg  650 mg Per NG tube Q4H PRN Nicola Police, MD        Or   . acetaminophen (TYLENOL) suppository 650 mg  650 mg Rectal Q4H PRN Nicola Police, MD       . albuterol (PROVENTIL) nebulizer solution 2.5 mg  2.5 mg Nebulization Once Dennard, Adonis Brook, MD   2.5 mg at 03/12/15 1850   .  aspirin chewable tablet 81 mg  81 mg Oral Daily English, Tiffany R, PA   81 mg at 03/12/15 1122   . atorvastatin (LIPITOR) tablet 10 mg  10 mg Oral QHS English, Tiffany R, PA   10 mg at 03/11/15 2116   . cyclobenzaprine (FLEXERIL) tablet 10 mg  10 mg Oral Q8H PRN English, Tiffany R, PA       . dextrose 50 % bolus 25 mL  25 mL Intravenous PRN English, Tiffany R, PA       . dextrose 50 % bolus 25 mL  25 mL Intravenous PRN Nicola Police, MD       . diphenhydrAMINE (BENADRYL) injection 25 mg  25 mg Intravenous Q6H PRN Dennard, Adonis Brook, MD       . enoxaparin (LOVENOX) syringe 40 mg  40 mg Subcutaneous Q24H English, Tiffany R, PA   40 mg at 03/11/15 2116   . ePHEDrine injection 10 mg  10 mg Intravenous Q5 Min PRN Dennard, Adonis Brook, MD       . famotidine (PEPCID) tablet 20 mg  20 mg Oral Daily Nicola Police, MD       . FentaNYL Citrate (PF) (SUBLIMAZE) injection 25 mcg  25 mcg Intravenous Q5 Min PRN Dennard, Adonis Brook, MD       . gabapentin (NEURONTIN) capsule 300 mg  300 mg Oral TID English, Tiffany R, PA   300 mg at 03/12/15 1122   . hydrALAZINE (APRESOLINE) injection 10 mg  10 mg Intravenous 4X Daily PRN Nicola Police, MD       . HYDROmorphone (DILAUDID) injection 0.5 mg  0.5 mg Intravenous Q5 Min PRN Dennard, Adonis Brook, MD       . HYDROmorphone (DILAUDID) injection 0.5 mg  0.5 mg Intravenous Q2H PRN Christianne Borrow M, DPM       . insulin aspart (NovoLOG) injection pen 1-4 Units  1-4 Units  Subcutaneous QHS PRN Nicola Police, MD       . insulin aspart (NovoLOG) injection pen 2-12 Units  2-12 Units Subcutaneous Q6H PRN Fransisco Beau, [place]   4 Units at 03/11/15 2116   . insulin aspart (NovoLOG) injection pen 2-12 Units  2-12 Units Subcutaneous TID AC PRN Nicola Police, MD       . insulin aspart (NovoLOG) injection pen 7 Units  7 Units Subcutaneous TID AC English, Tiffany R, PA   7 Units at 03/11/15 1715   . insulin glargine (LANTUS SOLOSTAR) injection pen 25 Units  25 Units Subcutaneous QAM Rica Mast, NP   25 Units at 03/11/15 0905   . lactated ringers infusion   Intravenous Continuous Janora Norlander, DPM 50 mL/hr at 03/12/15 1637     . lactobacillus species (BIO-K PLUS) capsule 50 Billion CFU  50 Billion CFU Oral Daily English, Tiffany R, PA   50 Billion CFU at 03/12/15 1122   . lisinopril (PRINIVIL,ZESTRIL) tablet 20 mg  20 mg Oral QHS English, Tiffany R, PA   20 mg at 03/11/15 2116   . meperidine (DEMEROL) injection 12.5 mg  12.5 mg Intravenous Once PRN Dennard, Adonis Brook, MD       . metFORMIN (GLUCOPHAGE) tablet 1,000 mg  1,000 mg Oral BID Meals English, Tiffany R, PA   1,000 mg at 03/11/15 1715   . naloxone (NARCAN) injection 0.4 mg  0.4 mg Intravenous PRN English, Tiffany R, PA       . ondansetron (ZOFRAN-ODT) disintegrating tablet 4 mg  4 mg Oral Q8H PRN English, Tiffany R, PA        Or   . ondansetron (ZOFRAN) injection 4 mg  4 mg Intravenous Q8H PRN English, Tiffany R, PA       . ondansetron (ZOFRAN) injection 4 mg  4 mg Intravenous Once PRN Dennard, Adonis Brook, MD       . oxyCODONE-acetaminophen (PERCOCET) 5-325 MG per tablet 1-2 tablet  1-2 tablet Oral Q4H PRN English, Tiffany R, PA   2 tablet at 03/12/15 0322   . piperacillin-tazobactam (ZOSYN) 3.375 g in sodium chloride 0.9 % 100 mL IVPB mini-bag plus  3.375 g Intravenous Q6H English, Tiffany R, PA 200 mL/hr at 03/12/15 1637 3.375 g at 03/12/15 1637   . promethazine (PHENERGAN) 50 mg/0.4 mL topical gel 12.5 mg  12.5  mg Topical Once PRN Dennard, Adonis Brook, MD       . vancomycin (VANCOCIN) 1250 mg in sodium chloride 0.9% 250 mL  1,250 mg Intravenous Q12H Margot Ables, MD 120 mL/hr at 03/12/15 1724 1,250 mg at 03/12/15 1724   . vancomycin therapy placeholder   Does not apply See Admin Instructions Margot Ables, MD           Social History:  Social History   Substance Use Topics   . Smoking status: Never Smoker    . Smokeless tobacco: Never Used   . Alcohol Use: No       Illicit Drug Use:   Reviewed and negative    Designated Health Care Proxy:   Cyndia Skeeters, partner    Family History:  Family history was obtained and was positive for type 2 diabetes mellitus in his mother     Review of Systems:  All systems reviewed including eyes, ENT, cardiovascular, respiratory, gastrointestinal, genitourinary, psychiatric, neurologic, integumentary, allergic/hematology,  are reviewed and found unremarkable except pertinent positives mentioned in the history of present illness and past medical history.     Physical Exam:    Vital Signs:  BP 149/81 mmHg  Pulse 93  Temp(Src) 97.5 F (36.4 C) (Tympanic)  Resp 15  Ht 1.676 m (5\' 6" )  Wt 101.9 kg (224 lb 10.4 oz)  BMI 36.28 kg/m2  SpO2 97%    Intake/Output Summary (Last 24 hours) at 03/12/15 1955  Last data filed at 03/12/15 1836   Gross per 24 hour   Intake    500 ml   Output     10 ml   Net    490 ml         1) General Appearance:  Well developed, well nourished middle-aged Caucasian male, in no apparent acute      cardiorespiratory nor painful distress.     2) HEENT: Head is atraumatic and normocephalic.       Eyes: Pink conjunctiva, anicteric sclera. Pupils are equally reactive to light and      accommodation. Extraocular muscles are intact.      ENT:  Patient has intact external auditory canal. No abnormal lesions or bleeding from      nose. Oral mucosa moist with no pharyngeal congestion, erythema or swelling.    3) Neck: Supple, with full range of motion without any  discomfort. Trachea is central,       no JVD noted, no carotid bruit, no cervical masses palpable.    4) Chest: Clear to auscultation bilaterally, no adventitial sounds heard.    5) CVS:  S1, S2 normal intensity and regular rhythm. No murmurs, rubs or  gallops  appreciated.    6) Abdomen:  Soft, non tender, non distended, no palpable mass. Bowel sounds audible.      No costovertebral angle tenderness noted.    7) Extremities: 2+ pulses without edema, cyanosis or clubbing. Left foot in postsurgical dressing    8) Musculoskeletal: No joint deformities, tenderness or swelling noted. Full range of      motion in all the joints examined.    9) Skin: Warm with normal skin turgor, no lesions seen.    10) Lymphatics: No lymphadenopathy in axillary, cervial and inguinal area.     11) Neurological: Alert and oriented x 4. Cranial nerves II-XII intact. No gross focal        neurological deficits noted.    12) Psychiatric:  Mood and affect are appropriate.     Labs: Reviewed by me    Labs Reviewed   CBC AND DIFFERENTIAL - Abnormal; Notable for the following:     WBC 12.0 (*)     NEUTROPHIL % 79.8 (*)     Lymphocytes 12.3 (*)     Neutrophils Absolute 9.5 (*)     All other components within normal limits   BASIC METABOLIC PANEL - Abnormal; Notable for the following:     Glucose 435 (*)     Creatinine 1.50 (*)     BUN 31 (*)     All other components within normal limits   SEDIMENTATION RATE - Abnormal; Notable for the following:     Sed Rate 74 (*)     All other components within normal limits   C-REACTIVE PROTEIN - Abnormal; Notable for the following:     C-Reactive Protein 17.07 (*)     All other components within normal limits   VH DEXTROSE STICK GLUCOSE - Abnormal; Notable for the following:     Glucose, POCT 336 (*)     All other components within normal limits   BASIC METABOLIC PANEL - Abnormal; Notable for the following:     Sodium 135 (*)     Calcium 8.3 (*)     Glucose 283 (*)     BUN 34 (*)     All other components within  normal limits    Narrative:     This order is a replacement of the rejected order with accession number [id number].   CBC AND DIFFERENTIAL - Abnormal; Notable for the following:     RBC 3.69 (*)     Hemoglobin 11.6 (*)     Hematocrit 34.3 (*)     All other components within normal limits   COMPREHENSIVE METABOLIC PANEL - Abnormal; Notable for the following:     Calcium 8.3 (*)     Glucose 246 (*)     BUN 30 (*)     Albumin 2.1 (*)     Albumin/Globulin Ratio 0.53 (*)     All other components within normal limits   VH DEXTROSE STICK GLUCOSE - Abnormal; Notable for the following:     Glucose, POCT 201 (*)     All other components within normal limits   VH DEXTROSE STICK GLUCOSE - Abnormal; Notable for the following:     Glucose, POCT 259 (*)     All other components within normal limits   VH DEXTROSE STICK GLUCOSE - Abnormal; Notable for the following:     Glucose, POCT 201 (*)     All other components within normal limits   VH DEXTROSE STICK GLUCOSE - Abnormal; Notable for  the following:     Glucose, POCT 218 (*)     All other components within normal limits   VH DEXTROSE STICK GLUCOSE - Abnormal; Notable for the following:     Glucose, POCT 161 (*)     All other components within normal limits   VH DEXTROSE STICK GLUCOSE - Abnormal; Notable for the following:     Glucose, POCT 166 (*)     All other components within normal limits   VH DEXTROSE STICK GLUCOSE - Abnormal; Notable for the following:     Glucose, POCT 159 (*)     All other components within normal limits   VH CULTURE AND SMEAR, WOUND    Narrative:     Specimen/Source: Wound/Left Foot  Collected: 03/10/2015 20:57     Status: Final      Last Updated: 03/12/2015 06:55           (1) *Specimen Wound      *Source Detail Left Foot             Gram Stain (Final)      Few Epithelial Cells      Many WBC's Seen      Moderate Gram Positive Cocci       ISO #1 (Final)      Moderate Growth      Streptococcus agalactiae (Group B)      If Nafcillin sensitive, Cefazolin  sensitive.                               ISO #1                            Streptococcus                            agalactiae (Group B)                            ---------------------    MIC (mcg/ml)      Clindamycin (CD)      >=1              R      Erythromycin (E)      >=8              R      Penicillin (P)        <=0.06           S      Vancomycin (V)        0.5              S         VH CULTURE, ANAEROBIC   VH CULTURE AND SMEAR, BONE       EKG: Reviewed by me  None    Imaging Studies: Reviewed by me  MRI FOOT LEFT W WO CONTRAST   Clinical History:  50 year old with diabetic foot ulcer. Evaluate osteomyelitis.    Examination:  Emergent MRI left foot with and without contrast March 10, 2015.    Technique:  Emergent precontrast MRI of the left lower extremity was performed with attention to the forefoot. Sagittal T1-weighted  and STIR; coronal proton density-weighted, fat-suppressed T2-weighted, and fat-suppressed T1-weighted; and axial  fat-suppressed T2-weighted images were obtained. Following intravenous administration of 10 mL Gadavist gadolinium  contrast, three-plane fat-suppressed T1-weighted images were obtained.     Comparison:  Prior MRI December 27, 2014. Correlation with radiographs, most recent March 10, 2015.    Findings:  Precontrast images show a defect of the skin and subcutaneous soft tissues at the medial aspect of the first MTP joint.  Depth is estimated at 5 mm. There is subcutaneous soft tissue edema, with proximal and central extension.    Post-contrast images show moderate soft tissue enhancement, and enhancement outlining the skin defect and a collection  encompassing the abductor hallucis tendon, 39 mm craniocaudal x 12 mm transverse x 21 mm AP. There is proximal extension  into the terminal muscle fibers.    There has been progressive erosion of the medial cortex of the head of the first metatarsal, with marrow edema and  enhancement in the head, neck, and shaft. There is synovial  enhancement at the first MTP joint, and a moderate joint  effusion. There is subchondral edema at the base of the first proximal phalanx, and mild diffuse marrow enhancement.  Chronic fracture deformity of the head is unchanged. There is a small effusion at the interphalangeal joint. The distal  phalanx is spared.    There is a small amount of fluid in the intermetatarsal bursae. There are chronic denervation changes of the midfoot and  forefoot musculature. Subcutaneous edema extends into the midfoot.    IMPRESSION:   Soft tissue ulcer medial aspect left first MTP joint, accompanied by peripherally enhancing fluid collection along the  abductor hallucis, synovitis, joint effusion, and osteomyelitis with progressive erosion of the metatarsal head.    XR FOOT LEFT AP LATERAL AND OBLIQUE   Clinical History:  Postop left foot    Examination:  AP, lateral and oblique views of the left foot.    Comparison:  March 10, 2015    Findings:  The patient has had an osteotomy mid 1st metatarsal with bony replacement. Bones and hardware in good position. Moderate  soft tissue swelling.    IMPRESSION:   Satisfactory postop left 1st metatarsal.      Assessment and Plan:    1. Diabetic foot ulcer, abscess and osteomyelitis of left hallux MTP s/p I&D  Postsurgical management as per podiatry change  Continue with IV antibiotics  Follow-up on bone cultures  Optimize pain control    2. IDDM Type 2 with diabetic peripheral neuropathy  Insulin for glycemic during admission; long-acting, pre-meal and sliding scale  Hold outpatient metformin  Check hemoglobin A1c  Consistent carbohydrate diet  Regular Accu-Cheks    3. Essential HTN  Controlled on admission  Resume outpatient lisinopril  Additional blood pressure control with IV hydralazine as needed and admission  Optimize pain control  Heart healthy diet    4. Dyslipidemia  Resume outpatient Lipitor  Heart healthy diet    5. Obesity class II  BMI of 36.3  Weight loss counseling  done  Inpatient dietary management    Plan of care discussed with patient and is in agreement as outlined, with verbalization of understanding and agreement      GI Prophylaxis: Famotidine    DVT Prophylaxis: Lovenox    CODE Status : FULL CODE    Nicola Police, MD  03/12/2015    7:55 PM      Note: This chart was generated by the Epic EMR system/speech recognition and may contain inherent errors or omissions not intended by the user. Grammatical errors, random word insertions, deletions, pronoun errors and incomplete sentences are  occasional consequences of this technology due to software limitations. Not all errors are caught or corrected. If there are questions or concerns about the content of this note or information contained within the body of this dictation they should be addressed directly with the author for clarification

## 2015-03-12 NOTE — Op Note (Signed)
Date: 03/12/2015  SURGEON: Janora Norlander, DPM    ASSISTANT: Erin Hearing, DPM (RES)    DIAGNOSES:  1.  Chronic diabetic foot ulceration, left foot.  2.  Osteomyelitis, left foot.    PROCEDURE:  1.  Irrigation and debridement of left foot.  2.  Application of antibiotic spacer.  3.  Resection of left first metatarsal head.    ANESTHESIA: Monitored anesthesia care.    ESTIMATED BLOOD LOSS: Minimal.    HEMOSTASIS:  1.  Direct mechanical pressure.  2.  Electrocautery.  3.  Anatomical dissection.    IMPLANTS: Depuy, Bone cement gentamicin spacer.    MATERIALS: 3-0 Prolene.    INJECTABLES: Preoperative block with 20 mL of lidocaine 1% plain  and Marcaine 0.5% plain, 1:1 mix.    SPECIMENS: Resected bone of the left first metatarsal head sent  to Pathology and Microbiology for further analysis.    ANTIBIOTICS: None, as patient received antibiotics on floors.    DRAINS: None.    COMPLICATIONS: None.    FINDINGS: Osteolytic changes noted to the left first metatarsal  head with purulence noted consistent with osteomyelitis.    CONDITION: Stable.    INDICATION: [name] is a 50 year old male with a  past medical history significant for diabetes mellitus with  neuropathy, hypertension, admitted to [facility] on  March 10, 2015, secondary to a draining left foot wound.    The patient relates that the wound has been present to the  medial aspect of the left first metatarsal joint since November  2016.  He was last admitted to the [facility] in  November of last year secondary to a nonhealing diabetic foot  ulcer and was discharged on a course of IV Ancef for a period  of 28 days; however, he relates that the wound initially  improved but gradually worsened leading to increased drainage,  redness, swelling, and purulence.  Recent MRI taken at the  [facility] demonstrated enhancing fluid  collection along the adductor hallucis with synovitis, joint  fusion, and  osteomyelitis noted to the left first metatarsal  with progressive erosion of the first metatarsal head.  Hence,  it was explained to the patient that an emergent irrigation and  debridement would be indicated at this time.      The patient has elected to proceed with surgical correction.  All alternatives,  risks, and complications of the procedure was thoroughly  explained to the patient.  Patient exhibited appropriate  understanding of all discussion points and informed consent  signed and obtained in chart with no guarantees of surgical  outcome, given or implied.  It was explained to the patient that  if the surgical site presented as not healing with continued  infection, a more proximal amputation would be indicated.    DESCRIPTION OF PROCEDURE: Patient was brought back to the OR and  placed on the operating room table in the supine position.  Patient was secured to table with safety belt, contralateral  SCDs placed, and all bony prominences were well padded.  Following surgical time-out, where the surgical site and all  members of the operating room were identified, monitored  anesthesia care occurred.  Next local anesthesia was then given  in a local infiltrative block fashion as described above.  No  tourniquet was utilized during this procedure,  The left foot  was then prepped and draped in the usual sterile manner and the  following procedure then began.  Attention was directed to the left foot, where a full-thickness  ulceration was noted to the medial aspect of the left first  metatarsal joint, probed to bone, with purulence expressed when  manually decompressed.    A marking pen was used to delineate a linear incision on the  dorsal aspect of the left first metatarsal, which was  approximately 5 cm in length running parallel and medial to the  extensor hallucis longus tendon.  Next, a #15 blade was used to  commence dissection through skin and subcutaneous tissue  utilizing sharp dissection.  All  bleeders encountered were  cauterized for hemostasis control.  Next, incision was deepened  down to the level of subcutaneous tissue, and retractors were  placed to protect all neurovascular structures.  Next, blunt  dissection commenced with Metzenbaum scissors exposing the  capsule and periosteum below.    A #15 blade was used to sharply dissect through the capsule and  periosteum down to level of bone, where the capsule and  periosteum were reflected off the bone medially and laterally  exposing the head of the left first metatarsal as well as the  base of the proximal phalanx of the hallux.  On inspection, the  bone of the first metatarsal was noted to be of poor quality of  Stock, with significant osteolytic changes and purulence noted  medially.    The head of the first metatarsal was resected with a sagittal  saw and was removed in toto from the operative site and sent to  Pathology and Microbiology for further analysis.  At this time,  bone cement impregnated with gentamicin was fashioned,  replicating the shape of the resected first metatarsal, which  was approximately 2 cm in length.  All devitalized tissue noted  were debrided until healthy bleeding tissue was appreciated.  Surgical site was manually decompressed with no purulence  appreciated.    Following completion of procedure described, all operative areas  were then thoroughly irrigated with 3000 mL of sterile normal  saline in a pulse lavage fashion.    The newly fashioned bone cement impregnated with gentamicin was  placed into the surgical site.  Next, skin was then  reapproximated utilizing 3-0 Prolene in a simple interrupted  fashion.  The foot was then dressed with bacitracin along the  surgical site, Adaptic, sterile 4x4s, Kling, Kerlix, and Ace for  compression and avoidance of postoperative hematoma.    Patient tolerated both the procedure and anesthesia well with  vital signs stable throughout.  Patient was transferred from OR  to Recovery  under the discretion of Anesthesia with vital signs  stable and neurovascular status intact to the operative limb.  Patient will follow the protocol of rest, ice, and elevation and  will be nonweightbearing to the operative limb in a surgical  shoe.  Patient will be readmitted to floors under primary  service for continued medical management and IV antibiotics.  Patient will be followed during this entire hospitalization by  Dr. Evette Doffing.        QN:[id number]  DD: 03/12/2015 19:23:34  DT: 03/12/2015 21:26:17  JOB: [id number]

## 2015-03-12 NOTE — Transfer of Care (Signed)
Anesthesia Transfer of Care Note    Patient: [name]    Last vitals:   Filed Vitals:    03/12/15 1850   BP: 134/73   Pulse: 92   Temp: 36.7 C (98.1 F)   Resp: 12   SpO2: 98%       Oxygen: Room Air     Mental Status:awake    Airway: Natural    Cardiovascular Status:  stable

## 2015-03-12 NOTE — Progress Notes (Signed)
PODIATRIC SURGERY PROGRESS NOTE   Resident pager [phone]    Date Time: 03/12/2015 8:15 AM  Patient Name: [name] Day: 3    Assessment:   [name] is a 50 y.o. male with a PMHx of DM with neuropathy, HTN, admitted to [facility] on 03/10/2015 for a draining left foot wound, concerning for cellulitis and osteomyelitis.    Afebrile    MRI (1/25) - osteomyelitis with progressive erosion of the 1st metatarsal head.  X-ray (1/25) - progressive erosion of the 1st metatarsal head favoring osteomyelitis.     Wound cx (1/25) - Streptococcus agalactiae    Plan:   - Medical and pain management per primary service   - Continue broad spectrum antibiotics for empiric coverage  - Appreciate pre-operative clearance and risk stratification per Medicine  - Will take to OR today for I&D of left foot at 5:15PM  - Please maintain NPO  - NWB to forefoot, heel touch permitted for transfers  - Will discuss plan with attending, continue to follow.    Subjective:   [name] was seen resting comfortably in bed.  Reports headaches overnight which have since improved this AM.  Denies any nausea, fever, chills, vomiting, diarrhea, shortness of breath, chest pain, calf pain/tenderness.  Patient is aware of surgical plans.      -All questions/concerns were addressed  -Patient is aware of treatment course/plan.      Physical Exam:     Temp:  [98.4 F (36.9 C)-99.4 F (37.4 C)] 98.4 F (36.9 C)  Resp Rate:  [17-18] 17  BP: (106-140)/(60-75) 117/71 mmHg         No intake or output data in the 24 hours ending 03/12/15 0815   Gen: AAO x 3, NAD          Lower Extremity Exam  Vasc:   -DP/PT pulses palpable to b/l lower extremity.   -CFT <5 seconds to digits of b/l foot.     Neuro:   -Protective sensation diminished to pedal sites b/l.  -No motor deficits noted b/l.    Derm:   LLE: Erythematous dorsum of foot, full thickness ulcer to medial aspect of left 1st MTPJ measuring 1 cm in diameter, probes to bone.  Purulence expressed when manually decompressed, no mal odor noted.   RLE: No ulceration or lesions noted.    MSK:   -Patient can plantarflex and dorsiflex ankles b/l.  -Patient can wiggle toes b/l.        Labs:   Labs (last 72 hours):      Recent Labs  Lab 03/11/15  0511 03/10/15  1919   WBC 9.8 12.0*   HEMOGLOBIN 11.6* 13.3   HEMATOCRIT 34.3* 40.0   PLT CT 220 222            Recent Labs  Lab 03/11/15  0511 03/11/15  0037   SODIUM 136 135*   POTASSIUM 4.0 4.2   CHLORIDE 101 100   CO2 26.1 26.4   BUN 30* 34*   CREATININE 1.22 1.27   CALCIUM 8.3* 8.3*   ALBUMIN 2.1*  --    PROTEIN, TOTAL 6.1  --    BILIRUBIN, TOTAL 0.4  --    ALKALINE PHOSPHATASE 88  --    ALT 15  --    AST (SGOT) 14  --    GLUCOSE 246* 283*                 Microbiology:  Microbiology  Results     Procedure Component Value Units Date/Time    Culture Wound [id number] Collected:  03/10/15 2057    Specimen Information:  Wound from Left Foot Updated:  03/12/15 0655    Narrative:      Specimen/Source: Wound/Left Foot  Collected: 03/10/2015 20:57     Status: Final      Last Updated: 03/12/2015 06:55           (1) *Specimen Wound      *Source Detail Left Foot             Gram Stain (Final)      Few Epithelial Cells      Many WBC's Seen      Moderate Gram Positive Cocci       ISO #1 (Final)      Moderate Growth      Streptococcus agalactiae (Group B)      If Nafcillin sensitive, Cefazolin sensitive.                               ISO #1                            Streptococcus                            agalactiae (Group B)                            ---------------------    MIC (mcg/ml)      Clindamycin (CD)      >=1              R      Erythromycin (E)      >=8              R      Penicillin (P)        <=0.06           S      Vancomycin (V)        0.5              S                Radiology:   Results reviewed.  Xr Foot Left Ap Lateral And Oblique    03/10/2015  Recurrent soft tissue thickening and ulceration at the medial aspect of the left first MTP  joint, with progressive erosion of the metatarsal head favoring osteomyelitis in this context over gout. ReadingStation:[email or web address]    Mri Foot Left W Wo Contrast    03/10/2015  Soft tissue ulcer medial aspect left first MTP joint, accompanied by peripherally enhancing fluid collection along the abductor hallucis, synovitis, joint effusion, and osteomyelitis with progressive erosion of the metatarsal head.  ReadingStation:[email or web address]        Signed by: [facility], PGY- 1  Pager# [phone]

## 2015-03-12 NOTE — Progress Notes (Signed)
Pharmacy Vancomycin Dosing Consult Note  [name]    Assessment:   1. Day #2 Vancomycin  in a 49yoM for Osteomyelitis/diabetic foot ulcer. Patient with history of chronic ulcer to his left foot.  A few days ago, his wound became swollen, red and painful. PMH significant for neuropathy, and DM 2. No updated labs today, but labs from yesterday shows WBC and SCR WNL. Patient remains afebrile. Wound culture growing Streptococcus agalactiae.     Plan:   1. Continue Vancomycin 1250mg  IV q12h  2. Trough due 03/13/15@1400   3. Pharmacy will follow the patient's renal function, vancomycin levels, and dosing during the course of therapy. If you have any questions, please contact the pharmacist at [phone].     Indication: Osteomyelitis/Diabetic foot ulcer    Age: 50 y.o.  Height: 1.676 m (5\' 6" )  Weight:  101.9 kg (224 lb 10.4 oz)  IBW: 63.8kg  DW: 79 kg    CrCL:80 ml/min    Other Nephrotoxic Drugs: Zosyn    Drug Levels:   Trough level due      Pharmacokinetics:   t50: 10.3 hrs         Kel: 0.067 hr-1        Vd: 54.4 L     Expected Trough: 16.8 mg/L                Recent Labs  Lab 03/11/15  0511 03/11/15  0037 03/10/15  1919   CREATININE 1.22 1.27 1.50*   BUN 30* 34* 31*       Recent Labs  Lab 03/11/15  0511 03/10/15  1919   WBC 9.8 12.0*     Temp (24hrs), Avg:98.9 F (37.2 C), Min:98.4 F (36.9 C), Max:99.4 F (37.4 C)      Cultures:   Date Source Organism Sensitivities Resistance   1/24 Wound Streptococcus agalactiae

## 2015-03-13 LAB — CBC AND DIFFERENTIAL
Basophils %: 0.6 % (ref 0.0–3.0)
Basophils Absolute: 0.1 10*3/uL (ref 0.0–0.3)
Eosinophils %: 1.3 % (ref 0.0–7.0)
Eosinophils Absolute: 0.1 10*3/uL (ref 0.0–0.8)
Hematocrit: 34 % — ABNORMAL LOW (ref 39.0–52.5)
Hemoglobin: 11.5 gm/dL — ABNORMAL LOW (ref 13.0–17.5)
Lymphocytes Absolute: 1.4 10*3/uL (ref 0.6–5.1)
Lymphocytes: 13.8 % — ABNORMAL LOW (ref 15.0–46.0)
MCH: 31 pg (ref 28–35)
MCHC: 34 gm/dL (ref 32–36)
MCV: 92 fL (ref 80–100)
MPV: 9 fL (ref 6.0–10.0)
Monocytes Absolute: 0.8 10*3/uL (ref 0.1–1.7)
Monocytes: 7.8 % (ref 3.0–15.0)
Neutrophils %: 76.6 % (ref 42.0–78.0)
Neutrophils Absolute: 7.7 10*3/uL (ref 1.7–8.6)
PLT CT: 246 10*3/uL (ref 130–440)
RBC: 3.67 10*6/uL — ABNORMAL LOW (ref 4.00–5.70)
RDW: 11.9 % (ref 11.0–14.0)
WBC: 10 10*3/uL (ref 4.0–11.0)

## 2015-03-13 LAB — COMPREHENSIVE METABOLIC PANEL
ALT: 20 U/L (ref 0–55)
AST (SGOT): 16 U/L (ref 10–42)
Albumin/Globulin Ratio: 0.51 Ratio — ABNORMAL LOW (ref 0.70–1.50)
Albumin: 2 gm/dL — ABNORMAL LOW (ref 3.5–5.0)
Alkaline Phosphatase: 154 U/L — ABNORMAL HIGH (ref 40–145)
Anion Gap: 11.6 mMol/L (ref 7.0–18.0)
BUN / Creatinine Ratio: 19.8 Ratio (ref 10.0–30.0)
BUN: 21 mg/dL (ref 7–22)
Bilirubin, Total: 0.4 mg/dL (ref 0.1–1.2)
CO2: 23.7 mMol/L (ref 20.0–30.0)
Calcium: 8.4 mg/dL — ABNORMAL LOW (ref 8.5–10.5)
Chloride: 103 mMol/L (ref 98–110)
Creatinine: 1.06 mg/dL (ref 0.80–1.30)
EGFR: >60 mL/min/{1.73_m2}
Globulin: 3.9 gm/dL (ref 2.0–4.0)
Glucose: 264 mg/dL — ABNORMAL HIGH (ref 70–99)
Osmolality Calc: 280 mOsm/kg (ref 275–300)
Potassium: 4.3 mMol/L (ref 3.5–5.3)
Protein, Total: 5.9 gm/dL — ABNORMAL LOW (ref 6.0–8.3)
Sodium: 134 mMol/L — ABNORMAL LOW (ref 136–147)

## 2015-03-13 LAB — HEPATIC FUNCTION PANEL
ALT: 21 U/L (ref 0–55)
AST (SGOT): 11 U/L (ref 10–42)
Albumin/Globulin Ratio: 0.51 Ratio — ABNORMAL LOW (ref 0.70–1.50)
Albumin: 2 gm/dL — ABNORMAL LOW (ref 3.5–5.0)
Alkaline Phosphatase: 137 U/L (ref 40–145)
Bilirubin Direct: 0.2 mg/dL (ref 0.0–0.3)
Bilirubin, Total: 0.3 mg/dL (ref 0.1–1.2)
Globulin: 3.9 gm/dL (ref 2.0–4.0)
Protein, Total: 5.9 gm/dL — ABNORMAL LOW (ref 6.0–8.3)

## 2015-03-13 LAB — HEMOGLOBIN A1C: Hgb A1C, %: 9.3 %

## 2015-03-13 LAB — VH DEXTROSE STICK GLUCOSE
Glucose POCT: 241 mg/dL — ABNORMAL HIGH (ref 70–99)
Glucose POCT: 230 mg/dL — ABNORMAL HIGH (ref 70–99)
Glucose POCT: 204 mg/dL — ABNORMAL HIGH (ref 70–99)
Glucose POCT: 210 mg/dL — ABNORMAL HIGH (ref 70–99)

## 2015-03-13 LAB — VANCOMYCIN, TROUGH: Vancomycin Trough: 12.83 ug/mL (ref 10.00–20.00)

## 2015-03-13 LAB — APTT: aPTT: 32.3 s (ref 24.0–34.0)

## 2015-03-13 LAB — PT/INR
PT INR: 1 (ref 0.5–1.3)
PT: 10.9 s (ref 9.5–11.5)

## 2015-03-13 LAB — LACTIC ACID: Lactic Acid: 0.8 mMol/L (ref 0.5–2.1)

## 2015-03-13 MED ORDER — INSULIN GLARGINE 100 UNIT/ML SC SOPN
28.0000 [IU] | PEN_INJECTOR | Freq: Every morning | SUBCUTANEOUS | Status: DC
Start: 2015-03-14 — End: 2015-03-17
  Administered 2015-03-14 – 2015-03-17 (×4): 28 [IU] via SUBCUTANEOUS

## 2015-03-13 MED ORDER — VH VANCOMYCIN 1250 MG IN NS 250 ML (SIMPLE)
1250.0000 mg | Freq: Three times a day (TID) | Status: DC
Start: 2015-03-13 — End: 2015-03-14
  Administered 2015-03-13 – 2015-03-14 (×3): 1250 mg via INTRAVENOUS
  Filled 2015-03-13 (×4): qty 250

## 2015-03-13 MED ORDER — INSULIN ASPART 100 UNIT/ML SC SOPN
10.0000 [IU] | PEN_INJECTOR | Freq: Three times a day (TID) | SUBCUTANEOUS | Status: DC
Start: 2015-03-13 — End: 2015-03-17
  Administered 2015-03-13 – 2015-03-17 (×12): 10 [IU] via SUBCUTANEOUS

## 2015-03-13 NOTE — PT Eval Note (Signed)
VHS: [facility]  Department of Rehabilitation Services: [phone]  [name]    CSN: [id number]    4TH SURGICAL   480/480-A    Physical Therapy Evaluation    Time of treatment:  Time Calculation  PT Received On: 03/13/15  Start Time: 1054  Stop Time: 1117  Time Calculation (min): 23 min    Visit#: 1                                                                                 Precautions and Contraindications:   Weight Bearing: Left LE Non weight bearing for ambulation but able to perform heel weight bearing with ortho wedge shoe for transfers (podiatrist phoned by PT)  Falls  Ortho wedge shoe when out of bed    Clinical Presentation and Decision Making     PT Assessment:  [name] was admitted 03/10/2015 with s/p I&D of left foot, spacer put in and resection of left first metatarsal head due to 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        Due to the presence of several treatment options and 1-2 comorbidities or personal factors that affect performance, as well as patient's evolving clinical presentation with changing characteristics, minimal to moderate modifications of mobility and/or assistance were necessary to complete evaluation when examining total of 3 elements (includes body structures and functions, activity limitations and/or participation restriction) determines the degree of complexity for this patient is MODERATE    Rehabilitation Potential:Good 24 hour supervision recommended.    Discussed risk, benefits and Plan of Care with: Patient    DISCHARGE RECOMMENDATIONS   DME recommended for  Discharge:   Front wheeled walker  Wheelchair - manual    Discharge Recommendations:   Home with supervision       Development of Plan of Care:     Goals:    LTG (By D/C)  1. Pt will be able to correctly verbalize weight bearing precautions in prep for safe mobility at home  NEW  2. Pt will perform SPT with front wheeled walker and ortho wedge shoe left, while maintaining heel weight bearing modified independently in prep for safe transfers   NEW  3. Pt will ambulate 20' with front wheeled walker maintaining NWB status left LE with ortho wedge shoe donned in prep for safe household distance ambulation  NEW  4. Pt will self propel wheelchair 200 feet independently in prep for safe community distance mobility  NEW  5. Pt will independently don/doff ortho wedge shoe left LE in prep for safe mobililty  NEW    Treatment/interventions: Exercise, Gait training, Functional transfer training, Patient/caregiver training, Equipment eval/education    Treatment Frequency: 4-5x/wk    History:   History of Present Illness:    Medical Diagnosis: Cellulitis of left foot [L03.116]  Diabetic ulcer of left foot associated with type 2 diabetes  mellitus [E11.621, L97.529]  Osteomyelitis of left foot, unspecified chronicity [M86.9]    [name] is a 50 y.o. male admitted on 03/10/2015 with a PMHx of DM with neuropathy, HTN, who presents to [facility] on 03/10/2015 for a draining left foot wound.  The patient relates that the wound has been present to the medial aspect of the left 1st MTPJ since 12/2014 after injuring his foot in "steel toed boots."      Patient was last admitted to [facility] on 12/2014 secondary to a non-healing diabetic foot ulcer, where he was discharged on a course of IV Ancef for a period of 28 days.  Patient relates the wound improved, that over the past few days, he noticed increased drainage, redness and swelling.  He visited an Urgent Care facility where he was placed on PO antibiotics with no improvement,  hence he presented to the ED today.    He endorses fevers, chills but denies any nausea, shortness of breath, chest pains, diarrhea.      Last meal at 1PM today.      PROCEDURE:  1.  Irrigation and debridement of left foot.  2.  Application of antibiotic spacer.  3.  Resection of left first metatarsal head.        Patient Active Problem List   Diagnosis   . Type 2 diabetes mellitus with diabetic neuropathy   . Carpal tunnel syndrome of right wrist   . Nicotine abuse   . Diabetic ulcer of left foot associated with type 2 diabetes mellitus   . Acute renal failure   . Diabetes mellitus with foot ulcer   . HTN (hypertension)   . AKI (acute kidney injury)   . Hyperlipemia   . Diabetic foot ulcer        X-Rays/Tests/Labs:  Xr Foot Left Ap Lateral And Oblique    03/12/2015  Satisfactory postop left 1st metatarsal. ReadingStation:[email or web address]    Xr Foot Left Ap Lateral And Oblique    03/10/2015  Recurrent soft tissue thickening and ulceration at the medial aspect of the left first MTP joint, with progressive erosion of the metatarsal head favoring osteomyelitis in this context over gout. ReadingStation:[email or web address]    Mri Foot Left W Wo Contrast    03/10/2015  Soft tissue ulcer medial aspect left first MTP joint, accompanied by peripherally enhancing fluid collection along the abductor hallucis, synovitis, joint effusion, and osteomyelitis with progressive erosion of the metatarsal head.  ReadingStation:[email or web address]        Past Medical/Surgical History:  Past Medical History   Diagnosis Date   . Seasonal allergic rhinitis    . Diabetes mellitus    . Disc    . Hypertension    . Diabetic ulcer of left foot 12/26/14      Past Surgical History   Procedure Laterality Date   . Hand surgery           Social History:    Home Living Arrangements:  Living Arrangements: Spouse/significant other, and s/o's parents  Assistance Available: 24 hour supervision  Type of Home: House  Home Layout: Ramped entrance, Able to live on main level with bedroom and  bathroom, Basement    Prior Level of Function:  Community ambulation  Mobility:  Independent with  No assistive device    DME available at home:  None    Subjective   "I can't feel pain because of my neuropathy"  Patient is agreeable to participation in the therapy session.  Pain:  At Rest: 0/10  With Activity: 0/10  Location: N/A  Interventions: None required    Examination of Body Systems (Structures, Function, Activity and Participation)   Patient's medical condition is appropriate for Physical therapy intervention at this time    Observation of patient:  Patient is in bed with peripheral IV and continuous pulse oximeter in place.    Cognition:  Oriented to: Oriented x4  Command following: Follows ALL commands and directions without difficulty  Alertness/Arousal: Appropriate responses to stimuli   Attention Span:Appears intact    Vital Signs (Cardiovascular):  HR Supine: 84 bpm  SpO2 at rest: 97%  on room air    Edema: left foot  Skin Inspection: left foot bandaged     Balance:  Static Sitting:  Good  Static Standing:  Good Bil UE support with walker  Dynamic Standing:  Good Bil UE support with walker           Musculoskeletal Examination:            Range of motion:  Right LE: Grossly WFL  Left LE: Grossly WFL       Strength:  Right LE: Grossly WFL  Left LE: Grossly WFL    Tone:  Not applicable    Functional Mobility:    Bed Mobility:  Supine to Sit:   Independent.        Sit to Supine:   Independent.          Transfers:  Sit to Stand:  Minimal assist from low surface of chair for safety with Front wheeled walker.    Cues for hand placement.  Stand to Sit:  Minimal assist for safety.    Cues for hand placement.  Stand Pivot:   Minimal assist for walker management with Front wheeled walker.   cues for walker management and heel weight bearing through ortho wedge shoe    Locomotion:  LEVEL AMBULATION:      Distance: 15 feet   Assistance level:  CGA for safety  Device:  Front wheeled walker  Pattern:  3 point,  Decreased step length:  right, Decreased clearance:  left  Cues for maintaining NWB while hopping with walker    AM-PACT "6 Clicks" Basic Mobility Inpatient Short Form  Turning Over in Bed: None  Sitting Down On/Standing From Armchair: A little  Lying on Back to Sitting on Side of Bed: None  Assist Moving to/from Bed to Chair: A little  Assist to Walk in Hospital Room: A little  Assist to Climb 3-5 Steps with Railing: A lot  PT Basic Mobility Raw Score: 19  CMS 0-100% Score: 41.77% Mobility G Code Set  Mobility, Current Status [id number]): At least 40 percent but less than 60 percent impaired, limited or restricted  Mobility, Goal Status [id number]): At least 1 percent but less than 20 percent impaired, limited or restricted  Tools used to determine level of impairment: AM-PACT "6 Clicks" Basic Mobility Score                 Participation and Activity Tolerance:  Participation effort: Good  Activity Tolerance: Tolerates 10-20 minutes of activity without rest breaks    Treatment Interventions this session:   Evaluation  Patient/family/caregiver education  Brace fit: Type: ortho wedge shoe.  Size: large  Equipment evaluation/education    Education Provided:   TOPICS: role of physical therapy, plan of care, goals of therapy and safety with mobility and ADLs, benefits of activity, weight bearing precautions, home safety, use  of adaptive equipment, activity with nursing    Learner educated: Patient  Method: Explanation  Response to education: Verbalized understanding    Patient Position at End of Treatment:   Supine, in bed, in the room, Needs in reach, Bed/chair alarm set and No distress    Team Communication:     Spoke to: RN/LPN Stephen Putt, MD- Podiatry  Regarding: Pre-session re: patient status, Patient position at end of session, Patient participation with Therapy, Further recommendations  Whiteboard updated: Yes  PT/PTA communication: via written note and verbal communication as needed.      Recommend patient SPT with close  supervision with ortho wedge shoe donned heel weight bearing, ambulate in room with walker maintaining non weight bearing outside of PT sessions.    Jillyn Hidden, PT, DPT

## 2015-03-13 NOTE — OT Eval Note (Addendum)
VHS: [facility]  Department of Rehabilitation Services: [phone]    [name]    CSN#: [id number]  4TH SURGICAL 480/480-A    Occupational Therapy Evaluation    Consult received for [name] for OT Evaluation and Treatment.  Patient's medical condition is appropriate for Occupational therapy intervention at this time.    Time of treatment:   Time Calculation  OT Received On: 03/13/15  Start Time: 1055  Stop Time: 1117  Time Calculation (min): 22 min    Visit#: 1    Precautions and Contraindications:   Falls  WB- L LE NWB for ambulation, able to perform heel weight bearing with ortho wedge shoe for transfers. PT obtained verbal order from Podiatrist.     OT Assessment/Clinical Decision Making:      [name] is a 50 y.o. male admitted 03/10/2015 presenting with wound infection s/p I&D L foot, application of antibiotic spacer and resection of L first metatarsal head.  Pt is I/S c ADLs and transfers at this time.. Patient is currently functioning at baseline. No further acute OT needs at this time. Will d/c from OT services.    Risks/benefits/POC discussed: Patient    Plan:   Treatment/interventions: ADL retraining, functional transfer training, patient/family training, equipment eval/education    Treatment Frequency: OT Frequency Recommended:  (No further OT needs at this time)    Goals:   Patient will be I/S c ADLs/transfers- MET     DISCHARGE RECOMMENDATIONS   DME recommended for Discharge:   none    Discharge Recommendations:         No OT needs    Medical & Therapy History:   Medical Diagnosis: Cellulitis of left foot [L03.116]  Diabetic ulcer of left foot associated with type 2 diabetes mellitus [E11.621, L97.529]  Osteomyelitis of left foot, unspecified chronicity [M86.9]    [name] is a 50 y.o. male admitted on 03/10/2015 with L foot infection s/p I&D, application of antibiotic spacer and resection of L first metatarsal head.    Discussed with  patient/family/caregiver the patient's physical, cognitive and/or psychosocial history related to current functional performance: yes    Previous therapy services: No    Patient Active Problem List   Diagnosis   . Type 2 diabetes mellitus with diabetic neuropathy   . Carpal tunnel syndrome of right wrist   . Nicotine abuse   . Diabetic ulcer of left foot associated with type 2 diabetes mellitus   . Acute renal failure   . Diabetes mellitus with foot ulcer   . HTN (hypertension)   . AKI (acute kidney injury)   . Hyperlipemia   . Diabetic foot ulcer        Past Medical/Surgical History:  Past Medical History   Diagnosis Date   . Seasonal allergic rhinitis    . Diabetes mellitus    . Disc    . Hypertension    . Diabetic ulcer of left foot 12/26/14      Past Surgical History   Procedure Laterality Date   . Hand surgery           Occupational Profile:   Home Living Arrangements  Living Arrangements: Family members  Assistance Available: Part time  Type of Home: House  Home Layout: Ramped entrance  Bathroom:  standard toilet;      DME Currently at Home: None    Prior Level of Function  Mobility: Community ambulation       Activities of  Daily Living  Feeding: Independent  Grooming: Independent  UB dressing: Independent  LB dressing: Independent  Bathing: Independent  Toileting: Independent    Instrumental Activities of Daily Living  Driving & community mobility: Driving: Independent    Work  Market researcher goal for OT: none    Patient/caregiver concerns & priorities: none    Subjective:   "This feels funny." pt stated re: wedge shoe    Patient is agreeable to participation in the therapy session.   O2 monitor on as well and in place after session.    Pain:  At Rest: 0 /10  With Activity: 0/10  Location: N/A  Interventions: None required    ASSESSMENT OF OCCUPATIONAL PERFORMANCE:   Observation of Patient:    Patient is in bed with peripheral IV in place.     Vital Signs:   Stable with no signs/symptoms of  distress     Oriented to: Oriented x4  Command following: Follows ALL commands and directions without difficulty  Alertness/Arousal: Appropriate responses to stimuli   Attention Span:Appears intact  Memory: Appears intact  Safety Awareness: Independent  Behavior: Cooperative    Musculoskeletal Examination:   Range of motion:  Right UE: Grossly WFL  Left UE: Grossly WFL       B UE strength grossly 4+/5 bilaterally    Vision wfls- wears glasses  Activities of Daily Living:   Eating:  Independent;   Grooming: Independent p setup  UB dressing:  Independent p setup  LB dressing: Independent  Donning/doffing shoes and socks R foot. Doffing ortho wedge shoe seatd EOB. Pt able to cross over legs to reach feet. Educated re: NWB precautions with ADLs.   Bathing:  Independent;- patient plans to sponge bathe. Pt has sponge bathed in the past if he could not get his foot wet.  Toileting:  Independent/S    AM-PACT "6 Clicks" Daily Activity Inpatient Short Form  Inpatient AM-PACT Performed?: yes  Put On/Take Off Lower Body Clothing: None  Assist with Bathing: None  Assist with Toileting: None  Put On/Take Off Upper Body Clothing: None  Assist with Grooming: None  Assist with Eating: None  OT Daily Activity Raw Score: 24  CMS 0-100% Score: 0.00%    Functional Mobility:   Bed Mobility:  Supine-sit, sit-supine independent    Transfers:  Sit-stand Supervision with walker, cues for hand placement  Supervision with transfer to chair using walker and cues for walker management. Pt needed cues for heel weight bearing. If patient wants to walk to the bathroom, he will will need to be NWB. Recommended a WC for distances. Pt c/o some shoulder pain with minimal activity using his arms to keep weight off L LE.       Participation and Activity Tolerance   Participation effort: Good  Activity Tolerance: Tolerates 10-20 minutes of activity without rest breaks    Other Treatment Interventions:   Treatment Activities:   Not applicable           Education Provided:   Topics: Role of occupational therapy, plan of care, goals of therapy and safety with mobility and ADLs, weight bearing precautions.    Individuals educated: Patient.  Method: Explanation.  Response to education: Verbalized understanding, demonstrated understanding    Team Communication:   OT communicated with: RN  OT communicated regarding: Pre-session re: patient status, Patient position at end of session  OT/COTA communication: via written note and verbal communication as needed.    Supine in  bed, Needs in reach and No distress    Recommend client to be up only with assist using walker/ortho wedge shoe outside of and in addition to OT session.  No further needs for OT. Emerson, OTR/L

## 2015-03-13 NOTE — Progress Notes (Signed)
[place] [address]     INITIAL ASSESSMENT  Case Management       Estimated D/C Date:     1/31   RX Coverage:       Yes- [employer]   Inpatient Plan of Care:      POD#1 I&D LT debridement, abx spacer, resection Lt first metatarsal head. CLT pending. IV abx. Febrile. Supportive care. PT/OT.    CM Interventions:      Chart reviewed. CM met with patient at bedside to discuss [place] planning. Patient reports plans to return home with SO.  He reports he has completed IV abx at home last year. He cannot recall the names of the agencies but was very dissatisfied with [facility]. CM reviewed admission in 12/2014. Patient was discharged home with [facility] and [facility]. CM will discuss HHA with patient once [place] plan is decided.  Patient will need FWW for [place].  CM following.       03/13/15 1526   Patient Type   Within 30 Days of Previous Admission? No   Medicare focused diagnosis patient? Not a Medicare focused diagnosis patient   Bundle patient? Not a bundle patient   Healthcare Decisions   Interviewed: Patient   Orientation/Decision Making Abilities of Patient Alert and Oriented x3, able to make decisions   Prior to admission   Prior level of function Independent with ADLs;Ambulates independently   Type of Residence Private residence   Living Arrangements Spouse/significant other   [place] None   Discharge [place] Spouse/significant other   Patient expects to be discharged to: home   Anticipated [place] plan discussed with: Same as interviewed   Mode of transportation: Private car (family member)   Consults/Providers   Correct PCP listed in [place]? Yes   Thurmon Fair, RN BSN  Nurse Case Manager  [phone]  Fx: [phone]

## 2015-03-13 NOTE — UM Notes (Signed)
[name]      Patient [name]  DOB March 08, 1965      BC ARK ID # [id number]    IP REF #    Please call Stephen Ahmadi, RN with any questions  Phone [phone]  Fax [phone]  Thank you          [facility] Utilization Management Review Sheet    NAME: [name]  MR#: [id number]    CSN#: [id number]    ROOM: 480/480-A AGE: 50 y.o.    ADMIT DATE AND TIME: 03/10/2015  6:52 PM      PATIENT CLASS:Inpatient status    ATTENDING PHYSICIAN: Goyal, Jani Gravel, MD  PAYOR:Payor: OUT OF STATE BLUE CROSS / Plan: [place] / Product Type: *No Product type* /       AUTH #:     DIAGNOSIS:     ICD-10-CM    1. Diabetic ulcer of left foot associated with type 2 diabetes mellitus E11.621     L97.529    2. Cellulitis of left foot L03.116    3. Osteomyelitis of left foot, unspecified chronicity M86.9        HISTORY:   Past Medical History   Diagnosis Date   . Seasonal allergic rhinitis    . Diabetes mellitus    . Disc    . Hypertension    . Diabetic ulcer of left foot 12/26/14       DATE OF REVIEW: 03/13/2015    VITALS: BP 132/75 mmHg  Pulse 89  Temp(Src) 98.6 F (37 C) (Oral)  Resp 16  Ht 1.676 m (5\' 6" )  Wt 101.9 kg (224 lb 10.4 oz)  BMI 36.28 kg/m2  SpO2 97%      UR NOTE:  ER admit  Changed to inpatient status by MD on 1/26      Per Podiatry MD OR note 1/26:  Date: 03/12/2015  SURGEON: Janora Norlander, DPM    ASSISTANT: Erin Hearing, DPM (RES)    DIAGNOSES:  1. Chronic diabetic foot ulceration, left foot.  2. Osteomyelitis, left foot.    PROCEDURE:  1. Irrigation and debridement of left foot.  2. Application of antibiotic spacer.  3. Resection of left first metatarsal head    FINDINGS: Osteolytic changes noted to the left first metatarsal  head with purulence noted consistent with osteomyelitis        Per MD note:  Assessment and Plan:    1. Diabetic foot ulcer, abscess and osteomyelitis of left hallux MTP s/p I&D  Postsurgical management as per podiatry  change  Continue with IV antibiotics  Follow-up on bone cultures  Optimize pain control        VS: temp 102.1      LABS:  Blood cultures pending  Wound cultures pending    MEDS:  IV Zosyn Q6H  IV Vanco Q8H      Meets inpatient criteria under M-600

## 2015-03-13 NOTE — Progress Notes (Signed)
Pharmacy Vancomycin Dosing Consult Note  [name]    Assessment:   1. Day #3 Vancomycin  in a 49yoM for Osteomyelitis/diabetic foot ulcer. Patient also on Zosyn. Patient remains afebrile, SCr has improved, WBCs are WNL. Wound culture growing Streptococcus agalactiae, bone culture growing GPC. Trough today was 12.83. This is subtherapeutic for osteomyelitis.     Plan:   1. Change vancomycin from 1250mg  IV every 8 hours to vancomycin 1250 mg IV every 8 hours.  2. Trough due 03/15/15@0700   3. Pharmacy will follow the patient's renal function, vancomycin levels, and dosing during the course of therapy. If you have any questions, please contact the pharmacist at [phone].     Indication: Osteomyelitis/Diabetic foot ulcer    Age: 50 y.o.  Height: 1.676 m (5\' 6" )  Weight:  101.9 kg (224 lb 10.4 oz)  IBW: 63.8kg  DW: 79 kg    CrCL:92 ml/min    Other Nephrotoxic Drugs: Zosyn    Drug Levels:   Trough: 12.83 on 03/13/2015 at 1405     New Pharmacokinetics:   t50: 6  hrs         Kel: 0.119 hr-1        Vd: 55.3 L     Expected Trough: 14.2 mg/L                Recent Labs  Lab 03/13/15  0529 03/11/15  0511 03/11/15  0037 03/10/15  1919   CREATININE 1.06 1.22 1.27 1.50*   BUN 21 30* 34* 31*       Recent Labs  Lab 03/13/15  0529 03/11/15  0511 03/10/15  1919   WBC 10.0 9.8 12.0*     Temp (24hrs), Avg:98.8 F (37.1 C), Min:97.5 F (36.4 C), Max:102.1 F (38.9 C)      Cultures:   Date Source Organism Sensitivities Resistance   03/10/2015 Wound Streptococcus agalactiae     03/13/2015 blood In progress     03/12/2015 bone Gram positive cocci

## 2015-03-13 NOTE — Plan of Care (Signed)
Problem: Safety  Goal: Patient will be free from injury during hospitalization  Pt resting in bed, call bell in place.    Problem: Pain  Goal: Patient's pain/discomfort is manageable  No c/o this am.    Problem: Psychosocial and Spiritual Needs  Goal: Demonstrates ability to cope with hospitalization/illness  Pt looking forward to getting boot today.    Problem: Potential for Compromised Skin Integrity  Goal: Skin integrity is maintained or improved  Assess and monitor skin integrity. Identify patients at risk for skin breakdown on admission and per policy. Collaborate with interdisciplinary team and initiate plans and interventions as needed.   Intervention: Turn patient  Pt turns self.    Goal: Nutritional status is improving  Monitor and assess patient for malnutrition (ex- brittle hair, bruises, dry skin, pale skin and conjunctiva, muscle wasting, smooth red tongue, and disorientation). Collaborate with interdisciplinary team and initiate plan and interventions as ordered. Monitor patient's weight and dietary intake as ordered or per policy. Utilize nutrition screening tool and intervene per policy. Determine patient's food preferences and provide high-protein, high-caloric foods as appropriate.   Intervention: Allow adequate time for meals  Pt tol consist ant carb diet.

## 2015-03-13 NOTE — Progress Notes (Addendum)
Date Time: 03/13/2015 1:40 PM  Patient Name: [name], [name]       CSN: [id number]  Attending Physician: Charmian Muff, MD  Primary Care Provider: Rhunette Croft, MD    Interim summary [facility] course)   50 year old male with past medical history about his modest type II, uncontrolled with hypoglycemia and with diabetic peripheral neuropathy, hypertension, hyperlipidemia was admitted to [facility] observation unit for diabetic foot ulcer with cellulitis and abscess. Patient was confirmed to have osteomyelitis of left hallux on MRI. Patient was started on broad-spectrum antibiotics. Podiatry service was consulted and patient underwent successful incision and drainage of left foot ulcer. Patient was transferred to medicine service for further management.    SUBJECTIVE:     CC: Follow up of uncontrolled diabetes mellitus type 2 with hyperglycemia, infected left diabetic foot ulcer status post incision and drainage, streptococcal acute osteomyelitis left hallux with SIRS and GPC bacteremia, history of hypertension and hyperlipidemia    Patient spiked fever this morning along with chills. Pain diabetic foot ulcer controlled. No chest pain or shortness of breath no dizziness.    PHYSICAL EXAM:   Temp:  [97.5 F (36.4 C)-102.1 F (38.9 C)] 98.6 F (37 C)  Heart Rate:  [89-108] 89  Resp Rate:  [12-18] 16  BP: (99-149)/(54-84) 132/75 mmHg  Body mass index is 36.28 kg/(m^2).    Intake/Output Summary (Last 24 hours) at 03/13/15 1340  Last data filed at 03/13/15 [id number]   Gross per 24 hour   Intake    500 ml   Output   1310 ml   Net   -810 ml     Weight Monitoring 12/25/2014 12/26/2014 12/26/2014 02/10/2015 03/10/2015 03/11/2015 03/12/2015   Height 167.6 cm 167.6 cm 167.6 cm 165.1 cm - 167.6 cm 167.6 cm   Height Method - Stated Stated - - Stated -   Weight 94.53 kg 94.348 kg 92.715 kg 100.88 kg 101.9 kg - 101.9 kg   Weight Method - Stated Actual - Actual - -   BMI  (calculated) 33.7 kg/m2 33.6 kg/m2 33.1 kg/m2 37.1 kg/m2 - - 36.3 kg/m2         Exam:   General: Obese, no acute distress   Psychiatry: Patient is awake, alert and oriented x 3, mood is appropriate   Chest: CTA bilaterally. No crackles or wheezing. No accessory muscle use   CVS: S1, S2 normal, RRR, no thrill   Abdomen: soft and non tender with good bowel sounds.   Musculoskeletal: No pitting edema, no cyanosis/clubbing. ACE bandage left foot   MEDS: (SCHEDULED/INFUSIONS/PRN)     Current Facility-Administered Medications   Medication Dose Route Frequency   . aspirin  81 mg Oral Daily   . atorvastatin  10 mg Oral QHS   . enoxaparin  40 mg Subcutaneous Q24H   . famotidine  20 mg Oral Daily   . gabapentin  300 mg Oral TID   . insulin aspart  7 Units Subcutaneous TID AC   . insulin glargine  25 Units Subcutaneous QAM   . lactobacillus species  50 Billion CFU Oral Daily   . lisinopril  20 mg Oral QHS   . piperacillin-tazobactam  3.375 g Intravenous Q6H   . vancomycin  1,250 mg Intravenous Q12H   . vancomycin therapy placeholder   Does not apply See Admin Instructions     Current Facility-Administered Medications   Medication Dose Route Frequency Last Rate   . lactated ringers   Intravenous Continuous 50 mL/hr at  03/12/15 2101     Current Facility-Administered Medications   Medication Dose Route   . acetaminophen  650 mg Oral    Or   . acetaminophen  650 mg Per NG tube    Or   . acetaminophen  650 mg Rectal   . acetaminophen  650 mg Oral    Or   . acetaminophen  650 mg Per NG tube    Or   . acetaminophen  650 mg Rectal   . cyclobenzaprine  10 mg Oral   . dextrose  25 mL Intravenous   . dextrose  25 mL Intravenous   . hydrALAZINE  10 mg Intravenous   . HYDROmorphone  0.5 mg Intravenous   . insulin aspart  1-4 Units Subcutaneous   . insulin aspart  2-12 Units Subcutaneous   . naloxone  0.4 mg Intravenous   . ondansetron  4 mg Oral    Or   . ondansetron  4 mg Intravenous   . oxyCODONE-acetaminophen  1-2 tablet Oral                  LABS:     Recent Labs  Lab 03/13/15  0529 03/11/15  0511   WBC 10.0 9.8   RBC 3.67* 3.69*   HEMOGLOBIN 11.5* 11.6*   HEMATOCRIT 34.0* 34.3*   MCV 92 93   PLT CT 246 220         Recent Labs  Lab 03/13/15  0529 03/11/15  0511   SODIUM 134* 136   POTASSIUM 4.3 4.0   CHLORIDE 103 101   CO2 23.7 26.1   BUN 21 30*   CREATININE 1.06 1.22   GLUCOSE 264* 246*   EGFR >60 >60   CALCIUM 8.4* 8.3*               Recent Labs  Lab 03/13/15  0529 03/11/15  0511   ALT 21  20 15    AST (SGOT) 11  16 14    BILIRUBIN, TOTAL 0.3  0.4 0.4   BILIRUBIN, DIRECT 0.2  --    ALBUMIN 2.0*  2.0* 2.1*   ALKALINE PHOSPHATASE 137  154* 88               Recent Labs  Lab 03/13/15  0700   PT INR 1.0        Lab Results   Component Value Date    HGBA1CPERCNT 9.3 03/13/2015         IMAGING STUDIES:   Xr Foot Left Ap Lateral And Oblique    03/12/2015  Satisfactory postop left 1st metatarsal. ReadingStation:[email or web address]    Xr Foot Left Ap Lateral And Oblique    03/10/2015  Recurrent soft tissue thickening and ulceration at the medial aspect of the left first MTP joint, with progressive erosion of the metatarsal head favoring osteomyelitis in this context over gout. ReadingStation:[email or web address]    Mri Foot Left W Wo Contrast    03/10/2015  Soft tissue ulcer medial aspect left first MTP joint, accompanied by peripherally enhancing fluid collection along the abductor hallucis, synovitis, joint effusion, and osteomyelitis with progressive erosion of the metatarsal head.  ReadingStation:[email or web address]      MICROBIOLOGY AND/OR TELEMETRY:   Recent bone culture: GPC  Wound culture:Streptococus  Blood culture: In progress    ASSESSMENT AND PLAN:   1. 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 SIRS +. Continue with empirical IV vancomycin and Zosyn. Follow-up CBC, BMP and vancomycin trough. Continue with current  pain control. Continue with local wound care. Podiatry service following. Follow-up final blood culture results. Patient might  need echocardiogram to evaluate for any infective endocarditis.  2. History of diabetes mellitus type 2. Uncontrolled with hyperglycemia. Increase Lantus. Increase pre-meal NovoLog. Continue with insulin sliding scale. Follow-up fingersticks. Continue diabetic diet.  3. History of hypertension and hyperlipidemia. Controlled. Continue with current management.        DVT Prophylaxis: Lovenox     Code Status: Full code    Disposition: Medically not ready for discharge, will stay in hospital pending clinical improvement      Plan discussed with patient, nursing staff.    Signed by: Charmian Muff, MD  [address]  [place]  Office Ph: [phone]    Please contact at phone number [phone] if any questions.          Note: This chart was generated by the Epic EMR system/speech recognition and may contain inherent errors or omissions not intended by the user. Grammatical errors, random word insertions, deletions, pronoun errors and incomplete sentences are occasional consequences of this technology due to software limitations. Not all errors are caught or corrected. If there are questions or concerns about the content of this note or information contained within the body of this dictation they should be addressed directly with the author for clarification

## 2015-03-13 NOTE — Consults (Signed)
Nutrition Note:    Consult forwarded to diabetes educator.    RD to follow per Nutrition Therapy policy.

## 2015-03-13 NOTE — Progress Notes (Signed)
[facility] RAPID RESPONSE SEPSIS     TURON NORTHCRAFT is a  50 y.o.  male admitted 03/10/2015 with a diabetic foot ulcer .  The Rapid Response Team was activated  on 03/13/2015 for a BPA sepsis screen. Vital signs are:   Temperature 102.1 F (38.9 C) (Oral), heart rate  108, blood pressure 121/54, respirations 18, pulse ox 96% on room air.         The Rapid Response Team was initiated to see your patient on 03/13/2015.  Based on the clinical situation, the Sepsis protocol was initiated.         SEPSIS SCREENING:  Systemic Inflammatory Response Syndrome (select 2 or more to continue screening)   Hyperthermia (Temperature > 38.3C)  Yes   Hypothermia (Temperature < 36 C)  No   Pulse > 90   Yes   Respirations > 20  No   WBC > 12   or    WBC < 4    or   WBC wnl with > 10% bands  9.8 on 03/11/15 no results in past 24 hours    Sepsis Criteria (1 or more to continue screening)  ASSESS INFECTION RISK/SCREENING:   Known or suspected infection documented in physician progress notes?   No   Is the patient receiving intravenous antibiotic, antifungal, or other anti-infective therapy at any point in the past 24 hours?  Yes   Has the patient had any invasive procedure in the past 30 days?   Yes    Sepsis Evaluation   Lactic acid  Yes   Labs (BMP, LFTs, CBC w/diff, PT/INR & PTT)   Yes   Blood cultures:  Peripheral Stick -  1 stick    Urinalysis  Yes    Rapid Response evaluation triggered by a computer generated [place] for Sepsis screening.  Patient has had a positive sepsis screen. Labs have been ordered per protocol.  Will follow up with results.    Ferne Coe RN  Rapid Response Team

## 2015-03-13 NOTE — Consults (Signed)
Consult received regarding A1c of 9.3% up from 8.0% 30 days before (03-13-15/02-10-15).  Patient states he has had diabetes more than 15 years.  He used to take 28 units of long acting insulin in AM and 25 in PM before 7 units of Novolog was added to his medication list.  He was taking 20 units of levemir before admission but we have raised it up to 30 units patient says.  He also asks why he cannot have a sliding scale at home to take before meals when he tests his BG and it is high.  He states he often see's 200-300mg /dl BG readings pre-meal and he takes the prescribed doses and again see's 200-300 pre-next meal.  He would like to be able to improve BG with added insulin.  I taught him safety in knowing he is eating a minimum amount of carbohydrates (45-60 grams) at the meal.  He has begun taking his novolog with him so he can take the dose if he eats out.  I put a sticky note asking inpatient medical staff to consider discharging patient on sliding scale insulin and encouraged him to ask his outpatient Dr to allow him to continue that at home.  His BG has been running higher with the foot infection.  Foot care rules reviewed today.  Steel toe shoes which on his previous job were required are no longer a factor as they have moved him to a job he can wear tennis shoes.  Encouraged patient to consider outpatient diabetes education which his outpatient Dr would order.  Discussed we do have a program at his local hospital [facility].  Brochure provided for this education.  Education booklet with plate method also provided and reviewed.  Discussed max carbs per meal.  Patient was unaware milk was a carb.  Encouraged him to review book and labels of items he is eating.  Taught him 15 grams of total carb would be 1 carb serving.  Eating 3-4 servings would be the most he should eat.

## 2015-03-13 NOTE — Plan of Care (Signed)
Problem: Safety  Goal: Patient will be free from injury during hospitalization  Pt resting in bed, calls for help when getting out of bed.    Problem: Pain  Goal: Patient's pain/discomfort is manageable  Pain is controlled with IV medication.    Problem: Potential for Compromised Skin Integrity  Goal: Skin integrity is maintained or improved  Assess and monitor skin integrity. Identify patients at risk for skin breakdown on admission and per policy. Collaborate with interdisciplinary team and initiate plans and interventions as needed.   Intervention: Turn patient  Pt turns self.    Goal: Nutritional status is improving  Monitor and assess patient for malnutrition (ex- brittle hair, bruises, dry skin, pale skin and conjunctiva, muscle wasting, smooth red tongue, and disorientation). Collaborate with interdisciplinary team and initiate plan and interventions as ordered. Monitor patient's weight and dietary intake as ordered or per policy. Utilize nutrition screening tool and intervene per policy. Determine patient's food preferences and provide high-protein, high-caloric foods as appropriate.   Intervention: Allow adequate time for meals  Pt tol cc diet.      Problem: Tissue integrity  Goal: Damaged tissue is healing and protected  Pt's foot completely covered with ace wrap.

## 2015-03-13 NOTE — Plan of Care (Signed)
Assumed care of patient from PACU; patient alert and oriented, resting in bed.  Patient oriented to room and unit. Left foot wrapped in gauze, kling, kerlex, and Ace wrap, and elevated 30 degrees.  Dressings c/d/I.  Patient voiding appropriate amount, IVF infusing.  No c/o n/v/pain.  Will continue to monitor.

## 2015-03-13 NOTE — Progress Notes (Signed)
[facility] RAPID RESPONSE SEPSIS     Severe Sepsis:     SBP < 90 (or 40 below baseline) or MAP<65  No   Bilirubin >2  No   Platelet count < 100,000 No   INR > 1.5 w/o anticoagulation  No     APTT > 60 sec w/o anticoagulation  No    Creatinine > 2 (not chronic)  No   New NIV or MV No    Antibiotic administration started:     Vancomycin  Yes      Piperacillin/tazobactam (Zosyn)  Yes    Lactic acid 0.8.  Negative for severe sepsis.              Brunetta Jeans, RN  Rapid Response Team Nurse

## 2015-03-13 NOTE — Progress Notes (Signed)
PODIATRIC SURGERY PROGRESS NOTE   Resident pager [phone]    Date Time: 03/13/2015 2:56 PM  Patient Name: [name] Day: 4    Assessment:   [name] is a 50 y.o. male with a PMHx of DM with neuropathy, HTN, admitted to [facility] on 03/10/2015 for a draining left foot wound, concerning for cellulitis and osteomyelitis.    POD#1: S/p I&D of left foot, resection of 1st met head, application of antibiotic spacer.      Wound cx (1/25) - Streptococcus agalactiae  Blood cx (1/27) - pending  Bone cx (03/13/15) - gram stain; GPC    Plan:   - Medical and pain management per primary service   - Continue broad spectrum antibiotics for empiric coverage  - Maintain surgical dressings C/D/I  - NWB to forefoot, heel touch permitted for transfers in surgical shoe  - PT recommends home with supervision; FWW; Wheelchair  - Will discuss plan with attending, continue to follow.    Subjective:   [name] was seen resting comfortably in bed.  Endorses fever/chills overnight but denies any vomiting, diarrhea, shortness of breath, chest pain, calf pain/tenderness.      Rapid response team called to evaluate for severe sepsis this AM, found to be negative.    -All questions/concerns were addressed  -Patient is aware of treatment course/plan.      Physical Exam:     Temp:  [97.5 F (36.4 C)-102.1 F (38.9 C)] 98.6 F (37 C)  Heart Rate:  [89-108] 89  Resp Rate:  [12-18] 16  BP: (99-149)/(54-84) 132/75 mmHg           Intake/Output Summary (Last 24 hours) at 03/13/15 1456  Last data filed at 03/13/15 0528   Gross per 24 hour   Intake    500 ml   Output   1310 ml   Net   -810 ml      Gen: AAO x 3, NAD          Lower Extremity Exam  Vasc:   -DP/PT pulses palpable to b/l lower extremity.   -CFT <5 seconds to digits of b/l foot.     Neuro:   -Protective sensation diminished to pedal sites b/l.  -No motor deficits noted b/l.    Derm:   LLE: Surgical dressing C/D/I.  No strikethrough noted.     RLE: No  ulceration or lesions noted.    MSK:   -Patient can plantarflex and dorsiflex ankles b/l.  -Patient can wiggle toes b/l.        Labs:   Labs (last 72 hours):      Recent Labs  Lab 03/13/15  0529 03/11/15  0511   WBC 10.0 9.8   HEMOGLOBIN 11.5* 11.6*   HEMATOCRIT 34.0* 34.3*   PLT CT 246 220         Recent Labs  Lab 03/13/15  0700   PT 10.9   PT INR 1.0      Recent Labs  Lab 03/13/15  0529 03/11/15  0511   SODIUM 134* 136   POTASSIUM 4.3 4.0   CHLORIDE 103 101   CO2 23.7 26.1   BUN 21 30*   CREATININE 1.06 1.22   CALCIUM 8.4* 8.3*   ALBUMIN 2.0*  2.0* 2.1*   PROTEIN, TOTAL 5.9*  5.9* 6.1   BILIRUBIN, TOTAL 0.3  0.4 0.4   ALKALINE PHOSPHATASE 137  154* 88   ALT 21  20 15  AST (SGOT) 11  16 14    GLUCOSE 264* 246*                 Microbiology:  Microbiology Results     Procedure Component Value Units Date/Time    Culture Wound [id number] Collected:  03/10/15 2057    Specimen Information:  Wound from Left Foot Updated:  03/12/15 0655    Narrative:      Specimen/Source: Wound/Left Foot  Collected: 03/10/2015 20:57     Status: Final      Last Updated: 03/12/2015 06:55           (1) *Specimen Wound      *Source Detail Left Foot             Gram Stain (Final)      Few Epithelial Cells      Many WBC's Seen      Moderate Gram Positive Cocci       ISO #1 (Final)      Moderate Growth      Streptococcus agalactiae (Group B)      If Nafcillin sensitive, Cefazolin sensitive.                               ISO #1                            Streptococcus                            agalactiae (Group B)                            ---------------------    MIC (mcg/ml)      Clindamycin (CD)      >=1              R      Erythromycin (E)      >=8              R      Penicillin (P)        <=0.06           S      Vancomycin (V)        0.5              S                Radiology:   Results reviewed.  Xr Foot Left Ap Lateral And Oblique    03/12/2015  Satisfactory postop left 1st metatarsal. ReadingStation:[email or web address]    Xr Foot Left Ap  Lateral And Oblique    03/10/2015  Recurrent soft tissue thickening and ulceration at the medial aspect of the left first MTP joint, with progressive erosion of the metatarsal head favoring osteomyelitis in this context over gout. ReadingStation:[email or web address]    Mri Foot Left W Wo Contrast    03/10/2015  Soft tissue ulcer medial aspect left first MTP joint, accompanied by peripherally enhancing fluid collection along the abductor hallucis, synovitis, joint effusion, and osteomyelitis with progressive erosion of the metatarsal head.  ReadingStation:[email or web address]        Signed by: [facility], PGY- 1  Pager# [phone]

## 2015-03-13 NOTE — Progress Notes (Incomplete Revision)
[facility] RAPID RESPONSE SEPSIS     [name] is a  50 y.o.  male admitted 03/10/2015 with a diabetic foot ulcer .  The Rapid Response Team was activated  on 03/13/2015 for a BPA sepsis screen. Vital signs are:   Temperature 102.1 F (38.9 C) (Oral), heart rate  108, blood pressure 121/54, respirations 18, pulse ox 96% on room air.         The Rapid Response Team was initiated to see your patient on 03/13/2015.  Based on the clinical situation, the Sepsis protocol was initiated.         SEPSIS SCREENING:  Systemic Inflammatory Response Syndrome (select 2 or more to continue screening)   Hyperthermia (Temperature > 38.3C)  Yes   Hypothermia (Temperature < 36 C)  No   Pulse > 90   Yes   Respirations > 20  No   WBC > 12   or    WBC < 4    or   WBC wnl with > 10% bands  9.8 on 03/11/15 no results in past 24 hours    Sepsis Criteria (1 or more to continue screening)  ASSESS INFECTION RISK/SCREENING:   Known or suspected infection documented in physician progress notes?   No   Is the patient receiving intravenous antibiotic, antifungal, or other anti-infective therapy at any point in the past 24 hours?  Yes   Has the patient had any invasive procedure in the past 30 days?   Yes    Sepsis Evaluation   Lactic acid  Yes   Labs (BMP, LFTs, CBC w/diff, PT/INR & PTT)   Yes   Blood cultures:  Peripheral Stick -  1 stick    Urinalysis  Yes    Rapid Response evaluation triggered by a computer generated [place] for Sepsis screening.  Patient has had a positive sepsis screen. Labs have been ordered per protocol.  Will follow up with results.  MEWS=4    Mykel Sponaugle [employer] RN  Rapid Response Team

## 2015-03-14 LAB — CREATININE, SERUM
Creatinine: 1 mg/dL (ref 0.80–1.30)
EGFR: >60 mL/min/{1.73_m2}

## 2015-03-14 LAB — VH DEXTROSE STICK GLUCOSE
Glucose POCT: 139 mg/dL — ABNORMAL HIGH (ref 70–99)
Glucose POCT: 193 mg/dL — ABNORMAL HIGH (ref 70–99)
Glucose POCT: 210 mg/dL — ABNORMAL HIGH (ref 70–99)
Glucose POCT: 216 mg/dL — ABNORMAL HIGH (ref 70–99)

## 2015-03-14 MED ORDER — SODIUM CHLORIDE 0.9 % IJ SOLN
10.0000 mL | INTRAMUSCULAR | Status: DC | PRN
Start: 2015-03-14 — End: 2015-03-17

## 2015-03-14 MED ORDER — VANCOMYCIN HCL IN DEXTROSE 1-5 GM/200ML-% IV SOLN
1000.0000 mg | Freq: Three times a day (TID) | INTRAVENOUS | Status: DC
Start: 2015-03-14 — End: 2015-03-14

## 2015-03-14 MED ORDER — VH LIDOCAINE HCL (PF) 1% INJECTION IN PICC LINE KIT ONLY
1.0000 mL | Freq: Once | INTRAMUSCULAR | Status: AC
Start: 2015-03-14 — End: 2015-03-14
  Administered 2015-03-14: 1 mL via SUBCUTANEOUS

## 2015-03-14 MED ORDER — HEPARIN SOD (PORK) LOCK FLUSH 10 UNIT/ML IV SOLN
3.0000 mL | Freq: Every day | INTRAVENOUS | Status: DC
Start: 2015-03-14 — End: 2015-03-17
  Administered 2015-03-14 – 2015-03-17 (×4): 5 mL
  Filled 2015-03-14 (×4): qty 5

## 2015-03-14 MED ORDER — CEFAZOLIN SODIUM-DEXTROSE 2-3 GM-% IV SOLR
2.0000 g | Freq: Three times a day (TID) | INTRAVENOUS | Status: DC
Start: 2015-03-14 — End: 2015-03-17
  Administered 2015-03-14 – 2015-03-17 (×10): 2 g via INTRAVENOUS
  Filled 2015-03-14 (×10): qty 50

## 2015-03-14 MED ORDER — SODIUM CHLORIDE 0.9 % IJ SOLN
10.0000 mL | Freq: Every day | INTRAMUSCULAR | Status: DC
Start: 2015-03-14 — End: 2015-03-17
  Administered 2015-03-14 – 2015-03-17 (×4): 10 mL

## 2015-03-14 NOTE — Plan of Care (Signed)
Problem: Safety  Goal: Patient will be free from injury during hospitalization  Outcome: Progressing    Problem: Pain  Goal: Patient's pain/discomfort is manageable  Outcome: Progressing    Problem: Tissue integrity  Goal: Damaged tissue is healing and protected  Outcome: Progressing

## 2015-03-14 NOTE — Progress Notes (Addendum)
Pharmacy Vancomycin Dosing Consult Note  [name]    Assessment:   1. Day #4 Vancomycin  in a 49yoM for Osteomyelitis/diabetic foot ulcer. Patient also on Zosyn. POD#2 S/p I&D of left foot, resection of 1st met head, application of antibiotic spacer. Patient remains afebrile, SCr has improved, WBCs are WNL. Wound culture growing Streptococcus agalactiae, bone culture growing GPC. Dose increased yesterday for subtherapeutic trough.      Plan:   1. Dose increase yesterday slightly aggressive, will change to vancomycin from 1000mg  IV every 8 hours.  2. Trough due 03/15/15@1700   3. Pharmacy will follow the patient's renal function, vancomycin levels, and dosing during the course of therapy. If you have any questions, please contact the pharmacist at [phone].     Indication: Osteomyelitis/Diabetic foot ulcer    Age: 50 y.o.  Height: 1.676 m (5\' 6" )  Weight:  101.9 kg (224 lb 10.4 oz)  IBW: 63.8kg  DW: 79 kg    CrCL:92 ml/min    Other Nephrotoxic Drugs: Zosyn    Drug Levels:   Trough 1/29 @1700   Trough: 12.83 on 03/13/2015 at 1405     New Pharmacokinetics: Based on level   t50: 6.8 hrs         Kel: 0.102 hr-1        Vd: 55.3 L     Expected Trough: 14.4 mg/L                Recent Labs  Lab 03/14/15  0353 03/13/15  0529 03/11/15  0511 03/11/15  0037 03/10/15  1919   CREATININE 1.00 1.06 1.22 1.27 1.50*   BUN  --  21 30* 34* 31*       Recent Labs  Lab 03/13/15  0529 03/11/15  0511 03/10/15  1919   WBC 10.0 9.8 12.0*     Temp (24hrs), Avg:98.7 F (37.1 C), Min:98 F (36.7 C), Max:99.5 F (37.5 C)      Cultures:   Date Source Organism Sensitivities Resistance   03/10/2015 Wound Streptococcus agalactiae     03/13/2015 blood NGTD     03/12/2015 bone Gram positive cocci

## 2015-03-14 NOTE — Progress Notes (Signed)
INFECTIOUS DISEASE CONSULT NOTE    Date Time: 03/14/2015 1:40 PM  Patient Name: [name]  Attending Physician: Charmian Muff, MD    Reason for Consult: Diabetic foot infection    Subjective     CC: Diabetic foot ulcer  Principal Problem:    Diabetic foot ulcer      HPI/Subjective:   50 year old male originally seen in November of last year. As steel toed boot rubbed against the dorsum of his left foot. He developed acute osteomyelitis with MSSA. He was treated with 4 weeks with IV cefazolin. He was seen in follow-up in the office in the first week in December at that time his wound was healed except for perhaps a tiny pending. There is no cellulitis PICC line was removed and IV antibiotics stopped. MRI suggested osteomyelitis and distal first metatarsal metatarsal head and first proximal phalanx.  Patient presented with increased drainage and swelling of his foot at the original site. MRI showed progressive osteomyelitis involving in the MTP joint he went to the operating room and had resection of the left first metatarsal head. He was treated with prednisone vancomycin adipose. Temp spike up to 38.9 which is defervescing. He currently is asymptomatic feels well been treated with prednisone and vancomycin. Wound culture now shows group B strep      Review of Systems:   Patient feels back to his baseline. He has had no ascending cellulitis in the leg and no further fever chills or sweats. She been wearing tennis shoes and his job had changed so this does not appear to be a case of further irritation by his work boots. She had no prior problems with his PICC line. Currently is complaining of tenderness at the IV site in his left hand. 7 her nausea vomiting diarrhea no shortness of breath or chest pain.    Past Medical History:     Past Medical History   Diagnosis Date   . Seasonal allergic rhinitis    . Diabetes mellitus    . Disc    . Hypertension    . Diabetic ulcer of left foot 12/26/14       Past  Surgical History:     Past Surgical History   Procedure Laterality Date   . Hand surgery           Allergies:     Allergies   Allergen Reactions   . Flu Virus Vaccine Anaphylaxis       Social History:     Social History     Social History   . Marital Status: Single     Spouse Name: N/A   . Number of Children: N/A   . Years of Education: N/A     Occupational History   . Not on file.     Social History Main Topics   . Smoking status: Never Smoker    . Smokeless tobacco: Never Used   . Alcohol Use: No   . Drug Use: No   . Sexual Activity: Not on file     Other Topics Concern   . Not on file     Social History Narrative           Family History:     Family History   Problem Relation Age of Onset   . Diabetes Mother    . Depression Sister    . COPD Sister    . Hypertension Brother        Physical Exam:   Temp:  [  98 F (36.7 C)-99.5 F (37.5 C)] 98.9 F (37.2 C)  Heart Rate:  [66-95] 89  Resp Rate:  [16-20] 18  BP: (109-132)/(53-64) 122/64 mmHg    Wt Readings from Last 3 Encounters:   03/12/15 101.9 kg (224 lb 10.4 oz)   02/10/15 100.88 kg (222 lb 6.4 oz)   12/26/14 92.715 kg (204 lb 6.4 oz)       Intake/Output Summary (Last 24 hours) at 03/14/15 1340  Last data filed at 03/14/15 1221   Gross per 24 hour   Intake   1250 ml   Output   3800 ml   Net  -2550 ml       Is alert oriented eating lunch. Afebrile. HEENT shows poor dentition with several carious broken teeth no oral thrush conjunctiva clear cranial nerves II through XII appear grossly intact. Neck is supple there is no distinct adenopathy. Lung fields are clear no wheezing or rhonchi. Cardiac exam reveals a regular rhythm without a murmur. Abdomen is protuberant soft nontender on extremities there is no ascending cellulitis surgical dressing not disturbed. No right-sided ankle edema or foot ulcerations. There is no erythema around the IV in the dorsum of the left hand        Meds:     Current Facility-Administered Medications   Medication Dose Route Frequency   .  aspirin  81 mg Oral Daily   . atorvastatin  10 mg Oral QHS   . ceFAZolin  2 g Intravenous Q8H   . enoxaparin  40 mg Subcutaneous Q24H   . famotidine  20 mg Oral Daily   . gabapentin  300 mg Oral TID   . insulin aspart  10 Units Subcutaneous TID AC   . insulin glargine  28 Units Subcutaneous QAM   . lactobacillus species  50 Billion CFU Oral Daily   . lisinopril  20 mg Oral QHS     acetaminophen **OR** acetaminophen **OR** acetaminophen, acetaminophen **OR** acetaminophen **OR** acetaminophen, cyclobenzaprine, dextrose, dextrose, hydrALAZINE, HYDROmorphone, insulin aspart, insulin aspart, naloxone, ondansetron **OR** ondansetron, oxyCODONE-acetaminophen           Labs:     Labs (last 24 hours):  Results     Procedure Component Value Units Date/Time    Dextrose Stick Glucose [id number]  (Abnormal) Collected:  03/14/15 1139    Specimen Information:  Blood Updated:  03/14/15 1203     Glucose, POCT 193 (H) mg/dL     Blood Culture - Venipuncture #1 [id number] Collected:  03/13/15 0700    Specimen Information:  Blood Updated:  03/14/15 1041    Narrative:      Specimen: Blood  Collected: 03/13/2015 07:00     Status: Valued      Last Updated: 03/14/2015 10:39           (1) *If not drawn in previous 24 hours      *Specimen Blood             Culture Result (Prelim)      No Growth To Date          Anaerobic Culture [id number] Collected:  03/12/15 1819    Specimen Information:  Bone from Bone Updated:  03/14/15 0828    Narrative:      Specimen/Source: Bone/Bone  Collected: 03/12/2015 18:19     Status: Valued      Last Updated: 03/14/2015 08:28                Culture Result (Prelim)  No Anaerobes Isolated - Day 2, Reincubate          Bone Culture and Smear [id number] Collected:  03/12/15 1819    Specimen Information:  Bone from Left Foot Updated:  03/14/15 0725    Narrative:      Specimen/Source: Bone/Left Foot  Collected: 03/12/2015 18:19     Status: Valued      Last Updated: 03/14/2015 07:25                Gram Stain  (Final)      Moderate WBC's Seen      Few Gram Positive Cocci       ISO #1 (Prelim)      Moderate Growth      Gram Positive Cocci      Results called and read back by (licensed clinician/date/time/tech):      alicia curry rn [id number] [id number]      Identification and Sensitivity To Follow          Dextrose Stick Glucose [id number]  (Abnormal) Collected:  03/14/15 0650    Specimen Information:  Blood Updated:  03/14/15 0711     Glucose, POCT 139 (H) mg/dL     Creatinine, Serum [id number] Collected:  03/14/15 0353    Specimen Information:  Plasma Updated:  03/14/15 0523     Creatinine 1.00 mg/dL      EGFR >60 mL/min/1.83m2     Dextrose Stick Glucose [id number]  (Abnormal) Collected:  03/13/15 2017    Specimen Information:  Blood Updated:  03/13/15 2034     Glucose, POCT 210 (H) mg/dL     Dextrose Stick Glucose [id number]  (Abnormal) Collected:  03/13/15 1651    Specimen Information:  Blood Updated:  03/13/15 1707     Glucose, POCT 204 (H) mg/dL     Vancomycin, Trough [id number] Collected:  03/13/15 1405    Specimen Information:  Plasma Updated:  03/13/15 1517     Vancomycin Trough 12.83 mcg/mL     Dextrose Stick Glucose [id number]  (Abnormal) Collected:  03/13/15 1125    Specimen Information:  Blood Updated:  03/13/15 1141     Glucose, POCT 230 (H) mg/dL     Hepatic function panel (LFT) [id number]  (Abnormal) Collected:  03/13/15 0529    Specimen Information:  Plasma Updated:  03/13/15 0748     Protein, Total 5.9 (L) gm/dL      Albumin 2.0 (L) gm/dL      Alkaline Phosphatase 137 U/L      ALT 21 U/L      AST (SGOT) 11 U/L      Bilirubin, Total 0.3 mg/dL      Bilirubin, Direct 0.2 mg/dL      Albumin/Globulin Ratio 0.51 (L) Ratio      Globulin 3.9 gm/dL     Prothrombin time/INR [id number] Collected:  03/13/15 0700    Specimen Information:  Blood Updated:  03/13/15 0744     PT 10.9 sec      PT INR 1.0     APTT [id number] Collected:  03/13/15 0700    Specimen Information:  Blood Updated:  03/13/15 0744     aPTT 32.3  sec     Dextrose Stick Glucose [id number]  (Abnormal) Collected:  03/13/15 0720    Specimen Information:  Blood Updated:  03/13/15 0737     Glucose, POCT 241 (H) mg/dL     Lactic acid, plasma [id number] Collected:  03/13/15 0700    Specimen Information:  Blood Updated:  03/13/15  [id number]     Lactic acid 0.8 mMol/L     Comprehensive metabolic panel [id number]  (Abnormal) Collected:  03/13/15 0529    Specimen Information:  Plasma Updated:  03/13/15 0714     Sodium 134 (L) mMol/L      Potassium 4.3 mMol/L      Chloride 103 mMol/L      CO2 23.7 mMol/L      Calcium 8.4 (L) mg/dL      Glucose 264 (H) mg/dL      Creatinine 1.06 mg/dL      BUN 21 mg/dL      Protein, Total 5.9 (L) gm/dL      Albumin 2.0 (L) gm/dL      Alkaline Phosphatase 154 (H) U/L      ALT 20 U/L      AST (SGOT) 16 U/L      Bilirubin, Total 0.4 mg/dL      Albumin/Globulin Ratio 0.51 (L) Ratio      Anion Gap 11.6 mMol/L      BUN/Creatinine Ratio 19.8 Ratio      EGFR >60 mL/min/1.37m2      Osmolality Calc 280 mOsm/kg      Globulin 3.9 gm/dL     Hemoglobin A1C [id number] Collected:  03/13/15 0529    Specimen Information:  Blood Updated:  03/13/15 0710     Hgb A1C, % 9.3 %     CBC with differential [id number]  (Abnormal) Collected:  03/13/15 0529    Specimen Information:  Blood from Blood Updated:  03/13/15 0702     WBC 10.0 K/cmm      RBC 3.67 (L) M/cmm      Hemoglobin 11.5 (L) gm/dL      Hematocrit 34.0 (L) %      MCV 92 fL      MCH 31 pg      MCHC 34 gm/dL      RDW 11.9 %      PLT CT 246 K/cmm      MPV 9.0 fL      NEUTROPHIL % 76.6 %      Lymphocytes 13.8 (L) %      Monocytes 7.8 %      Eosinophils % 1.3 %      Basophils % 0.6 %      Neutrophils Absolute 7.7 K/cmm      Lymphocytes Absolute 1.4 K/cmm      Monocytes Absolute 0.8 K/cmm      Eosinophils Absolute 0.1 K/cmm      BASO Absolute 0.1 K/cmm     Dextrose Stick Glucose [id number]  (Abnormal) Collected:  03/12/15 1910    Specimen Information:  Blood Updated:  03/12/15 1937     Glucose, POCT 159 (H)  mg/dL     Dextrose Stick Glucose [id number]  (Abnormal) Collected:  03/12/15 1656    Specimen Information:  Blood Updated:  03/12/15 1716     Glucose, POCT 166 (H) mg/dL     Dextrose Stick Glucose [id number]  (Abnormal) Collected:  03/12/15 0657    Specimen Information:  Blood Updated:  03/12/15 0714     Glucose, POCT 161 (H) mg/dL     Culture Wound [id number] Collected:  03/10/15 2057    Specimen Information:  Wound from Left Foot Updated:  03/12/15 0655    Narrative:      Specimen/Source: Wound/Left Foot  Collected: 03/10/2015 20:57     Status: Final      Last Updated: 03/12/2015 06:55           (  1) *Specimen Wound      *Source Detail Left Foot             Gram Stain (Final)      Few Epithelial Cells      Many WBC's Seen      Moderate Gram Positive Cocci       ISO #1 (Final)      Moderate Growth      Streptococcus agalactiae (Group B)      If Nafcillin sensitive, Cefazolin sensitive.                               ISO #1                            Streptococcus                            agalactiae (Group B)                            ---------------------    MIC (mcg/ml)      Clindamycin (CD)      >=1              R      Erythromycin (E)      >=8              R      Penicillin (P)        <=0.06           S      Vancomycin (V)        0.5              S          Dextrose Stick Glucose [id number]  (Abnormal) Collected:  03/11/15 1958    Specimen Information:  Blood Updated:  03/11/15 2020     Glucose, POCT 218 (H) mg/dL     Dextrose Stick Glucose [id number]  (Abnormal) Collected:  03/11/15 1707    Specimen Information:  Blood Updated:  03/11/15 1724     Glucose, POCT 201 (H) mg/dL     Dextrose Stick Glucose [id number]  (Abnormal) Collected:  03/11/15 1217    Specimen Information:  Blood Updated:  03/11/15 1235     Glucose, POCT 259 (H) mg/dL     Dextrose Stick Glucose [id number]  (Abnormal) Collected:  03/11/15 0816    Specimen Information:  Blood Updated:  03/11/15 0832     Glucose, POCT 201 (H) mg/dL      Comprehensive metabolic panel [id number]  (Abnormal) Collected:  03/11/15 0511    Specimen Information:  Plasma Updated:  03/11/15 0558     Sodium 136 mMol/L      Potassium 4.0 mMol/L      Chloride 101 mMol/L      CO2 26.1 mMol/L      Calcium 8.3 (L) mg/dL      Glucose 246 (H) mg/dL      Creatinine 1.22 mg/dL      BUN 30 (H) mg/dL      Protein, Total 6.1 gm/dL      Albumin 2.1 (L) gm/dL      Alkaline Phosphatase 88 U/L      ALT 15 U/L      AST (SGOT) 14 U/L  Bilirubin, Total 0.4 mg/dL      Albumin/Globulin Ratio 0.53 (L) Ratio      Anion Gap 12.9 mMol/L      BUN/Creatinine Ratio 24.6 Ratio      EGFR >60 mL/min/1.12m2      Osmolality Calc 286 mOsm/kg      Globulin 4.0 gm/dL     CBC with differential [id number]  (Abnormal) Collected:  03/11/15 0511    Specimen Information:  Blood from Blood Updated:  03/11/15 0556     WBC 9.8 K/cmm      RBC 3.69 (L) M/cmm      Hemoglobin 11.6 (L) gm/dL      Hematocrit 34.3 (L) %      MCV 93 fL      MCH 31 pg      MCHC 34 gm/dL      RDW 12.2 %      PLT CT 220 K/cmm      MPV 8.9 fL      NEUTROPHIL % 71.5 %      Lymphocytes 18.7 %      Monocytes 7.4 %      Eosinophils % 1.8 %      Basophils % 0.6 %      Neutrophils Absolute 7.0 K/cmm      Lymphocytes Absolute 1.8 K/cmm      Monocytes Absolute 0.7 K/cmm      Eosinophils Absolute 0.2 K/cmm      BASO Absolute 0.1 K/cmm     Basic Metabolic Panel [id number]  (Abnormal) Collected:  03/11/15 0037    Specimen Information:  Plasma Updated:  03/11/15 0105     Sodium 135 (L) mMol/L      Potassium 4.2 mMol/L      Chloride 100 mMol/L      CO2 26.4 mMol/L      Calcium 8.3 (L) mg/dL      Glucose 283 (H) mg/dL      Creatinine 1.27 mg/dL      BUN 34 (H) mg/dL      Anion Gap 12.8 mMol/L      BUN/Creatinine Ratio 26.8 Ratio      EGFR 60 mL/min/1.30m2      Osmolality Calc 288 mOsm/kg     Narrative:      This order is a replacement of the rejected order with accession number [id number].    Dextrose Stick Glucose [id number]  (Abnormal) Collected:   03/10/15 2211    Specimen Information:  Blood Updated:  03/10/15 2228     Glucose, POCT 336 (H) mg/dL     ESR [id number]  (Abnormal) Collected:  03/10/15 1919    Specimen Information:  Blood Updated:  03/10/15 2143     Sed Rate 74 (H) mm/hr     CRP [[id number]]  (Abnormal) Collected:  03/10/15 1919    Specimen Information:  Plasma Updated:  03/10/15 2102     C-Reactive Protein 17.07 (H) mg/dL     Basic Metabolic Panel [id number]  (Abnormal) Collected:  03/10/15 1919    Specimen Information:  Plasma Updated:  03/10/15 1953     Sodium 136 mMol/L      Potassium 4.7 mMol/L      Chloride 99 mMol/L      CO2 26.3 mMol/L      Calcium 9.4 mg/dL      Glucose 435 (H) mg/dL      Creatinine 1.50 (H) mg/dL      BUN 31 (H) mg/dL      Anion  Gap 15.4 mMol/L      BUN/Creatinine Ratio 20.7 Ratio      EGFR 49 mL/min/1.22m2      Osmolality Calc 297 mOsm/kg     CBC [id number]  (Abnormal) Collected:  03/10/15 1919    Specimen Information:  Blood from Blood Updated:  03/10/15 1944     WBC 12.0 (H) K/cmm      RBC 4.26 M/cmm      Hemoglobin 13.3 gm/dL      Hematocrit 40.0 %      MCV 94 fL      MCH 31 pg      MCHC 33 gm/dL      RDW 12.2 %      PLT CT 222 K/cmm      MPV 9.2 fL      NEUTROPHIL % 79.8 (H) %      Lymphocytes 12.3 (L) %      Monocytes 6.1 %      Eosinophils % 1.5 %      Basophils % 0.3 %      Neutrophils Absolute 9.5 (H) K/cmm      Lymphocytes Absolute 1.5 K/cmm      Monocytes Absolute 0.7 K/cmm      Eosinophils Absolute 0.2 K/cmm      BASO Absolute 0.0 K/cmm           Imaging, reviewed and are significant for:  XR FOOT LEFT AP LATERAL AND OBLIQUE   Final Result   Satisfactory postop left 1st metatarsal.      ReadingStation:[email or web address]      MRI FOOT LEFT W WO CONTRAST   Final Result   Soft tissue ulcer medial aspect left first MTP joint, accompanied by peripherally enhancing fluid collection along the   abductor hallucis, synovitis, joint effusion, and osteomyelitis with progressive erosion of the metatarsal head.           ReadingStation:[email or web address]      XR FOOT LEFT AP LATERAL AND OBLIQUE   Final Result   Recurrent soft tissue thickening and ulceration at the medial aspect of the left first MTP joint, with progressive   erosion of the metatarsal head favoring osteomyelitis in this context over gout.      ReadingStation:[email or web address]      Echo Doppler Waveform Complete    (Results Pending)         Microbiology, reviewed and are significant for:  Microbiology Results     Procedure Component Value Units Date/Time    Anaerobic Culture [id number] Collected:  03/12/15 1819    Specimen Information:  Bone from Bone Updated:  03/14/15 0828    Narrative:      Specimen/Source: Bone/Bone  Collected: 03/12/2015 18:19     Status: Valued      Last Updated: 03/14/2015 08:28                Culture Result (Prelim)      No Anaerobes Isolated - Day 2, Reincubate          Blood Culture - Venipuncture #1 [id number] Collected:  03/13/15 0700    Specimen Information:  Blood Updated:  03/14/15 1041    Narrative:      Specimen: Blood  Collected: 03/13/2015 07:00     Status: Valued      Last Updated: 03/14/2015 10:39           (1) *If not drawn in previous 24 hours      *Specimen Blood  Culture Result (Prelim)      No Growth To Date          Bone Culture and Smear [id number] Collected:  03/12/15 1819    Specimen Information:  Bone from Left Foot Updated:  03/14/15 0725    Narrative:      Specimen/Source: Bone/Left Foot  Collected: 03/12/2015 18:19     Status: Valued      Last Updated: 03/14/2015 07:25                Gram Stain (Final)      Moderate WBC's Seen      Few Gram Positive Cocci       ISO #1 (Prelim)      Moderate Growth      Gram Positive Cocci      Results called and read back by (licensed clinician/date/time/tech):      alicia curry rn [phone] [id number]      Identification and Sensitivity To Follow          Culture Wound [id number] Collected:  03/10/15 2057    Specimen Information:  Wound from Left Foot Updated:  03/12/15 0655    Narrative:       Specimen/Source: Wound/Left Foot  Collected: 03/10/2015 20:57     Status: Final      Last Updated: 03/12/2015 06:55           (1) *Specimen Wound      *Source Detail Left Foot             Gram Stain (Final)      Few Epithelial Cells      Many WBC's Seen      Moderate Gram Positive Cocci       ISO #1 (Final)      Moderate Growth      Streptococcus agalactiae (Group B)      If Nafcillin sensitive, Cefazolin sensitive.                               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 Interestingly this is a new  organism. The original MSSA has not been recovered.      Plan:   We'll arrange for PICC line. Anticipate will need 6 weeks IV antibiotics. Discontinue Zosyn and vancomycin. Begin cefazolin 2 g IV every 8 hours    Signed by: Hardin Negus, MD

## 2015-03-14 NOTE — Progress Notes (Signed)
PROGRESS NOTE    Date Time: 03/14/2015 1:11 PM  Patient Name: [name],[name]      Subjective:   POD #2 s/p I&D with excision of 1st metatarsal head and implantation of abx impregnated en block cement.  He developed sepsis with fever night following surgery and states that he is feeling much better.  Denies pain.  Patient relates no other concerns.  Medications:     Current Facility-Administered Medications   Medication Dose Route Frequency   . aspirin  81 mg Oral Daily   . atorvastatin  10 mg Oral QHS   . ceFAZolin  2 g Intravenous Q8H   . enoxaparin  40 mg Subcutaneous Q24H   . famotidine  20 mg Oral Daily   . gabapentin  300 mg Oral TID   . insulin aspart  10 Units Subcutaneous TID AC   . insulin glargine  28 Units Subcutaneous QAM   . lactobacillus species  50 Billion CFU Oral Daily   . lisinopril  20 mg Oral QHS         Physical Exam:     Filed Vitals:    03/14/15 1306   BP: 122/64   Pulse: 89   Temp: 98.9 F (37.2 C)   Resp: 18   SpO2: 99%       Intake and Output Summary (Last 24 hours) at Date Time    Intake/Output Summary (Last 24 hours) at 03/14/15 1311  Last data filed at 03/14/15 1221   Gross per 24 hour   Intake   1250 ml   Output   3800 ml   Net  -2550 ml       General appearance - well appearing, and in no distress  Mental status - alert, oriented to person, place, and time, normal mood, behavior, speech, dress, motor activity, and thought processes  Neurological - alert, oriented, normal speech, no focal findings or movement disorder noted,     Dorsal incision is coapted with sutures.  There is decreased erythema.  Edema persists.  The medial wound is clean with clear drainage as expexted.  No purulence noted.      Labs:     Results     Procedure Component Value Units Date/Time    Dextrose Stick Glucose [id number]  (Abnormal) Collected:  03/14/15 1139    Specimen Information:  Blood Updated:  03/14/15 1203     Glucose, POCT 193 (H) mg/dL     Blood Culture - Venipuncture #1 [id number] Collected:   03/13/15 0700    Specimen Information:  Blood Updated:  03/14/15 1041    Narrative:      Specimen: Blood  Collected: 03/13/2015 07:00     Status: Valued      Last Updated: 03/14/2015 10:39           (1) *If not drawn in previous 24 hours      *Specimen Blood             Culture Result (Prelim)      No Growth To Date          Anaerobic Culture [id number] Collected:  03/12/15 1819    Specimen Information:  Bone from Bone Updated:  03/14/15 0828    Narrative:      Specimen/Source: Bone/Bone  Collected: 03/12/2015 18:19     Status: Valued      Last Updated: 03/14/2015 08:28                Culture Result (Prelim)  No Anaerobes Isolated - Day 2, Reincubate          Bone Culture and Smear [id number] Collected:  03/12/15 1819    Specimen Information:  Bone from Left Foot Updated:  03/14/15 0725    Narrative:      Specimen/Source: Bone/Left Foot  Collected: 03/12/2015 18:19     Status: Valued      Last Updated: 03/14/2015 07:25                Gram Stain (Final)      Moderate WBC's Seen      Few Gram Positive Cocci       ISO #1 (Prelim)      Moderate Growth      Gram Positive Cocci      Results called and read back by (licensed clinician/date/time/tech):      alicia curry rn [phone] [id number]      Identification and Sensitivity To Follow          Dextrose Stick Glucose [id number]  (Abnormal) Collected:  03/14/15 0650    Specimen Information:  Blood Updated:  03/14/15 0711     Glucose, POCT 139 (H) mg/dL     Creatinine, Serum [id number] Collected:  03/14/15 0353    Specimen Information:  Plasma Updated:  03/14/15 0523     Creatinine 1.00 mg/dL      EGFR >60 mL/min/1.110m2     Dextrose Stick Glucose [id number]  (Abnormal) Collected:  03/13/15 2017    Specimen Information:  Blood Updated:  03/13/15 2034     Glucose, POCT 210 (H) mg/dL     Dextrose Stick Glucose [id number]  (Abnormal) Collected:  03/13/15 1651    Specimen Information:  Blood Updated:  03/13/15 1707     Glucose, POCT 204 (H) mg/dL     Vancomycin, Trough  [id number] Collected:  03/13/15 1405    Specimen Information:  Plasma Updated:  03/13/15 1517     Vancomycin Trough 12.83 mcg/mL           Recent BMP   Recent Labs      03/14/15   0353   CREATININE  1.00     Recent CBC WITH DIFF No results for input(s): RBC, HGB, HCT, MCV, MCHC, RDW, MPV, LABPLAT in the last 24 hours.    Invalid input(s): WHITEBLOODCE, ADIFF, REFLX, CANCL, BAND, ABAND    Lab Results   Component Value Date    WBC 10.0 03/13/2015    HGB 11.5* 03/13/2015    PLT 246 03/13/2015    NA 134* 03/13/2015    K 4.3 03/13/2015    BUN 21 03/13/2015    CREAT 1.00 03/14/2015    AST 16 03/13/2015    AST 11 03/13/2015    ALB 2.0* 03/13/2015    ALB 2.0* 03/13/2015    INR 1.0 03/13/2015    LDL 91 10/28/2014       Rads:   Radiological Procedure reviewed.    Assessment:   S/p I and D left foot with excision of 1st metatarsal head and implantation of gentamycin impregntated en bloc cement.  Acute Osteomyelitis left foot first metatarsal.  Cellulitis left foot.  DM-2 uncontrolled    Plan:   Patient was seen, examined and informed of findings and treatment plan.  Dressing change today.  Await ID consult.  Long term plan as follows.  6 weeks IV abx and once infection resolves he will be taken back to surgery for definitive reconstruction to include fusion of 1st metatarsal  phalangeal joint with en bloc bone graft and external fixation..    At this point he will not require dressing changes on his own as I will follow him weekly in the office.    Hopeful d/c next Tuesday or Wednesday when OK with ID.        Signed by: Janora Norlander, DPM

## 2015-03-14 NOTE — Progress Notes (Signed)
Assumed care of patient; patient alert and oriented, resting in bed.  Patient voiding adequate amounts to urinal at bedside commode.   Uneventful shift, IVF infusing, call light in reach, will continue to monitor.

## 2015-03-14 NOTE — Consults (Signed)
Peripherally Inserted Central Catheter (PICC) PROCEDURE NOTE    [place] E  03/14/2015        CONSENT: The procedure, risks, benefits and alternatives were discussed with the patient.  All questions were answered, and informed written consent was obtained from the patient.  Informed consent and Procedure Boarding Pass were completed.     PROCEDURE DETAILS:      Ultrasound was used to confirm patency of the Right Basilic vein prior to obtaining venous access. The area to be punctured was  cleansed with chlorhexidine 2% for more than 30 seconds and the site draped using maximal sterile barrier precautions.    After 1% lidocaine injected followed by puncture of the vessel with a 21-gauge single-wall needle under direct sonographic guidance. Guidewire was advanced and the needle was removed and a peel-away sheath was placed. The catheter was then trimmed and advanced through the peel-away sheath using electronic navigation.  The sheath was removed, brisk blood return confirmed, the catheter was flushed with normal saline and capped.      Maximal sterile barrier was used: cap, mask, sterile gloves, sterile gown and body drape.  All guide wires were removed with tip intact by visual inspection.  The catheter was stabilized on the skin using a securement device.  Antimicrobial disc and sterile transparent occlusive dressing applied using sterile technique.     Patient did tolerate procedure well.        Catheter Type: Power picc 50f single lumen  Insertion Site (vein): Right Basilic  Total Length: 43 cm  Internal Length: 43 cm  External Length: 0 cm  UAC: 32 cm      PICC Kit Lot #: [id number]      FINDINGS/CONCLUSIONS:   No signs of bleeding or symptoms of related complications.    Final tip location was confirmed utilizing ECG tip confirmation.    PICC is ready for use  PICC education / instructions provided to patient.      Sue Lush, RN          Catheter Care    Dressing Changes  A Sterile dressing change is  indicated at least once every seven (7)  days or if otherwise indicated.    Assess the dressing more frequently in the first 24 hours for accumulation of blood, fluid, or moisture beneath the dressing.     Periodically confirm catheter placement, patency, and security of dressing.       Securement Device  Statlock Stabilization Device must remain in place for the duration of the catheter and to be replaced at least once every seven (7) days in accordance with a sterile dressing change, or if otherwise indicated.    Removable Caps  This institution uses removable caps on the tips of each lumen. Each cap must be changed at least once every seven (7) days in accordance with a sterile dressing change, or if otherwise indicated.     Flushing  Administer a brisk pulsatile flush using a 66mL syringe of 0.9% normal saline at least once every twelve (12) hours or before/after catheter use. In addition, administer Heparin (10unit/mL) flush 3-20mL syringe once every twenty-four (24) hours after normal saline flush.

## 2015-03-14 NOTE — Progress Notes (Signed)
Date Time: 03/14/2015 10:28 AM  Patient Name: [name], [name]       CSN: [id number]  Attending Physician: Charmian Muff, MD  Primary Care Provider: Rhunette Croft, MD    Interim summary [facility] course)   50 year old male with past medical history about his modest type II, uncontrolled with hypoglycemia and with diabetic peripheral neuropathy, hypertension, hyperlipidemia was admitted to [facility] observation unit for diabetic foot ulcer with cellulitis and abscess. Patient was confirmed to have osteomyelitis of left hallux on MRI. Patient was started on broad-spectrum antibiotics. Podiatry service was consulted and patient underwent successful incision and drainage of left foot ulcer. Patient was transferred to medicine service for further management.    SUBJECTIVE:     CC: Follow up of uncontrolled diabetes mellitus type 2 with hyperglycemia, infected left diabetic foot ulcer status post incision and drainage, streptococcal acute osteomyelitis left hallux with SIRS and GPC bacteremia, history of hypertension and hyperlipidemia    No more fever. Sugars better controlled. No chest pain no shortness of breath. No dysuria. No abdominal pain.    PHYSICAL EXAM:   Temp:  [98 F (36.7 C)-99.5 F (37.5 C)] 98.8 F (37.1 C)  Heart Rate:  [66-95] 87  Resp Rate:  [16-20] 18  BP: (109-132)/(53-75) 115/63 mmHg  Body mass index is 36.28 kg/(m^2).    Intake/Output Summary (Last 24 hours) at 03/14/15 1028  Last data filed at 03/14/15 0617   Gross per 24 hour   Intake   1250 ml   Output   3000 ml   Net  -1750 ml     Weight Monitoring 12/25/2014 12/26/2014 12/26/2014 02/10/2015 03/10/2015 03/11/2015 03/12/2015   Height 167.6 cm 167.6 cm 167.6 cm 165.1 cm - 167.6 cm 167.6 cm   Height Method - Stated Stated - - Stated -   Weight 94.53 kg 94.348 kg 92.715 kg 100.88 kg 101.9 kg - 101.9 kg   Weight Method - Stated Actual - Actual - -   BMI (calculated) 33.7 kg/m2 33.6 kg/m2 33.1  kg/m2 37.1 kg/m2 - - 36.3 kg/m2         Exam:   General: Obese, no acute distress   Psychiatry: Patient is awake, alert and oriented x 3, mood is appropriate   Chest: CTA bilaterally. No crackles or wheezing. No accessory muscle use   CVS: S1, S2 normal, RRR  Abdomen: soft and non tender with good bowel sounds.   Musculoskeletal: No pitting edema, no cyanosis/clubbing. ACE bandage left foot   MEDS: (SCHEDULED/INFUSIONS/PRN)     Current Facility-Administered Medications   Medication Dose Route Frequency   . aspirin  81 mg Oral Daily   . atorvastatin  10 mg Oral QHS   . enoxaparin  40 mg Subcutaneous Q24H   . famotidine  20 mg Oral Daily   . gabapentin  300 mg Oral TID   . insulin aspart  10 Units Subcutaneous TID AC   . insulin glargine  28 Units Subcutaneous QAM   . lactobacillus species  50 Billion CFU Oral Daily   . lisinopril  20 mg Oral QHS   . piperacillin-tazobactam  3.375 g Intravenous Q6H   . vancomycin  1,250 mg Intravenous Q8H   . vancomycin therapy placeholder   Does not apply See Admin Instructions     Current Facility-Administered Medications   Medication Dose Route Frequency Last Rate     Current Facility-Administered Medications   Medication Dose Route   . acetaminophen  650 mg Oral  Or   . acetaminophen  650 mg Per NG tube    Or   . acetaminophen  650 mg Rectal   . acetaminophen  650 mg Oral    Or   . acetaminophen  650 mg Per NG tube    Or   . acetaminophen  650 mg Rectal   . cyclobenzaprine  10 mg Oral   . dextrose  25 mL Intravenous   . dextrose  25 mL Intravenous   . hydrALAZINE  10 mg Intravenous   . HYDROmorphone  0.5 mg Intravenous   . insulin aspart  1-4 Units Subcutaneous   . insulin aspart  2-12 Units Subcutaneous   . naloxone  0.4 mg Intravenous   . ondansetron  4 mg Oral    Or   . ondansetron  4 mg Intravenous   . oxyCODONE-acetaminophen  1-2 tablet Oral         LABS:       Recent Labs  Lab 03/13/15  0529 03/11/15  0511   WBC 10.0 9.8   RBC 3.67* 3.69*   HEMOGLOBIN 11.5* 11.6*      HEMATOCRIT 34.0* 34.3*   MCV 92 93   PLT CT 246 220         Recent Labs  Lab 03/14/15  0353 03/13/15  0529 03/11/15  0511   SODIUM  --  134* 136   POTASSIUM  --  4.3 4.0   CHLORIDE  --  103 101   CO2  --  23.7 26.1   BUN  --  21 30*   CREATININE 1.00 1.06 1.22   GLUCOSE  --  264* 246*   EGFR >60 >60 >60   CALCIUM  --  8.4* 8.3*               Recent Labs  Lab 03/13/15  0529 03/11/15  0511   ALT 21  20 15    AST (SGOT) 11  16 14    BILIRUBIN, TOTAL 0.3  0.4 0.4   BILIRUBIN, DIRECT 0.2  --    ALBUMIN 2.0*  2.0* 2.1*   ALKALINE PHOSPHATASE 137  154* 88         Recent Labs  Lab 03/13/15  0700   PT INR 1.0        Lab Results   Component Value Date    HGBA1CPERCNT 9.3 03/13/2015         IMAGING STUDIES:   Xr Foot Left Ap Lateral And Oblique    03/12/2015  Satisfactory postop left 1st metatarsal. ReadingStation:[email or web address]    Xr Foot Left Ap Lateral And Oblique    03/10/2015  Recurrent soft tissue thickening and ulceration at the medial aspect of the left first MTP joint, with progressive erosion of the metatarsal head favoring osteomyelitis in this context over gout. ReadingStation:[email or web address]    Mri Foot Left W Wo Contrast    03/10/2015  Soft tissue ulcer medial aspect left first MTP joint, accompanied by peripherally enhancing fluid collection along the abductor hallucis, synovitis, joint effusion, and osteomyelitis with progressive erosion of the metatarsal head.  ReadingStation:[email or web address]      MICROBIOLOGY AND/OR TELEMETRY:   Recent Blood culture: in progress   Wound culture:Streptococus agalactie  Bone culture: GPC    ASSESSMENT AND PLAN:   1. 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 SIRS +. Continue with empirical IV vancomycin and Zosyn. Follow-up CBC, BMP and  Continue with current pain control. Continue with local wound care. Podiatry  service following. Follow-up final blood and bone culture results. Consulted ID ( Dr Luberta Robertson).   2. History of diabetes mellitus type 2. Better  controlled. Increase Lantus. Cont pre-meal NovoLog. Continue with insulin sliding scale. Follow-up fingersticks. Continue diabetic diet.  3. History of hypertension and hyperlipidemia. Controlled. Continue with current management.        DVT Prophylaxis: Lovenox     Code Status: Full code    Disposition: Medically not ready for discharge, will stay in hospital pending clinical improvement      Plan discussed with patient, nursing staff.    Signed by: Charmian Muff, MD  [address]  [place]  Office Ph: [phone]    Please contact at phone number [phone] if any questions.          Note: This chart was generated by the Epic EMR system/speech recognition and may contain inherent errors or omissions not intended by the user. Grammatical errors, random word insertions, deletions, pronoun errors and incomplete sentences are occasional consequences of this technology due to software limitations. Not all errors are caught or corrected. If there are questions or concerns about the content of this note or information contained within the body of this dictation they should be addressed directly with the author for clarification

## 2015-03-14 NOTE — Progress Notes (Signed)
Assumed care of patient at 0700. Patient is AOx4. Denies pain, n/v, sob at this time. Lungs are clear. Active bowel sounds. Left foot dressing in place. Neurovascular check WNL. No further needs at this time. Will continue to monitor.

## 2015-03-15 LAB — CREATININE, SERUM
Creatinine: 0.87 mg/dL (ref 0.80–1.30)
EGFR: >60 mL/min/{1.73_m2}

## 2015-03-15 LAB — VH DEXTROSE STICK GLUCOSE
Glucose POCT: 205 mg/dL — ABNORMAL HIGH (ref 70–99)
Glucose POCT: 222 mg/dL — ABNORMAL HIGH (ref 70–99)
Glucose POCT: 131 mg/dL — ABNORMAL HIGH (ref 70–99)

## 2015-03-15 MED ORDER — METFORMIN HCL 500 MG PO TABS
1000.0000 mg | ORAL_TABLET | Freq: Two times a day (BID) | ORAL | Status: DC
Start: 2015-03-15 — End: 2015-03-17
  Administered 2015-03-15 – 2015-03-17 (×5): 1000 mg via ORAL
  Filled 2015-03-15 (×6): qty 2

## 2015-03-15 NOTE — Progress Notes (Addendum)
Assumed care of patient; patient A/O x4, sitting upright for breakfast. No n/v/pain. Pt curious about not taking Metformin, No other needs at this time, call light and personal items in reach, will continue to monitor.     Heidi Dach, RN

## 2015-03-15 NOTE — Progress Notes (Signed)
Date Time: 03/15/2015 8:45 AM  Patient Name: [name], [name]       CSN: [id number]  Attending Physician: Charmian Muff, MD  Primary Care Provider: Rhunette Croft, MD    Interim summary [facility] course)   50 year old male with past medical history about his modest type II, uncontrolled with hypoglycemia and with diabetic peripheral neuropathy, hypertension, hyperlipidemia was admitted to [facility] observation unit for diabetic foot ulcer with cellulitis and abscess. Patient was confirmed to have osteomyelitis of left hallux on MRI. Patient was started on broad-spectrum antibiotics. Podiatry service was consulted and patient underwent successful incision and drainage of left foot ulcer. Patient was transferred to medicine service for further management. Bone and wound culture grew streptococcal agalactie. ID service was consulted and antibiotics were changed to iv cefazolin. PICC line was placed for 6 weeks of iv antibiotic course.     SUBJECTIVE:     CC: Follow up of uncontrolled diabetes mellitus type 2 with hyperglycemia, infected left diabetic foot ulcer status post incision and drainage, streptococcal acute osteomyelitis left hallux with SIRS and GPC bacteremia, history of hypertension and hyperlipidemia    No overnight events. BS again in 200's. No chest pain no fever. No diarrhea.     PHYSICAL EXAM:   Temp:  [98.4 F (36.9 C)-99.8 F (37.7 C)] 98.4 F (36.9 C)  Heart Rate:  [78-98] 78  Resp Rate:  [16-19] 16  BP: (122-144)/(64-90) 143/90 mmHg  Body mass index is 36.28 kg/(m^2).    Intake/Output Summary (Last 24 hours) at 03/15/15 0845  Last data filed at 03/14/15 1607   Gross per 24 hour   Intake    884 ml   Output    800 ml   Net     84 ml     Weight Monitoring 12/25/2014 12/26/2014 12/26/2014 02/10/2015 03/10/2015 03/11/2015 03/12/2015   Height 167.6 cm 167.6 cm 167.6 cm 165.1 cm - 167.6 cm 167.6 cm   Height Method - Stated Stated - - Stated -   Weight  94.53 kg 94.348 kg 92.715 kg 100.88 kg 101.9 kg - 101.9 kg   Weight Method - Stated Actual - Actual - -   BMI (calculated) 33.7 kg/m2 33.6 kg/m2 33.1 kg/m2 37.1 kg/m2 - - 36.3 kg/m2         Exam:   General: Obese, no acute distress   Psychiatry: Patient is awake, alert and oriented x 3, mood is appropriate   Chest: CTA bilaterally. No crackles or wheezing.   CVS: S1, S2 normal, RRR  Abdomen: soft and non tender with good bowel sounds.   Musculoskeletal: No pitting edema, no cyanosis/clubbing. ACE bandage left foot   MEDS: (SCHEDULED/INFUSIONS/PRN)     Current Facility-Administered Medications   Medication Dose Route Frequency   . aspirin  81 mg Oral Daily   . atorvastatin  10 mg Oral QHS   . ceFAZolin  2 g Intravenous Q8H   . enoxaparin  40 mg Subcutaneous Q24H   . famotidine  20 mg Oral Daily   . gabapentin  300 mg Oral TID   . heparin FLUSH  3-5 mL Intracatheter Daily   . insulin aspart  10 Units Subcutaneous TID AC   . insulin glargine  28 Units Subcutaneous QAM   . lactobacillus species  50 Billion CFU Oral Daily   . lisinopril  20 mg Oral QHS   . metFORMIN  1,000 mg Oral BID Meals   . sodium chloride (PF)  10 mL Intracatheter Daily  Current Facility-Administered Medications   Medication Dose Route Frequency Last Rate     Current Facility-Administered Medications   Medication Dose Route   . acetaminophen  650 mg Oral    Or   . acetaminophen  650 mg Per NG tube    Or   . acetaminophen  650 mg Rectal   . acetaminophen  650 mg Oral    Or   . acetaminophen  650 mg Per NG tube    Or   . acetaminophen  650 mg Rectal   . cyclobenzaprine  10 mg Oral   . dextrose  25 mL Intravenous   . dextrose  25 mL Intravenous   . hydrALAZINE  10 mg Intravenous   . HYDROmorphone  0.5 mg Intravenous   . insulin aspart  1-4 Units Subcutaneous   . insulin aspart  2-12 Units Subcutaneous   . naloxone  0.4 mg Intravenous   . ondansetron  4 mg Oral    Or   . ondansetron  4 mg Intravenous   . oxyCODONE-acetaminophen  1-2 tablet Oral   .  sodium chloride (PF)  10 mL Intracatheter         LABS:       Recent Labs  Lab 03/13/15  0529 03/11/15  0511   WBC 10.0 9.8   RBC 3.67* 3.69*   HEMOGLOBIN 11.5* 11.6*   HEMATOCRIT 34.0* 34.3*   MCV 92 93   PLT CT 246 220         Recent Labs  Lab 03/15/15  0526 03/14/15  0353 03/13/15  0529 03/11/15  0511   SODIUM  --   --  134* 136   POTASSIUM  --   --  4.3 4.0   CHLORIDE  --   --  103 101   CO2  --   --  23.7 26.1   BUN  --   --  21 30*   CREATININE 0.87 1.00 1.06 1.22   GLUCOSE  --   --  264* 246*   EGFR >60 >60 >60 >60   CALCIUM  --   --  8.4* 8.3*               Recent Labs  Lab 03/13/15  0529 03/11/15  0511   ALT 21  20 15    AST (SGOT) 11  16 14    BILIRUBIN, TOTAL 0.3  0.4 0.4   BILIRUBIN, DIRECT 0.2  --    ALBUMIN 2.0*  2.0* 2.1*   ALKALINE PHOSPHATASE 137  154* 88         Recent Labs  Lab 03/13/15  0700   PT INR 1.0        Lab Results   Component Value Date    HGBA1CPERCNT 9.3 03/13/2015         IMAGING STUDIES:   Xr Foot Left Ap Lateral And Oblique    03/12/2015  Satisfactory postop left 1st metatarsal. ReadingStation:[email or web address]    Xr Foot Left Ap Lateral And Oblique    03/10/2015  Recurrent soft tissue thickening and ulceration at the medial aspect of the left first MTP joint, with progressive erosion of the metatarsal head favoring osteomyelitis in this context over gout. ReadingStation:[email or web address]    Mri Foot Left W Wo Contrast    03/10/2015  Soft tissue ulcer medial aspect left first MTP joint, accompanied by peripherally enhancing fluid collection along the abductor hallucis, synovitis, joint effusion, and osteomyelitis with progressive erosion of the metatarsal head.  ReadingStation:[email or web address]  MICROBIOLOGY AND/OR TELEMETRY:   Recent Blood culture: No growth  Wound culture:Streptococus agalactie  Bone culture: Streptococus agalactie    ASSESSMENT AND PLAN:   1. 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 SIRS +. Continue with iv cefazolin per ID.Continue with current  pain control. Continue with local wound care. PICC already in place. Case management for discharge planning.   2. History of diabetes mellitus type 2. Uncontrolled. cont Lantus. Cont pre-meal NovoLog. Continue with insulin sliding scale. Add metformin. Follow-up fingersticks. Continue diabetic diet.  3. History of hypertension and hyperlipidemia. Controlled. Continue with current management.        DVT Prophylaxis: Lovenox     Code Status: Full code    Disposition: Needs iv antibiotics for 6 weeks.     Plan discussed with patient, nursing staff.    Signed by: Charmian Muff, MD  [address]  [place]  Office Ph: [phone]    Please contact at phone number [phone] if any questions.          Note: This chart was generated by the Epic EMR system/speech recognition and may contain inherent errors or omissions not intended by the user. Grammatical errors, random word insertions, deletions, pronoun errors and incomplete sentences are occasional consequences of this technology due to software limitations. Not all errors are caught or corrected. If there are questions or concerns about the content of this note or information contained within the body of this dictation they should be addressed directly with the author for clarification

## 2015-03-15 NOTE — Plan of Care (Signed)
Problem: Pain  Goal: Patient's pain/discomfort is manageable  Outcome: Progressing    Problem: Tissue integrity  Goal: Damaged tissue is healing and protected  Outcome: Progressing

## 2015-03-15 NOTE — Progress Notes (Signed)
Assumed care of patient; patient alert and oriented, resting in bed.  No c/o n/v/pain, left foot dressing c/d/i, neurovasc check WNL.  PICC line dressing c/d/i, was infusing KVO at start of shift, now INTed. No needs at this time, call light and personal items in reach, will continue to monitor.

## 2015-03-15 NOTE — Plan of Care (Signed)
Problem: Potential for Compromised Skin Integrity  Goal: Skin integrity is maintained or improved  Assess and monitor skin integrity. Identify patients at risk for skin breakdown on admission and per policy. Collaborate with interdisciplinary team and initiate plans and interventions as needed.   Outcome: Progressing  Dr will change dressing on left foot    Problem: Tissue integrity  Goal: Damaged tissue is healing and protected  Outcome: Progressing

## 2015-03-16 ENCOUNTER — Encounter: Payer: Self-pay | Admitting: Foot & Ankle Surgery

## 2015-03-16 ENCOUNTER — Inpatient Hospital Stay: Payer: BC Managed Care – PPO

## 2015-03-16 LAB — VH DEXTROSE STICK GLUCOSE
Glucose POCT: 186 mg/dL — ABNORMAL HIGH (ref 70–99)
Glucose POCT: 123 mg/dL — ABNORMAL HIGH (ref 70–99)
Glucose POCT: 135 mg/dL — ABNORMAL HIGH (ref 70–99)
Glucose POCT: 190 mg/dL — ABNORMAL HIGH (ref 70–99)
Glucose POCT: 100 mg/dL — ABNORMAL HIGH (ref 70–99)

## 2015-03-16 NOTE — Plan of Care (Signed)
Problem: Safety  Goal: Patient will be free from injury during hospitalization  Outcome: Progressing    Problem: Pain  Goal: Patient's pain/discomfort is manageable  Outcome: Progressing

## 2015-03-16 NOTE — Progress Notes (Addendum)
PODIATRIC SURGERY   PROGRESS NOTE   Spectra# [phone]/Pager [phone]    Date Time: 03/16/2015 8:50 AM  Patient Name: [facility] Day: 7    Assessment:   [name] is a 50 y.o. male POD#4 s/p left foot I&D and 1st metatarsal head resection with implantation of abx cement    Plan:   - Continue IV abx per ID recs  - Daily dressing changes while in house  - Medical mgmt per primary team  - Heel touch weight bearing in surgical shoe to left foot  - Continue to follow    Addendum:  - Discussed with Dr. Evette Doffing. Stable for discharge from podiatry standpoint.    Hinton Lovely, DPM  [facility], PGY-2  Pager: [phone]    Subjective:   Pt seen at bedside this AM, doing well. No complaints. NAEON. Denies NVFC.    Current Vitals:   Filed Vitals:    03/16/15 0716   BP: 127/82   Pulse: 83   Temp: 98.1 F (36.7 C)   Resp: 19   SpO2: 98%     Vital signs in last 24 hours:Temp:  [98.1 F (36.7 C)-98.8 F (37.1 C)] 98.1 F (36.7 C)  Heart Rate:  [57-86] 83  Resp Rate:  [16-19] 19  BP: (109-139)/(57-85) 127/82 mmHg    Physical Exam:   Gen: AOx3, NAD    Vascular: DP/PT palpable BL, CRT brisk to digts    Derm: dorsomedial incision well coapted with small eschar and sanguinous drainage from dorsomedial wound. Erythema and edema resolving, no fluctuance noted    Ortho: no tenderness to palpation noted    Neuro: light touch sensation decreased BL    Labs:   Hematology (most recent):  Lab Results   Component Value Date/Time    WBC 10.0 03/13/2015 05:29 AM    HEMATOCRIT 34.0* 03/13/2015 05:29 AM    HEMOGLOBIN 11.5* 03/13/2015 05:29 AM   Chemistry (most recent):  Lab Results   Component Value Date/Time    SODIUM 134* 03/13/2015 05:29 AM    POTASSIUM 4.3 03/13/2015 05:29 AM    CHLORIDE 103 03/13/2015 05:29 AM    CO2 23.7 03/13/2015 05:29 AM    BUN 21 03/13/2015 05:29 AM    CREATININE 0.87 03/15/2015 05:26 AM    GLUCOSE 264* 03/13/2015 05:29 AM    CALCIUM 8.4* 03/13/2015 05:29 AM   Coagulation Profile (most  recent):  Lab Results   Component Value Date/Time    PT 10.9 03/13/2015 07:00 AM    PT INR 1.0 03/13/2015 07:00 AM   Hepatic Profile (most recent):  Lab Results   Component Value Date/Time    AST (SGOT) 16 03/13/2015 05:29 AM    AST (SGOT) 11 03/13/2015 05:29 AM    ALT 20 03/13/2015 05:29 AM    ALT 21 03/13/2015 05:29 AM   Nutrition Profile (last two values):  Lab Results   Component Value Date/Time    ALBUMIN 2.0* 03/13/2015 05:29 AM    ALBUMIN 2.0* 03/13/2015 05:29 AM       Microbiology:  Wound culture 1/26: Group B strep       Radiology:   Results reviewed.    Problem List:     Patient Active Problem List   Diagnosis   . Type 2 diabetes mellitus with diabetic neuropathy   . Carpal tunnel syndrome of right wrist   . Nicotine abuse   . Diabetic ulcer of left foot associated with type 2 diabetes mellitus   .  Acute renal failure   . Diabetes mellitus with foot ulcer   . HTN (hypertension)   . AKI (acute kidney injury)   . Hyperlipemia   . Diabetic foot ulcer         Hinton Lovely, [place], [place]  Pager: [phone]

## 2015-03-16 NOTE — Progress Notes (Signed)
Infectious Disease Medication Order for:  [name],[name]  DOB:  09-28-1965,  50 y.o.  MRN:  [id number]  PCP:   Rhunette Croft, MD   Infection Diagnosis GpB strep osteo foot    Drug 1 of  . Drug 2 of   . Drug 3 of  .   Antibiotic Name cefazolin     Route Intravenous Infusion Intravenous Infusion Intravenous Infusion   Dose & Frequency  2gm every8   hours.   every    hours.   every    hours.   Duration of treatment  42 days   days   days   Start Date 1/26     Labs Needed for monitoring     Weekly cbc, cmp    Estimated Creatinine Clearance: 112.2 mL/min (based on Cr of 0.87).   PICC line removal  Should PICC line be removed by Home Health after antibiotic therapy is completed? - NO   ID Physician will follow the patient outside the hospital? yes   Follow up at  7-10 days post discharge       Hardin Negus, MD  03/16/2015, 8:53 AM    -  By electronically signing my note, I affirm that this is a valid medical order that can be used outside the hospital.

## 2015-03-16 NOTE — Progress Notes (Signed)
Assumed care of patient. Pt A&O x4, denies any N/V or pain. Pt resting in bed, denies any needs at this time. Will continue to monitor.    Helane Rima

## 2015-03-16 NOTE — Progress Notes (Signed)
03-16-2015 RNCM rec call from MD, met with pt, ordered IV ATB from [facility], HH with SN, PT ordered through [facility], Manual WC, FWW ordered through [facility].  pt has transportation available after 2 pm on Tues. pt will [place] on Tues. Dr Clementeen Graham notified.  Richrd Sox, RN, BSN   RN Case Manager    Desk: [phone]

## 2015-03-16 NOTE — Progress Notes (Addendum)
Date Time: 03/16/2015 12:05 PM  Patient Name: [name], [name]       CSN: [id number]  Attending Physician: Charmian Muff, MD  Primary Care Provider: Rhunette Croft, MD    Interim summary [facility] course)   50 year old male with past medical history about his modest type II, uncontrolled with hypoglycemia and with diabetic peripheral neuropathy, hypertension, hyperlipidemia was admitted to [facility] observation unit for diabetic foot ulcer with cellulitis and abscess. Patient was confirmed to have osteomyelitis of left hallux on MRI. Patient was started on broad-spectrum antibiotics. Podiatry service was consulted and patient underwent successful incision and drainage of left foot ulcer. Patient was transferred to medicine service for further management. Bone and wound culture grew streptococcal agalactie. ID service was consulted and antibiotics were changed to iv cefazolin. PICC line was placed for 6 weeks of iv antibiotic course.     SUBJECTIVE:     CC: Follow up of uncontrolled diabetes mellitus type 2 with hyperglycemia, infected left diabetic foot ulcer status post incision and drainage, streptococcal acute osteomyelitis left hallux with SIRS, history of hypertension and hyperlipidemia    No fever no shortness of breath no diarrhea no dysuria. No chest pain no palpitations.     PHYSICAL EXAM:   Temp:  [98.1 F (36.7 C)-98.8 F (37.1 C)] 98.1 F (36.7 C)  Heart Rate:  [57-86] 83  Resp Rate:  [16-19] 19  BP: (109-139)/(57-85) 127/82 mmHg  Body mass index is 36.28 kg/(m^2).    Intake/Output Summary (Last 24 hours) at 03/16/15 1205  Last data filed at 03/16/15 0549   Gross per 24 hour   Intake   1494 ml   Output      0 ml   Net   1494 ml     Weight Monitoring 12/25/2014 12/26/2014 12/26/2014 02/10/2015 03/10/2015 03/11/2015 03/12/2015   Height 167.6 cm 167.6 cm 167.6 cm 165.1 cm - 167.6 cm 167.6 cm   Height Method - Stated Stated - - Stated -   Weight 94.53  kg 94.348 kg 92.715 kg 100.88 kg 101.9 kg - 101.9 kg   Weight Method - Stated Actual - Actual - -   BMI (calculated) 33.7 kg/m2 33.6 kg/m2 33.1 kg/m2 37.1 kg/m2 - - 36.3 kg/m2         Exam:   General: Obese, no acute distress   Psychiatry: Patient is awake, alert and oriented x 3, mood is appropriate   Chest: CTA bilaterally. No crackles or wheezing.   CVS: S1, S2 normal, RRR  Abdomen: soft and non tender with good bowel sounds.   Musculoskeletal: No pitting edema, no cyanosis/clubbing. ACE bandage left foot   MEDS: (SCHEDULED/INFUSIONS/PRN)     Current Facility-Administered Medications   Medication Dose Route Frequency   . aspirin  81 mg Oral Daily   . atorvastatin  10 mg Oral QHS   . ceFAZolin  2 g Intravenous Q8H   . enoxaparin  40 mg Subcutaneous Q24H   . famotidine  20 mg Oral Daily   . gabapentin  300 mg Oral TID   . heparin FLUSH  3-5 mL Intracatheter Daily   . insulin aspart  10 Units Subcutaneous TID AC   . insulin glargine  28 Units Subcutaneous QAM   . lactobacillus species  50 Billion CFU Oral Daily   . lisinopril  20 mg Oral QHS   . metFORMIN  1,000 mg Oral BID Meals   . sodium chloride (PF)  10 mL Intracatheter Daily  Current Facility-Administered Medications   Medication Dose Route Frequency Last Rate     Current Facility-Administered Medications   Medication Dose Route   . acetaminophen  650 mg Oral    Or   . acetaminophen  650 mg Per NG tube    Or   . acetaminophen  650 mg Rectal   . acetaminophen  650 mg Oral    Or   . acetaminophen  650 mg Per NG tube    Or   . acetaminophen  650 mg Rectal   . cyclobenzaprine  10 mg Oral   . dextrose  25 mL Intravenous   . dextrose  25 mL Intravenous   . hydrALAZINE  10 mg Intravenous   . HYDROmorphone  0.5 mg Intravenous   . insulin aspart  1-4 Units Subcutaneous   . insulin aspart  2-12 Units Subcutaneous   . naloxone  0.4 mg Intravenous   . ondansetron  4 mg Oral    Or   . ondansetron  4 mg Intravenous   . oxyCODONE-acetaminophen  1-2 tablet Oral   . sodium  chloride (PF)  10 mL Intracatheter         LABS:       Recent Labs  Lab 03/13/15  0529 03/11/15  0511   WBC 10.0 9.8   RBC 3.67* 3.69*   HEMOGLOBIN 11.5* 11.6*   HEMATOCRIT 34.0* 34.3*   MCV 92 93   PLT CT 246 220         Recent Labs  Lab 03/15/15  0526 03/14/15  0353 03/13/15  0529 03/11/15  0511   SODIUM  --   --  134* 136   POTASSIUM  --   --  4.3 4.0   CHLORIDE  --   --  103 101   CO2  --   --  23.7 26.1   BUN  --   --  21 30*   CREATININE 0.87 1.00 1.06 1.22   GLUCOSE  --   --  264* 246*   EGFR >60 >60 >60 >60   CALCIUM  --   --  8.4* 8.3*           IMAGING STUDIES:   Xr Foot Left Ap Lateral And Oblique    03/12/2015  Satisfactory postop left 1st metatarsal. ReadingStation:[email or web address]    Xr Foot Left Ap Lateral And Oblique    03/10/2015  Recurrent soft tissue thickening and ulceration at the medial aspect of the left first MTP joint, with progressive erosion of the metatarsal head favoring osteomyelitis in this context over gout. ReadingStation:[email or web address]    Mri Foot Left W Wo Contrast    03/10/2015  Soft tissue ulcer medial aspect left first MTP joint, accompanied by peripherally enhancing fluid collection along the abductor hallucis, synovitis, joint effusion, and osteomyelitis with progressive erosion of the metatarsal head.  ReadingStation:[email or web address]      MICROBIOLOGY AND/OR TELEMETRY:   Recent Blood culture: No growth  Wound culture:Streptococus agalactie  Bone culture: Streptococus agalactie    ASSESSMENT AND PLAN:   1. 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 SIRS +. (no sepsis) Continue with iv cefazolin per ID.Continue with current pain control. Continue with local wound care. PICC already in place. Discussed with Joelene Millin about discharge planning. ID has placed iv antibiotic orders in EPIC this morning.    2. History of diabetes mellitus type 2. controlled. cont Lantus. Cont pre-meal NovoLog. Continue with insulin sliding scale. Cont metformin. Follow-up fingersticks.  Continue diabetic diet.  3. History of hypertension and hyperlipidemia. Controlled. Continue with current management.        DVT Prophylaxis: Lovenox     Code Status: Full code    Disposition: Needs iv antibiotics for 6 weeks.     Plan discussed with patient, nursing staff.    Signed by: Charmian Muff, MD  [address]  [place]  Office Ph: [phone]    Please contact at phone number [phone] if any questions.          Note: This chart was generated by the Epic EMR system/speech recognition and may contain inherent errors or omissions not intended by the user. Grammatical errors, random word insertions, deletions, pronoun errors and incomplete sentences are occasional consequences of this technology due to software limitations. Not all errors are caught or corrected. If there are questions or concerns about the content of this note or information contained within the body of this dictation they should be addressed directly with the author for clarification

## 2015-03-16 NOTE — Progress Notes (Signed)
Pt has had an uneventful shift. Denies any n/v or pain so far this shift. Voiding adequately. PICC lumen flushed and good blood return noted. Pt is up ad lib in chair. VSS. Tolerating CCDD without difficulty. Pt denies any needs at this time. Will continue to monitor.    Helane Rima

## 2015-03-17 LAB — VH DEXTROSE STICK GLUCOSE
Glucose POCT: 149 mg/dL — ABNORMAL HIGH (ref 70–99)
Glucose POCT: 144 mg/dL — ABNORMAL HIGH (ref 70–99)

## 2015-03-17 MED ORDER — INSULIN ASPART 100 UNIT/ML SC SOPN
2.0000 [IU] | PEN_INJECTOR | Freq: Four times a day (QID) | SUBCUTANEOUS | Status: DC | PRN
Start: 2015-03-17 — End: 2016-03-10

## 2015-03-17 MED ORDER — INSULIN ASPART 100 UNIT/ML SC SOPN
1.0000 [IU] | PEN_INJECTOR | Freq: Every evening | SUBCUTANEOUS | Status: DC | PRN
Start: 2015-03-17 — End: 2016-03-10

## 2015-03-17 MED ORDER — FAMOTIDINE 20 MG PO TABS
20.0000 mg | ORAL_TABLET | Freq: Every day | ORAL | Status: DC
Start: 2015-03-17 — End: 2016-03-04

## 2015-03-17 MED ORDER — OXYCODONE-ACETAMINOPHEN 5-325 MG PO TABS
1.0000 | ORAL_TABLET | ORAL | Status: DC | PRN
Start: 2015-03-17 — End: 2015-07-09

## 2015-03-17 MED ORDER — INSULIN GLARGINE 100 UNIT/ML SC SOPN
28.0000 [IU] | PEN_INJECTOR | Freq: Every morning | SUBCUTANEOUS | Status: DC
Start: 2015-03-17 — End: 2016-03-10

## 2015-03-17 NOTE — Plan of Care (Signed)
Problem: Safety  Goal: Patient will be free from injury during hospitalization  Intervention: Use appropriate transfer methods  Pt d/c home via w/c, vss.

## 2015-03-17 NOTE — Discharge Instructions (Signed)
Exercise to Manage Your Blood Sugar  Being physically active every day can help you manage your blood sugar. That's because an active lifestyle can improve your body's ability to use insulin. Daily activity can also help delay or prevent complications of diabetes. And it's a great way to relieve stress. If you aren't normally active, be sure to consult your health care provider before getting started.  How Much Activity Do You Need?  If daily activity is new to you, start slow and steady. Begin with10minutes of activity each day. Then work up to at least40 minutes of moderate to high intensity physical activity on at least 3 to 4 days each week. Do this by adding a few minutes each week. It doesn't have to be done all at once. Each active period throughout the day adds up.  Just Move!  You don't have to join a gym or own pricey sports equipment. Just get out and walk. Walking is an aerobic exercise that makes your heart and lungs work hard. It helps your heart and blood vessels. Walking requires only a sturdy pair of sneakers and your own two feet. The more you walk, the easier it gets.   Schedule time every day to move your feet.   Make it part of your daily routine.   Walk with a friend or a group to keep it interesting and fun.   Try taking several short walks during the day to meet your daily activity goal.    Adding Resistance Exercise  Resistance exercise (also called strength training), makes muscles stronger. It also helps muscles use insulin better. Ask your health care provider whether this type of exercise is right for you. If it is, your health care provider can help you work it in to your activity plan.  Staying Safe  Being active may cause blood sugar to drop faster than usual. This is especially true if you take medication to manage your blood sugar. But there are things you can do to help reduce the risk of accidental lows. Keep these tips in mind:   Always carry identification when you  exercise outside your home. Carry a cell phone to use in case of emergency.   If you can, include friends and family in your activities.   Wear a medical ID bracelet that says you have diabetes.   Use the right safety equipment for the activity you do (such as a bicycle helmet when you ride a bicycle outdoors). Wear closed-toed shoes that fit your feet well.   Drink plenty of water before and during activity.   Keep a fast-acting sugar (such as glucose tablets) on hand in case of low blood sugar.   Dress properly for the weather. Wear a hat if it's sunny, or wait until evening if it's too hot.   Avoid being active for long periods in very hot or very cold weather.   Skip activity if you're sick.       2000-2015 [employer] [address], [place] [address]. All rights reserved. This information is not intended as a substitute for professional medical care. Always follow your healthcare professional's instructions.

## 2015-03-17 NOTE — Progress Notes (Signed)
PROGRESS NOTE    Date Time: 03/17/2015 9:37 AM  Patient Name: [name],[name]    Assessment:   Principal Problem:    Diabetic foot ulcer      Plan:   Home today on cefazolinplanned    Subjective:   Neuropathy flaring in hands and feet    Medications:     Current Facility-Administered Medications   Medication Dose Route Frequency   . aspirin  81 mg Oral Daily   . atorvastatin  10 mg Oral QHS   . ceFAZolin  2 g Intravenous Q8H   . enoxaparin  40 mg Subcutaneous Q24H   . famotidine  20 mg Oral Daily   . gabapentin  300 mg Oral TID   . heparin FLUSH  3-5 mL Intracatheter Daily   . insulin aspart  10 Units Subcutaneous TID AC   . insulin glargine  28 Units Subcutaneous QAM   . lactobacillus species  50 Billion CFU Oral Daily   . lisinopril  20 mg Oral QHS   . metFORMIN  1,000 mg Oral BID Meals   . sodium chloride (PF)  10 mL Intracatheter Daily       Physical Exam:     Filed Vitals:    03/17/15 0742   BP: 132/69   Pulse: 85   Temp: 99.1 F (37.3 C)   Resp: 16   SpO2: 97%       Intake and Output Summary (Last 24 hours) at Date Time    Intake/Output Summary (Last 24 hours) at 03/17/15 0937  Last data filed at 03/16/15 1300   Gross per 24 hour   Intake    840 ml   Output      0 ml   Net    840 ml           Labs:     Results     Procedure Component Value Units Date/Time    Dextrose Stick Glucose [id number]  (Abnormal) Collected:  03/17/15 0723    Specimen Information:  Blood Updated:  03/17/15 0739     Glucose, POCT 149 (H) mg/dL     Dextrose Stick Glucose [id number]  (Abnormal) Collected:  03/16/15 2114    Specimen Information:  Blood Updated:  03/16/15 2130     Glucose, POCT 100 (H) mg/dL     Dextrose Stick Glucose [id number]  (Abnormal) Collected:  03/16/15 1613    Specimen Information:  Blood Updated:  03/16/15 1629     Glucose, POCT 190 (H) mg/dL     Dextrose Stick Glucose [id number]  (Abnormal) Collected:  03/16/15 1122    Specimen Information:  Blood Updated:  03/16/15 1139     Glucose, POCT 135 (H) mg/dL            Rads:   Radiological Procedure reviewed.    Signed by: Hardin Negus, MD

## 2015-03-17 NOTE — Plan of Care (Signed)
Problem: Safety  Goal: Patient will be free from injury during hospitalization  Pt d/c via w/c, vss.

## 2015-03-17 NOTE — PT Progress Note (Signed)
[facility]: [facility]  Department of Rehabilitation Services: [phone]  MICHAEL DAFOE    CSN: [id number]    4TH SURGICAL   480/480-A    Physical Therapy Treatment Note    Time of treatment:   Time Calculation  PT Received On: 03/17/15  Start Time: 1020  Stop Time: 1030  Time Calculation (min): 10 min    Visit#: 2    Last seen by Physical therapist vs. PTA: 03/17/2015     Medical Diagnosis/Pertinent medical/surgical details: diabetic foot ulcer with cellulitis and abscess s/o I&D of left foot ulcer with resection of left first metatarsal head and antibiotic cement spacer implanted on 03/12/15    Precautions and Contraindications:  Weight Bearing: Left LE Non weight bearing for ambulation but able to perform heel weight bearing with ortho wedge shoe for transfers (podiatrist phoned by evaluating PT on 03/13/15 per eval note)   Falls  Ortho wedge shoe when out of bed - per PT eval note.    Assessment:   Patient's progress towards established goals: no progress. Patient reporting he is ambulating with his ortho wedge shoe to the bathroom with a walker. When asked about NWB LLE, he reports that he is but then states that the ortho wedge shoe "rocks". I encouraged him to work with me to ensure he is able to maintain NWB LLE per physician orders but patient declines. He then proceeded to ask to use crutches. He was educated that crutches are contraindicated with presence of PICC line. PT offered education on home mobility and importance of compliance to weight bearing restrictions. Patient voicing understanding and declining further PT services. RN aware.     Patient continues to have the following impairments: decreased safety/judgement during functional mobility, decreased activity tolerance, decreased functional mobility, decreased balance, gait deficits    Patient will continue to benefit from skilled PT services in order to address above impairments to maximize functional outcomes and independence.           Goals:   LTG (By D/C)  1. Pt will be able to correctly verbalize weight bearing precautions in prep for safe mobility at homeONGOING  2. Pt will perform SPT with front wheeled walker and ortho wedge shoe left, while maintaining heel weight bearing modified independently in prep for safe transfersONGOING  3. Pt will ambulate 20' with front wheeled walker maintaining NWB status left LE with ortho wedge shoe donned in prep for safe household distance ambulationONGOING  4. Pt will self propel wheelchair 200 feet independently in prep for safe community distance mobilityONGOING  5. Pt will independently don/doff ortho wedge shoe left LE in prep for safe mobililtyONGOING     Plan:   Treatment/interventions: Exercise, Gait training, Functional transfer training, LE strengthening/ROM, Patient/caregiver training, Equipment eval/education    Treatment Frequency: one time visit- patient discharged to home today and declining further PT.     DISCHARGE RECOMMENDATIONS   DME recommended for Discharge:   Front wheeled walker  Wheelchair - manual    Discharge Recommendations:   Home with supervision;Home with home health PT ; patient appears to have home space restrictions and would benefit from HHPT for home safety assessment.    Subjective:   "I'm fine. I'm worried about my foot. The dressing hasn't been changed in days." Patient also reporting he has been ambulating in the room with the walker and ortho wedge shoe. "It rocks." PT attempting to educate patient on Podiatry orders for weight bearing guidelines.    Nursing  clears patient for therapy.     Pain:  At Rest: 0 /10  With Activity: 0/10  Location: N/A  Interventions: None required    OBJECTIVE:   Observation of Patient/Vital Signs:   Patient is in bed with no medical equipment in place.  Patient's medical condition is appropriate for Physical therapy intervention at this  time.      Musculoskeletal and Balance Details:   N/A         Functional Mobility:   Bed Mobility:   Not Tested due to patient declining further PT participation    Transfers:  N/A    Locomotion:  N/A    Participation and Activity Tolerance   Participation effort: Poor  Activity Tolerance: Not tested or applicable    Other Treatment Interventions this session:   Patient/family/caregiver education     Education Provided:   TOPICS: role of physical therapy, plan of care, goals of therapy and safety with mobility and ADLs, benefits of activity, weight bearing precautions, home safety, use of adaptive equipment, activity with nursing, importance of compliance with weight bearing restrictions for home safety; prior PT recommending w/c usage (which patient reported a wheelchair will not fit in his house)    Learner educated: Patient  Method: Explanation  Response to education: Verbalized understanding    Patient Position at End of Treatment:   Supine, in bed, Needs in reach and No distress    Team Communication:   Spoke to : RN/LPN - Alicia  Regarding: Pre-session re: patient status, Patient position at end of session, Discharge needs, Patient participation with Therapy, and patient asking about dressing change to left foot  Whiteboard updated: N/A  PT/PTA communication: via written note and verbal communication as needed.      Recommend patient be up with ortho wedge those to left foot with walker (current orders stand as NWB for ambulation)  outside of PT sessions.    Vertis Kelch    '

## 2015-03-17 NOTE — Progress Notes (Signed)
03-17-2015 rec call from [facility] to report FWW to be delivered by coordinator to pt room with plan to have [place] delivered via delivery truck to pt room between 2 and 4 pm.  [place] delivering IV med to pt home this date. [facility] ordered.  Richrd Sox, RN, BSN   RN Case Manager    Desk: [phone]

## 2015-03-17 NOTE — Discharge Summary (Signed)
[facility]    Patient Name: [name],[name]  Attending Physician: Charmian Muff, MD  Primary Care Physician: Rhunette Croft, MD    Date of Admission: 03/10/2015  Date of Discharge: 03/17/2015  Length of Stay in the Hospital: 5    Discharge Diagnoses:                                                                         1. 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  2. DM type 2  3. HTN    Discharge Medications:                                                                            Medication List      START taking these medications          famotidine 20 MG tablet   Commonly known as:  PEPCID   Take 1 tablet (20 mg total) by mouth daily.       insulin glargine 100 UNIT/ML injection pen   Commonly known as:  LANTUS SOLOSTAR   Inject 28 Units into the skin every morning.       oxyCODONE-acetaminophen 5-325 MG per tablet   Commonly known as:  PERCOCET   Take 1-2 tablets by mouth every 4 (four) hours as needed.         CHANGE how you take these medications          * insulin aspart 100 UNIT/ML injection pen   Commonly known as:  NOVOLOG FLEXPEN   Inject 7 Units into the skin 3 (three) times daily before meals.   What changed:  Another medication with the same name was added. Make sure you understand how and when to take each.       * insulin aspart 100 UNIT/ML injection pen   Commonly known as:  NovoLOG   Inject 2-12 Units into the skin every 6 (six) hours as needed for High Blood Sugar.   What changed:  You were already taking a medication with the same name, and this prescription was added. Make sure you understand how and when to take each.       * insulin aspart 100 UNIT/ML injection pen   Commonly known as:  NovoLOG   Inject 1-4 Units into the skin nightly as needed for High Blood Sugar.   What changed:  You were already taking a medication with the same name, and this prescription was added. Make sure you understand how  and when to take each.       * Notice:  This list has 3 medication(s) that are the same as other medications prescribed for you. Read the directions carefully, and ask your doctor or other care provider to review them with you.      CONTINUE taking these medications          aspirin 81 MG chewable  tablet   Commonly known as:  ASPIRIN CHILDRENS   Chew 1 tablet (81 mg total) by mouth daily.       atorvastatin 10 MG tablet   Commonly known as:  LIPITOR       collagenase ointment   Commonly known as:  SANTYL   Apply topically daily.       cyclobenzaprine 10 MG tablet   Commonly known as:  FLEXERIL   Take 1 tablet (10 mg total) by mouth every 8 (eight) hours as needed for Muscle spasms (pain or muscle spasm).       gabapentin 300 MG capsule   Commonly known as:  NEURONTIN   Take 1 capsule (300 mg total) by mouth 3 (three) times daily.       lisinopril 20 MG tablet   Commonly known as:  PRINIVIL,ZESTRIL       metFORMIN 1000 MG tablet   Commonly known as:  GLUCOPHAGE   Take 1 tablet (1,000 mg total) by mouth 2 (two) times daily with meals.       oxymetazoline 0.05 % nasal spray   Commonly known as:  AFRIN         STOP taking these medications          ciprofloxacin 500 MG tablet   Commonly known as:  CIPRO       LEVEMIR FLEXPEN SC            Where to Get Your Medications      These medications were sent to [facility], [address]  [place], [place] [address]     Phone:  [phone]    - famotidine 20 MG tablet      You can get these medications from any pharmacy     Bring a paper prescription for each of these medications    - oxyCODONE-acetaminophen 5-325 MG per tablet      Information about where to get these medications is not yet available     ! Ask your nurse or doctor about these medications    - insulin aspart 100 UNIT/ML injection pen  - insulin aspart 100 UNIT/ML injection pen  - insulin glargine 100 UNIT/ML injection pen          Consultations:                                                                                        ID    Labs:                                                                                         Recent Labs  Lab 03/13/15  0529 03/11/15  0511 03/10/15  1919   WBC 10.0 9.8 12.0*   RBC  3.67* 3.69* 4.26   HEMOGLOBIN 11.5* 11.6* 13.3   HEMATOCRIT 34.0* 34.3* 40.0   MCV 92 93 94   PLT CT 246 220 222         Recent Labs  Lab 03/15/15  0526 03/14/15  0353 03/13/15  0529 03/11/15  0511 03/11/15  0037 03/10/15  1919   SODIUM  --   --  134* 136 135* 136   POTASSIUM  --   --  4.3 4.0 4.2 4.7   CHLORIDE  --   --  103 101 100 99   CO2  --   --  23.7 26.1 26.4 26.3   BUN  --   --  21 30* 34* 31*   CREATININE 0.87 1.00 1.06 1.22 1.27 1.50*   GLUCOSE  --   --  264* 246* 283* 435*   CALCIUM  --   --  8.4* 8.3* 8.3* 9.4         Recent Labs  Lab 03/13/15  0529 03/11/15  0511   ALT 21  20 15    AST (SGOT) 11  16 14    BILIRUBIN, TOTAL 0.3  0.4 0.4   BILIRUBIN, DIRECT 0.2  --    ALBUMIN 2.0*  2.0* 2.1*   ALKALINE PHOSPHATASE 137  154* 88         Recent Labs  Lab 03/13/15  0700   PT INR 1.0       Imaging studies:                                                                                       Xr Foot Left Ap Lateral And Oblique    03/12/2015  Satisfactory postop left 1st metatarsal. ReadingStation:[email or web address]    Xr Foot Left Ap Lateral And Oblique    03/10/2015  Recurrent soft tissue thickening and ulceration at the medial aspect of the left first MTP joint, with progressive erosion of the metatarsal head favoring osteomyelitis in this context over gout. ReadingStation:[email or web address]    Mri Foot Left W Wo Contrast    03/10/2015  Soft tissue ulcer medial aspect left first MTP joint, accompanied by peripherally enhancing fluid collection along the abductor hallucis, synovitis, joint effusion, and osteomyelitis with progressive erosion of the metatarsal head.  ReadingStation:[email or web address]      Culture results:                                                                                                Microbiology Results     Procedure Component Value Units Date/Time    Anaerobic Culture [id number] Collected:  03/12/15 1819    Specimen Information:  Bone from Bone Updated:  03/16/15 0848    Narrative:      Specimen/Source: Bone/Bone  Collected: 03/12/2015 18:19  Status: Final      Last Updated: 03/16/2015 08:48                Culture Result (Final)      No Anaerobes Isolated          Blood Culture - Venipuncture #1 [id number] Collected:  03/13/15 0700    Specimen Information:  Blood Updated:  03/14/15 2123    Narrative:      Specimen: Blood  Collected: 03/13/2015 07:00     Status: Valued      Last Updated: 03/14/2015 21:22           (1) *If not drawn in previous 24 hours      *Specimen Blood             Culture Result (Prelim)      No Growth To Date          Bone Culture and Smear [id number] Collected:  03/12/15 1819    Specimen Information:  Bone from Left Foot Updated:  03/15/15 0655    Narrative:      Specimen/Source: Bone/Left Foot  Collected: 03/12/2015 18:19     Status: Final      Last Updated: 03/15/2015 06:55                Gram Stain (Final)      Moderate WBC's Seen      Few Gram Positive Cocci       ISO #1 (Final)      Moderate Growth      Streptococcus agalactiae (Group B)      Results called and read back by (licensed clinician/date/time/tech):      alicia curry rn [id number]                               ISO #1                            Streptococcus                            agalactiae (Group B)                            ---------------------    MIC (mcg/ml)      Clindamycin (CD)      >=1              R      Erythromycin ([name])      >=8              R      Penicillin (P)        <=0.06           S      Vancomycin (V)        0.5              S          Culture Wound [id number] Collected:  03/10/15 2057    Specimen Information:  Wound from Left Foot Updated:  03/12/15 0655    Narrative:      Specimen/Source: Wound/Left Foot  Collected: 03/10/2015 20:57     Status:  Final      Last Updated: 03/12/2015 06:55           (1) *Specimen Wound      *  Source Detail Left Foot             Gram Stain (Final)      Few Epithelial Cells      Many WBC's Seen      Moderate Gram Positive Cocci       ISO #1 (Final)      Moderate Growth      Streptococcus agalactiae (Group B)      If Nafcillin sensitive, Cefazolin sensitive.                               ISO #1                            Streptococcus                            agalactiae (Group B)                            ---------------------    MIC (mcg/ml)      Clindamycin (CD)      >=1              R      Erythromycin ([name])      >=8              R      Penicillin (P)        <=0.06           S      Vancomycin (V)        0.5              S                Hospital Course:                                                                                    For full details of the patient's admission please consult the whole medical record including daily progress notes, history and physical, consult notes, lab reports as well as imaging studies.  Briefly 50 year old male with past medical history about his modest type II, uncontrolled with hypoglycemia and with diabetic peripheral neuropathy, hypertension, hyperlipidemia was admitted to [facility] observation unit for diabetic foot ulcer with cellulitis and abscess. Patient was confirmed to have osteomyelitis of left hallux on MRI. Patient was started on broad-spectrum antibiotics. Podiatry service was consulted and patient underwent successful incision and drainage of left foot ulcer. Patient was transferred to medicine service for further management. Bone and wound culture grew streptococcal agalactie. ID service was consulted and antibiotics were changed to iv cefazolin. PICC line was placed for 6 weeks of iv antibiotic course.   Patient is discharged home with instructions to have follow-up with PCP and podiatry service.  Diabetic education was provided.    Discharge Day  Exam:  Temp:  [98.3 F (36.8 C)-99.1 F (37.3 C)] 99.1 F (37.3 C)  Heart Rate:  [83-94] 85  Resp Rate:  [  16-19] 16  BP: (122-133)/(67-81) 132/69 mmHg    General: Obese, no acute distress   Psychiatry: Patient is awake, alert and oriented x 3, mood is appropriate   Chest: CTA bilaterally. No crackles or wheezing.   CVS: S1, S2 normal, RRR  Abdomen: soft and non tender with good bowel sounds.   Musculoskeletal: No pitting edema, no cyanosis/clubbing. ACE bandage left foot   Neuro: CN II-XII intact, no gross focal deficit    Discharge condition: stable    Discharge instructions:                                                                             Nutrition: Heart healthy diabetic diet    Activity: Per podiatry service    Follow up:  Follow-up Information     Follow up with Rhunette Croft, MD In 1 week.    Specialty:  Family Medicine    Contact information:    [place] [address]  [phone]          Follow up with Felicie Morn, DPM In 2 weeks.    Specialty:  Foot Surgery    Contact information:    [address]  [place] [address]  [phone]            Time spent coordinating discharge and reviewing discharge plan: 37 minutes    Signed by:   Jani Gravel Marcele Kosta  03/17/2015  8:50 AM    [facility]  Office number [phone]    CC: Rhunette Croft, MD

## 2015-03-17 NOTE — Progress Notes (Signed)
03-17-2015 RNCM met with pt and rep from [facility] to be told by pt that he accepts [facility] but refuses the [facility] that was ordered  Richrd Sox, RN, BSN   RN Case Manager    Desk: [phone]

## 2015-03-17 NOTE — Plan of Care (Signed)
Problem: Safety  Goal: Patient will be free from injury during hospitalization  Pt up ad lib with walker and boot.    Problem: Pain  Goal: Patient's pain/discomfort is manageable  No c/o this am.    Problem: Tissue integrity  Goal: Damaged tissue is healing and protected  Right foot covered with dressing and ace bandages.

## 2015-03-17 NOTE — Progress Notes (Signed)
PODIATRIC SURGERY   PROGRESS NOTE   Spectra# [phone]/Pager [phone]    Date Time: 03/17/2015 10:56 AM  Patient Name: [name] Day: 8    Assessment:   [name] is a 50 y.o. male POD#5 s/p left foot I&D and 1st metatarsal head resection with implantation of abx cement    Plan:   - Continue IV abx per ID recs  - Daily dressing changes while in house, betadine wet to dry applied today  - Medical mgmt per primary team  - Heel touch weight bearing in surgical shoe to left foot  - stable for discharge today from podiatry standpoint    Subjective:   Pt seen at bedside this AM, doing well. No complaints. NAEON. Denies NVFC.    Current Vitals:   Filed Vitals:    03/17/15 0742   BP: 132/69   Pulse: 85   Temp: 99.1 F (37.3 C)   Resp: 16   SpO2: 97%     Vital signs in last 24 hours:Temp:  [98.3 F (36.8 C)-99.1 F (37.3 C)] 99.1 F (37.3 C)  Heart Rate:  [83-94] 85  Resp Rate:  [16-19] 16  BP: (122-133)/(67-81) 132/69 mmHg    Physical Exam:   Gen: AOx3, NAD    Vascular: DP/PT palpable BL, CRT brisk to digts    Derm: dorsomedial incision well coapted with small eschar and no drainage from dorsomedial wound but maceration noted. Erythema and edema resolving, no fluctuance noted    Ortho: no tenderness to palpation noted    Neuro: light touch sensation decreased BL    Labs:   Hematology (most recent):  Lab Results   Component Value Date/Time    WBC 10.0 03/13/2015 05:29 AM    HEMATOCRIT 34.0* 03/13/2015 05:29 AM    HEMOGLOBIN 11.5* 03/13/2015 05:29 AM   Chemistry (most recent):  Lab Results   Component Value Date/Time    SODIUM 134* 03/13/2015 05:29 AM    POTASSIUM 4.3 03/13/2015 05:29 AM    CHLORIDE 103 03/13/2015 05:29 AM    CO2 23.7 03/13/2015 05:29 AM    BUN 21 03/13/2015 05:29 AM    CREATININE 0.87 03/15/2015 05:26 AM    GLUCOSE 264* 03/13/2015 05:29 AM    CALCIUM 8.4* 03/13/2015 05:29 AM   Coagulation Profile (most recent):  Lab Results   Component Value Date/Time    PT 10.9 03/13/2015 07:00 AM    PT  INR 1.0 03/13/2015 07:00 AM   Hepatic Profile (most recent):  Lab Results   Component Value Date/Time    AST (SGOT) 16 03/13/2015 05:29 AM    AST (SGOT) 11 03/13/2015 05:29 AM    ALT 20 03/13/2015 05:29 AM    ALT 21 03/13/2015 05:29 AM   Nutrition Profile (last two values):  Lab Results   Component Value Date/Time    ALBUMIN 2.0* 03/13/2015 05:29 AM    ALBUMIN 2.0* 03/13/2015 05:29 AM       Microbiology:  Wound culture 1/26: Group B strep       Radiology:   Results reviewed.    Problem List:     Patient Active Problem List   Diagnosis   . Type 2 diabetes mellitus with diabetic neuropathy   . Carpal tunnel syndrome of right wrist   . Nicotine abuse   . Diabetic ulcer of left foot associated with type 2 diabetes mellitus   . Acute renal failure   . Diabetes mellitus with foot ulcer   . HTN (hypertension)   .  AKI (acute kidney injury)   . Hyperlipemia   . Diabetic foot ulcer         Hinton Lovely, [place], [place]  Pager: [phone]

## 2015-03-17 NOTE — Plan of Care (Signed)
Problem: Pain  Goal: Patient's pain/discomfort is manageable  Outcome: Progressing  Pt pain is well controlled.

## 2015-03-18 NOTE — UM Notes (Signed)
[facility] Utilization Management Review Sheet    NAME: [name]  MR#: [id number]    CSN#: [id number]  dob  1965/06/30    ROOM: 480/480-A AGE: 50 y.o.    ADMIT DATE AND TIME: 03/10/2015  6:52 PM      PATIENT CLASS:    ATTENDING PHYSICIAN: No att. providers found  PAYOR:Payor: OUT OF STATE BLUE CROSS / Plan: [place] / Product Type: *No Product type* /       AUTH #: [id number]    DIAGNOSIS:     ICD-10-CM    1. Diabetic ulcer of left foot associated with type 2 diabetes mellitus E11.621     L97.529    2. Cellulitis of left foot L03.116    3. Osteomyelitis of left foot, unspecified chronicity M86.9        HISTORY:   Past Medical History   Diagnosis Date   . Seasonal allergic rhinitis    . Diabetes mellitus    . Disc    . Hypertension    . Diabetic ulcer of left foot 12/26/14       PT DISCHARGED ON  03/17/2015    [place]  [facility]   Utilization Review  Phone  # [phone]  Fax [phone]

## 2015-03-20 NOTE — Retrospective Coding Query (Signed)
Debridement Query                                                        Patient Name: [name], [name]  Account #: [id number]  MR #: [id number]  Discharge Date: 03/17/2015    Dear Dr. Evette Doffing,      Please clarify the type of debridement performed:           Excisional debridement - definite cutting away of tissue, to remove devitalized   tissue, involves cutting outside or beyond the wound margin and not minor removal  or scraping of tissue fragments.        Non-excisional debridement - minor removal of tissue with scissors or scraping   away tissue with a sharp instrument.         And type of tissue debrided    ____ subcutaneous tissue and fascia    ____bursa and ligaments    ____ muscles    ____ tendons    ____Other______________________________________________________                  Marcina Millard, Edwena Felty  Date 03/20/2015

## 2015-03-23 ENCOUNTER — Encounter (INDEPENDENT_AMBULATORY_CARE_PROVIDER_SITE_OTHER): Payer: Self-pay | Admitting: Family Medicine

## 2015-03-23 ENCOUNTER — Ambulatory Visit (INDEPENDENT_AMBULATORY_CARE_PROVIDER_SITE_OTHER): Payer: BC Managed Care – PPO | Admitting: Family Medicine

## 2015-03-23 VITALS — BP 108/74 | HR 107 | Temp 98.8°F | Resp 16 | Ht 66.0 in | Wt 223.2 lb

## 2015-03-23 DIAGNOSIS — Z794 Long term (current) use of insulin: Secondary | ICD-10-CM

## 2015-03-23 DIAGNOSIS — E1142 Type 2 diabetes mellitus with diabetic polyneuropathy: Secondary | ICD-10-CM

## 2015-03-23 DIAGNOSIS — E1121 Type 2 diabetes mellitus with diabetic nephropathy: Secondary | ICD-10-CM | POA: Insufficient documentation

## 2015-03-23 DIAGNOSIS — M869 Osteomyelitis, unspecified: Secondary | ICD-10-CM | POA: Insufficient documentation

## 2015-03-23 DIAGNOSIS — R809 Proteinuria, unspecified: Secondary | ICD-10-CM

## 2015-03-23 NOTE — Progress Notes (Signed)
PROGRESS NOTE    Date Time: 03/23/2015 2:50 PM  Patient Name: [name], [name]  Primary Care Physician: Rhunette Croft, MD      History of Presenting Illness:   [name] is a 50 y.o. male who presents to the office with   Chief Complaint   Patient presents with   . Cellulitis     left foot   Osteomyelitis of the left great toe -the patient was hospitalized and has had an excision of the metatarsal bone with implantation of antibiotic beads.  He is also on cefazolin IV through his PICC line.  The plan is to continue the antibiotics for 6 weeks and then to be scheduled to replace the metatarsal bone with a cadaver bone .  He's had no fever or chills.  His sugars were elevated and his A1C was above 9 initially.  They've put him on a sliding scale as noted below.  He brings in a record of his sugars and they have been less than 150.Marland Kitchen  Although he's been put on a sliding scale, and he understands how to use the scale and has been compliant.  He has not required any additional insulin.  His sliding scale is as follows: 150-200 2 units, 201-250 4 units, 251-300 7 units, 301-350 10 units > 350 12 units and call.  His routine insulin dose is as follows: Lantus 28 units daily and novolog 7 units tid.  He does still have home health coming in to do his dressing changes.  He is scheduled to follow-up with Dr. Evette Doffing on Friday.  PICC cefazolin 2 grams for 6 weeks.  He is had minimal pain to the foot secondary to his diabetic neuropathy.  He's had no fever or chills.  He denies polyuria, polydipsia, polyphagia, weight change.  He's had no blurred vision.  There is been a large amount of serosanguineous drainage from the left foot.  Of note is he has stopped smoking as well.    Past Medical History:     Past Medical History   Diagnosis Date   . Seasonal allergic rhinitis    . Diabetes mellitus    . Disc    . Hypertension    . Diabetic ulcer of left foot 12/26/14       Past Surgical History:     Past Surgical History    Procedure Laterality Date   . Hand surgery     . Debridement & irrigation, lower extremity Left 03/12/2015     Procedure: [place], LOWER EXTREMITY;  Surgeon: Janora Norlander, DPM;  Location: [facility];  Service: Podiatry;  Laterality: Left;  I&D LEFT FOOT FOR BONE INFECTION       Family History:     Family History   Problem Relation Age of Onset   . Diabetes Mother    . Depression Sister    . COPD Sister    . Hypertension Brother        Social History:     History   Smoking status   . Never Smoker    Smokeless tobacco   . Never Used     History   Alcohol Use No     History   Drug Use No       Allergies:     Allergies   Allergen Reactions   . Flu Virus Vaccine Anaphylaxis       Medications:     Prior to Admission medications    Medication  Sig Start Date End Date Taking? Authorizing Provider   aspirin (ASPIRIN CHILDRENS) 81 MG chewable tablet Chew 1 tablet (81 mg total) by mouth daily. 07/24/14  Yes Rhunette Croft, MD   atorvastatin (LIPITOR) 10 MG tablet Take 10 mg by mouth nightly.   Yes [provider]   collagenase (SANTYL) ointment Apply topically daily. 12/25/14  Yes Rhunette Croft, MD   cyclobenzaprine (FLEXERIL) 10 MG tablet Take 1 tablet (10 mg total) by mouth every 8 (eight) hours as needed for Muscle spasms (pain or muscle spasm). 05/27/14  Yes Melynda Keller, PA   famotidine (PEPCID) 20 MG tablet Take 1 tablet (20 mg total) by mouth daily. 03/17/15  Yes Goyal, Jani Gravel, MD   gabapentin (NEURONTIN) 300 MG capsule Take 1 capsule (300 mg total) by mouth 3 (three) times daily. 02/12/15  Yes Rhunette Croft, MD   insulin aspart (NOVOLOG FLEXPEN) 100 UNIT/ML injection pen Inject 7 Units into the skin 3 (three) times daily before meals. 02/20/15  Yes Rhunette Croft, MD   insulin aspart (NOVOLOG) 100 UNIT/ML injection pen Inject 2-12 Units into the skin every 6 (six) hours as needed for High Blood Sugar. 03/17/15  Yes Goyal, Jani Gravel, MD    insulin aspart (NOVOLOG) 100 UNIT/ML injection pen Inject 1-4 Units into the skin nightly as needed for High Blood Sugar. 03/17/15  Yes Goyal, Jani Gravel, MD   insulin glargine (LANTUS SOLOSTAR) 100 UNIT/ML injection pen Inject 28 Units into the skin every morning. 03/17/15  Yes Goyal, Jani Gravel, MD   lisinopril (PRINIVIL,ZESTRIL) 20 MG tablet Take 20 mg by mouth nightly.   Yes [provider]   metFORMIN (GLUCOPHAGE) 1000 MG tablet Take 1 tablet (1,000 mg total) by mouth 2 (two) times daily with meals. 10/10/14  Yes Rhunette Croft, MD   oxyCODONE-acetaminophen (PERCOCET) 5-325 MG per tablet Take 1-2 tablets by mouth every 4 (four) hours as needed. 03/17/15  Yes Goyal, Jani Gravel, MD   oxymetazoline (AFRIN) 0.05 % nasal spray 2 sprays by Nasal route 2 (two) times daily as needed for Congestion.   Yes [provider]   sodium chloride 0.9 % SOLN 100 mL with ceFAZolin 1 g SOLR 2 g Infuse 2 g into the vein every 8 (eight) hours.   Yes [provider]       Review of Systems:      14 System Review negative except as noted in the history of present illness    Physical Exam:     Filed Vitals:    03/23/15 1430   BP: 108/74   Pulse: 107   Temp: 98.8 F (37.1 C)   Resp: 16   SpO2: 97%     Body mass index is 36.04 kg/(m^2).    General: awake, alert, oriented x 3; no acute distress.  HEENT: eomi, sclera anicteric, oropharynx clear without lesions, mucous membranes moist, normocephalic, atraumatic  Neck: supple, no lymphadenopathy, no thyromegaly, no JVD, no carotid bruits, trachea midline  Cardiovascular: regular rate and rhythm, no murmurs, rubs or gallops  Lungs: clear to auscultation bilaterally, without wheezing, rhonchi, or rales, resp even and unlabored with normal effort  Extremities: no clubbing, cyanosis, or edem a  Musculoskeletal: No effusion or deformity  Neuro: cranial nerves grossly intact  Skin: , Dressing intact to the left foot the wound was not undressed and  evaluated  Psych: Affect and mood are congruent, well groomed, good eye contact  Other:   Recent Results (from the  past 2016 hour(s))   Dextrose Stick Glucose    Collection Time: 12/29/14  5:33 PM   Result Value Ref Range    Glucose, POCT 245 (H) 70 - 99 mg/dL   Dextrose Stick Glucose    Collection Time: 12/29/14  9:21 PM   Result Value Ref Range    Glucose, POCT 233 (H) 70 - 99 mg/dL   Dextrose Stick Glucose    Collection Time: 12/30/14  7:27 AM   Result Value Ref Range    Glucose, POCT 209 (H) 70 - 99 mg/dL   Dextrose Stick Glucose    Collection Time: 12/30/14 11:52 AM   Result Value Ref Range    Glucose, POCT 201 (H) 70 - 99 mg/dL   Dextrose Stick Glucose    Collection Time: 12/30/14  4:54 PM   Result Value Ref Range    Glucose, POCT 173 (H) 70 - 99 mg/dL   Dextrose Stick Glucose    Collection Time: 12/30/14  8:47 PM   Result Value Ref Range    Glucose, POCT 191 (H) 70 - 99 mg/dL   Dextrose Stick Glucose    Collection Time: 12/31/14  7:11 AM   Result Value Ref Range    Glucose, POCT 151 (H) 70 - 99 mg/dL   Dextrose Stick Glucose    Collection Time: 12/31/14 12:03 PM   Result Value Ref Range    Glucose, POCT 147 (H) 70 - 99 mg/dL   Comprehensive metabolic panel    Collection Time: 01/08/15 11:15 AM   Result Value Ref Range    Sodium 141 136 - 147 mMol/L    Potassium 4.6 3.5 - 5.3 mMol/L    Chloride 105 98 - 110 mMol/L    CO2 29.0 20.0 - 30.0 mMol/L    Calcium 9.2 8.5 - 10.5 mg/dL    Glucose 258 (H) 70 - 99 mg/dL    Creatinine 0.84 0.80 - 1.30 mg/dL    BUN 18 7 - 22 mg/dL    Protein, Total 6.0 6.0 - 8.3 gm/dL    Albumin 3.2 (L) 3.5 - 5.0 gm/dL    Alkaline Phosphatase 90 40 - 145 U/L    ALT 20 0 - 55 U/L    AST (SGOT) 21 10 - 42 U/L    Bilirubin, Total 0.2 0.1 - 1.2 mg/dL    Albumin/Globulin Ratio 1.11 0.70 - 1.50 Ratio    Anion Gap 11.7 7.0 - 18.0 mMol/L    BUN/Creatinine Ratio 21.4 10.0 - 30.0 Ratio    EGFR >60 mL/min/1.17m2    Osmolality Calc 292 275 - 300 mOsm/kg    Globulin 2.8 2.0 - 4.0 gm/dL   CBC and  differential    Collection Time: 01/08/15 11:15 AM   Result Value Ref Range    WBC 10.0 4.00 - 11.00 K/cmm    RBC 4.29 4.00 - 5.70 M/cmm    Hemoglobin 12.9 (L) 13.0 - 17.5 gm/dL    Hematocrit 38.4 (L) 39.0 - 52.5 %    MCV 90 80 - 100 fL    MCH 30 28 - 35 pg    MCHC 34 31 - 36 gm/dL    RDW 12.3 10.5 - 14.5 %    PLT CT 218 130 - 440 K/cmm    MPV 7.8 6.0 - 10.0 fL    NEUTROPHIL % 73.9 42.0 - 78.0 %    Lymphocytes 19.0 15.0 - 46.0 %    Monocytes 4.7 3.0 - 15.0 %    Eosinophils %  1.7 0.0 - 7.0 %    Basophils % 0.7 0.0 - 3.0 %    Neutrophils Absolute 7.4 1.7 - 8.6 K/cmm    Lymphocytes Absolute 1.9 0.6 - 5.1 K/cmm    Monocytes Absolute 0.5 0.1 - 1.7 K/cmm    Eosinophils Absolute 0.2 0.0 - 0.8 K/cmm    BASO Absolute 0.1 0.0 - 0.3 K/cmm   Comprehensive metabolic panel    Collection Time: 01/21/15  8:50 AM   Result Value Ref Range    Sodium 138 136 - 147 mMol/L    Potassium 4.4 3.5 - 5.3 mMol/L    Chloride 105 98 - 110 mMol/L    CO2 26.0 20.0 - 30.0 mMol/L    Calcium 9.1 8.5 - 10.5 mg/dL    Glucose 230 (H) 70 - 99 mg/dL    Creatinine 0.99 0.80 - 1.30 mg/dL    BUN 26 (H) 7 - 22 mg/dL    Protein, Total 6.3 6.0 - 8.3 gm/dL    Albumin 3.2 (L) 3.5 - 5.0 gm/dL    Alkaline Phosphatase 67 40 - 145 U/L    ALT 10 0 - 55 U/L    AST (SGOT) 13 10 - 42 U/L    Bilirubin, Total 0.2 0.1 - 1.2 mg/dL    Albumin/Globulin Ratio 1.01 0.70 - 1.50 Ratio    Anion Gap 11.2 7.0 - 18.0 mMol/L    BUN/Creatinine Ratio 26.3 10.0 - 30.0 Ratio    EGFR >60 mL/min/1.63m2    Osmolality Calc 288 275 - 300 mOsm/kg    Globulin 3.1 2.0 - 4.0 gm/dL   CBC and differential    Collection Time: 01/21/15  8:50 AM   Result Value Ref Range    WBC 7.5 4.00 - 11.00 K/cmm    RBC 4.33 4.00 - 5.70 M/cmm    Hemoglobin 12.9 (L) 13.0 - 17.5 gm/dL    Hematocrit 38.6 (L) 39.0 - 52.5 %    MCV 89 80 - 100 fL    MCH 30 28 - 35 pg    MCHC 33 31 - 36 gm/dL    RDW 13.5 10.5 - 14.5 %    PLT CT 161 130 - 440 K/cmm    MPV 8.3 6.0 - 10.0 fL    NEUTROPHIL % 61.8 42.0 - 78.0 %    Lymphocytes  27.5 15.0 - 46.0 %    Monocytes 7.1 3.0 - 15.0 %    Eosinophils % 3.0 0.0 - 7.0 %    Basophils % 0.8 0.0 - 3.0 %    Neutrophils Absolute 4.6 1.7 - 8.6 K/cmm    Lymphocytes Absolute 2.1 0.6 - 5.1 K/cmm    Monocytes Absolute 0.5 0.1 - 1.7 K/cmm    Eosinophils Absolute 0.2 0.0 - 0.8 K/cmm    BASO Absolute 0.1 0.0 - 0.3 K/cmm   Hemoglobin A1C    Collection Time: 02/10/15  9:42 AM   Result Value Ref Range    Hgb A1C, % 8.0 %   Microalbumin, Random Urine    Collection Time: 02/10/15  9:42 AM   Result Value Ref Range    Microalbumin-Random, U 3425.58 (H) 0.00 - 20.00 mcg/mL   CBC    Collection Time: 03/10/15  7:19 PM   Result Value Ref Range    WBC 12.0 (H) 4.0 - 11.0 K/cmm    RBC 4.26 4.00 - 5.70 M/cmm    Hemoglobin 13.3 13.0 - 17.5 gm/dL    Hematocrit 40.0 39.0 - 52.5 %  MCV 94 80 - 100 fL    MCH 31 28 - 35 pg    MCHC 33 32 - 36 gm/dL    RDW 12.2 11.0 - 14.0 %    PLT CT 222 130 - 440 K/cmm    MPV 9.2 6.0 - 10.0 fL    NEUTROPHIL % 79.8 (H) 42.0 - 78.0 %    Lymphocytes 12.3 (L) 15.0 - 46.0 %    Monocytes 6.1 3.0 - 15.0 %    Eosinophils % 1.5 0.0 - 7.0 %    Basophils % 0.3 0.0 - 3.0 %    Neutrophils Absolute 9.5 (H) 1.7 - 8.6 K/cmm    Lymphocytes Absolute 1.5 0.6 - 5.1 K/cmm    Monocytes Absolute 0.7 0.1 - 1.7 K/cmm    Eosinophils Absolute 0.2 0.0 - 0.8 K/cmm    BASO Absolute 0.0 0.0 - 0.3 K/cmm   Basic Metabolic Panel    Collection Time: 03/10/15  7:19 PM   Result Value Ref Range    Sodium 136 136 - 147 mMol/L    Potassium 4.7 3.5 - 5.3 mMol/L    Chloride 99 98 - 110 mMol/L    CO2 26.3 20.0 - 30.0 mMol/L    Calcium 9.4 8.5 - 10.5 mg/dL    Glucose 435 (H) 70 - 99 mg/dL    Creatinine 1.50 (H) 0.80 - 1.30 mg/dL    BUN 31 (H) 7 - 22 mg/dL    Anion Gap 15.4 7.0 - 18.0 mMol/L    BUN/Creatinine Ratio 20.7 10.0 - 30.0 Ratio    EGFR 49 mL/min/1.77m2    Osmolality Calc 297 275 - 300 mOsm/kg   ESR    Collection Time: 03/10/15  7:19 PM   Result Value Ref Range    Sed Rate 74 (H) 0 - 20 mm/hr   CRP    Collection Time: 03/10/15  7:19  PM   Result Value Ref Range    C-Reactive Protein 17.07 (H) 0.02 - 0.80 mg/dL   Dextrose Stick Glucose    Collection Time: 03/10/15 10:11 PM   Result Value Ref Range    Glucose, POCT 336 (H) 70 - 99 mg/dL   Basic Metabolic Panel    Collection Time: 03/11/15 12:37 AM   Result Value Ref Range    Sodium 135 (L) 136 - 147 mMol/L    Potassium 4.2 3.5 - 5.3 mMol/L    Chloride 100 98 - 110 mMol/L    CO2 26.4 20.0 - 30.0 mMol/L    Calcium 8.3 (L) 8.5 - 10.5 mg/dL    Glucose 283 (H) 70 - 99 mg/dL    Creatinine 1.27 0.80 - 1.30 mg/dL    BUN 34 (H) 7 - 22 mg/dL    Anion Gap 12.8 7.0 - 18.0 mMol/L    BUN/Creatinine Ratio 26.8 10.0 - 30.0 Ratio    EGFR 60 mL/min/1.82m2    Osmolality Calc 288 275 - 300 mOsm/kg   CBC with differential    Collection Time: 03/11/15  5:11 AM   Result Value Ref Range    WBC 9.8 4.0 - 11.0 K/cmm    RBC 3.69 (L) 4.00 - 5.70 M/cmm    Hemoglobin 11.6 (L) 13.0 - 17.5 gm/dL    Hematocrit 34.3 (L) 39.0 - 52.5 %    MCV 93 80 - 100 fL    MCH 31 28 - 35 pg    MCHC 34 32 - 36 gm/dL    RDW 12.2 11.0 - 14.0 %  PLT CT 220 130 - 440 K/cmm    MPV 8.9 6.0 - 10.0 fL    NEUTROPHIL % 71.5 42.0 - 78.0 %    Lymphocytes 18.7 15.0 - 46.0 %    Monocytes 7.4 3.0 - 15.0 %    Eosinophils % 1.8 0.0 - 7.0 %    Basophils % 0.6 0.0 - 3.0 %    Neutrophils Absolute 7.0 1.7 - 8.6 K/cmm    Lymphocytes Absolute 1.8 0.6 - 5.1 K/cmm    Monocytes Absolute 0.7 0.1 - 1.7 K/cmm    Eosinophils Absolute 0.2 0.0 - 0.8 K/cmm    BASO Absolute 0.1 0.0 - 0.3 K/cmm   Comprehensive metabolic panel    Collection Time: 03/11/15  5:11 AM   Result Value Ref Range    Sodium 136 136 - 147 mMol/L    Potassium 4.0 3.5 - 5.3 mMol/L    Chloride 101 98 - 110 mMol/L    CO2 26.1 20.0 - 30.0 mMol/L    Calcium 8.3 (L) 8.5 - 10.5 mg/dL    Glucose 246 (H) 70 - 99 mg/dL    Creatinine 1.22 0.80 - 1.30 mg/dL    BUN 30 (H) 7 - 22 mg/dL    Protein, Total 6.1 6.0 - 8.3 gm/dL    Albumin 2.1 (L) 3.5 - 5.0 gm/dL    Alkaline Phosphatase 88 40 - 145 U/L    ALT 15 0 - 55 U/L     AST (SGOT) 14 10 - 42 U/L    Bilirubin, Total 0.4 0.1 - 1.2 mg/dL    Albumin/Globulin Ratio 0.53 (L) 0.70 - 1.50 Ratio    Anion Gap 12.9 7.0 - 18.0 mMol/L    BUN/Creatinine Ratio 24.6 10.0 - 30.0 Ratio    EGFR >60 mL/min/1.69m2    Osmolality Calc 286 275 - 300 mOsm/kg    Globulin 4.0 2.0 - 4.0 gm/dL   Dextrose Stick Glucose    Collection Time: 03/11/15  8:16 AM   Result Value Ref Range    Glucose, POCT 201 (H) 70 - 99 mg/dL   Dextrose Stick Glucose    Collection Time: 03/11/15 12:17 PM   Result Value Ref Range    Glucose, POCT 259 (H) 70 - 99 mg/dL   Dextrose Stick Glucose    Collection Time: 03/11/15  5:07 PM   Result Value Ref Range    Glucose, POCT 201 (H) 70 - 99 mg/dL   Dextrose Stick Glucose    Collection Time: 03/11/15  7:58 PM   Result Value Ref Range    Glucose, POCT 218 (H) 70 - 99 mg/dL   Dextrose Stick Glucose    Collection Time: 03/12/15  6:57 AM   Result Value Ref Range    Glucose, POCT 161 (H) 70 - 99 mg/dL   Dextrose Stick Glucose    Collection Time: 03/12/15  4:56 PM   Result Value Ref Range    Glucose, POCT 166 (H) 70 - 99 mg/dL   ECG 12 lead    Collection Time: 03/12/15  5:14 PM   Result Value Ref Range    Patient Age 50 years    Patient DOB 03-Oct-1965     Patient Height      Patient Weight      Interpretation Text       Sinus rhythm  No previous ECG available for comparison    Electronically Signed On 03-12-2015 23:12:28 EST by Delice Bison      Physician Interpreter Delice Bison  Ventricular Rate 97 //min    QRS Duration 89 ms    P-R Interval 141 ms    Q-T Interval 345 ms    Q-T Interval(Corrected) 439 ms    P Wave Axis 1 deg    QRS Axis 27 deg    T Axis 35 years   Dextrose Stick Glucose    Collection Time: 03/12/15  7:10 PM   Result Value Ref Range    Glucose, POCT 159 (H) 70 - 99 mg/dL   CBC with differential    Collection Time: 03/13/15  5:29 AM   Result Value Ref Range    WBC 10.0 4.0 - 11.0 K/cmm    RBC 3.67 (L) 4.00 - 5.70 M/cmm    Hemoglobin 11.5 (L) 13.0 - 17.5 gm/dL     Hematocrit 34.0 (L) 39.0 - 52.5 %    MCV 92 80 - 100 fL    MCH 31 28 - 35 pg    MCHC 34 32 - 36 gm/dL    RDW 11.9 11.0 - 14.0 %    PLT CT 246 130 - 440 K/cmm    MPV 9.0 6.0 - 10.0 fL    NEUTROPHIL % 76.6 42.0 - 78.0 %    Lymphocytes 13.8 (L) 15.0 - 46.0 %    Monocytes 7.8 3.0 - 15.0 %    Eosinophils % 1.3 0.0 - 7.0 %    Basophils % 0.6 0.0 - 3.0 %    Neutrophils Absolute 7.7 1.7 - 8.6 K/cmm    Lymphocytes Absolute 1.4 0.6 - 5.1 K/cmm    Monocytes Absolute 0.8 0.1 - 1.7 K/cmm    Eosinophils Absolute 0.1 0.0 - 0.8 K/cmm    BASO Absolute 0.1 0.0 - 0.3 K/cmm   Comprehensive metabolic panel    Collection Time: 03/13/15  5:29 AM   Result Value Ref Range    Sodium 134 (L) 136 - 147 mMol/L    Potassium 4.3 3.5 - 5.3 mMol/L    Chloride 103 98 - 110 mMol/L    CO2 23.7 20.0 - 30.0 mMol/L    Calcium 8.4 (L) 8.5 - 10.5 mg/dL    Glucose 264 (H) 70 - 99 mg/dL    Creatinine 1.06 0.80 - 1.30 mg/dL    BUN 21 7 - 22 mg/dL    Protein, Total 5.9 (L) 6.0 - 8.3 gm/dL    Albumin 2.0 (L) 3.5 - 5.0 gm/dL    Alkaline Phosphatase 154 (H) 40 - 145 U/L    ALT 20 0 - 55 U/L    AST (SGOT) 16 10 - 42 U/L    Bilirubin, Total 0.4 0.1 - 1.2 mg/dL    Albumin/Globulin Ratio 0.51 (L) 0.70 - 1.50 Ratio    Anion Gap 11.6 7.0 - 18.0 mMol/L    BUN/Creatinine Ratio 19.8 10.0 - 30.0 Ratio    EGFR >60 mL/min/1.75m2    Osmolality Calc 280 275 - 300 mOsm/kg    Globulin 3.9 2.0 - 4.0 gm/dL   Hemoglobin A1C    Collection Time: 03/13/15  5:29 AM   Result Value Ref Range    Hgb A1C, % 9.3 %   Hepatic function panel (LFT)    Collection Time: 03/13/15  5:29 AM   Result Value Ref Range    Protein, Total 5.9 (L) 6.0 - 8.3 gm/dL    Albumin 2.0 (L) 3.5 - 5.0 gm/dL    Alkaline Phosphatase 137 40 - 145 U/L    ALT 21 0 - 55 U/L  AST (SGOT) 11 10 - 42 U/L    Bilirubin, Total 0.3 0.1 - 1.2 mg/dL    Bilirubin, Direct 0.2 0.0 - 0.3 mg/dL    Albumin/Globulin Ratio 0.51 (L) 0.70 - 1.50 Ratio    Globulin 3.9 2.0 - 4.0 gm/dL   Lactic acid, plasma    Collection Time: 03/13/15  7:00  AM   Result Value Ref Range    Lactic acid 0.8 0.5 - 2.1 mMol/L   Prothrombin time/INR    Collection Time: 03/13/15  7:00 AM   Result Value Ref Range    PT 10.9 9.5 - 11.5 sec    PT INR 1.0 0.5 - 1.3   APTT    Collection Time: 03/13/15  7:00 AM   Result Value Ref Range    aPTT 32.3 24.0 - 34.0 sec   Dextrose Stick Glucose    Collection Time: 03/13/15  7:20 AM   Result Value Ref Range    Glucose, POCT 241 (H) 70 - 99 mg/dL   Dextrose Stick Glucose    Collection Time: 03/13/15 11:25 AM   Result Value Ref Range    Glucose, POCT 230 (H) 70 - 99 mg/dL   Vancomycin, Trough    Collection Time: 03/13/15  2:05 PM   Result Value Ref Range    Vancomycin Trough 12.83 10.00 - 20.00 mcg/mL   Dextrose Stick Glucose    Collection Time: 03/13/15  4:51 PM   Result Value Ref Range    Glucose, POCT 204 (H) 70 - 99 mg/dL   Dextrose Stick Glucose    Collection Time: 03/13/15  8:17 PM   Result Value Ref Range    Glucose, POCT 210 (H) 70 - 99 mg/dL   Creatinine, Serum    Collection Time: 03/14/15  3:53 AM   Result Value Ref Range    Creatinine 1.00 0.80 - 1.30 mg/dL    EGFR >60 mL/min/1.81m2   Dextrose Stick Glucose    Collection Time: 03/14/15  6:50 AM   Result Value Ref Range    Glucose, POCT 139 (H) 70 - 99 mg/dL   Dextrose Stick Glucose    Collection Time: 03/14/15 11:39 AM   Result Value Ref Range    Glucose, POCT 193 (H) 70 - 99 mg/dL   Dextrose Stick Glucose    Collection Time: 03/14/15  4:24 PM   Result Value Ref Range    Glucose, POCT 210 (H) 70 - 99 mg/dL   Dextrose Stick Glucose    Collection Time: 03/14/15  8:18 PM   Result Value Ref Range    Glucose, POCT 216 (H) 70 - 99 mg/dL   Creatinine, Serum    Collection Time: 03/15/15  5:26 AM   Result Value Ref Range    Creatinine 0.87 0.80 - 1.30 mg/dL    EGFR >60 mL/min/1.28m2   Dextrose Stick Glucose    Collection Time: 03/15/15  6:51 AM   Result Value Ref Range    Glucose, POCT 205 (H) 70 - 99 mg/dL   Dextrose Stick Glucose    Collection Time: 03/15/15 11:14 AM   Result Value Ref  Range    Glucose, POCT 186 (H) 70 - 99 mg/dL   Dextrose Stick Glucose    Collection Time: 03/15/15  4:05 PM   Result Value Ref Range    Glucose, POCT 222 (H) 70 - 99 mg/dL   Dextrose Stick Glucose    Collection Time: 03/15/15  9:40 PM   Result Value Ref Range  Glucose, POCT 131 (H) 70 - 99 mg/dL   Dextrose Stick Glucose    Collection Time: 03/16/15  7:22 AM   Result Value Ref Range    Glucose, POCT 123 (H) 70 - 99 mg/dL   Dextrose Stick Glucose    Collection Time: 03/16/15 11:22 AM   Result Value Ref Range    Glucose, POCT 135 (H) 70 - 99 mg/dL   Dextrose Stick Glucose    Collection Time: 03/16/15  4:13 PM   Result Value Ref Range    Glucose, POCT 190 (H) 70 - 99 mg/dL   Dextrose Stick Glucose    Collection Time: 03/16/15  9:14 PM   Result Value Ref Range    Glucose, POCT 100 (H) 70 - 99 mg/dL   Dextrose Stick Glucose    Collection Time: 03/17/15  7:23 AM   Result Value Ref Range    Glucose, POCT 149 (H) 70 - 99 mg/dL   Dextrose Stick Glucose    Collection Time: 03/17/15 10:46 AM   Result Value Ref Range    Glucose, POCT 144 (H) 70 - 99 mg/dL        Assessment:     1. Osteomyelitis of left foot, unspecified chronicity    2. Type 2 diabetes mellitus with diabetic polyneuropathy, with long-term current use of insulin    3. Microalbuminuria         Plan:   No orders of the defined types were placed in this encounter.        Requested Prescriptions      No prescriptions requested or ordered in this encounter       Return in about 3 months (around 06/20/2015).    Patient Instructions   1. Continue your Lantus, NovoLog, sliding scale, and metformin.  Diet should be green vegetables and low-fat meats.  Consistent carbohydrate intake.  2. Your blood pressure today looks good  3. Wound care per Dr.  Evette Doffing         Patient was instructed to notify me or the ER if they experience problems.    Discussed with patient all diagnostics and consults ordered and addressed patient concerns prior to the completion of the visit.   Patient will be notified of results when they become available.  Encouraged to call for results if no contact within 14 days.     The patient's electronic medical record was reviewed, any changes in the past medical history, past surgical history, medications, diagnostic tests were noted, and the record was updated accordingly.    This note was completed using Visual [employer]. Grammatical errors, random word insertions, pronoun errors and incomplete sentences are therefore possible due to software limitations. If you have questions or concerns regarding these errors or this dictation, please address them with the provider for clarification.     Discussed option available for my chart which provides electronic access to diagnostic results.        Signed by: Rhunette Croft, MD

## 2015-03-23 NOTE — Patient Instructions (Signed)
1. Continue your Lantus, NovoLog, sliding scale, and metformin.  Diet should be green vegetables and low-fat meats.  Consistent carbohydrate intake.  2. Your blood pressure today looks good  3. Wound care per Dr.  Evette Doffing

## 2015-03-24 ENCOUNTER — Other Ambulatory Visit
Admission: RE | Admit: 2015-03-24 | Discharge: 2015-03-24 | Disposition: A | Discharge status: Home / Self Care | Payer: BC Managed Care – PPO | Source: Ambulatory Visit | Attending: Internal Medicine | Admitting: Internal Medicine

## 2015-03-24 DIAGNOSIS — E11621 Type 2 diabetes mellitus with foot ulcer: Secondary | ICD-10-CM | POA: Insufficient documentation

## 2015-03-24 DIAGNOSIS — M869 Osteomyelitis, unspecified: Secondary | ICD-10-CM | POA: Insufficient documentation

## 2015-03-24 LAB — COMPREHENSIVE METABOLIC PANEL
ALT: 14 U/L (ref 0–55)
AST (SGOT): 18 U/L (ref 10–42)
Albumin/Globulin Ratio: 0.7 Ratio (ref 0.70–1.50)
Albumin: 2.6 gm/dL — ABNORMAL LOW (ref 3.5–5.0)
Alkaline Phosphatase: 93 U/L (ref 40–145)
Anion Gap: 12.9 mMol/L (ref 7.0–18.0)
BUN / Creatinine Ratio: 25 Ratio (ref 10.0–30.0)
BUN: 21 mg/dL (ref 7–22)
Bilirubin, Total: 0.1 mg/dL (ref 0.1–1.2)
CO2: 24.5 mMol/L (ref 20.0–30.0)
Calcium: 8.7 mg/dL (ref 8.5–10.5)
Chloride: 107 mMol/L (ref 98–110)
Creatinine: 0.84 mg/dL (ref 0.80–1.30)
EGFR: >60 mL/min/{1.73_m2}
Globulin: 3.8 gm/dL (ref 2.0–4.0)
Glucose: 103 mg/dL — ABNORMAL HIGH (ref 70–99)
Osmolality Calc: 283 mOsm/kg (ref 275–300)
Potassium: 4.3 mMol/L (ref 3.5–5.3)
Protein, Total: 6.4 gm/dL (ref 6.0–8.3)
Sodium: 140 mMol/L (ref 136–147)

## 2015-03-24 LAB — SEDIMENTATION RATE: Sed Rate: 69 mm/hr — ABNORMAL HIGH (ref 0–20)

## 2015-03-24 LAB — CBC AND DIFFERENTIAL
Basophils %: 0.6 % (ref 0.0–3.0)
Basophils Absolute: 0.1 10*3/uL (ref 0.0–0.3)
Eosinophils %: 2.5 % (ref 0.0–7.0)
Eosinophils Absolute: 0.3 10*3/uL (ref 0.0–0.8)
Hematocrit: 35.5 % — ABNORMAL LOW (ref 39.0–52.5)
Hemoglobin: 12.1 gm/dL — ABNORMAL LOW (ref 13.0–17.5)
Lymphocytes Absolute: 2.5 10*3/uL (ref 0.6–5.1)
Lymphocytes: 23 % (ref 15.0–46.0)
MCH: 30 pg (ref 28–35)
MCHC: 34 gm/dL (ref 31–36)
MCV: 88 fL (ref 80–100)
MPV: 8.3 fL (ref 6.0–10.0)
Monocytes Absolute: 0.6 10*3/uL (ref 0.1–1.7)
Monocytes: 5.7 % (ref 3.0–15.0)
Neutrophils %: 68.2 % (ref 42.0–78.0)
Neutrophils Absolute: 7.5 10*3/uL (ref 1.7–8.6)
PLT CT: 289 10*3/uL (ref 130–440)
RBC: 4.06 10*6/uL (ref 4.00–5.70)
RDW: 12.3 % (ref 10.5–14.5)
WBC: 11.1 10*3/uL — ABNORMAL HIGH (ref 4.00–11.00)

## 2015-03-24 LAB — C-REACTIVE PROTEIN: C-Reactive Protein: 0.4 mg/dL (ref 0.02–0.80)

## 2015-03-31 ENCOUNTER — Other Ambulatory Visit
Admission: RE | Admit: 2015-03-31 | Discharge: 2015-03-31 | Disposition: A | Discharge status: Home / Self Care | Payer: BC Managed Care – PPO | Source: Ambulatory Visit | Attending: Internal Medicine | Admitting: Internal Medicine

## 2015-03-31 DIAGNOSIS — L97529 Non-pressure chronic ulcer of other part of left foot with unspecified severity: Secondary | ICD-10-CM | POA: Insufficient documentation

## 2015-03-31 DIAGNOSIS — E11621 Type 2 diabetes mellitus with foot ulcer: Secondary | ICD-10-CM | POA: Insufficient documentation

## 2015-03-31 LAB — CBC AND DIFFERENTIAL
Basophils %: 0.6 % (ref 0.0–3.0)
Basophils Absolute: 0 10*3/uL (ref 0.0–0.3)
Eosinophils %: 3 % (ref 0.0–7.0)
Eosinophils Absolute: 0.3 10*3/uL (ref 0.0–0.8)
Hematocrit: 38 % — ABNORMAL LOW (ref 39.0–52.5)
Hemoglobin: 12.8 gm/dL — ABNORMAL LOW (ref 13.0–17.5)
Lymphocytes Absolute: 1.8 10*3/uL (ref 0.6–5.1)
Lymphocytes: 19.7 % (ref 15.0–46.0)
MCH: 29 pg (ref 28–35)
MCHC: 34 gm/dL (ref 31–36)
MCV: 87 fL (ref 80–100)
MPV: 9.1 fL (ref 6.0–10.0)
Monocytes Absolute: 0.5 10*3/uL (ref 0.1–1.7)
Monocytes: 5.4 % (ref 3.0–15.0)
Neutrophils %: 71.3 % (ref 42.0–78.0)
Neutrophils Absolute: 6.4 10*3/uL (ref 1.7–8.6)
PLT CT: 205 10*3/uL (ref 130–440)
RBC: 4.37 10*6/uL (ref 4.00–5.70)
RDW: 12.5 % (ref 10.5–14.5)
WBC: 9 10*3/uL (ref 4.00–11.00)

## 2015-03-31 LAB — COMPREHENSIVE METABOLIC PANEL
ALT: 13 U/L (ref 0–55)
AST (SGOT): 15 U/L (ref 10–42)
Albumin/Globulin Ratio: 0.79 Ratio (ref 0.70–1.50)
Albumin: 3 gm/dL — ABNORMAL LOW (ref 3.5–5.0)
Alkaline Phosphatase: 80 U/L (ref 40–145)
Anion Gap: 13 mMol/L (ref 7.0–18.0)
BUN / Creatinine Ratio: 23.9 Ratio (ref 10.0–30.0)
BUN: 21 mg/dL (ref 7–22)
Bilirubin, Total: 0.2 mg/dL (ref 0.1–1.2)
CO2: 24.4 mMol/L (ref 20.0–30.0)
Calcium: 8.9 mg/dL (ref 8.5–10.5)
Chloride: 105 mMol/L (ref 98–110)
Creatinine: 0.88 mg/dL (ref 0.80–1.30)
EGFR: >60 mL/min/{1.73_m2}
Globulin: 3.7 gm/dL (ref 2.0–4.0)
Glucose: 252 mg/dL — ABNORMAL HIGH (ref 70–99)
Osmolality Calc: 287 mOsm/kg (ref 275–300)
Potassium: 4.4 mMol/L (ref 3.5–5.3)
Protein, Total: 6.7 gm/dL (ref 6.0–8.3)
Sodium: 138 mMol/L (ref 136–147)

## 2015-03-31 LAB — PHOSPHORUS: Phosphorus: 3.2 mg/dL (ref 2.3–4.7)

## 2015-03-31 LAB — MAGNESIUM: Magnesium: 1.6 mg/dL (ref 1.6–2.6)

## 2015-03-31 LAB — TRIGLYCERIDES: Triglycerides: 294 mg/dL — ABNORMAL HIGH (ref 10–150)

## 2015-04-07 ENCOUNTER — Other Ambulatory Visit
Admission: RE | Admit: 2015-04-07 | Discharge: 2015-04-07 | Disposition: A | Discharge status: Home / Self Care | Payer: BC Managed Care – PPO | Source: Ambulatory Visit | Attending: Internal Medicine | Admitting: Internal Medicine

## 2015-04-07 DIAGNOSIS — M869 Osteomyelitis, unspecified: Secondary | ICD-10-CM | POA: Insufficient documentation

## 2015-04-07 DIAGNOSIS — L97529 Non-pressure chronic ulcer of other part of left foot with unspecified severity: Secondary | ICD-10-CM | POA: Insufficient documentation

## 2015-04-07 DIAGNOSIS — E11621 Type 2 diabetes mellitus with foot ulcer: Secondary | ICD-10-CM | POA: Insufficient documentation

## 2015-04-07 LAB — CBC AND DIFFERENTIAL
Basophils %: 0.4 % (ref 0.0–3.0)
Basophils Absolute: 0 10*3/uL (ref 0.0–0.3)
Eosinophils %: 5 % (ref 0.0–7.0)
Eosinophils Absolute: 0.4 10*3/uL (ref 0.0–0.8)
Hematocrit: 37 % — ABNORMAL LOW (ref 39.0–52.5)
Hemoglobin: 12.8 gm/dL — ABNORMAL LOW (ref 13.0–17.5)
Lymphocytes Absolute: 1.8 10*3/uL (ref 0.6–5.1)
Lymphocytes: 23.4 % (ref 15.0–46.0)
MCH: 30 pg (ref 28–35)
MCHC: 35 gm/dL (ref 31–36)
MCV: 88 fL (ref 80–100)
MPV: 8.5 fL (ref 6.0–10.0)
Monocytes Absolute: 0.5 10*3/uL (ref 0.1–1.7)
Monocytes: 6.9 % (ref 3.0–15.0)
Neutrophils %: 64.3 % (ref 42.0–78.0)
Neutrophils Absolute: 4.9 10*3/uL (ref 1.7–8.6)
PLT CT: 161 10*3/uL (ref 130–440)
RBC: 4.21 10*6/uL (ref 4.00–5.70)
RDW: 13.3 % (ref 10.5–14.5)
WBC: 7.6 10*3/uL (ref 4.00–11.00)

## 2015-04-07 LAB — C-REACTIVE PROTEIN: C-Reactive Protein: 0.09 mg/dL (ref 0.02–0.80)

## 2015-04-07 LAB — SEDIMENTATION RATE: Sed Rate: 34 mm/hr — ABNORMAL HIGH (ref 0–20)

## 2015-04-14 ENCOUNTER — Other Ambulatory Visit
Admission: RE | Admit: 2015-04-14 | Discharge: 2015-04-14 | Disposition: A | Discharge status: Home / Self Care | Payer: BC Managed Care – PPO | Source: Ambulatory Visit | Attending: Internal Medicine | Admitting: Internal Medicine

## 2015-04-14 DIAGNOSIS — L97529 Non-pressure chronic ulcer of other part of left foot with unspecified severity: Secondary | ICD-10-CM | POA: Insufficient documentation

## 2015-04-14 DIAGNOSIS — E11621 Type 2 diabetes mellitus with foot ulcer: Secondary | ICD-10-CM | POA: Insufficient documentation

## 2015-04-14 DIAGNOSIS — M869 Osteomyelitis, unspecified: Secondary | ICD-10-CM | POA: Insufficient documentation

## 2015-04-14 LAB — COMPREHENSIVE METABOLIC PANEL
ALT: 11 U/L (ref 0–55)
AST (SGOT): 18 U/L (ref 10–42)
Albumin/Globulin Ratio: 0.89 Ratio (ref 0.70–1.50)
Albumin: 3 gm/dL — ABNORMAL LOW (ref 3.5–5.0)
Alkaline Phosphatase: 75 U/L (ref 40–145)
Anion Gap: 13.8 mMol/L (ref 7.0–18.0)
BUN / Creatinine Ratio: 17.5 Ratio (ref 10.0–30.0)
BUN: 17 mg/dL (ref 7–22)
Bilirubin, Total: 0.2 mg/dL (ref 0.1–1.2)
CO2: 25.9 mMol/L (ref 20.0–30.0)
Calcium: 8.7 mg/dL (ref 8.5–10.5)
Chloride: 105 mMol/L (ref 98–110)
Creatinine: 0.97 mg/dL (ref 0.80–1.30)
EGFR: >60 mL/min/{1.73_m2}
Globulin: 3.3 gm/dL (ref 2.0–4.0)
Glucose: 222 mg/dL — ABNORMAL HIGH (ref 70–99)
Osmolality Calc: 288 mOsm/kg (ref 275–300)
Potassium: 4.5 mMol/L (ref 3.5–5.3)
Protein, Total: 6.3 gm/dL (ref 6.0–8.3)
Sodium: 140 mMol/L (ref 136–147)

## 2015-04-14 LAB — CBC AND DIFFERENTIAL
Basophils %: 0.9 % (ref 0.0–3.0)
Basophils Absolute: 0.1 10*3/uL (ref 0.0–0.3)
Eosinophils %: 3.6 % (ref 0.0–7.0)
Eosinophils Absolute: 0.3 10*3/uL (ref 0.0–0.8)
Hematocrit: 38 % — ABNORMAL LOW (ref 39.0–52.5)
Hemoglobin: 13.2 gm/dL (ref 13.0–17.5)
Lymphocytes Absolute: 2 10*3/uL (ref 0.6–5.1)
Lymphocytes: 25.7 % (ref 15.0–46.0)
MCH: 30 pg (ref 28–35)
MCHC: 35 gm/dL (ref 31–36)
MCV: 87 fL (ref 80–100)
MPV: 9.1 fL (ref 6.0–10.0)
Monocytes Absolute: 0.5 10*3/uL (ref 0.1–1.7)
Monocytes: 6.9 % (ref 3.0–15.0)
Neutrophils %: 62.8 % (ref 42.0–78.0)
Neutrophils Absolute: 5 10*3/uL (ref 1.7–8.6)
PLT CT: 161 10*3/uL (ref 130–440)
RBC: 4.37 10*6/uL (ref 4.00–5.70)
RDW: 13.2 % (ref 10.5–14.5)
WBC: 7.9 10*3/uL (ref 4.00–11.00)

## 2015-04-14 LAB — SEDIMENTATION RATE: Sed Rate: 34 mm/hr — ABNORMAL HIGH (ref 0–20)

## 2015-04-14 LAB — C-REACTIVE PROTEIN: C-Reactive Protein: 0.12 mg/dL (ref 0.02–0.80)

## 2015-04-21 ENCOUNTER — Other Ambulatory Visit
Admission: RE | Admit: 2015-04-21 | Discharge: 2015-04-21 | Disposition: A | Discharge status: Home / Self Care | Payer: BC Managed Care – PPO | Source: Ambulatory Visit | Attending: Foot & Ankle Surgery | Admitting: Foot & Ankle Surgery

## 2015-04-21 DIAGNOSIS — E11621 Type 2 diabetes mellitus with foot ulcer: Secondary | ICD-10-CM | POA: Insufficient documentation

## 2015-04-21 DIAGNOSIS — R69 Illness, unspecified: Secondary | ICD-10-CM | POA: Insufficient documentation

## 2015-04-21 DIAGNOSIS — L97529 Non-pressure chronic ulcer of other part of left foot with unspecified severity: Secondary | ICD-10-CM | POA: Insufficient documentation

## 2015-04-21 DIAGNOSIS — Z5329 Procedure and treatment not carried out because of patient's decision for other reasons: Secondary | ICD-10-CM | POA: Insufficient documentation

## 2015-04-21 LAB — COMPREHENSIVE METABOLIC PANEL
ALT: 12 U/L (ref 0–55)
AST (SGOT): 18 U/L (ref 10–42)
Albumin/Globulin Ratio: 0.99 Ratio (ref 0.70–1.50)
Albumin: 3.1 gm/dL — ABNORMAL LOW (ref 3.5–5.0)
Alkaline Phosphatase: 80 U/L (ref 40–145)
Anion Gap: 15.6 mMol/L (ref 7.0–18.0)
BUN / Creatinine Ratio: 31 Ratio — ABNORMAL HIGH (ref 10.0–30.0)
BUN: 27 mg/dL — ABNORMAL HIGH (ref 7–22)
Bilirubin, Total: 0.2 mg/dL (ref 0.1–1.2)
CO2: 24.9 mMol/L (ref 20.0–30.0)
Calcium: 8.9 mg/dL (ref 8.5–10.5)
Chloride: 103 mMol/L (ref 98–110)
Creatinine: 0.87 mg/dL (ref 0.80–1.30)
EGFR: >60 mL/min/{1.73_m2}
Globulin: 3.2 gm/dL (ref 2.0–4.0)
Glucose: 163 mg/dL — ABNORMAL HIGH (ref 70–99)
Osmolality Calc: 287 mOsm/kg (ref 275–300)
Potassium: 4.6 mMol/L (ref 3.5–5.3)
Protein, Total: 6.3 gm/dL (ref 6.0–8.3)
Sodium: 139 mMol/L (ref 136–147)

## 2015-04-21 LAB — CBC AND DIFFERENTIAL
Basophils %: 0.8 % (ref 0.0–3.0)
Basophils Absolute: 0.1 10*3/uL (ref 0.0–0.3)
Eosinophils %: 3.1 % (ref 0.0–7.0)
Eosinophils Absolute: 0.3 10*3/uL (ref 0.0–0.8)
Hematocrit: 37.9 % — ABNORMAL LOW (ref 39.0–52.5)
Hemoglobin: 13.5 gm/dL (ref 13.0–17.5)
Lymphocytes Absolute: 2.3 10*3/uL (ref 0.6–5.1)
Lymphocytes: 26 % (ref 15.0–46.0)
MCH: 31 pg (ref 28–35)
MCHC: 36 gm/dL (ref 31–36)
MCV: 86 fL (ref 80–100)
MPV: 8.7 fL (ref 6.0–10.0)
Monocytes Absolute: 0.6 10*3/uL (ref 0.1–1.7)
Monocytes: 6.5 % (ref 3.0–15.0)
Neutrophils %: 63.5 % (ref 42.0–78.0)
Neutrophils Absolute: 5.5 10*3/uL (ref 1.7–8.6)
PLT CT: 160 10*3/uL (ref 130–440)
RBC: 4.4 10*6/uL (ref 4.00–5.70)
RDW: 12.8 % (ref 10.5–14.5)
WBC: 8.7 10*3/uL (ref 4.00–11.00)

## 2015-04-21 LAB — SEDIMENTATION RATE: Sed Rate: 42 mm/hr — ABNORMAL HIGH (ref 0–20)

## 2015-04-21 LAB — C-REACTIVE PROTEIN: C-Reactive Protein: 0.17 mg/dL (ref 0.02–0.80)

## 2015-04-28 ENCOUNTER — Telehealth (INDEPENDENT_AMBULATORY_CARE_PROVIDER_SITE_OTHER): Payer: Self-pay

## 2015-04-28 NOTE — Telephone Encounter (Signed)
Patient would like smaller needles for insulin.    Please advise

## 2015-04-29 NOTE — Telephone Encounter (Signed)
Please send appropriate needles per pt request

## 2015-04-30 ENCOUNTER — Ambulatory Visit
Admission: RE | Admit: 2015-04-30 | Discharge: 2015-04-30 | Disposition: A | Discharge status: Home / Self Care | Payer: BC Managed Care – PPO | Source: Ambulatory Visit | Attending: Foot & Ankle Surgery | Admitting: Foot & Ankle Surgery

## 2015-04-30 DIAGNOSIS — Z01818 Encounter for other preprocedural examination: Secondary | ICD-10-CM | POA: Insufficient documentation

## 2015-04-30 LAB — CBC AND DIFFERENTIAL
Basophils %: 0.8 % (ref 0.0–3.0)
Basophils Absolute: 0.1 10*3/uL (ref 0.0–0.3)
Eosinophils %: 3.2 % (ref 0.0–7.0)
Eosinophils Absolute: 0.3 10*3/uL (ref 0.0–0.8)
Hematocrit: 40.8 % (ref 39.0–52.5)
Hemoglobin: 13.6 gm/dL (ref 13.0–17.5)
Lymphocytes Absolute: 1.9 10*3/uL (ref 0.6–5.1)
Lymphocytes: 23.8 % (ref 15.0–46.0)
MCH: 30 pg (ref 28–35)
MCHC: 33 gm/dL (ref 32–36)
MCV: 90 fL (ref 80–100)
MPV: 9.8 fL (ref 6.0–10.0)
Monocytes Absolute: 0.6 10*3/uL (ref 0.1–1.7)
Monocytes: 7.4 % (ref 3.0–15.0)
Neutrophils %: 64.8 % (ref 42.0–78.0)
Neutrophils Absolute: 5.1 10*3/uL (ref 1.7–8.6)
PLT CT: 166 10*3/uL (ref 130–440)
RBC: 4.51 10*6/uL (ref 4.00–5.70)
RDW: 13.3 % (ref 11.0–14.0)
WBC: 7.9 10*3/uL (ref 4.0–11.0)

## 2015-04-30 LAB — BASIC METABOLIC PANEL
Anion Gap: 14.2 mMol/L (ref 7.0–18.0)
BUN / Creatinine Ratio: 23.8 Ratio (ref 10.0–30.0)
BUN: 25 mg/dL — ABNORMAL HIGH (ref 7–22)
CO2: 24.6 mMol/L (ref 20.0–30.0)
Calcium: 9 mg/dL (ref 8.5–10.5)
Chloride: 103 mMol/L (ref 98–110)
Creatinine: 1.05 mg/dL (ref 0.80–1.30)
EGFR: >60 mL/min/{1.73_m2}
Glucose: 250 mg/dL — ABNORMAL HIGH (ref 70–99)
Osmolality Calc: 287 mOsm/kg (ref 275–300)
Potassium: 4.8 mMol/L (ref 3.5–5.3)
Sodium: 137 mMol/L (ref 136–147)

## 2015-04-30 LAB — PT AND APTT
PT INR: 0.9 (ref 0.5–1.3)
PT: 10 s (ref 9.5–11.5)
aPTT: 26 s (ref 24.0–34.0)

## 2015-05-01 ENCOUNTER — Encounter (AMBULATORY_SURGERY_CENTER): Payer: Self-pay

## 2015-05-04 ENCOUNTER — Ambulatory Visit: Payer: BC Managed Care – PPO

## 2015-05-04 ENCOUNTER — Ambulatory Visit
Admission: RE | Admit: 2015-05-04 | Discharge: 2015-05-04 | Disposition: A | Discharge status: Home / Self Care | Payer: BC Managed Care – PPO | Source: Ambulatory Visit | Attending: Foot & Ankle Surgery | Admitting: Foot & Ankle Surgery

## 2015-05-04 ENCOUNTER — Ambulatory Visit (AMBULATORY_SURGERY_CENTER): Payer: BC Managed Care – PPO | Admitting: Certified Registered"

## 2015-05-04 ENCOUNTER — Encounter (AMBULATORY_SURGERY_CENTER)
Admission: RE | Disposition: A | Discharge status: Home / Self Care | Payer: Self-pay | Source: Ambulatory Visit | Attending: Foot & Ankle Surgery

## 2015-05-04 ENCOUNTER — Ambulatory Visit (AMBULATORY_SURGERY_CENTER): Payer: BC Managed Care – PPO | Admitting: Foot & Ankle Surgery

## 2015-05-04 ENCOUNTER — Encounter (AMBULATORY_SURGERY_CENTER): Payer: Self-pay

## 2015-05-04 DIAGNOSIS — Z794 Long term (current) use of insulin: Secondary | ICD-10-CM | POA: Insufficient documentation

## 2015-05-04 DIAGNOSIS — Z8249 Family history of ischemic heart disease and other diseases of the circulatory system: Secondary | ICD-10-CM | POA: Insufficient documentation

## 2015-05-04 DIAGNOSIS — E785 Hyperlipidemia, unspecified: Secondary | ICD-10-CM | POA: Insufficient documentation

## 2015-05-04 DIAGNOSIS — E11621 Type 2 diabetes mellitus with foot ulcer: Secondary | ICD-10-CM | POA: Insufficient documentation

## 2015-05-04 DIAGNOSIS — M8618 Other acute osteomyelitis, other site: Secondary | ICD-10-CM

## 2015-05-04 DIAGNOSIS — Z887 Allergy status to serum and vaccine status: Secondary | ICD-10-CM | POA: Insufficient documentation

## 2015-05-04 DIAGNOSIS — Z825 Family history of asthma and other chronic lower respiratory diseases: Secondary | ICD-10-CM | POA: Insufficient documentation

## 2015-05-04 DIAGNOSIS — Z7984 Long term (current) use of oral hypoglycemic drugs: Secondary | ICD-10-CM | POA: Insufficient documentation

## 2015-05-04 DIAGNOSIS — Z7982 Long term (current) use of aspirin: Secondary | ICD-10-CM | POA: Insufficient documentation

## 2015-05-04 DIAGNOSIS — Z9889 Other specified postprocedural states: Secondary | ICD-10-CM

## 2015-05-04 DIAGNOSIS — G4730 Sleep apnea, unspecified: Secondary | ICD-10-CM | POA: Insufficient documentation

## 2015-05-04 DIAGNOSIS — K219 Gastro-esophageal reflux disease without esophagitis: Secondary | ICD-10-CM | POA: Insufficient documentation

## 2015-05-04 DIAGNOSIS — M869 Osteomyelitis, unspecified: Secondary | ICD-10-CM | POA: Insufficient documentation

## 2015-05-04 DIAGNOSIS — I10 Essential (primary) hypertension: Secondary | ICD-10-CM | POA: Insufficient documentation

## 2015-05-04 DIAGNOSIS — E1169 Type 2 diabetes mellitus with other specified complication: Secondary | ICD-10-CM | POA: Insufficient documentation

## 2015-05-04 DIAGNOSIS — E1143 Type 2 diabetes mellitus with diabetic autonomic (poly)neuropathy: Secondary | ICD-10-CM | POA: Insufficient documentation

## 2015-05-04 DIAGNOSIS — Z818 Family history of other mental and behavioral disorders: Secondary | ICD-10-CM | POA: Insufficient documentation

## 2015-05-04 DIAGNOSIS — Z833 Family history of diabetes mellitus: Secondary | ICD-10-CM | POA: Insufficient documentation

## 2015-05-04 HISTORY — DX: Other complications of anesthesia, initial encounter: T88.59XA

## 2015-05-04 HISTORY — DX: Sleep apnea, unspecified: G47.30

## 2015-05-04 HISTORY — DX: Gastro-esophageal reflux disease without esophagitis: K21.9

## 2015-05-04 HISTORY — PX: ARTHRODESIS, TOE: SHX3090

## 2015-05-04 HISTORY — DX: Type 2 diabetes mellitus without complications: E11.9

## 2015-05-04 HISTORY — DX: Hyperlipidemia, unspecified: E78.5

## 2015-05-04 LAB — VH DEXTROSE STICK GLUCOSE
Glucose POCT: 287 mg/dL — ABNORMAL HIGH (ref 70–99)
Glucose POCT: 276 mg/dL — ABNORMAL HIGH (ref 70–99)
Glucose POCT: 197 mg/dL — ABNORMAL HIGH (ref 70–99)

## 2015-05-04 SURGERY — ARTHRODESIS, TOE
Anesthesia: Anesthesia MAC / Sedation | Site: Toe | Laterality: Left | Wound class: Clean Contaminated

## 2015-05-04 MED ORDER — VANCOMYCIN HCL IN DEXTROSE 1-5 GM/200ML-% IV SOLN
1000.0000 mg | Freq: Two times a day (BID) | INTRAVENOUS | Status: DC
Start: 2015-05-04 — End: 2015-05-04

## 2015-05-04 MED ORDER — INSULIN REGULAR HUMAN 100 UNIT/ML IJ SOLN
2.0000 [IU] | Freq: Once | INTRAMUSCULAR | Status: DC
Start: 2015-05-04 — End: 2015-05-04

## 2015-05-04 MED ORDER — BACITRACIN ZINC 500 UNIT/GM EX OINT
TOPICAL_OINTMENT | CUTANEOUS | Status: DC | PRN
Start: 2015-05-04 — End: 2015-05-04
  Administered 2015-05-04: 1 via TOPICAL

## 2015-05-04 MED ORDER — SODIUM CHLORIDE 0.9 % IV SOLN
2.0000 g | INTRAVENOUS | Status: AC
Start: 2015-05-04 — End: 2015-05-04
  Administered 2015-05-04: 2 g via INTRAVENOUS
  Filled 2015-05-04: qty 2000

## 2015-05-04 MED ORDER — METOCLOPRAMIDE HCL 5 MG/ML IJ SOLN
10.0000 mg | Freq: Once | INTRAMUSCULAR | Status: DC | PRN
Start: 2015-05-04 — End: 2015-05-04

## 2015-05-04 MED ORDER — PROPOFOL 10 MG/ML IV EMUL (WRAP)
INTRAVENOUS | Status: AC
Start: 2015-05-04 — End: ?
  Filled 2015-05-04: qty 20

## 2015-05-04 MED ORDER — FENTANYL CITRATE (PF) 50 MCG/ML IJ SOLN (WRAP)
INTRAMUSCULAR | Status: DC | PRN
Start: 2015-05-04 — End: 2015-05-04
  Administered 2015-05-04: 50 ug via INTRAVENOUS
  Administered 2015-05-04 (×2): 25 ug via INTRAVENOUS

## 2015-05-04 MED ORDER — LIDOCAINE HCL (PF) 2 % IJ SOLN
INTRAMUSCULAR | Status: DC | PRN
Start: 2015-05-04 — End: 2015-05-04
  Administered 2015-05-04: 40 mg via INTRAVENOUS

## 2015-05-04 MED ORDER — MIDAZOLAM HCL 2 MG/2ML IJ SOLN
INTRAMUSCULAR | Status: AC
Start: 2015-05-04 — End: ?
  Filled 2015-05-04: qty 2

## 2015-05-04 MED ORDER — SODIUM CHLORIDE 0.9 % IJ SOLN
10.0000 mL | Freq: Every day | INTRAMUSCULAR | Status: DC
Start: 2015-05-04 — End: 2015-05-04
  Administered 2015-05-04: 10 mL

## 2015-05-04 MED ORDER — BUPIVACAINE-EPINEPHRINE (PF) 0.25% -1:200000 IJ SOLN
INTRAMUSCULAR | Status: DC | PRN
Start: 2015-05-04 — End: 2015-05-04
  Administered 2015-05-04: 2 mL via PERINEURAL

## 2015-05-04 MED ORDER — VH HEPARIN SODIUM (PORCINE) 5000 UNIT/ML IJ SOLN
5000.0000 [IU] | Freq: Once | INTRAMUSCULAR | Status: DC
Start: 2015-05-04 — End: 2015-05-04
  Filled 2015-05-04 (×2): qty 1

## 2015-05-04 MED ORDER — HEPARIN SODIUM (PORCINE) 5000 UNIT/ML IJ SOLN
INTRAMUSCULAR | Status: DC | PRN
Start: 2015-05-04 — End: 2015-05-04
  Administered 2015-05-04: 5000 [IU] via SUBCUTANEOUS

## 2015-05-04 MED ORDER — ACETAMINOPHEN 10 MG/ML IV SOLN
INTRAVENOUS | Status: DC | PRN
Start: 2015-05-04 — End: 2015-05-04
  Administered 2015-05-04: 1000 mg via INTRAVENOUS

## 2015-05-04 MED ORDER — HYDROCODONE-ACETAMINOPHEN 5-325 MG PO TABS
1.0000 | ORAL_TABLET | Freq: Four times a day (QID) | ORAL | Status: DC | PRN
Start: 2015-05-04 — End: 2015-07-09

## 2015-05-04 MED ORDER — LACTATED RINGERS IV SOLN
INTRAVENOUS | Status: DC
Start: 2015-05-04 — End: 2015-05-04

## 2015-05-04 MED ORDER — VH PROPOFOL INFUSION 10 MG/ML (WRAPPED)
INTRAVENOUS | Status: DC | PRN
Start: 2015-05-04 — End: 2015-05-04
  Administered 2015-05-04: 100 ug/kg/min via INTRAVENOUS

## 2015-05-04 MED ORDER — INSULIN REGULAR HUMAN 100 UNIT/ML IJ SOLN
2.0000 [IU] | Freq: Once | INTRAMUSCULAR | Status: AC
Start: 2015-05-04 — End: 2015-05-04
  Administered 2015-05-04: 2 [IU] via INTRAVENOUS

## 2015-05-04 MED ORDER — PROPOFOL 10 MG/ML IV EMUL (WRAP)
INTRAVENOUS | Status: DC | PRN
Start: 2015-05-04 — End: 2015-05-04
  Administered 2015-05-04: 50 mg via INTRAVENOUS
  Administered 2015-05-04: 40 mg via INTRAVENOUS
  Administered 2015-05-04: 20 mg via INTRAVENOUS

## 2015-05-04 MED ORDER — BUPIVACAINE HCL (PF) 0.5 % IJ SOLN
INTRAMUSCULAR | Status: DC | PRN
Start: 2015-05-04 — End: 2015-05-04
  Administered 2015-05-04: 10 mL

## 2015-05-04 MED ORDER — VH BIO-K PLUS PROBIOTIC 50 BIL CFU CAPSULE
50.0000 | DELAYED_RELEASE_CAPSULE | Freq: Every day | ORAL | Status: DC
Start: 2015-05-04 — End: 2015-05-04
  Administered 2015-05-04: 50 via ORAL
  Filled 2015-05-04 (×2): qty 1

## 2015-05-04 MED ORDER — ONDANSETRON HCL 4 MG/2ML IJ SOLN
4.0000 mg | Freq: Once | INTRAMUSCULAR | Status: DC | PRN
Start: 2015-05-04 — End: 2015-05-04

## 2015-05-04 MED ORDER — VH PHENYLEPHRINE 120 MCG/ML IV BOLUS (ANESTHESIA)
PREFILLED_SYRINGE | INTRAVENOUS | Status: DC | PRN
Start: 2015-05-04 — End: 2015-05-04
  Administered 2015-05-04: 180 ug via INTRAVENOUS
  Administered 2015-05-04: 120 ug via INTRAVENOUS
  Administered 2015-05-04: 200 ug via INTRAVENOUS

## 2015-05-04 MED ORDER — FENTANYL CITRATE (PF) 50 MCG/ML IJ SOLN (WRAP)
25.0000 ug | INTRAMUSCULAR | Status: DC | PRN
Start: 2015-05-04 — End: 2015-05-04
  Administered 2015-05-04 (×2): 25 ug via INTRAVENOUS
  Filled 2015-05-04: qty 2

## 2015-05-04 MED ORDER — FENTANYL CITRATE (PF) 50 MCG/ML IJ SOLN (WRAP)
INTRAMUSCULAR | Status: AC
Start: 2015-05-04 — End: ?
  Filled 2015-05-04: qty 2

## 2015-05-04 MED ORDER — SODIUM CHLORIDE 0.9 % IJ SOLN
10.0000 mL | INTRAMUSCULAR | Status: DC | PRN
Start: 2015-05-04 — End: 2015-05-04

## 2015-05-04 MED ORDER — HEPARIN SOD (PORK) LOCK FLUSH 10 UNIT/ML IV SOLN
3.0000 mL | Freq: Every day | INTRAVENOUS | Status: DC
Start: 2015-05-04 — End: 2015-05-04
  Administered 2015-05-04: 5 mL
  Filled 2015-05-04 (×2): qty 5

## 2015-05-04 MED ORDER — KETOROLAC TROMETHAMINE 30 MG/ML IJ SOLN
30.0000 mg | Freq: Four times a day (QID) | INTRAMUSCULAR | Status: DC
Start: 2015-05-04 — End: 2015-05-04

## 2015-05-04 MED ORDER — OXYCODONE HCL 5 MG PO TABS
5.0000 mg | ORAL_TABLET | Freq: Once | ORAL | Status: AC | PRN
Start: 2015-05-04 — End: 2015-05-04
  Administered 2015-05-04: 5 mg via ORAL
  Filled 2015-05-04: qty 1

## 2015-05-04 SURGICAL SUPPLY — 26 items
BANDAGE ACE UNSTERILE 3 (Dressings) ×2 IMPLANT
BANDAGE ESMARK 4IN  STERILE LF (Dressings) IMPLANT
BANDAGE KLING 2 STERILE (Dressings) ×2 IMPLANT
BLADE SAGITTAL 9.5 X 25.5 (Supply) ×2 IMPLANT
BURR CARBIDE MEDIUM OVAL 4X8 (Supply) ×2 IMPLANT
CAUTERY DISPOSABLE (Supply) ×2 IMPLANT
CUFF TOURNIQUET 18 (Supply) IMPLANT
GLOVE BIOGEL UT PI SURG SZ 7.5 (Supply) ×2 IMPLANT
LENGTHENER MINIRAIL STD ×2 IMPLANT
NDL HYPO 25GA X 1.5 (Supply) ×4 IMPLANT
PACK -SURGI-LOWER EXTREMITY-LF (Supply) ×2 IMPLANT
PAD GROUNDING REM  (ELECTRODE) (Supply) ×2 IMPLANT
PADDING CAST 3 X 4 YDS (Supply) ×4 IMPLANT
SCREW BONE SLFDRLL 60X20MM ×8 IMPLANT
SLEEVE LEG DISP. SINGLE-USE (Supply) IMPLANT
SOL SALINE IRRIG 500ML (Solutions) ×2 IMPLANT
STERISTRIP .25 (Supply) ×2 IMPLANT
SUT PROLENE 3-0 NDL PS-2 8687H (Supply) ×4 IMPLANT
SUT PROLENE 4-0 8682G (Supply) IMPLANT
SUT VICRYL 3-0 VIOLET J460H (Supply) IMPLANT
SUT VICRYL 4-0 J415H (Supply) IMPLANT
SUT VICRYL 5-0 J495H (Supply) IMPLANT
SYRINGE 10CC LL (Supply) ×4 IMPLANT
TIP INST SUCTION FRAZIER 12FR (Supply) ×2 IMPLANT
TUBING SUCTION 10FT STERILE (Supply) ×2 IMPLANT
WEDGE ILIAC CREST 1.5 ×2 IMPLANT

## 2015-05-04 NOTE — PACU (Signed)
Patient states, "Ready to get up to chair." Denies nausea, pain tolerable. VSS Patient dangled at bedside, tolerated well.  Assisted with dressing at bedside by Lenny Bouchillon. Tolerates activity well.

## 2015-05-04 NOTE — Discharge Instructions (Signed)
[facility]  DISCHARGE INSTRUCTIONS FOR FOOT  SURGERY - PODIATRY    POST ANESTHESIA:  . Although you will be awake and alert in the Recovery Room, small amounts of sedation will remain in your body for at least 24 hours, and you may feel tired and sleepy for the next 24 hours. You are advised to go directly home, take it easy and rest as much as possible.  . It is advisable to have someone with you at home for the remainder of the day.  . Children should not be left unattended for the next 24 hours.      FOR THE NEXT 24 HOURS:  . DO NOT operate a motor vehicle or any mechanical or electrical equipment.  . DO NOT make important decisions or sign legal documents.  . DO NOT consume alcohol, tranquilizers, sleeping medications, or any non-prescribed medication.    DIET:  . Begin with liquids and progress to soft food. Progress to your normal diet if not nauseated. Nausea and vomiting may occur in the first 24 hrs.    ACTIVITY:  . You are advised to go directly home from the hospital. Restrict your activities and rest for a day.  Resume activity as allowed by your surgeon.  Marland Kitchen Keep operative extremity elevated as much as possible to lessen swelling and discomfort that may place stress on your incision.  . Wear the surgical shoe(s) when walking if indicated.  . You may ambulate with/without crutches to maintain:  Weight Bearing: Non Weight Bearing to left foot in surgical shoe    MEDICATIONS:  . When taking pain medications, you may experience dizziness or drowsiness.  Do not drive when you are taking these medications.  Remember, pain medications may cause constipation.    . You had Oxycodone 5 mg in the Recovery Room at 5:00 PM.  You may begin your prescription pain medication after 9:00 PM and take as prescribe to you.     You had Tylenol while you were at the [facility]urg[facility]Center. Tylenol is in many different medications, including prescription pain medications, so please be careful not to exceed the  recommended amount.  Since you had Tylenol at 3:15 PM you should not take it again for four hours, which would be 7:15 PM.    You had a medication injected to prevent blood clots.  You may notice a bruise on your abdomen or upper arm.    DRESSINGS AND WOUND CARE:  . Do not remove the dressing/bandage unless instructed to do so by your physician.  Keep the operative area clean and dry.     . If you have a wire or a pin protruding from the skin, do not allow that area to get wet.  Keep it clean and dry. If the pin becomes loose, do not try to push the pin back in.  . If dressing "feels too tight" and swelling or discoloration of toes or foot occurs, contact your physician. Your toes should be warm, pink, moving, and with normal sensation. (Compare to opposite extremity).  Marland Kitchen Apply ice bag to operative area for 20 minutes every hour (while awake) for the next 24 to 48 hours.    IF YOU HAVE ANY OF THE FOLLOWING, PLEASE CONTACT YOUR PHYSICIAN:         1. Pain not controlled by the medication prescribed for you.         2. Unexplained fever. (temperature greater than 101 F)  3. Bleeding greater than spots from affected area.         4. Increased redness, warmth, hardness around the operative area.         5. Change in sensation, color or movement of affected extremity.         6. Other unexplained symptoms, problems or concerns.        7. If you experience redness or swelling at the IV insertion site that continues or worsens, please have it evaluated by your doctor.      Deep Vein Thrombosis (DVT) Precautions    The termvenous thromboembolism (VTE)is used to describe 2 conditions, deep vein thrombosis (DVT)andpulmonary embolism(PE).These conditions and their prevention and treatment are very closely related.  DVT occurs when a blood clot(thrombus)forms in a deep vein. This happens most often in the leg. It can also happen in the arms or other parts of the body. A part of the clot called anemboluscan break  off and travel to the lungs. This is called pulmonary embolism. PE is a medical emergency. It can cut off theflow of blood and cause death.  The symptoms of a blood clot areredness, swelling, and pain.These symptoms occur at the site of the DVT, such as in the leg.  Factors that increase risk of DVT includebeingoverweight, smoking,andusing male hormones (estrogen replacement therapy).Longperiodsof timewithout movement, such as when travelinglong distancesby car or plane, or when being confined to bed due to surgery, illness, or injury,also increases the risk of blood clots.  Home care  1. Follow your health care provider's instructions about activity and rest.  2. Support or compression stockings may have been prescribed. They help improve blood flow in the legs.Be sure to wear them as directed.  3. Whensitting or lying down, move your ankles, toes and knees often.This also improves blood flow in the legs.  4. You may have been prescribed an anticoagulants or "blood thinners." They may be given as pills (oral) or shots (injections).Make sure you follow all instructions on how to take them.  Follow-up care  Follow up with yourhealth care provider as advised.  When to seek medical advice  Call your health care provider if you have swelling, pain, or rednessin the leg, arm, or other area. These symptoms may mean a blood clot.    Call 911  Call 911or get emergency help ifyou have the following symptoms that may mean a blood clot in your lungs:   Trouble breathing   Chest pain   Coughing (may cough up blood)   Fast heartbeat   Sweating   Fainting   Heavy or uncontrolled bleeding    The [facility] staff who cared for you today would like to wish you a quick and comfortable recovery.  For any questions or concerns about the care you received, please contact us at [phone] Monday-Friday from 8:30-5:00pm.  Please direct any medical questions to your surgeon.   .    IF YOU NEED  IMMEDIATE ATTENTION AND YOUR SURGEON IS NOT AVAILABLE, COME TO [facility] EMERGENCY DEPARTMENT OR CALL [phone].       OBSTRUCTIVE SLEEP APNEA BREATHING RISK INFORMATION  Please read!    Dear Patient,      Thank you for choosing [facility] for your surgery.  We care about your safety during your surgery and when you return home. Today you were screened to help [place] decide whether you were at risk for problems that may arise with your breathing during surgery.  We  used a questionnaire to look for signs of problems you may have with breathing.  The questionnaire is only a screening tool.     Obstructive Sleep Apnea (OSA) is a common breathing disorder that may occur during sleep, during surgery, or after surgery.  Patients who have OSA can sometimes be at a higher risk of breathing complications during and after surgery because of the use of medications to relieve pain.  We use these medications to help manage your discomfort during and after surgery.  These medications may sometimes cause your breathing to slow down or cause the tissues in your throat to relax.  Because these medications can cause these things to happen, we will monitor your breathing during and after surgery while you are in the hospital.        You have a score on your screening questionnaire that indicated you may wish to have further testing in order to look for problems that may arise with your breathing at home after surgery or when you are asleep.  Please share this letter with your primary doctor and discuss whether you may need further testing, such as a sleep study.  A sleep study is the diagnostic test for OSA.  Becoming aware that you have OSA and working with your doctor to treat OSA can help keep you safe.    Sincerely,    Tempie Hoist. Gillian Scarce, [place] AFTER YOUR SURGERY TODAY:     Sleep with your head of bed elevated at  least 30 degrees, extra pillows may be used   Use your pain medications as sparingly as possible   Please use any breathing machine you may have at home while sleeping, day or night.   A care partner is suggested for the first 24 hours and while using pain medications regularly.   Call 911 or seek assistance for any respiratory distress  (i.e. fast or slow breathing that is different from your normal breathing, prolonged pauses in breathing, blue tinged lips or nailbeds, shortness of breath, or altered mental status such as confusion, sleepiness, or irritability).

## 2015-05-04 NOTE — PACU (Signed)
Patient states, "Ready to go home." Denies nausea, pain tolerable, VSS,  Tolerates PO fluids and activity.

## 2015-05-04 NOTE — Anesthesia Preprocedure Evaluation (Signed)
Anesthesia Evaluation    AIRWAY    Mallampati: II    Neck ROM: full  Mouth Opening:full   CARDIOVASCULAR    regular       DENTAL           PULMONARY    clear to auscultation     OTHER FINDINGS              PSS Anesthesia Comments: The chart and history were reviewed with the patient, including but not limited to:    Denied cardiac hx  +OSA but non-compliant with CPAP  +DM, BG 287 here today, usually 100-150's.  IV insulin pre-op with repeat    Risks and complications of the anesthetic including but not limited to neurological, cardiovascular, pulmonary complications, intraoperative awareness, dental trauma, allergic reaction, death discussed and fully informed consent obtained from patient and/or legal guardian.          Anesthesia Plan    ASA 3     MAC                     intravenous induction   Detailed anesthesia plan: MAC        Post op pain management: per surgeon    informed consent obtained    Plan discussed with CRNA.    ECG reviewed  pertinent labs reviewed

## 2015-05-04 NOTE — OR PreOp (Signed)
Dr Robina Ade notified of patients BS of 276 after 2 units iv of regular insulin.

## 2015-05-04 NOTE — H&P (Signed)
Update H&P. H & P reviewed.  Pt examined.    No changes.    Pt is now ready for reconstructive surgery on his left foot.    Janora Norlander, DPM

## 2015-05-04 NOTE — Brief Op Note (Signed)
BRIEF OP NOTE    Date Time: 4:51 PM, 05/04/2015      Patient Name:   [name]    Date of Operation:   05/04/2015    Providers Performing:   Dr. Elwanda Brooklyn DPM    Assistants:   Pilar Grammes DPM      Diagnosis:   Osteomyelitis, left foot    Procedure:   Procedure(s) (LRB):  ARTHRODESIS, TOE (Left)        Anesthesia:    [place], [place], DO   Anesthesiologist: Garret Reddish, MD; Neale Burly, MD; Alfonse Flavors, DO  CRNA: Cherrie Gauze, CRNA     Estimated Blood Loss:    23mL    Hemostasis: Manual compression    Implants:     Implants     Implant    Cement Bone Gentamicin Depuy - [id number] - Implanted     Inventory item: Cement Bone Gentamicin Depuy Model/Cat no.: [id number]    Serial no.:  Manufacturer: VHDEPUY    Lot no.: [id number] Size:     As of 03/12/2015     Status: Implanted                  Screw Bone Slfdrll 60x42mm - [id number] - Implanted     Inventory item: Screw Bone Slfdrll 60x64mm Model/Cat no.: [id number]    Serial no.:  Manufacturer: VHORTHOFIT    Lot no.:  Size:     As of 05/04/2015     Status: Implanted                  Mini Lengthener Standard - Implanted     Inventory item:  Model/Cat no.: [id number]    Serial no.:  Manufacturer: ORTHOFIX    Lot no.:  Size:     As of 05/04/2015     Status: Implanted                  Wedge Iliac Crest 1.5 - [id number] - Implanted     Inventory item: Wedge Iliac Crest 1.5 Model/Cat no.: [id number]    Serial no.: ID # [id number] Manufacturer: VHLIFENET    Lot no.:  Size:     As of 05/04/2015     Status: Implanted                         Materials: 3-0 prolene    Injectables: 10cc 0.5% marcaine plain, 2cc 0.25% marcaine with epinephrine    Pathology:   1. Tissue left foot for culture    Antibiotics:   Patient receiving Cefepime outpatient    Drains:   none  Complications:   None.    Findings: Soft bone appreciated to the metatarsal and proximal phalanx. Mini rail applied without incident.     Condition:   good    [facility] PGY-1

## 2015-05-04 NOTE — Transfer of Care (Signed)
Anesthesia Transfer of Care Note    Patient: [name]    Last vitals:   Filed Vitals:    05/04/15 1654   BP: 107/92   Pulse: 88   Temp: 36.3 C (97.3 F)   Resp: 16   SpO2: 98%       Oxygen: Nasal Cannula     Mental Status:awake and alert     Airway: Natural    Cardiovascular Status:  stable

## 2015-05-04 NOTE — Op Note (Signed)
SURGEON: Dr. Elwanda Brooklyn DPM    ASSISTANT: Pilar Grammes DPM    Date of Surgery: 05/04/15    PREOPERATIVE DIAGNOSIS:  Osteomyelitis, left foot    POSTOPERATIVE DIAGNOSIS:  Osteomyelitis, left foot    TITLE OF PROCEDURE:  Arthrodesis of 1st MPJ with allograft and mini rail application     ANESTHESIA:  MAC    ESTIMATED BLOOD LOSS:  10 mL    HEMOSTASIS:  Manual compression    IMPLANTS:  Mini-rail external fixator  60x64mm bone screws x4  Wedge iliac crest 1.5cm    MATERIALS:  3-0 prolene    INJECTABLES:  10cc 0.5% marcaine plain, 2cc 0.25% marcaine with epinephrine    PATHOLOGY:  1. Tissue left foot for cultures    ANTIBIOTICS:  Receiving IV Cefepime outpatient    COMPLICATIONS:  None.    INDICATIONS FOR PROCEDURE  Patient presents to [facility] with a chief complaint of non-healing ulceration to dorsal left foot status post previous 1st metatarsal head resection secondary to osteomyelitis and application of antibiotic spacer. The patient has failed conservative treatment including local wound care.  At this time the patient has elected to proceed with surgical correction arthrodesis of the 1st MPJ with bone graft and mini rail application. All alternatives, risks and complications of the procedures were thoroughly explained to the patient. Patient exhibits appropriate understanding of all discussion points and informed consent signed and obtained in chart with no guarantees to surgical outcome given or implied.    DESCRIPTION OF PROCEDURE  Patient was brought to the operating room and placed on the operative table in the supine position. Patient was secured to the table with safety belt, contralateral SCD placed and all bony prominences were padded. Following surgical time out all members of the operating room and surgical site were identified, and MAC anesthesia occurred. Local anesthesia was then administered about the operative field in a local infiltrative block fashion utilizing 10cc 0.5% marcaine plain, 2cc 0.25%  marcaine with epinephrine. The left foot was then prepped and draped in the usual sterile manner and the following procedure then began.    Attention was directed to the dorsomedial aspect of the first metatarsal-phalangel joint where a non-healing ulceration was present along the first MPJ. No significant erythema or edema was appreciated. A linear incision was made over the 1st MPJ to the level of the subcutaneous tissue with a 15 blade. Significant scar tissue was noted. Sharp and blunt dissection continued to the level of the capsule. A longitudinal incision was then made to the level of the bone through periosteum and capsule and reflected to adequately expose the base of the proximal phalanx and the previously resected 1st metatarsal. A rongeur was used to remove any fibrotic or osteophytic material within the dead space where the head of the metatarsal was previously resected. A small portion of the distal metatarsal shaft and the base of the proximal phalanx were resected with a sagittal saw. Once adequate exposure was achieved, a mini external-fixator was placed spanning the first metatarsophalangeal joint. Bicortical pins were then placed dorsally and parallel with the sagittal plane. Two pins were placed in the mid shaft of the first metatarsal and two were in the proximal phalanx. The mini-rail external fixation device was then applied and secured. An iliac crest allograft was then interpositioned between the metatarsal shaft and the proximal phalanx. The mini rail was then compressed, holding the graft in place. Any prominent bone edges were remodeled with a power burr. Fluroscopy confirmed  adequate placement.    The surgical site was then copiously flushed with sterile normal saline. The incision was closed with 3-0 prolene around the mini rail device. The previous wound was unable to be closed at this time. The surgical site was then dressed with bacitracin, saline moist 4x4s, dry 4x4s, kerlix and an  ACE. The patient tolerated both the procedure and anesthesia well with vital signs stable throughout. The patient was transferred from the OR to recovery under the discretion of anesthesia with vital signs stable and neurovascular status intact to the operative limb.    The patient will follow the protocol of rest, ice, and elevation and will be non-weight bearing to the operative limb in a surgical shoe. The patient will be discharged with all postoperative instructions and prescriptions when cleared by anesthesia and will follow up for the entire postoperative period in the office of Dr. Evette Doffing.    [place] PGY-1

## 2015-05-04 NOTE — H&P (Signed)
ADMISSION HISTORY AND PHYSICAL EXAM    Date Time: 05/04/2015 2:20 PM  Patient Name: [name]  Attending Physician: Janora Norlander, D*    Assessment:   Dysfunctional 1st MPJ left foot s/p resection of 1st metatarsal head due to acute osteomyelitis      Plan:   Admit for outpatient surgery today.  Plan for 1st MPJ fustion with interpositional bone graft and external fixation for reconstruction of his left foot to allow for optimal function.      History of Present Illness:   [name] is a 50 y.o. male who presents to the hospital for reconstructive surgery of the 1st MTPJ on the left.  He previously underwent excision of the 1st metatarsal head with implantation of antibiotic impregnated bone bloc for treatment of osteomyelitis.  He has completed 6 weeks of IV abx and local wound care.  He now presents for definative surgical intervention.  IV abx have been coordinated with ID.    Past Medical History:     Past Medical History   Diagnosis Date   . Seasonal allergic rhinitis    . Diabetes mellitus    . Disc    . Hypertension    . Diabetic ulcer of left foot 12/26/14   . Hyperlipidemia    . Type 2 diabetes mellitus, controlled    . Sleep apnea      not tested   . Gastroesophageal reflux disease    . Complication of anesthesia      last time he was kicking his legs had to wake him up       Past Surgical History:     Past Surgical History   Procedure Laterality Date   . Hand surgery     . Debridement & irrigation, lower extremity Left 03/12/2015     Procedure: [place], LOWER EXTREMITY;  Surgeon: Janora Norlander, DPM;  Location: [facility];  Service: Podiatry;  Laterality: Left;  I&D LEFT FOOT FOR BONE INFECTION       Family History:     Family History   Problem Relation Age of Onset   . Diabetes Mother    . Depression Sister    . COPD Sister    . Hypertension Brother        Social History:     Social History     Social History   . Marital Status: Single     Spouse Name:  N/A   . Number of Children: N/A   . Years of Education: N/A     Social History Main Topics   . Smoking status: Never Smoker    . Smokeless tobacco: Never Used   . Alcohol Use: No   . Drug Use: No   . Sexual Activity: Not on file     Other Topics Concern   . Not on file     Social History Narrative       Allergies:     Allergies   Allergen Reactions   . Flu Virus Vaccine Anaphylaxis       Medications:     Prescriptions prior to admission   Medication Sig   . aspirin (ASPIRIN CHILDRENS) 81 MG chewable tablet Chew 1 tablet (81 mg total) by mouth daily.   Marland Kitchen atorvastatin (LIPITOR) 10 MG tablet Take 10 mg by mouth nightly.   . famotidine (PEPCID) 20 MG tablet Take 1 tablet (20 mg total) by mouth daily.   Marland Kitchen gabapentin (NEURONTIN) 300 MG capsule  Take 1 capsule (300 mg total) by mouth 3 (three) times daily.   . insulin aspart (NOVOLOG FLEXPEN) 100 UNIT/ML injection pen Inject 7 Units into the skin 3 (three) times daily before meals.   . insulin glargine (LANTUS SOLOSTAR) 100 UNIT/ML injection pen Inject 28 Units into the skin every morning.   Marland Kitchen lisinopril (PRINIVIL,ZESTRIL) 20 MG tablet Take 20 mg by mouth nightly.   . metFORMIN (GLUCOPHAGE) 1000 MG tablet Take 1 tablet (1,000 mg total) by mouth 2 (two) times daily with meals.   Marland Kitchen oxymetazoline (AFRIN) 0.05 % nasal spray 2 sprays by Nasal route 2 (two) times daily as needed for Congestion.   . collagenase (SANTYL) ointment Apply topically daily.   . cyclobenzaprine (FLEXERIL) 10 MG tablet Take 1 tablet (10 mg total) by mouth every 8 (eight) hours as needed for Muscle spasms (pain or muscle spasm).   . insulin aspart (NOVOLOG) 100 UNIT/ML injection pen Inject 2-12 Units into the skin every 6 (six) hours as needed for High Blood Sugar.   . insulin aspart (NOVOLOG) 100 UNIT/ML injection pen Inject 1-4 Units into the skin nightly as needed for High Blood Sugar.   . oxyCODONE-acetaminophen (PERCOCET) 5-325 MG per tablet Take 1-2 tablets by mouth every 4 (four) hours as needed.        Review of Systems:   A comprehensive review of systems was: History obtained from the patient  Endocrine ROS: negative  Respiratory ROS: negative  Cardiovascular ROS: negative  Gastrointestinal ROS: negative  Musculoskeletal ROS: negative  Neurological ROS: negative  Dermatological ROS: negative    Physical Exam:     Filed Vitals:    05/04/15 1216   BP: 143/95   Pulse: 108   Temp: 98.8 F (37.1 C)   Resp: 18   SpO2: 98%       Intake and Output Summary (Last 24 hours) at Date Time  No intake or output data in the 24 hours ending 05/04/15 1420    Chest - clear to auscultation, no wheezes, rales or rhonchi, symmetric air entry  Heart - normal rate, regular rhythm, normal S1, S2, no murmurs, rubs, clicks or gallops  Abdomen - soft, nontender, nondistended, no masses or organomegaly  Neurological - alert, oriented, normal speech, no focal findings or movement disorder noted  Musculoskeletal - no joint tenderness, deformity or swelling  Extremities - peripheral pulses normal, no pedal edema, no clubbing or cyanosis    Left foot with mild edema.  No gross infection.  Small open area at the dorsal aspect of the 1st MPJ.      Labs:     Results     Procedure Component Value Units Date/Time    Dextrose Stick Glucose [id number]  (Abnormal) Collected:  05/04/15 1320    Specimen Information:  Blood Updated:  05/04/15 1337     Glucose, POCT 276 (H) mg/dL     Dextrose Stick Glucose [id number]  (Abnormal) Collected:  05/04/15 1227    Specimen Information:  Blood Updated:  05/04/15 1255     Glucose, POCT 287 (H) mg/dL           Recent BMP No results for input(s): GLU, BUN, CREAT, CA, NA, K, CL, CO2 in the last 24 hours.    Invalid input(s): AGAP  Recent CBC WITH DIFF No results for input(s): RBC, HGB, HCT, MCV, MCHC, RDW, MPV, LABPLAT in the last 24 hours.    Invalid input(s): WHITEBLOODCE, ADIFF, REFLX, CANCL, BAND, ABAND    Rads:  Radiological Procedure reviewed.    Signed by: Janora Norlander, DPM          I hereby  certify this patient for hospitalization based upon medical necessity as noted above.

## 2015-05-04 NOTE — Anesthesia Postprocedure Evaluation (Signed)
Anesthesia Post Evaluation    Patient: [name]    Procedures performed: Procedure(s) with comments:  ARTHRODESIS, TOE - 1st MPJ FUSION    Anesthesia type: MAC    Patient location:Phase II PACU    Last vitals:   Filed Vitals:    05/04/15 1726   BP: 142/86   Pulse: 94   Temp:    Resp: 16   SpO2: 94%       Post pain: Patient not complaining of pain, continue current therapy      Mental Status:awake and alert     Respiratory Function: tolerating room air    Cardiovascular: stable    Nausea/Vomiting: patient not complaining of nausea or vomiting    Hydration Status: adequate    Post assessment: no apparent anesthetic complications and no reportable events

## 2015-05-05 ENCOUNTER — Other Ambulatory Visit
Admission: RE | Admit: 2015-05-05 | Discharge: 2015-05-05 | Disposition: A | Discharge status: Home / Self Care | Payer: BC Managed Care – PPO | Source: Ambulatory Visit | Attending: Internal Medicine | Admitting: Internal Medicine

## 2015-05-05 DIAGNOSIS — E1140 Type 2 diabetes mellitus with diabetic neuropathy, unspecified: Secondary | ICD-10-CM | POA: Insufficient documentation

## 2015-05-05 DIAGNOSIS — L97529 Non-pressure chronic ulcer of other part of left foot with unspecified severity: Secondary | ICD-10-CM | POA: Insufficient documentation

## 2015-05-05 DIAGNOSIS — M869 Osteomyelitis, unspecified: Secondary | ICD-10-CM | POA: Insufficient documentation

## 2015-05-05 LAB — COMPREHENSIVE METABOLIC PANEL
ALT: 10 U/L (ref 0–55)
AST (SGOT): 11 U/L (ref 10–42)
Albumin/Globulin Ratio: 0.85 Ratio (ref 0.70–1.50)
Albumin: 2.9 gm/dL — ABNORMAL LOW (ref 3.5–5.0)
Alkaline Phosphatase: 85 U/L (ref 40–145)
Anion Gap: 14.5 mMol/L (ref 7.0–18.0)
BUN / Creatinine Ratio: 19.5 Ratio (ref 10.0–30.0)
BUN: 23 mg/dL — ABNORMAL HIGH (ref 7–22)
Bilirubin, Total: 0.2 mg/dL (ref 0.1–1.2)
CO2: 22.7 mMol/L (ref 20.0–30.0)
Calcium: 8.5 mg/dL (ref 8.5–10.5)
Chloride: 102 mMol/L (ref 98–110)
Creatinine: 1.18 mg/dL (ref 0.80–1.30)
EGFR: >60 mL/min/{1.73_m2}
Globulin: 3.4 gm/dL (ref 2.0–4.0)
Glucose: 420 mg/dL — ABNORMAL HIGH (ref 70–99)
Osmolality Calc: 291 mOsm/kg (ref 275–300)
Potassium: 4.5 mMol/L (ref 3.5–5.3)
Protein, Total: 6.3 gm/dL (ref 6.0–8.3)
Sodium: 135 mMol/L — ABNORMAL LOW (ref 136–147)

## 2015-05-05 LAB — CBC AND DIFFERENTIAL
Basophils %: 0.3 % (ref 0.0–3.0)
Basophils Absolute: 0 10*3/uL (ref 0.0–0.3)
Eosinophils %: 2.9 % (ref 0.0–7.0)
Eosinophils Absolute: 0.3 10*3/uL (ref 0.0–0.8)
Hematocrit: 37.8 % — ABNORMAL LOW (ref 39.0–52.5)
Hemoglobin: 13.3 gm/dL (ref 13.0–17.5)
Lymphocytes Absolute: 1.1 10*3/uL (ref 0.6–5.1)
Lymphocytes: 10.4 % — ABNORMAL LOW (ref 15.0–46.0)
MCH: 31 pg (ref 28–35)
MCHC: 35 gm/dL (ref 31–36)
MCV: 87 fL (ref 80–100)
MPV: 9.8 fL (ref 6.0–10.0)
Monocytes Absolute: 0.5 10*3/uL (ref 0.1–1.7)
Monocytes: 5 % (ref 3.0–15.0)
Neutrophils %: 81.4 % — ABNORMAL HIGH (ref 42.0–78.0)
Neutrophils Absolute: 8.3 10*3/uL (ref 1.7–8.6)
PLT CT: 145 10*3/uL (ref 130–440)
RBC: 4.36 10*6/uL (ref 4.00–5.70)
RDW: 13.1 % (ref 10.5–14.5)
WBC: 10.3 10*3/uL (ref 4.00–11.00)

## 2015-05-05 LAB — SEDIMENTATION RATE: Sed Rate: 40 mm/hr — ABNORMAL HIGH (ref 0–20)

## 2015-05-05 LAB — C-REACTIVE PROTEIN: C-Reactive Protein: 2.1 mg/dL — ABNORMAL HIGH (ref 0.02–0.80)

## 2015-05-05 MED FILL — Bacitracin Oint 500 Unit/GM: CUTANEOUS | Qty: 0.9 | Status: AC

## 2015-05-06 ENCOUNTER — Encounter (AMBULATORY_SURGERY_CENTER): Payer: Self-pay | Admitting: Foot & Ankle Surgery

## 2015-05-12 ENCOUNTER — Other Ambulatory Visit
Admission: RE | Admit: 2015-05-12 | Discharge: 2015-05-12 | Disposition: A | Discharge status: Home / Self Care | Payer: BC Managed Care – PPO | Source: Skilled Nursing Facility | Attending: Internal Medicine | Admitting: Internal Medicine

## 2015-05-12 DIAGNOSIS — M869 Osteomyelitis, unspecified: Secondary | ICD-10-CM | POA: Insufficient documentation

## 2015-05-12 DIAGNOSIS — L97529 Non-pressure chronic ulcer of other part of left foot with unspecified severity: Secondary | ICD-10-CM | POA: Insufficient documentation

## 2015-05-12 LAB — COMPREHENSIVE METABOLIC PANEL
ALT: 9 U/L (ref 0–55)
AST (SGOT): 17 U/L (ref 10–42)
Albumin/Globulin Ratio: 0.76 Ratio (ref 0.70–1.50)
Albumin: 2.8 gm/dL — ABNORMAL LOW (ref 3.5–5.0)
Alkaline Phosphatase: 83 U/L (ref 40–145)
Anion Gap: 14 mMol/L (ref 7.0–18.0)
BUN / Creatinine Ratio: 20.6 Ratio (ref 10.0–30.0)
BUN: 22 mg/dL (ref 7–22)
Bilirubin, Total: 0.2 mg/dL (ref 0.1–1.2)
CO2: 24.4 mMol/L (ref 20.0–30.0)
Calcium: 8.9 mg/dL (ref 8.5–10.5)
Chloride: 103 mMol/L (ref 98–110)
Creatinine: 1.07 mg/dL (ref 0.80–1.30)
EGFR: >60 mL/min/{1.73_m2}
Globulin: 3.7 gm/dL (ref 2.0–4.0)
Glucose: 310 mg/dL — ABNORMAL HIGH (ref 70–99)
Osmolality Calc: 287 mOsm/kg (ref 275–300)
Potassium: 4.8 mMol/L (ref 3.5–5.3)
Protein, Total: 6.5 gm/dL (ref 6.0–8.3)
Sodium: 136 mMol/L (ref 136–147)

## 2015-05-12 LAB — CBC AND DIFFERENTIAL
Basophils %: 0.7 % (ref 0.0–3.0)
Basophils Absolute: 0.1 10*3/uL (ref 0.0–0.3)
Eosinophils %: 2.7 % (ref 0.0–7.0)
Eosinophils Absolute: 0.2 10*3/uL (ref 0.0–0.8)
Hematocrit: 38 % — ABNORMAL LOW (ref 39.0–52.5)
Hemoglobin: 12.9 gm/dL — ABNORMAL LOW (ref 13.0–17.5)
Lymphocytes Absolute: 2 10*3/uL (ref 0.6–5.1)
Lymphocytes: 23.5 % (ref 15.0–46.0)
MCH: 30 pg (ref 28–35)
MCHC: 34 gm/dL (ref 31–36)
MCV: 87 fL (ref 80–100)
MPV: 9.2 fL (ref 6.0–10.0)
Monocytes Absolute: 0.6 10*3/uL (ref 0.1–1.7)
Monocytes: 7 % (ref 3.0–15.0)
Neutrophils %: 66 % (ref 42.0–78.0)
Neutrophils Absolute: 5.7 10*3/uL (ref 1.7–8.6)
PLT CT: 206 10*3/uL (ref 130–440)
RBC: 4.37 10*6/uL (ref 4.00–5.70)
RDW: 12.7 % (ref 10.5–14.5)
WBC: 8.6 10*3/uL (ref 4.00–11.00)

## 2015-05-12 LAB — C-REACTIVE PROTEIN: C-Reactive Protein: 0.71 mg/dL (ref 0.02–0.80)

## 2015-05-12 LAB — SEDIMENTATION RATE: Sed Rate: 58 mm/hr — ABNORMAL HIGH (ref 0–20)

## 2015-05-13 NOTE — Telephone Encounter (Signed)
Have tried to contact the patient several times with no answer and no answering machine. He has the smallest needles he can have for the insulin.

## 2015-05-14 ENCOUNTER — Ambulatory Visit (INDEPENDENT_AMBULATORY_CARE_PROVIDER_SITE_OTHER): Payer: BC Managed Care – PPO | Admitting: Family Medicine

## 2015-05-19 ENCOUNTER — Other Ambulatory Visit: Admit: 2015-05-19 | Payer: Self-pay | Source: Ambulatory Visit

## 2015-05-19 ENCOUNTER — Other Ambulatory Visit
Admission: RE | Admit: 2015-05-19 | Discharge: 2015-05-19 | Disposition: A | Discharge status: Home / Self Care | Payer: BC Managed Care – PPO | Source: Ambulatory Visit | Attending: Internal Medicine | Admitting: Internal Medicine

## 2015-05-19 DIAGNOSIS — E11621 Type 2 diabetes mellitus with foot ulcer: Secondary | ICD-10-CM | POA: Insufficient documentation

## 2015-05-19 DIAGNOSIS — L97529 Non-pressure chronic ulcer of other part of left foot with unspecified severity: Secondary | ICD-10-CM | POA: Insufficient documentation

## 2015-05-19 DIAGNOSIS — E1140 Type 2 diabetes mellitus with diabetic neuropathy, unspecified: Secondary | ICD-10-CM | POA: Insufficient documentation

## 2015-05-19 DIAGNOSIS — M869 Osteomyelitis, unspecified: Secondary | ICD-10-CM | POA: Insufficient documentation

## 2015-05-19 LAB — COMPREHENSIVE METABOLIC PANEL
ALT: 9 U/L (ref 0–55)
AST (SGOT): 15 U/L (ref 10–42)
Albumin/Globulin Ratio: 0.88 Ratio (ref 0.70–1.50)
Albumin: 2.8 gm/dL — ABNORMAL LOW (ref 3.5–5.0)
Alkaline Phosphatase: 69 U/L (ref 40–145)
Anion Gap: 12.6 mMol/L (ref 7.0–18.0)
BUN / Creatinine Ratio: 18.6 Ratio (ref 10.0–30.0)
BUN: 18 mg/dL (ref 7–22)
Bilirubin, Total: 0.2 mg/dL (ref 0.1–1.2)
CO2: 27.1 mMol/L (ref 20.0–30.0)
Calcium: 8.5 mg/dL (ref 8.5–10.5)
Chloride: 105 mMol/L (ref 98–110)
Creatinine: 0.97 mg/dL (ref 0.80–1.30)
EGFR: 89 mL/min/{1.73_m2} (ref 60–150)
Globulin: 3.2 gm/dL (ref 2.0–4.0)
Glucose: 170 mg/dL — ABNORMAL HIGH (ref 70–99)
Osmolality Calc: 286 mOsm/kg (ref 275–300)
Potassium: 3.8 mMol/L (ref 3.5–5.3)
Protein, Total: 6 gm/dL (ref 6.0–8.3)
Sodium: 141 mMol/L (ref 136–147)

## 2015-05-19 LAB — CBC AND DIFFERENTIAL
Basophils %: 0.8 % (ref 0.0–3.0)
Basophils Absolute: 0.1 10*3/uL (ref 0.0–0.3)
Eosinophils %: 3.4 % (ref 0.0–7.0)
Eosinophils Absolute: 0.3 10*3/uL (ref 0.0–0.8)
Hematocrit: 36.8 % — ABNORMAL LOW (ref 39.0–52.5)
Hemoglobin: 12.8 gm/dL — ABNORMAL LOW (ref 13.0–17.5)
Lymphocytes Absolute: 2.2 10*3/uL (ref 0.6–5.1)
Lymphocytes: 28.5 % (ref 15.0–46.0)
MCH: 30 pg (ref 28–35)
MCHC: 35 gm/dL (ref 31–36)
MCV: 86 fL (ref 80–100)
MPV: 9.5 fL (ref 6.0–10.0)
Monocytes Absolute: 0.4 10*3/uL (ref 0.1–1.7)
Monocytes: 5 % (ref 3.0–15.0)
Neutrophils %: 62.4 % (ref 42.0–78.0)
Neutrophils Absolute: 4.7 10*3/uL (ref 1.7–8.6)
PLT CT: 156 10*3/uL (ref 130–440)
RBC: 4.29 10*6/uL (ref 4.00–5.70)
RDW: 12.5 % (ref 10.5–14.5)
WBC: 7.6 10*3/uL (ref 4.00–11.00)

## 2015-05-19 LAB — SEDIMENTATION RATE: Sed Rate: 35 mm/hr — ABNORMAL HIGH (ref 0–20)

## 2015-05-19 LAB — C-REACTIVE PROTEIN: C-Reactive Protein: 0.11 mg/dL (ref 0.02–0.80)

## 2015-05-21 ENCOUNTER — Other Ambulatory Visit (INDEPENDENT_AMBULATORY_CARE_PROVIDER_SITE_OTHER): Payer: Self-pay

## 2015-05-21 DIAGNOSIS — L97529 Non-pressure chronic ulcer of other part of left foot with unspecified severity: Secondary | ICD-10-CM

## 2015-05-21 DIAGNOSIS — E1140 Type 2 diabetes mellitus with diabetic neuropathy, unspecified: Secondary | ICD-10-CM

## 2015-06-22 ENCOUNTER — Ambulatory Visit (INDEPENDENT_AMBULATORY_CARE_PROVIDER_SITE_OTHER): Payer: BC Managed Care – PPO | Admitting: Family Medicine

## 2015-07-03 ENCOUNTER — Telehealth (INDEPENDENT_AMBULATORY_CARE_PROVIDER_SITE_OTHER): Payer: Self-pay | Admitting: Family Medicine

## 2015-07-03 ENCOUNTER — Ambulatory Visit
Admission: RE | Admit: 2015-07-03 | Discharge: 2015-07-03 | Disposition: A | Discharge status: Home / Self Care | Payer: BC Managed Care – PPO | Source: Ambulatory Visit | Attending: Family Medicine | Admitting: Family Medicine

## 2015-07-03 DIAGNOSIS — E1140 Type 2 diabetes mellitus with diabetic neuropathy, unspecified: Secondary | ICD-10-CM | POA: Insufficient documentation

## 2015-07-03 LAB — COMPREHENSIVE METABOLIC PANEL
ALT: 21 U/L (ref 0–55)
AST (SGOT): 14 U/L (ref 10–42)
Albumin/Globulin Ratio: 0.75 Ratio (ref 0.70–1.50)
Albumin: 2.9 gm/dL — ABNORMAL LOW (ref 3.5–5.0)
Alkaline Phosphatase: 79 U/L (ref 40–145)
Anion Gap: 14.8 mMol/L (ref 7.0–18.0)
BUN / Creatinine Ratio: 23.3 Ratio (ref 10.0–30.0)
BUN: 28 mg/dL — ABNORMAL HIGH (ref 7–22)
Bilirubin, Total: 0.3 mg/dL (ref 0.1–1.2)
CO2: 22.8 mMol/L (ref 20.0–30.0)
Calcium: 8.8 mg/dL (ref 8.5–10.5)
Chloride: 104 mMol/L (ref 98–110)
Creatinine: 1.2 mg/dL (ref 0.80–1.30)
EGFR: 69 mL/min/{1.73_m2} (ref 60–150)
Globulin: 3.8 gm/dL (ref 2.0–4.0)
Glucose: 246 mg/dL — ABNORMAL HIGH (ref 70–99)
Osmolality Calc: 288 mOsm/kg (ref 275–300)
Potassium: 4.7 mMol/L (ref 3.5–5.3)
Protein, Total: 6.7 gm/dL (ref 6.0–8.3)
Sodium: 137 mMol/L (ref 136–147)

## 2015-07-03 LAB — HEMOGLOBIN A1C: Hgb A1C, %: 9.6 %

## 2015-07-09 ENCOUNTER — Ambulatory Visit (INDEPENDENT_AMBULATORY_CARE_PROVIDER_SITE_OTHER): Payer: BC Managed Care – PPO | Admitting: Family Medicine

## 2015-07-09 ENCOUNTER — Encounter (INDEPENDENT_AMBULATORY_CARE_PROVIDER_SITE_OTHER): Payer: Self-pay | Admitting: Family Medicine

## 2015-07-09 VITALS — BP 140/78 | HR 117 | Temp 97.8°F | Resp 16 | Ht 66.0 in | Wt 247.0 lb

## 2015-07-09 DIAGNOSIS — E118 Type 2 diabetes mellitus with unspecified complications: Secondary | ICD-10-CM

## 2015-07-09 DIAGNOSIS — Z794 Long term (current) use of insulin: Secondary | ICD-10-CM

## 2015-07-09 DIAGNOSIS — I10 Essential (primary) hypertension: Secondary | ICD-10-CM

## 2015-07-09 MED ORDER — LISINOPRIL 20 MG PO TABS
30.0000 mg | ORAL_TABLET | Freq: Every evening | ORAL | Status: DC
Start: ? — End: 2015-07-09

## 2015-07-09 MED ORDER — EMPAGLIFLOZIN-LINAGLIPTIN 10-5 MG PO TABS
1.0000 | ORAL_TABLET | Freq: Every day | ORAL | Status: DC
Start: ? — End: 2015-07-09

## 2015-07-09 NOTE — Patient Instructions (Signed)
1. Increase your lantus to 32 units.  Novolog sliding scale continue present dose  2. We will start glyxambi which will most likely need to get a prior authorization.  You need to take this with a lot of water  3. Increase you lisinopril to 1 1/2 tablet daily 1 week after you start the glyxambi

## 2015-07-28 NOTE — Progress Notes (Signed)
PROGRESS NOTE    Date Time: 07/28/2015 8:50 PM  Patient Name: [name], [name]  Primary Care Physician: Rhunette Croft, MD      History of Presenting Illness:   [name] is a 50 y.o. male who presents to the office with   Chief Complaint   Patient presents with   . Diabetes     3 mo f/u   . Hypertension     3 mo f/u   1. Diabetes mellitus - Patient reports compliance with diet and medication.  Present medications include lantus 28 units plus novolog sliding scale, invokanal, and metformin.  The last eye exam was 2 weeks prior.  The patient is taking a daily aspirin, and ACEI or ARB, and a statin.  Denies polyuria, polydipsia, polyphagia, weight change, blurred vision.  Patient is monitoring fingerstick glucose and has readings of  110-160.  He has not had any symptomatic lows.  No history of diabetic retinopathy, diabetic nephropathy, diabetic neuropathy, diabetic gastroparesis.  The patient is concerned about his lack of activity and his weight gain that has been associated  2. Diabetic foot ulcer - he is presently on antibiotics and is receiving wound care to the L foot ulcer.  There has been L foot drainage.    3. Hypertension - he is compliant with medication, presently on lisinopril 20mg .  He does not routinely check his blood pressure but when it has been checked he is not at goal.  Has no medication side effects.  Denies chest pain, palpitations, cough, sob or edema.  No neurological deficits    Past Medical History:     Past Medical History   Diagnosis Date   . Seasonal allergic rhinitis    . Diabetes mellitus    . Disc    . Hypertension    . Diabetic ulcer of left foot 12/26/14   . Hyperlipidemia    . Type 2 diabetes mellitus, controlled    . Sleep apnea      not tested   . Gastroesophageal reflux disease    . Complication of anesthesia      last time he was kicking his legs had to wake him up       Past Surgical History:     Past Surgical History   Procedure Laterality Date   . Hand surgery      . Debridement & irrigation, lower extremity Left 03/12/2015     Procedure: [place], LOWER EXTREMITY;  Surgeon: Janora Norlander, DPM;  Location: [facility];  Service: Podiatry;  Laterality: Left;  I&D LEFT FOOT FOR BONE INFECTION   . Arthrodesis, toe Left 05/04/2015     Procedure: ARTHRODESIS, TOE;  Surgeon: Janora Norlander, DPM;  Location: [facility];  Service: Podiatry;  Laterality: Left;  1st MPJ FUSION       Family History:     Family History   Problem Relation Age of Onset   . Diabetes Mother    . Depression Sister    . COPD Sister    . Hypertension Brother        Social History:     History   Smoking status   . Never Smoker    Smokeless tobacco   . Never Used     History   Alcohol Use No     History   Drug Use No       Allergies:     Allergies   Allergen Reactions   .  Flu Virus Vaccine Anaphylaxis       Medications:     Prior to Admission medications    Medication Sig Start Date End Date Taking? Authorizing Provider   aspirin (ASPIRIN CHILDRENS) 81 MG chewable tablet Chew 1 tablet (81 mg total) by mouth daily. 07/24/14  Yes Rhunette Croft, MD   atorvastatin (LIPITOR) 10 MG tablet Take 10 mg by mouth nightly.   Yes [provider]   cephalexin (KEFLEX) 500 MG capsule Take 500 mg by mouth 4 (four) times daily.   Yes [provider]   famotidine (PEPCID) 20 MG tablet Take 1 tablet (20 mg total) by mouth daily. 03/17/15  Yes Goyal, Jani Gravel, MD   gabapentin (NEURONTIN) 300 MG capsule Take 1 capsule (300 mg total) by mouth 3 (three) times daily. 02/12/15  Yes Rhunette Croft, MD   insulin aspart (NOVOLOG) 100 UNIT/ML injection pen Inject 2-12 Units into the skin every 6 (six) hours as needed for High Blood Sugar. 03/17/15  Yes Goyal, Jani Gravel, MD   insulin aspart (NOVOLOG) 100 UNIT/ML injection pen Inject 1-4 Units into the skin nightly as needed for High Blood Sugar. 03/17/15  Yes Goyal, Jani Gravel, MD   insulin glargine  (LANTUS SOLOSTAR) 100 UNIT/ML injection pen Inject 28 Units into the skin every morning. 03/17/15  Yes Goyal, Jani Gravel, MD   lisinopril (PRINIVIL,ZESTRIL) 20 MG tablet Take 1.5 tablets (30 mg total) by mouth nightly. 07/09/15  Yes Rhunette Croft, MD   metFORMIN (GLUCOPHAGE) 1000 MG tablet Take 1 tablet (1,000 mg total) by mouth 2 (two) times daily with meals. 10/10/14  Yes Rhunette Croft, MD   oxymetazoline (AFRIN) 0.05 % nasal spray 2 sprays by Nasal route 2 (two) times daily as needed for Congestion.   Yes [provider]   collagenase (SANTYL) ointment Apply topically daily. 12/25/14   Rhunette Croft, MD   cyclobenzaprine (FLEXERIL) 10 MG tablet Take 1 tablet (10 mg total) by mouth every 8 (eight) hours as needed for Muscle spasms (pain or muscle spasm). 05/27/14   Melynda Keller, PA   Empagliflozin-Linagliptin (GLYXAMBI) 10-5 MG Tab Take 1 tablet by mouth daily. 07/09/15   Rhunette Croft, MD   insulin aspart (NOVOLOG FLEXPEN) 100 UNIT/ML injection pen Inject 7 Units into the skin 3 (three) times daily before meals. 02/20/15   Rhunette Croft, MD       Review of Systems:      14 System Review negative except as noted in the history of present illness    Physical Exam:     Filed Vitals:    07/09/15 0841   BP: 140/78   Pulse: 117   Temp: 97.8 F (36.6 C)   Resp: 16   SpO2: 97%     Body mass index is 39.89 kg/(m^2).    General: awake, alert, oriented x 3; no acute distress.  HEENT: eomi, sclera anicteric, oropharynx clear without lesions, mucous membranes moist, normocephalic, atraumatic, Fundiscopic exam with sharp disc margins, no exudate or lesions, no av crossing defects and no copper wiring.  Neck: supple, no lymphadenopathy, no thyromegaly, no JVD, no carotid bruits, trachea midline  Cardiovascular: regular rate and rhythm, no murmurs, rubs or gallops  Lungs: clear to auscultation bilaterally, without wheezing, rhonchi, or rales, resp even and unlabored with normal  effort  Abdomen: soft, non-tender, non-distended; no palpable masses, no hepatosplenomegaly, normoactive bowel sounds, no rebound or guarding  Extremities: no clubbing, cyanosis, or edema  Musculoskeletal: No  effusion or deformity  Neuro: cranial nerves grossly intactintact  Skin: no rashes or lesions noted  Psych: Affect and mood are congruent, well groomed, good eye contact  Other:   Recent Results (from the past 2016 hour(s))   Comprehensive metabolic panel    Collection Time: 05/12/15 10:00 AM   Result Value Ref Range    Sodium 136 136 - 147 mMol/L    Potassium 4.8 3.5 - 5.3 mMol/L    Chloride 103 98 - 110 mMol/L    CO2 24.4 20.0 - 30.0 mMol/L    Calcium 8.9 8.5 - 10.5 mg/dL    Glucose 310 (H) 70 - 99 mg/dL    Creatinine 1.07 0.80 - 1.30 mg/dL    BUN 22 7 - 22 mg/dL    Protein, Total 6.5 6.0 - 8.3 gm/dL    Albumin 2.8 (L) 3.5 - 5.0 gm/dL    Alkaline Phosphatase 83 40 - 145 U/L    ALT 9 0 - 55 U/L    AST (SGOT) 17 10 - 42 U/L    Bilirubin, Total 0.2 0.1 - 1.2 mg/dL    Albumin/Globulin Ratio 0.76 0.70 - 1.50 Ratio    Anion Gap 14.0 7.0 - 18.0 mMol/L    BUN/Creatinine Ratio 20.6 10.0 - 30.0 Ratio    EGFR >60 mL/min/1.40m2    Osmolality Calc 287 275 - 300 mOsm/kg    Globulin 3.7 2.0 - 4.0 gm/dL   CBC and differential    Collection Time: 05/12/15 10:00 AM   Result Value Ref Range    WBC 8.6 4.00 - 11.00 K/cmm    RBC 4.37 4.00 - 5.70 M/cmm    Hemoglobin 12.9 (L) 13.0 - 17.5 gm/dL    Hematocrit 38.0 (L) 39.0 - 52.5 %    MCV 87 80 - 100 fL    MCH 30 28 - 35 pg    MCHC 34 31 - 36 gm/dL    RDW 12.7 10.5 - 14.5 %    PLT CT 206 130 - 440 K/cmm    MPV 9.2 6.0 - 10.0 fL    NEUTROPHIL % 66.0 42.0 - 78.0 %    Lymphocytes 23.5 15.0 - 46.0 %    Monocytes 7.0 3.0 - 15.0 %    Eosinophils % 2.7 0.0 - 7.0 %    Basophils % 0.7 0.0 - 3.0 %    Neutrophils Absolute 5.7 1.7 - 8.6 K/cmm    Lymphocytes Absolute 2.0 0.6 - 5.1 K/cmm    Monocytes Absolute 0.6 0.1 - 1.7 K/cmm    Eosinophils Absolute 0.2 0.0 - 0.8 K/cmm    BASO Absolute 0.1 0.0  - 0.3 K/cmm   Sedimentation rate (ESR)    Collection Time: 05/12/15 10:00 AM   Result Value Ref Range    Sed Rate 58 (H) 0 - 20 mm/hr   C Reactive Protein    Collection Time: 05/12/15 10:00 AM   Result Value Ref Range    C-Reactive Protein 0.71 0.02 - 0.80 mg/dL   Comprehensive metabolic panel    Collection Time: 05/19/15  9:25 AM   Result Value Ref Range    Sodium 141 136 - 147 mMol/L    Potassium 3.8 3.5 - 5.3 mMol/L    Chloride 105 98 - 110 mMol/L    CO2 27.1 20.0 - 30.0 mMol/L    Calcium 8.5 8.5 - 10.5 mg/dL    Glucose 170 (H) 70 - 99 mg/dL    Creatinine 0.97 0.80 - 1.30 mg/dL  BUN 18 7 - 22 mg/dL    Protein, Total 6.0 6.0 - 8.3 gm/dL    Albumin 2.8 (L) 3.5 - 5.0 gm/dL    Alkaline Phosphatase 69 40 - 145 U/L    ALT 9 0 - 55 U/L    AST (SGOT) 15 10 - 42 U/L    Bilirubin, Total 0.2 0.1 - 1.2 mg/dL    Albumin/Globulin Ratio 0.88 0.70 - 1.50 Ratio    Anion Gap 12.6 7.0 - 18.0 mMol/L    BUN/Creatinine Ratio 18.6 10.0 - 30.0 Ratio    EGFR 89 60 - 150 mL/min/1.25m2    Osmolality Calc 286 275 - 300 mOsm/kg    Globulin 3.2 2.0 - 4.0 gm/dL   CBC and differential    Collection Time: 05/19/15  9:25 AM   Result Value Ref Range    WBC 7.6 4.00 - 11.00 K/cmm    RBC 4.29 4.00 - 5.70 M/cmm    Hemoglobin 12.8 (L) 13.0 - 17.5 gm/dL    Hematocrit 36.8 (L) 39.0 - 52.5 %    MCV 86 80 - 100 fL    MCH 30 28 - 35 pg    MCHC 35 31 - 36 gm/dL    RDW 12.5 10.5 - 14.5 %    PLT CT 156 130 - 440 K/cmm    MPV 9.5 6.0 - 10.0 fL    NEUTROPHIL % 62.4 42.0 - 78.0 %    Lymphocytes 28.5 15.0 - 46.0 %    Monocytes 5.0 3.0 - 15.0 %    Eosinophils % 3.4 0.0 - 7.0 %    Basophils % 0.8 0.0 - 3.0 %    Neutrophils Absolute 4.7 1.7 - 8.6 K/cmm    Lymphocytes Absolute 2.2 0.6 - 5.1 K/cmm    Monocytes Absolute 0.4 0.1 - 1.7 K/cmm    Eosinophils Absolute 0.3 0.0 - 0.8 K/cmm    BASO Absolute 0.1 0.0 - 0.3 K/cmm   Sedimentation rate (ESR)    Collection Time: 05/19/15  9:25 AM   Result Value Ref Range    Sed Rate 35 (H) 0 - 20 mm/hr   C Reactive Protein     Collection Time: 05/19/15  9:25 AM   Result Value Ref Range    C-Reactive Protein 0.11 0.02 - 0.80 mg/dL   Comprehensive metabolic panel    Collection Time: 07/03/15  2:46 PM   Result Value Ref Range    Sodium 137 136 - 147 mMol/L    Potassium 4.7 3.5 - 5.3 mMol/L    Chloride 104 98 - 110 mMol/L    CO2 22.8 20.0 - 30.0 mMol/L    Calcium 8.8 8.5 - 10.5 mg/dL    Glucose 246 (H) 70 - 99 mg/dL    Creatinine 1.20 0.80 - 1.30 mg/dL    BUN 28 (H) 7 - 22 mg/dL    Protein, Total 6.7 6.0 - 8.3 gm/dL    Albumin 2.9 (L) 3.5 - 5.0 gm/dL    Alkaline Phosphatase 79 40 - 145 U/L    ALT 21 0 - 55 U/L    AST (SGOT) 14 10 - 42 U/L    Bilirubin, Total 0.3 0.1 - 1.2 mg/dL    Albumin/Globulin Ratio 0.75 0.70 - 1.50 Ratio    Anion Gap 14.8 7.0 - 18.0 mMol/L    BUN/Creatinine Ratio 23.3 10.0 - 30.0 Ratio    EGFR 69 60 - 150 mL/min/1.68m2    Osmolality Calc 288 275 - 300 mOsm/kg  Globulin 3.8 2.0 - 4.0 gm/dL   Hemoglobin A1C    Collection Time: 07/03/15  2:46 PM   Result Value Ref Range    Hgb A1C, % 9.6 %          Assessment:     1. Type 2 diabetes mellitus with complication, with long-term current use of insulin    2. Essential hypertension         Plan:   No orders of the defined types were placed in this encounter.        Requested Prescriptions     Signed Prescriptions Disp Refills   . Empagliflozin-Linagliptin (GLYXAMBI) 10-5 MG Tab 30 tablet 11     Sig: Take 1 tablet by mouth daily.   Marland Kitchen lisinopril (PRINIVIL,ZESTRIL) 20 MG tablet 135 tablet 3     Sig: Take 1.5 tablets (30 mg total) by mouth nightly.       Return in about 3 months (around 10/09/2015).    Patient Instructions   1. Increase your lantus to 32 units.  Novolog sliding scale continue present dose  2. We will start glyxambi which will most likely need to get a prior authorization.  You need to take this with a lot of water  3. Increase you lisinopril to 1 1/2 tablet daily 1 week after you start the glyxambi       Patient was counseled on possible medicine side effects which  may include rash, swelling and/or stomach upset.  Patient was instructed to notify me or the ER if they experience problems.    Discussed with patient all diagnostics and consults ordered and addressed patient concerns prior to the completion of the visit.  Patient will be notified of results when they become available.  Encouraged to call for results if no contact within 14 days.     The patient's electronic medical record was reviewed, any changes in the past medical history, past surgical history, medications, diagnostic tests were noted, and the record was updated accordingly.    Discussed option available for my chart which provides electronic access to diagnostic results.        Signed by: Rhunette Croft, MD

## 2015-08-05 NOTE — Telephone Encounter (Signed)
error 

## 2015-10-08 ENCOUNTER — Telehealth (INDEPENDENT_AMBULATORY_CARE_PROVIDER_SITE_OTHER): Payer: Self-pay

## 2015-10-08 NOTE — Telephone Encounter (Signed)
Patient called asking for a letter for his job stating he needs to work day shift. 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

## 2015-10-09 ENCOUNTER — Ambulatory Visit (INDEPENDENT_AMBULATORY_CARE_PROVIDER_SITE_OTHER): Payer: BC Managed Care – PPO | Admitting: Family Medicine

## 2015-10-15 ENCOUNTER — Encounter (INDEPENDENT_AMBULATORY_CARE_PROVIDER_SITE_OTHER): Payer: Self-pay | Admitting: Family Medicine

## 2015-10-15 NOTE — Telephone Encounter (Signed)
Please notify patient that he can pick up the letter.  Thanks!

## 2015-10-21 NOTE — Telephone Encounter (Signed)
Left patient message to advise him that he can pick up the letter.

## 2016-01-21 ENCOUNTER — Encounter (RURAL_HEALTH_CENTER): Payer: Self-pay | Admitting: Family

## 2016-01-21 ENCOUNTER — Ambulatory Visit: Payer: Self-pay | Attending: Family | Admitting: Family

## 2016-01-21 VITALS — BP 140/78 | HR 104 | Temp 99.1°F | Resp 16 | Wt 250.0 lb

## 2016-01-21 DIAGNOSIS — J069 Acute upper respiratory infection, unspecified: Secondary | ICD-10-CM

## 2016-01-21 DIAGNOSIS — R05 Cough: Secondary | ICD-10-CM

## 2016-01-21 DIAGNOSIS — R059 Cough, unspecified: Secondary | ICD-10-CM

## 2016-01-21 DIAGNOSIS — Z720 Tobacco use: Secondary | ICD-10-CM

## 2016-01-21 MED ORDER — BENZONATATE 100 MG PO CAPS
100.0000 mg | ORAL_CAPSULE | Freq: Three times a day (TID) | ORAL | 1 refills | Status: DC | PRN
Start: 2016-01-21 — End: 2016-03-04

## 2016-01-21 MED ORDER — AZITHROMYCIN 250 MG PO TABS
250.0000 mg | ORAL_TABLET | Freq: Every day | ORAL | 0 refills | Status: AC
Start: 2016-01-21 — End: 2016-01-26

## 2016-01-21 NOTE — Progress Notes (Signed)
Subjective:    Patient ID: [name] is a 50 y.o. male with a PMHx significant for DMII and smoking who presents today for URI x3 days.    URI    This is a new problem. The current episode started in the past 7 days. The problem has been gradually worsening. Maximum temperature: subjective. The fever has been present for 1 to 2 days. Associated symptoms include congestion and coughing. Pertinent negatives include no chest pain, ear pain, rhinorrhea, sore throat, vomiting or wheezing. Treatments tried: nyquil. The treatment provided mild relief.   Coughing up brown sputum. No hemoptysis. No long car rides or recent travel.  The following portions of the patient's history were reviewed and updated as appropriate: allergies, current medications, past medical history, past social history and problem list.    Review of Systems   HENT: Positive for congestion. Negative for ear pain, rhinorrhea and sore throat.    Respiratory: Positive for cough. Negative for wheezing.    Cardiovascular: Negative for chest pain.   Gastrointestinal: Negative for vomiting.         Objective:    Physical Exam   Constitutional: He is oriented to person, place, and time. He appears well-developed and well-nourished. No distress.   HENT:   Head: Normocephalic and atraumatic.   Right Ear: Hearing, tympanic membrane, external ear and ear canal normal.   Left Ear: Hearing, tympanic membrane, external ear and ear canal normal.   Mouth/Throat: Uvula is midline and mucous membranes are normal. Posterior oropharyngeal edema and posterior oropharyngeal erythema present. No tonsillar exudate.   Eyes: EOM are normal. No scleral icterus.   Neck: Neck supple.   Cardiovascular: Normal rate, regular rhythm and normal heart sounds.  Exam reveals no gallop and no friction rub.    No murmur heard.  Pulmonary/Chest: Effort normal. No respiratory distress. He has no wheezes. He has rales in the left lower field.   Musculoskeletal: He exhibits no edema.    Neurological: He is alert and oriented to person, place, and time.   Skin: Skin is warm and dry. Capillary refill takes less than 2 seconds. No rash noted. He is not diaphoretic. No erythema. No pallor.   Psychiatric: He has a normal mood and affect. His behavior is normal. Judgment and thought content normal.     Vitals:    01/21/16 1649   BP: 140/78   BP Site: Left arm   Patient Position: Sitting   Cuff Size: Large   Pulse: (!) 104   Resp: 16   Temp: 99.1 F (37.3 C)   TempSrc: Tympanic   SpO2: 96%   Weight: 113.4 kg (250 lb)           Assessment:       1. Cough    2. Upper respiratory tract infection, unspecified type          Plan:       1. Cough  - benzonatate (TESSALON PERLES) 100 MG capsule; Take 1 capsule (100 mg total) by mouth 3 (three) times daily as needed for Cough.  Dispense: 30 capsule; Refill: 1    2. Upper respiratory tract infection, unspecified type  - azithromycin (ZITHROMAX) 250 MG tablet; Take 1 tablet (250 mg total) by mouth daily.for 5 days Take two tablets on day one and then one tablet daily for 4 more days.  Dispense: 6 tablet; Refill: 0    Tobacco abuse.   Counsel given.    Given comorbidities, tachycardia, fever and  symptoms will treat as above. Given strict follow up precautions and also time off of work. Will follow up with PCP in 48-72 hours for no improvement. Given education on risks of meds and benefits.   Alcus Bradly PA-C, 01/21/2016, 5:20 PM

## 2016-03-04 ENCOUNTER — Inpatient Hospital Stay: Payer: BC Managed Care – PPO | Admitting: Internal Medicine

## 2016-03-04 ENCOUNTER — Inpatient Hospital Stay
Admission: EM | Admit: 2016-03-04 | Discharge: 2016-03-10 | DRG: 854 | Disposition: A | Discharge status: Home Health | Payer: BC Managed Care – PPO | Attending: Internal Medicine | Admitting: Internal Medicine

## 2016-03-04 ENCOUNTER — Emergency Department: Payer: BC Managed Care – PPO

## 2016-03-04 DIAGNOSIS — M86172 Other acute osteomyelitis, left ankle and foot: Secondary | ICD-10-CM | POA: Diagnosis present

## 2016-03-04 DIAGNOSIS — M86672 Other chronic osteomyelitis, left ankle and foot: Secondary | ICD-10-CM | POA: Diagnosis present

## 2016-03-04 DIAGNOSIS — M609 Myositis, unspecified: Secondary | ICD-10-CM | POA: Diagnosis present

## 2016-03-04 DIAGNOSIS — A419 Sepsis, unspecified organism: Principal | ICD-10-CM | POA: Diagnosis present

## 2016-03-04 DIAGNOSIS — L97529 Non-pressure chronic ulcer of other part of left foot with unspecified severity: Secondary | ICD-10-CM | POA: Diagnosis present

## 2016-03-04 DIAGNOSIS — M869 Osteomyelitis, unspecified: Secondary | ICD-10-CM | POA: Diagnosis present

## 2016-03-04 DIAGNOSIS — E11628 Type 2 diabetes mellitus with other skin complications: Secondary | ICD-10-CM

## 2016-03-04 DIAGNOSIS — E785 Hyperlipidemia, unspecified: Secondary | ICD-10-CM | POA: Diagnosis present

## 2016-03-04 DIAGNOSIS — E1165 Type 2 diabetes mellitus with hyperglycemia: Secondary | ICD-10-CM | POA: Diagnosis present

## 2016-03-04 DIAGNOSIS — E11621 Type 2 diabetes mellitus with foot ulcer: Secondary | ICD-10-CM | POA: Diagnosis present

## 2016-03-04 DIAGNOSIS — E860 Dehydration: Secondary | ICD-10-CM | POA: Diagnosis present

## 2016-03-04 DIAGNOSIS — M84475A Pathological fracture, left foot, initial encounter for fracture: Secondary | ICD-10-CM | POA: Diagnosis present

## 2016-03-04 DIAGNOSIS — E1140 Type 2 diabetes mellitus with diabetic neuropathy, unspecified: Secondary | ICD-10-CM | POA: Diagnosis present

## 2016-03-04 DIAGNOSIS — Z8249 Family history of ischemic heart disease and other diseases of the circulatory system: Secondary | ICD-10-CM

## 2016-03-04 DIAGNOSIS — L97229 Non-pressure chronic ulcer of left calf with unspecified severity: Secondary | ICD-10-CM | POA: Diagnosis present

## 2016-03-04 DIAGNOSIS — Z833 Family history of diabetes mellitus: Secondary | ICD-10-CM

## 2016-03-04 DIAGNOSIS — E1122 Type 2 diabetes mellitus with diabetic chronic kidney disease: Secondary | ICD-10-CM | POA: Diagnosis present

## 2016-03-04 DIAGNOSIS — I129 Hypertensive chronic kidney disease with stage 1 through stage 4 chronic kidney disease, or unspecified chronic kidney disease: Secondary | ICD-10-CM | POA: Diagnosis present

## 2016-03-04 DIAGNOSIS — N179 Acute kidney failure, unspecified: Secondary | ICD-10-CM | POA: Diagnosis present

## 2016-03-04 DIAGNOSIS — K219 Gastro-esophageal reflux disease without esophagitis: Secondary | ICD-10-CM | POA: Diagnosis present

## 2016-03-04 DIAGNOSIS — E1169 Type 2 diabetes mellitus with other specified complication: Secondary | ICD-10-CM | POA: Diagnosis present

## 2016-03-04 DIAGNOSIS — Z9114 Patient's other noncompliance with medication regimen: Secondary | ICD-10-CM

## 2016-03-04 DIAGNOSIS — Z818 Family history of other mental and behavioral disorders: Secondary | ICD-10-CM

## 2016-03-04 DIAGNOSIS — Z91120 Patient's intentional underdosing of medication regimen due to financial hardship: Secondary | ICD-10-CM

## 2016-03-04 DIAGNOSIS — L03116 Cellulitis of left lower limb: Secondary | ICD-10-CM | POA: Diagnosis present

## 2016-03-04 DIAGNOSIS — I10 Essential (primary) hypertension: Secondary | ICD-10-CM

## 2016-03-04 DIAGNOSIS — Z794 Long term (current) use of insulin: Secondary | ICD-10-CM

## 2016-03-04 DIAGNOSIS — G4730 Sleep apnea, unspecified: Secondary | ICD-10-CM | POA: Diagnosis present

## 2016-03-04 DIAGNOSIS — N189 Chronic kidney disease, unspecified: Secondary | ICD-10-CM | POA: Diagnosis present

## 2016-03-04 DIAGNOSIS — Z887 Allergy status to serum and vaccine status: Secondary | ICD-10-CM

## 2016-03-04 DIAGNOSIS — Z7982 Long term (current) use of aspirin: Secondary | ICD-10-CM

## 2016-03-04 DIAGNOSIS — T383X6A Underdosing of insulin and oral hypoglycemic [antidiabetic] drugs, initial encounter: Secondary | ICD-10-CM | POA: Diagnosis present

## 2016-03-04 DIAGNOSIS — E876 Hypokalemia: Secondary | ICD-10-CM | POA: Diagnosis not present

## 2016-03-04 DIAGNOSIS — Z825 Family history of asthma and other chronic lower respiratory diseases: Secondary | ICD-10-CM

## 2016-03-04 LAB — BASIC METABOLIC PANEL
Anion Gap: 12.8 mMol/L (ref 7.0–18.0)
Anion Gap: 9.6 mMol/L (ref 7.0–18.0)
BUN / Creatinine Ratio: 19.7 Ratio (ref 10.0–30.0)
BUN / Creatinine Ratio: 23.9 Ratio (ref 10.0–30.0)
BUN: 26 mg/dL — ABNORMAL HIGH (ref 7–22)
BUN: 27 mg/dL — ABNORMAL HIGH (ref 7–22)
CO2: 25.1 mMol/L (ref 20.0–30.0)
CO2: 23.5 mMol/L (ref 20.0–30.0)
Calcium: 8.8 mg/dL (ref 8.5–10.5)
Calcium: 7.8 mg/dL — ABNORMAL LOW (ref 8.5–10.5)
Chloride: 100 mMol/L (ref 98–110)
Chloride: 103 mMol/L (ref 98–110)
Creatinine: 1.32 mg/dL — ABNORMAL HIGH (ref 0.80–1.30)
Creatinine: 1.13 mg/dL (ref 0.80–1.30)
EGFR: 62 mL/min/{1.73_m2} (ref 60–150)
EGFR: 74 mL/min/{1.73_m2} (ref 60–150)
Glucose: 450 mg/dL — ABNORMAL HIGH (ref 70–99)
Glucose: 511 mg/dL — ABNORMAL HIGH (ref 70–99)
Osmolality Calc: 293 mOsm/kg (ref 275–300)
Osmolality Calc: 293 mOsm/kg (ref 275–300)
Potassium: 3.9 mMol/L (ref 3.5–5.3)
Potassium: 4.1 mMol/L (ref 3.5–5.3)
Sodium: 134 mMol/L — ABNORMAL LOW (ref 136–147)
Sodium: 132 mMol/L — ABNORMAL LOW (ref 136–147)

## 2016-03-04 LAB — ECG 12-LEAD
P Wave Axis: -19 deg
P-R Interval: 160 ms
Patient Age: 50 years
Q-T Interval(Corrected): 451 ms
Q-T Interval: 324 ms
QRS Axis: -10 deg
QRS Duration: 90 ms
T Axis: 12 years
Ventricular Rate: 116 //min

## 2016-03-04 LAB — CBC AND DIFFERENTIAL
Basophils %: 0 % (ref 0.0–3.0)
Basophils Absolute: 0 10*3/uL (ref 0.0–0.3)
Eosinophils %: 1.2 % (ref 0.0–7.0)
Eosinophils Absolute: 0.1 10*3/uL (ref 0.0–0.8)
Hematocrit: 38.9 % — ABNORMAL LOW (ref 39.0–52.5)
Hemoglobin: 13 gm/dL (ref 13.0–17.5)
Lymphocytes Absolute: 1.1 10*3/uL (ref 0.6–5.1)
Lymphocytes: 9.9 % — ABNORMAL LOW (ref 15.0–46.0)
MCH: 30 pg (ref 28–35)
MCHC: 34 gm/dL (ref 32–36)
MCV: 89 fL (ref 80–100)
MPV: 8.8 fL (ref 6.0–10.0)
Monocytes Absolute: 0.6 10*3/uL (ref 0.1–1.7)
Monocytes: 5.9 % (ref 3.0–15.0)
Neutrophils %: 82.9 % — ABNORMAL HIGH (ref 42.0–78.0)
Neutrophils Absolute: 8.9 10*3/uL — ABNORMAL HIGH (ref 1.7–8.6)
PLT CT: 198 10*3/uL (ref 130–440)
RBC: 4.35 10*6/uL (ref 4.00–5.70)
RDW: 12.4 % (ref 11.0–14.0)
WBC: 10.8 10*3/uL (ref 4.0–11.0)

## 2016-03-04 LAB — I-STAT CHEM 8 CARTRIDGE
Anion Gap I-Stat: 15 (ref 7–16)
BUN I-Stat: 26 mg/dL — ABNORMAL HIGH (ref 6–20)
Calcium Ionized I-Stat: 4.3 mg/dL — ABNORMAL LOW (ref 4.35–5.10)
Chloride I-Stat: 98 mMol/L (ref 98–112)
Creatinine I-Stat: 1.2 mg/dL (ref 0.90–1.30)
EGFR: 69 mL/min/{1.73_m2} (ref 60–150)
Glucose I-Stat: 424 mg/dL — ABNORMAL HIGH (ref 70–99)
Hematocrit I-Stat: 39 % (ref 39.0–52.5)
Hemoglobin I-Stat: 13.3 gm/dL (ref 13.0–17.5)
Potassium I-Stat: 3.8 mMol/L (ref 3.5–5.3)
Sodium I-Stat: 135 mMol/L (ref 135–145)
TCO2 I-Stat: 26 mMol/L (ref 24–29)

## 2016-03-04 LAB — VH DEXTROSE STICK GLUCOSE
Glucose POCT: 400 mg/dL — ABNORMAL HIGH (ref 70–99)
Glucose POCT: 487 mg/dL — ABNORMAL HIGH (ref 70–99)

## 2016-03-04 LAB — VH INFLUENZA A/B RAPID TEST
Influenza A: NEGATIVE
Influenza B: NEGATIVE

## 2016-03-04 LAB — I-STAT LACTIC ACID
Lactic Acid I-Stat: 1.07 mMol/L (ref 0.50–2.10)
Room Number I-Stat: 44

## 2016-03-04 LAB — VH I-STAT LACTIC ACID NOTIFICATION

## 2016-03-04 LAB — VH I-STAT CHEM 8 NOTIFICATION

## 2016-03-04 MED ORDER — INSULIN ASPART 100 UNIT/ML SC SOPN
2.0000 [IU] | PEN_INJECTOR | Freq: Three times a day (TID) | SUBCUTANEOUS | Status: DC | PRN
Start: 2016-03-04 — End: 2016-03-10
  Administered 2016-03-05: 2 [IU] via SUBCUTANEOUS
  Administered 2016-03-05 – 2016-03-06 (×3): 4 [IU] via SUBCUTANEOUS
  Administered 2016-03-06 (×2): 2 [IU] via SUBCUTANEOUS

## 2016-03-04 MED ORDER — ACETAMINOPHEN 500 MG PO TABS
ORAL_TABLET | ORAL | Status: AC
Start: 2016-03-04 — End: ?
  Filled 2016-03-04: qty 2

## 2016-03-04 MED ORDER — ACETAMINOPHEN 500 MG PO TABS
1000.0000 mg | ORAL_TABLET | Freq: Once | ORAL | Status: AC
Start: 2016-03-04 — End: 2016-03-04
  Administered 2016-03-04: 1000 mg via ORAL

## 2016-03-04 MED ORDER — VH VANCOMYCIN THERAPY PLACEHOLDER
Status: DC
Start: 2016-03-04 — End: 2016-03-09

## 2016-03-04 MED ORDER — INSULIN REGULAR HUMAN 100 UNIT/ML IJ SOLN
10.0000 [IU] | Freq: Once | INTRAMUSCULAR | Status: AC
Start: 2016-03-04 — End: 2016-03-04
  Administered 2016-03-04: 10 [IU] via INTRAVENOUS
  Filled 2016-03-04: qty 3

## 2016-03-04 MED ORDER — FAMOTIDINE 20 MG PO TABS
20.0000 mg | ORAL_TABLET | Freq: Every day | ORAL | Status: DC
Start: 2016-03-04 — End: 2016-03-10
  Administered 2016-03-04 – 2016-03-10 (×7): 20 mg via ORAL
  Filled 2016-03-04 (×9): qty 1

## 2016-03-04 MED ORDER — SODIUM CHLORIDE 0.9 % IV SOLN
INTRAVENOUS | Status: DC
Start: 2016-03-04 — End: 2016-03-06

## 2016-03-04 MED ORDER — VANCOMYCIN HCL IN DEXTROSE 1-5 GM/200ML-% IV SOLN
1000.0000 mg | Freq: Two times a day (BID) | INTRAVENOUS | Status: DC
Start: 2016-03-04 — End: 2016-03-06
  Administered 2016-03-04 – 2016-03-06 (×4): 1000 mg via INTRAVENOUS
  Filled 2016-03-04 (×5): qty 1000

## 2016-03-04 MED ORDER — INSULIN REGULAR HUMAN 100 UNIT/ML IJ SOLN
10.0000 [IU] | Freq: Once | INTRAMUSCULAR | Status: AC
Start: 2016-03-04 — End: 2016-03-04
  Administered 2016-03-04: 10 [IU] via INTRAVENOUS

## 2016-03-04 MED ORDER — PIPERACILLIN-TAZOBACTAM IN DEX 3-0.375 GM/50ML IV SOLN
3.3750 g | Freq: Four times a day (QID) | INTRAVENOUS | Status: DC
Start: 2016-03-04 — End: 2016-03-08
  Administered 2016-03-05 – 2016-03-08 (×14): 3.375 g via INTRAVENOUS
  Filled 2016-03-04 (×14): qty 50

## 2016-03-04 MED ORDER — LIDOCAINE HCL 2 % EX GEL
CUTANEOUS | Status: AC
Start: 2016-03-04 — End: ?
  Filled 2016-03-04: qty 0.5

## 2016-03-04 MED ORDER — SODIUM CHLORIDE 0.9 % IV BOLUS
1000.0000 mL | Freq: Once | INTRAVENOUS | Status: DC
Start: 2016-03-04 — End: 2016-03-10

## 2016-03-04 MED ORDER — PIPERACILLIN-TAZOBACTAM IN DEX 3-0.375 GM/50ML IV SOLN
3.3750 g | Freq: Once | INTRAVENOUS | Status: AC
Start: 2016-03-04 — End: 2016-03-04
  Administered 2016-03-04: 3.375 g via INTRAVENOUS

## 2016-03-04 MED ORDER — INSULIN REGULAR HUMAN 100 UNIT/ML IJ SOLN
INTRAMUSCULAR | Status: AC
Start: 2016-03-04 — End: ?
  Filled 2016-03-04: qty 3

## 2016-03-04 MED ORDER — PIPERACILLIN-TAZOBACTAM IN DEX 3-0.375 GM/50ML IV SOLN
INTRAVENOUS | Status: AC
Start: 2016-03-04 — End: ?
  Filled 2016-03-04: qty 50

## 2016-03-04 MED ORDER — ACETAMINOPHEN 325 MG PO TABS
650.0000 mg | ORAL_TABLET | ORAL | Status: DC | PRN
Start: 2016-03-04 — End: 2016-03-10
  Administered 2016-03-05 – 2016-03-07 (×2): 650 mg via ORAL
  Filled 2016-03-04 (×2): qty 2

## 2016-03-04 MED ORDER — GLUCAGON 1 MG IJ SOLR (WRAP)
1.0000 mg | INTRAMUSCULAR | Status: DC | PRN
Start: 2016-03-04 — End: 2016-03-10

## 2016-03-04 MED ORDER — ASPIRIN 81 MG PO CHEW
81.0000 mg | CHEWABLE_TABLET | Freq: Every day | ORAL | Status: DC
Start: 2016-03-04 — End: 2016-03-10
  Administered 2016-03-04 – 2016-03-10 (×6): 81 mg via ORAL
  Filled 2016-03-04 (×8): qty 1

## 2016-03-04 MED ORDER — SODIUM CHLORIDE 0.9 % IV BOLUS
1000.0000 mL | Freq: Once | INTRAVENOUS | Status: AC
Start: 2016-03-04 — End: 2016-03-04
  Administered 2016-03-04: 1000 mL via INTRAVENOUS

## 2016-03-04 MED ORDER — VH INSULIN (REGULAR) INFUSION 250 UNIT/250 ML (SIMPLE)
0.5000 [IU]/h | Status: DC
Start: 2016-03-05 — End: 2016-03-05
  Administered 2016-03-05 (×2): 18 [IU]/h via INTRAVENOUS
  Administered 2016-03-05: 1.5 [IU]/h via INTRAVENOUS
  Administered 2016-03-05: 1 [IU]/h via INTRAVENOUS
  Filled 2016-03-04 (×2): qty 250

## 2016-03-04 MED ORDER — ACETAMINOPHEN 160 MG/5ML PO SOLN
650.0000 mg | ORAL | Status: DC | PRN
Start: 2016-03-04 — End: 2016-03-10

## 2016-03-04 MED ORDER — VH PIPERACILLIN-TAZOBACTAM 3.375 G EXTENDED INFUSION (SIMPLE)
3.3750 g | Freq: Three times a day (TID) | Status: DC
Start: 2016-03-04 — End: 2016-03-04

## 2016-03-04 MED ORDER — VH BIO-K PLUS PROBIOTIC 50 BIL CFU CAPSULE
50.0000 | DELAYED_RELEASE_CAPSULE | Freq: Every day | ORAL | Status: DC
Start: 2016-03-04 — End: 2016-03-10
  Administered 2016-03-04 – 2016-03-10 (×6): 50 via ORAL
  Filled 2016-03-04 (×6): qty 1

## 2016-03-04 MED ORDER — HEPARIN SODIUM (PORCINE) PF 5000 UNIT/0.5ML IJ SOLN
5000.0000 [IU] | Freq: Three times a day (TID) | INTRAMUSCULAR | Status: DC
Start: 2016-03-04 — End: 2016-03-10
  Administered 2016-03-04 – 2016-03-10 (×17): 5000 [IU] via SUBCUTANEOUS
  Filled 2016-03-04 (×24): qty 0.5

## 2016-03-04 MED ORDER — INSULIN ASPART 100 UNIT/ML SC SOPN
1.0000 [IU] | PEN_INJECTOR | Freq: Every evening | SUBCUTANEOUS | Status: DC | PRN
Start: 2016-03-04 — End: 2016-03-10
  Administered 2016-03-05 – 2016-03-06 (×2): 1 [IU] via SUBCUTANEOUS
  Administered 2016-03-06: 2 [IU] via SUBCUTANEOUS
  Administered 2016-03-07: 4 [IU] via SUBCUTANEOUS
  Administered 2016-03-08: 2 [IU] via SUBCUTANEOUS

## 2016-03-04 MED ORDER — LIDOCAINE(URO-JET) 2% JELLY (WRAP)
CUTANEOUS | Status: DC | PRN
Start: 2016-03-04 — End: 2016-03-10

## 2016-03-04 MED ORDER — VANCOMYCIN HCL IN DEXTROSE 1-5 GM/200ML-% IV SOLN
1000.0000 mg | Freq: Once | INTRAVENOUS | Status: DC
Start: 2016-03-04 — End: 2016-03-04

## 2016-03-04 MED ORDER — ATORVASTATIN CALCIUM 10 MG PO TABS
10.0000 mg | ORAL_TABLET | Freq: Every evening | ORAL | Status: DC
Start: 2016-03-04 — End: 2016-03-10
  Administered 2016-03-04 – 2016-03-09 (×6): 10 mg via ORAL
  Filled 2016-03-04 (×8): qty 1

## 2016-03-04 MED ORDER — ACETAMINOPHEN 650 MG RE SUPP
650.0000 mg | RECTAL | Status: DC | PRN
Start: 2016-03-04 — End: 2016-03-10

## 2016-03-04 MED ORDER — CYCLOBENZAPRINE HCL 10 MG PO TABS
10.0000 mg | ORAL_TABLET | Freq: Three times a day (TID) | ORAL | Status: DC | PRN
Start: 2016-03-04 — End: 2016-03-10
  Administered 2016-03-05: 10 mg via ORAL
  Filled 2016-03-04: qty 1

## 2016-03-04 MED ORDER — GABAPENTIN 300 MG PO CAPS
300.0000 mg | ORAL_CAPSULE | Freq: Three times a day (TID) | ORAL | Status: DC
Start: 2016-03-04 — End: 2016-03-10
  Administered 2016-03-04 – 2016-03-10 (×16): 300 mg via ORAL
  Filled 2016-03-04 (×21): qty 1

## 2016-03-04 MED ORDER — INSULIN ASPART 100 UNIT/ML SC SOPN
7.0000 [IU] | PEN_INJECTOR | Freq: Three times a day (TID) | SUBCUTANEOUS | Status: DC
Start: 2016-03-05 — End: 2016-03-04

## 2016-03-04 MED ORDER — LANTUS SOLOSTAR 100 UNIT/ML SC SOPN
30.0000 [IU] | PEN_INJECTOR | Freq: Every evening | SUBCUTANEOUS | Status: DC
Start: 2016-03-04 — End: 2016-03-05
  Administered 2016-03-04: 30 [IU] via SUBCUTANEOUS
  Filled 2016-03-04: qty 3

## 2016-03-04 NOTE — Progress Notes (Signed)
Called by charge nurse who reports a MEWS score of 4.  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  Blood sugars have been elevated.  Patient is on Vancomycin and Zosyn IV.  NS is infusing at 150 ml's/hr.  Lactic acid 1.07 today.  No intervention needed at this time.    Ferne Coe RN  Rapid Response Team

## 2016-03-04 NOTE — Progress Notes (Addendum)
Pt BG was 487. Dr. Corbin Ade notified, 10 units of regular insulin IV ordered, continue with 30 units of lanuts, recheck in 1 hour, BMP ordrered. MD also ordered NPO for now. Will continue to monitor

## 2016-03-04 NOTE — Consults (Signed)
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  Cellulitis left foot.  Diabetic ulcer left foot.  DM-2 with neuropathy, uncontrolled    Plan:   Consult complete.  MRI left foot to evaluate for deep space abscess.  He will likely require incision and drainage pending MRI findings.  Explained limb salvage to patient in detail.  Cont IV abx, ID consulted.    History:   [name] is a 51 y.o. male who presents to the hospital on 03/04/2016 with a diabetic foot infection with evidence of osteomyelitis on xray.  He is well know to me after history of prior left foot infection that required surgical intervention and long term IV abx about a year ago.  He has not been seen for f/u because he lost his insurance.  He notes a poor appetite.  Denies fever or chills.     Past Medical History:     Past Medical History:   Diagnosis Date   . Complication of anesthesia     last time he was kicking his legs had to wake him up   . Diabetes mellitus    . Diabetic ulcer of left foot 12/26/14   . Disc    . Gastroesophageal reflux disease    . Hyperlipidemia    . Hypertension    . Seasonal allergic rhinitis    . Sleep apnea     not tested   . Type 2 diabetes mellitus, controlled        Past Surgical History:     Past Surgical History:   Procedure Laterality Date   . ARTHRODESIS, TOE Left 05/04/2015    Procedure: ARTHRODESIS, TOE;  Surgeon: Janora Norlander, DPM;  Location: [facility];  Service: Podiatry;  Laterality: Left;  1st MPJ FUSION   . DEBRIDEMENT & IRRIGATION, LOWER EXTREMITY Left 03/12/2015    Procedure: [place], LOWER EXTREMITY;  Surgeon: Janora Norlander, DPM;  Location: [facility];  Service: Podiatry;  Laterality: Left;  I&D LEFT FOOT FOR BONE INFECTION   . HAND  SURGERY         Family History:     Family History   Problem Relation Age of Onset   . Diabetes Mother    . Depression Sister    . COPD Sister    . Hypertension Brother        Social History:     Social History     Social History   . Marital status: Single     Spouse name: N/A   . Number of children: N/A   . Years of education: N/A     Social History Main Topics   . Smoking status: Never Smoker   . Smokeless tobacco: Never Used   . Alcohol use No   . Drug use: No   . Sexual activity: Not on file     Other Topics Concern   . Not on file     Social History Narrative   . No narrative on file       Allergies:     Allergies   Allergen Reactions   . Flu Virus Vaccine Anaphylaxis       Medications:     Current Facility-Administered Medications   Medication  Dose Route Frequency   . sodium chloride  1,000 mL Intravenous Once in ED   . vancomycin  1,000 mg Intravenous Once in ED       Review of Systems:   A comprehensive review of systems was: History obtained from the patient  Endocrine ROS: negative  Respiratory ROS: negative  Cardiovascular ROS: negative  Gastrointestinal ROS: negative  Musculoskeletal ROS: negative  Neurological ROS: negative  Dermatological ROS: blister on left leg with history of multiple red spots on his left leg    Physical Exam:     Vitals:    03/04/16 1837   BP: 157/76   Pulse: (!) 123   Resp: 18   Temp: 99.8 F (37.7 C)   SpO2: 99%       Intake and Output Summary (Last 24 hours) at Date Time    Intake/Output Summary (Last 24 hours) at 03/04/16 1840  Last data filed at 03/04/16 1758   Gross per 24 hour   Intake               50 ml   Output                0 ml   Net               50 ml       General appearance - alert, well appearing, and in no distress  Musculoskeletal - Left LE edema  Extremities - Edema left foot.  There is a full thickness ulceration under the 2nd metatarsal head with yellow draining pus.  Cultures taken in ED.  There is edema, warmth and erythema to the left foot ankle and LE.   There is a partial thickness ulceration at the medial left lower leg with surrounding erythema.  No pus from this site.     Labs Reviewed:     Results     Procedure Component Value Units Date/Time    Wound Culture and Smear [[id number]] Collected:  03/04/16 1748    Specimen:  Wound from Left Foot Updated:  03/04/16 1757    Influenza A / B Rapid Test [[id number]] Collected:  03/04/16 1715    Specimen:  Nasal Wash Updated:  03/04/16 1741     Influenza A Negative     Influenza B Negative    Narrative:       Influenza A antigen detection tests are unable to distinquish between novel and seasonal influenza A.    A negative result for either Influenza A or B antigen does not exclude influenza virus infection. Clinical correlation required.    All positive influenza antigen tests (A or B) require placement of patient on droplet precaution isolation.    Basic Metabolic Panel [[id number]]  (Abnormal) Collected:  03/04/16 1715    Specimen:  Plasma Updated:  03/04/16 1739     Sodium 134 (L) mMol/L      Potassium 3.9 mMol/L      Chloride 100 mMol/L      CO2 25.1 mMol/L      Calcium 8.8 mg/dL      Glucose 450 (H) mg/dL      Creatinine 1.32 (H) mg/dL      BUN 26 (H) mg/dL      Anion Gap 12.8 mMol/L      BUN/Creatinine Ratio 19.7 Ratio      EGFR 62 mL/min/1.8m2      Osmolality Calc 293 mOsm/kg     i-Stat Lactic AcID [[id number]] Collected:  03/04/16 1732  Specimen:  Venipuncture Updated:  03/04/16 1736     Room Number, ISTAT 44     Sample, ISTAT Venous     Site, ISTAT OTHER     i-STAT Lactic acid 1.07 mMol/L     i-Stat Chem 8 Cartr[place] [[id number]]  (Abnormal) Collected:  03/04/16 1732    Specimen:  Blood Updated:  03/04/16 1735     i-STAT Sodium 135 mMol/L      i-STAT Potassium 3.8 mMol/L      i-STAT Chloride 98 mMol/L      TCO2, ISTAT 26 mMol/L      Ionized Ca, ISTAT 4.30 (L) mg/dL      i-STAT Glucose 424 (H) mg/dL      i-STAT Creatinine 1.20 mg/dL      i-STAT BUN 26 (H) mg/dL      Anion Gap, ISTAT 15.0     EGFR 69 mL/min/1.45m2       i-STAT Hematocrit 39.0 %      i-STAT Hemoglobin 13.3 gm/dL     I-Stat Lactic Acid [[id number]] Collected:  03/04/16 1715    Specimen:  ISTAT Updated:  03/04/16 1730     I-STAT Notification Istat Notification    I-Stat Chem 8 [[id number]] Collected:  03/04/16 1715    Specimen:  ISTAT Updated:  03/04/16 1730     I-STAT Notification Istat Notification    CBC [[id number]]  (Abnormal) Collected:  03/04/16 1715    Specimen:  Blood from Blood Updated:  03/04/16 1727     WBC 10.8 K/cmm      RBC 4.35 M/cmm      Hemoglobin 13.0 gm/dL      Hematocrit 38.9 (L) %      MCV 89 fL      MCH 30 pg      MCHC 34 gm/dL      RDW 12.4 %      PLT CT 198 K/cmm      MPV 8.8 fL      NEUTROPHIL % 82.9 (H) %      Lymphocytes 9.9 (L) %      Monocytes 5.9 %      Eosinophils % 1.2 %      Basophils % 0.0 %      Neutrophils Absolute 8.9 (H) K/cmm      Lymphocytes Absolute 1.1 K/cmm      Monocytes Absolute 0.6 K/cmm      Eosinophils Absolute 0.1 K/cmm      BASO Absolute 0.0 K/cmm     Blood Culture - Venipuncture # 1 [[id number]] Collected:  03/04/16 1715    Specimen:  Blood from Venipuncture Updated:  03/04/16 1718          Recent BMP   Recent Labs      03/04/16   1732  03/04/16   1715   Glucose   --   450*   BUN   --   26*   Creatinine   --   1.32*   i-STAT Creatinine  1.20   --    Calcium   --   8.8   Sodium   --   134*   Potassium   --   3.9   Chloride   --   100   CO2   --   25.1     Recent URINALYSIS WITH MICROSCOPIC Invalid input(s): UMICB, UWBC, URBC, USEPI, UREPI, UTEPI, UTRPH, UAMOR, UCAOX, URACY, UCAPH, UCABC, UWBCC, UBAC, UTRIC, UABCY, [place], [place], [place], [place], [place], [place], [place], [place], [place],  UWBCS, URBST, UGRCS, URCST, UHYCS, UYST, UMUCS, UMICP, UOTH, UAOFB, UBROC, [place], USULF, [place], UFCST, UWCST, UWBCS, [place], [place], [place], [place], [place], [place], [place], [place], [place], [place], [place]  Recent CBC WITH DIFF   Recent Labs      03/04/16   1715   RBC  4.35   Hemoglobin  13.0   Hematocrit  38.9*   MCV  89   MCHC  34   RDW  12.4   MPV  8.8        Rads:   Radiological Procedure reviewed.     Signed by: Janora Norlander, DPM

## 2016-03-04 NOTE — Progress Notes (Signed)
Pharmacy Vancomycin Dosing Consult Note  [name]    Assessment:   1. Vancomycin day #1 (also on pip/tazo) in a 51 YOM who presents with diabetic ulcer on his foot.  Patient stopped taking his insulin.  Blood sugars uncontrolled and patient denies trauma. Over the past 3 weeks patient noticed worsening erythema of the left left now extending to his knees. Podiatry and ID consulted. X-ray of foot concerning for osteomyelitis and MRI pending. WBC wnl, febrile (Tmax = 38.3 C), SCr elevated and higher than baseline.      Plan:   1. Start vancomycin 1000 mg IV every 12 hours  2. Trough ordered for 1/21 at 1900 prior to the 5th dose  3. Pharmacy will follow the patient's renal function, vancomycin levels, and dosing during the course of therapy. If you have any questions, please contact the pharmacist at [phone].     Indication: Osteomyelitis left foot    Age: 51 y.o.  Height: 1.702 m (5\' 7" )  Weight:  100.4 kg (221 lb 5.5 oz)  IBW: 66.1 kg  DW: 80 kg    CrCL: 74 ml/min    Other Nephrotoxic Drugs: pip/tazo    Drug Levels:   Trough level ordered for 1/21 at 1900     Pharmacokinetics:   t50: 10.5 hrs         Kel: 0.06585 hr-1        Vd: 56 L         Expected Trough: 14.8 mg/L                Recent Labs  Lab 03/04/16  1732 03/04/16  1715   Creatinine  --  1.32*   i-STAT Creatinine 1.20  --    BUN  --  26*       Recent Labs  Lab 03/04/16  1715   WBC 10.8     Temp (24hrs), Avg:100.2 F (37.9 C), Min:99.8 F (37.7 C), Max:101 F (38.3 C)      Cultures:   Date Source Organism Sensitivities Resistance

## 2016-03-04 NOTE — ED Notes (Signed)
MD at bedside. 

## 2016-03-04 NOTE — Progress Notes (Signed)
Pt stated he felt feverish. Vitals were taken and HR was elevated at 118 and temperature was 100.6, indicating an orange MEWS. QRS was notified and made aware of increased blood sugar as well.

## 2016-03-04 NOTE — Progress Notes (Signed)
Persistently high blood Sugar despite of IV insulin was transferred to stepdown for IV insulin

## 2016-03-04 NOTE — H&P (Signed)
Patient: [name]  Date: 03/04/2016   DOB: Dec 31, 1965  Admission Date: 03/04/2016   MRN: [id number]  Attending:Shaakira Borrero MD      CODE STATUS: full code    PRIMARY CARE MD: Stephen Croft, MD    Chief Complaint   Patient presents with   . Foot Injury     diabetic ulcer bottom of foot          HISTORY AND PHYSICAL     [name] is a 51 y.o. male with history of diabetes who's not taking his insulin since he ran out of his insurance and cannot afford the insulin presents to the emergency department with worsening left foot infection. Patient denied any inciting trauma. He does have diabetes and his blood sugars have been uncontrolled. Over the past 3 weeks he says worsening erythema of the left leg which is now started to extend proximally to started in the feet but now extends  All the way up to the knees.  The redness and pain has progressively gotten worse over the past 3 weeks. Patient has not seen a primary care doctor or a podiatrist for this he's not been on antibiotic therapy.  Patient does admit to high-grade subjective  Fevers and chills at home  Although he denies checking his temperature. Patient has been treated for osteomyelitis patient at that point underwent excision of first metatarsal head about a year ago cultures from bone at that point grew streptococcus patient was  Kept in 6 weeks of IV cefazolin. patient denies any infection in the meantime.  In the emergency department patient was found to be febrile with a MAXIMUM TEMPERATURE of 101 lactic acid was within normal limit cultures have been drawn  Patient aggressively hydrated.  X-ray performed  Was consistent with osteomyelitis. Patient's being admitted for  Further management.      Past Medical History:   Diagnosis Date   . Complication of anesthesia     last time he was kicking his legs had to wake him up   . Diabetes mellitus    . Diabetic ulcer of left foot  12/26/14   . Disc    . Gastroesophageal reflux disease    . Hyperlipidemia    . Hypertension    . Seasonal allergic rhinitis    . Sleep apnea     not tested   . Type 2 diabetes mellitus, controlled        Past Surgical History:   Procedure Laterality Date   . ARTHRODESIS, TOE Left 05/04/2015    Procedure: ARTHRODESIS, TOE;  Surgeon: Janora Norlander, DPM;  Location: [facility];  Service: Podiatry;  Laterality: Left;  1st MPJ FUSION   . DEBRIDEMENT & IRRIGATION, LOWER EXTREMITY Left 03/12/2015    Procedure: Lawn, LOWER EXTREMITY;  Surgeon: Janora Norlander, DPM;  Location: [facility];  Service: Podiatry;  Laterality: Left;  I&D LEFT FOOT FOR BONE INFECTION   . HAND SURGERY              Prior to Admission medications    Medication Sig Start Date End Date Taking? Authorizing Provider   aspirin EC 81 MG EC tablet Take 81 mg by mouth every evening.   Yes [provider]   oxymetazoline (AFRIN) 0.05 % nasal spray 2 sprays by Nasal route 2 (two) times daily as needed for Congestion.   Yes [provider]   aspirin (ASPIRIN CHILDRENS) 81 MG chewable tablet Chew 1 tablet (  81 mg total) by mouth daily.  Patient taking differently: Chew 81 mg by mouth every evening.     07/24/14 03/04/16 Yes Stephen Croft, MD   atorvastatin (LIPITOR) 10 MG tablet Take 10 mg by mouth nightly.    [provider]   cyclobenzaprine (FLEXERIL) 10 MG tablet Take 1 tablet (10 mg total) by mouth every 8 (eight) hours as needed for Muscle spasms (pain or muscle spasm). 05/27/14   Melynda Keller, PA   gabapentin (NEURONTIN) 300 MG capsule Take 1 capsule (300 mg total) by mouth 3 (three) times daily. 02/12/15   Stephen Croft, MD   insulin aspart (NOVOLOG FLEXPEN) 100 UNIT/ML injection pen Inject 7 Units into the skin 3 (three) times daily before meals. 02/20/15   Stephen Croft, MD   insulin aspart (NOVOLOG) 100 UNIT/ML injection pen Inject 2-12 Units into the  skin every 6 (six) hours as needed for High Blood Sugar. 03/17/15   Goyal, Jani Gravel, MD   insulin aspart (NOVOLOG) 100 UNIT/ML injection pen Inject 1-4 Units into the skin nightly as needed for High Blood Sugar. 03/17/15   Charmian Muff, MD   insulin glargine (LANTUS SOLOSTAR) 100 UNIT/ML injection pen Inject 28 Units into the skin every morning. 03/17/15   Goyal, Jani Gravel, MD   lisinopril (PRINIVIL,ZESTRIL) 20 MG tablet Take 1.5 tablets (30 mg total) by mouth nightly. 07/09/15   Stephen Croft, MD   metFORMIN (GLUCOPHAGE) 1000 MG tablet Take 1 tablet (1,000 mg total) by mouth 2 (two) times daily with meals. 10/10/14   Stephen Croft, MD   benzonatate (TESSALON PERLES) 100 MG capsule Take 1 capsule (100 mg total) by mouth 3 (three) times daily as needed for Cough. 01/21/16 03/04/16  Cardillo, Carina, PA   Empagliflozin-Linagliptin (GLYXAMBI) 10-5 MG Tab Take 1 tablet by mouth daily. 07/09/15 03/04/16  Stephen Croft, MD   famotidine (PEPCID) 20 MG tablet Take 1 tablet (20 mg total) by mouth daily. 03/17/15 03/04/16  Charmian Muff, MD       Allergies   Allergen Reactions   . Flu Virus Vaccine Anaphylaxis       Family History   Problem Relation Age of Onset   . Diabetes Mother    . Depression Sister    . COPD Sister    . Hypertension Brother        Social History   Substance Use Topics   . Smoking status: Never Smoker   . Smokeless tobacco: Never Used   . Alcohol use No       REVIEW OF SYSTEMS     All  systems were reviewed and are negative unless pertinent positive stated in  HPI.    CONSTITUTIONAL: No night sweats. No fatigue, malaise, lethargy. No fever or chills.   HEENT: Eyes: No visual changes. No eye pain. No eye discharge.   ENT: No runny nose. No epistaxis. No sinus pain. No sore throat. No odynophagia. No ear pain. No congestion.   BREASTS: No breast pain, soreness, lumps, or discharge.   RESPIRATORY: No cough. No wheeze. No hemoptysis. No shortness of breath.      CARDIOVASCULAR: No chest pains. No palpitations.   GASTROINTESTINAL: No abdominal pain. No nausea or vomiting. No diarrhea or constipation. No hematemesis. No hematochezia. No melena.   GENITOURINARY: No urgency. No frequency. No dysuria. No hematuria. No obstructive symptoms. No discharge. No pain. No significant abnormal bleeding.   NEUROLOGICAL:  No headache or neck pain. No  syncope or seizure.   PSYCHIATRIC: No depression no psychosis.  SKIN: No rashes. No lesions. No wounds.   ENDOCRINE: No unexplained weight loss. No polydipsia. No polyuria. No polyphagia.   HEMATOLOGIC: No anemia. No purpura. No petechiae. No prolonged or excessive bleeding.   ALLERGIC AND IMMUNOLOGIC: No pruritus. No swelling      PHYSICAL EXAM     Vital Signs (most recent): BP 130/60   Pulse (!) 114   Temp (!) 101 F (38.3 C) (Tympanic)   Resp 19   Ht 1.702 m (5\' 7" )   Wt 100.4 kg (221 lb 5.5 oz)   SpO2 98%   BMI 34.67 kg/m     Wt Readings from Last 4 Encounters:   03/04/16 100.4 kg (221 lb 5.5 oz)   01/21/16 113.4 kg (250 lb)   07/09/15 112 kg (247 lb)   05/04/15 104.3 kg (230 lb)       1) General Appearance:  In no apparent acute distress      2) HEENT: Head is atraumatic and normocephalic.   Eyes: Pink conjunctiva, anicteric sclera. Pupils are equally reactive to light and  accommodation. Extraocular muscles are intact.  ENT:  Patient has intact external auditory canal. No abnormal lesions or bleeding from  nose. Oral mucosa moist with no pharyngeal congestion, erythema or swelling.    3) Neck: Supple, with full range of motion without any discomfort. Trachea is central,   no JVD noted, no carotid bruit, no cervical masses palpable.    4) Chest: Clear to auscultation bilaterally, no adventitial sounds heard.    5) CVS:  S1, S2 normal intensity and regular rhythm. No murmurs, rubs or gallops  appreciated.     6) Abdomen: Soft, non tender, non distended, no palpable mass. Bowel sounds audible.  No costovertebral angle tenderness  noted.    7) Extremities: marked left lower extremity erythema with tenderness shallow ulcer in the calf plantar ulcer with                             Necrotic base.      8) Skin: Warm with normal skin turgor, no lesions seen.    9) Lymphatics: No lymphadenopathy in axillary, cervial and inguinal area.     10) Neurological: Alert and oriented x 4. Cranial nerves II-XII intact. No gross focal  neurological deficits noted.    11) Psychiatric: Mood and affect are appropriate.     LABS & IMAGING     Recent Results (from the past 24 hour(s))   ECG 12 lead (Specify reason for exam)    Collection Time: 03/04/16  5:02 PM   Result Value Ref Range    Patient Age 67 years    Patient DOB [birth year]     Patient Height      Patient Weight      Interpretation Text       Sinus tachycardia  Compared to ECG 03/12/2015 17:14:29  Sinus rhythm no longer present    Electronically Signed On 03-04-2016 18:02:58 EST by Earlene Plater      Physician Interpreter [facility]     Ventricular Rate 116 //min    QRS Duration 90 ms    P-R Interval 160 ms    Q-T Interval 324 ms    Q-T Interval(Corrected) 451 ms    P Wave Axis -19 deg    QRS Axis -10 deg    T Axis 12 years  CBC    Collection Time: 03/04/16  5:15 PM   Result Value Ref Range    WBC 10.8 4.0 - 11.0 K/cmm    RBC 4.35 4.00 - 5.70 M/cmm    Hemoglobin 13.0 13.0 - 17.5 gm/dL    Hematocrit 38.9 (L) 39.0 - 52.5 %    MCV 89 80 - 100 fL    MCH 30 28 - 35 pg    MCHC 34 32 - 36 gm/dL    RDW 12.4 11.0 - 14.0 %    PLT CT 198 130 - 440 K/cmm    MPV 8.8 6.0 - 10.0 fL    NEUTROPHIL % 82.9 (H) 42.0 - 78.0 %    Lymphocytes 9.9 (L) 15.0 - 46.0 %    Monocytes 5.9 3.0 - 15.0 %    Eosinophils % 1.2 0.0 - 7.0 %    Basophils % 0.0 0.0 - 3.0 %    Neutrophils Absolute 8.9 (H) 1.7 - 8.6 K/cmm    Lymphocytes Absolute 1.1 0.6 - 5.1 K/cmm    Monocytes Absolute 0.6 0.1 - 1.7 K/cmm    Eosinophils Absolute 0.1 0.0 - 0.8 K/cmm    BASO Absolute 0.0 0.0 - 0.3 K/cmm   Basic Metabolic Panel    Collection Time:  03/04/16  5:15 PM   Result Value Ref Range    Sodium 134 (L) 136 - 147 mMol/L    Potassium 3.9 3.5 - 5.3 mMol/L    Chloride 100 98 - 110 mMol/L    CO2 25.1 20.0 - 30.0 mMol/L    Calcium 8.8 8.5 - 10.5 mg/dL    Glucose 450 (H) 70 - 99 mg/dL    Creatinine 1.32 (H) 0.80 - 1.30 mg/dL    BUN 26 (H) 7 - 22 mg/dL    Anion Gap 12.8 7.0 - 18.0 mMol/L    BUN/Creatinine Ratio 19.7 10.0 - 30.0 Ratio    EGFR 62 60 - 150 mL/min/1.48m2    Osmolality Calc 293 275 - 300 mOsm/kg   I-Stat Lactic Acid    Collection Time: 03/04/16  5:15 PM   Result Value Ref Range    I-STAT Notification Istat Notification    Influenza A / B Rapid Test    Collection Time: 03/04/16  5:15 PM   Result Value Ref Range    Influenza A Negative Negative    Influenza B Negative Negative   I-Stat Chem 8    Collection Time: 03/04/16  5:15 PM   Result Value Ref Range    I-STAT Notification Istat Notification    i-Stat Chem 8 CartrIDge    Collection Time: 03/04/16  5:32 PM   Result Value Ref Range    i-STAT Sodium 135 135 - 145 mMol/L    i-STAT Potassium 3.8 3.5 - 5.3 mMol/L    i-STAT Chloride 98 98 - 112 mMol/L    TCO2, ISTAT 26 24 - 29 mMol/L    Ionized Ca, ISTAT 4.30 (L) 4.35 - 5.10 mg/dL    i-STAT Glucose 424 (H) 70 - 99 mg/dL    i-STAT Creatinine 1.20 0.90 - 1.30 mg/dL    i-STAT BUN 26 (H) 6 - 20 mg/dL    Anion Gap, ISTAT 15.0 7 - 16    EGFR 69 60 - 150 mL/min/1.40m2    i-STAT Hematocrit 39.0 39.0 - 52.5 %    i-STAT Hemoglobin 13.3 13.0 - 17.5 gm/dL   i-Stat Lactic AcID    Collection Time: 03/04/16  5:32 PM   Result Value  Ref Range    Room Number, ISTAT 44     Sample, ISTAT Venous     Site, ISTAT OTHER     i-STAT Lactic acid 1.07 0.50 - 2.10 mMol/L       MICROBIOLOGY:  Blood Culture: done  Urine Culture: not done      IMAGING:  XR FOOT LEFT AP LATERAL AND OBLIQUE   Final Result   There is resorption of the bone either side of previously seen first left metatarsal osteotomy, concerning for osteomyelitis.   There is also a possible fracture or erosion through the  distal neck of the fourth metatarsal bone of the left foot.   There is medial subluxation at the second metatarsophalangeal joint of the left foot, possibly neuropathic.   There is diffuse left foot soft tissue swelling.      ReadingStation:[id number]              Markers:        EMERGENCY DEPARTMENT COURSE:  Orders Placed This Encounter   Procedures   . Blood Culture - Venipuncture # 1   . Wound Culture and Smear   . Influenza A / B Rapid Test   . XR FOOT LEFT AP LATERAL AND OBLIQUE   . CBC   . Basic Metabolic Panel   . I-Stat Lactic Acid   . I-Stat Chem 8   . POCT GLucose   . POCT GLucose   . Notify provider for glucose results less than 40 mg/dl or greater than 350 mg/dl.   Geralyn Corwin provider for glucose results greater than 40 mg/dl and less than 70 mg/dl if the patient is due for a scheduled dose of insulin or oral diabetic medication   . Notify provider if patient is not eating or vomiting   . Notify provider for glucose greater than 250 for 6 hours or after 3 consecutive glucose results, whichever is sooner   . Notify provider for blood glucose consistently 350 or higher for possible insulin infusion initiation   . Assess and document percentage of meals eaten   . Give bedtime snack if HS POCT glucose is less than 120 mg/dL   . NSG Communication: Glucose POCT order (AC and HS)   . NSG Communication: Glucose POCT order (PRN hypoglycemia)   . NSG Communication: Glucose POCT order (if bedtime glucose <120 or >300, or if correction insulin is administered at bedtime, retest at 0300 and use HS correction dose prn)   . Simple carbohydrate for glucose less than 70.   . Inpatient consult to Infectious Diseases   . Inpatient consult to Podiatry   . i-Stat Chem 8 CartrIDge   . i-Stat Lactic AcID   . ECG 12 lead (Specify reason for exam)   . Admit to Inpatient   . Bed Request   . ED Admission Request       ASSESSMENT & PLAN     BURHAN BARHAM is a 51 y.o. male with above PMH and PSH who presented to the emergency  department with worsening left lower extremity pain and erythema.    - Sepsis secondary to left lower extremity cellulitis and associated osteomyelitis x-ray reviewed cultures have been drawn start the patient on IV Zosyn and vancomycin patient has been given  1 L bolus in the emergency department discussed with the ER nurse 1 more liter of bolus ordered  Will continue with aggressive IV hydration    - Uncontrolled diabetes currently not in DKA, patient will be started  on Lantus 30 units tonight and keep on pre-meal insulin also start the patient on  High-dose sliding scale.    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 Hold lisinopril and metformin for now    - diabetic neuropathy on gabapentin    - DVT prophylaxis    - Case discussed with the podiatrist and infectious disease    - Admitted as an inpatient        Health Care Proxy- patient's girlfriend Redmond Pulling    Signed,  Jerrell Mylar, MD - I can be reached at [phone].  03/04/2016 6:09 PM    CC: PCP and all specialists listed above    NOTE: This record is partially electronically generated (including the use of voice recognition technology) and may contain additions and omissions unintended by user.

## 2016-03-04 NOTE — Plan of Care (Signed)
Problem: Safety  Goal: Patient will be free from injury during hospitalization  Outcome: Progressing   03/04/16 2329   Goal/Interventions addressed this shift   Patient will be free from injury during hospitalization  Assess patient's risk for falls and implement fall prevention plan of care per policy;Provide and maintain safe environment;Use appropriate transfer methods;Ensure appropriate safety devices are available at the bedside;Include patient/ family/ care giver in decisions related to safety;Hourly rounding;Assess for patients risk for elopement and implement Silvis per policy;Provide alternative method of communication if needed (communication boards, writing)       Problem: Pain  Goal: Pain at adequate level as identified by patient  Outcome: Progressing   03/04/16 2329   Goal/Interventions addressed this shift   Pain at adequate level as identified by patient Identify patient comfort function goal;Assess for risk of opioid induced respiratory depression, including snoring/sleep apnea. Alert healthcare team of risk factors identified.;Assess pain on admission, during daily assessment and/or before any "as needed" intervention(s);Reassess pain within 30-60 minutes of any procedure/intervention, per Pain Assessment, Intervention, Reassessment (AIR) Cycle;Evaluate if patient comfort function goal is met;Evaluate patient's satisfaction with pain management progress;Offer non-pharmacological pain management interventions;Include patient/patient care companion in decisions related to pain management as needed   Pt not having pain at this time

## 2016-03-04 NOTE — ED Provider Notes (Signed)
[facility]  History and Physical Exam       Patient Name: [name], [name]  Encounter Date:  03/04/2016  Attending Physician: Leslee Home, MD  PCP: Rhunette Croft, MD  Patient DOB:  Jun 16, 1965  MRN:  [id number]  Room:  X32/G40-N      History of Presenting Illness     Chief complaint: Foot Injury (diabetic ulcer bottom of foot)      HPI/ROS is limited by: none  HPI/ROS given by: Patient    [name] is a 51 y.o. male who presents with left foot injury for 1 week(s). Symptoms have been continuous. Pain is reported to be a 8/10 at this time. Symptoms have been worsened by weight bearing and relieved by none.  There was not radiation.  Associated symptoms are chills, fever, cough, wound, and numbness.    Pt states that he has had his first left metatarsal removed and replaced with a cadaver bone. Pt states that he is a diabetic. Pt states that he had a callous on his left foot which has turned into a wound. Pt states that his sugars have been high. Pt states that he lost his insurance so he has not been taking his insulin.     Review of Systems     Review of Systems   Constitutional: Positive for chills and fever.   Eyes: Negative for visual disturbance.   Respiratory: Positive for cough.    Gastrointestinal: Negative for diarrhea and vomiting.   Skin: Positive for wound.   Neurological: Positive for numbness (neuropathy).   All other systems reviewed and are negative.       Allergies & Medications     Pt is allergic to flu virus vaccine.    Home Meds: EMR link not correct; nurses' notes reviewed for meds and dosages     Past Medical History     Pt has a past medical history of Complication of anesthesia; Diabetes mellitus; Diabetic ulcer of left foot (12/26/14); Disc; Gastroesophageal reflux disease; Hyperlipidemia; Hypertension; Seasonal allergic rhinitis; Sleep apnea; and Type 2 diabetes mellitus, controlled.     Past Surgical History     Pt  has a past  surgical history that includes Hand surgery; DEBRIDEMENT & IRRIGATION, LOWER EXTREMITY (Left, 03/12/2015); and ARTHRODESIS, TOE (Left, 05/04/2015).     Family History     The family history includes COPD in his sister; Depression in his sister; Diabetes in his mother; Hypertension in his brother.     Social History     Pt reports that he has never smoked. He has never used smokeless tobacco. He reports that he does not drink alcohol or use drugs.     Physical Exam     Blood pressure 130/60, pulse (!) 114, temperature (!) 101 F (38.3 C), temperature source Tympanic, resp. rate 19, height 1.702 m, weight 100.4 kg, SpO2 98 %.    Physical Exam   Constitutional: He is well-developed, well-nourished, and in no distress. No distress.   HENT:   Head: Normocephalic and atraumatic.   Right Ear: External ear normal.   Left Ear: External ear normal.   Mouth/Throat: Oropharynx is clear and moist. Abnormal dentition. No oropharyngeal exudate.   Eyes: Conjunctivae and EOM are normal. Pupils are equal, round, and reactive to light. Right eye exhibits no discharge. Left eye exhibits no discharge.   Neck: Normal range of motion. Neck supple. No JVD present. No tracheal deviation present. No thyromegaly present.  Cardiovascular: Regular rhythm and intact distal pulses.  Tachycardia present.  Exam reveals no gallop and no friction rub.    Murmur heard.   Systolic (left sternal border) murmur is present with a grade of 2/6   Pulmonary/Chest: Effort normal and breath sounds normal. No stridor. No respiratory distress. He has no wheezes. He has no rales. He exhibits no tenderness.   Abdominal: Soft. Bowel sounds are normal. He exhibits no distension and no mass. There is no tenderness. There is no rebound and no guarding.   Musculoskeletal: Normal range of motion. He exhibits no edema or tenderness.   Neurological: He is alert. A sensory deficit (feet) is present.   Skin: Skin is warm and dry. No rash noted. No pallor.   3 x 1.5 cm wound  on medial left lower leg, brown base with surrounding redness  2.5 x 2 cm wound on left foot, white base, 2 cm deep   Psychiatric: Memory and affect normal.   Nursing note and vitals reviewed.       Diagnostic Results     The results of the diagnostic studies below have been reviewed by myself:    Labs  Labs Reviewed   CBC AND DIFFERENTIAL - Abnormal; Notable for the following:        Result Value    Hematocrit 38.9 (*)     NEUTROPHIL % 82.9 (*)     Lymphocytes 9.9 (*)     Neutrophils Absolute 8.9 (*)     All other components within normal limits   BASIC METABOLIC PANEL - Abnormal; Notable for the following:     Sodium 134 (*)     Glucose 450 (*)     Creatinine 1.32 (*)     BUN 26 (*)     All other components within normal limits   I-STAT CHEM 8 CARTRIDGE - Abnormal; Notable for the following:     Ionized Ca, ISTAT 4.30 (*)     i-STAT Glucose 424 (*)     i-STAT BUN 26 (*)     All other components within normal limits   VH INFLUENZA A/B RAPID TEST    Narrative:     Influenza A antigen detection tests are unable to distinquish between novel and seasonal influenza A.    A negative result for either Influenza A or B antigen does not exclude influenza virus infection. Clinical correlation required.    All positive influenza antigen tests (A or B) require placement of patient on droplet precaution isolation.   VH CULTURE-BLOOD-VENIPUNCTURE   VH CULTURE AND SMEAR, WOUND   VH I-STAT LACTIC ACID NOTIFICATION   VH I-STAT CHEM 8 NOTIFICATION   I-STAT LACTIC ACID       Radiologic Studies  Xr Foot Left Ap Lateral And Oblique    Result Date: 03/04/2016  There is resorption of the bone either side of previously seen first left metatarsal osteotomy, concerning for osteomyelitis. There is also a possible fracture or erosion through the distal neck of the fourth metatarsal bone of the left foot. There is medial subluxation at the second metatarsophalangeal joint of the left foot, possibly neuropathic. There is diffuse left foot soft  tissue swelling. ReadingStation:[id number]      EKG: Sinus tachycardia  Compared to ECG 03/12/2015 17:14:29  Sinus rhythm no longer present  HR: 116     ED Course & Treatment     Previous records reviewed.  D/dx, workup, anticipated clinical course discussed with patient/family.  Results reviewed with patient/family.  Patient appreciative of care.  All questions answered.  Patient/family comfortable with treatment plan.     1753: I spoke with Dr. Candiss Norse, he will admit the patient.     Medical Decision Making     The differential diagnosis includes, but is not limited to bursitis, tenosynovitis, myofascial strain, cellulitis, degenerative arthritis, gout, septic joint, pedal edema, tendonitis, plantar fasciitis, vascular occlusion, DVT, lumbar radiculopathy, sciatica, Baker's cyst    This chart was generated by an EMR and may contain errors or omissions not intended by the user.     Procedures / Critical Care     Procedure Note    The procedure performed was excisional debridement of the wound(s) located on the plantar left foot. The wound showed no signs of infection. The wound bed is described as having slough and devitalized tissue. I informed the patient of risks/benefits and they had the chance to ask questions. Appropriate consent was obtained. Allergies were checked and anesthetic was administered with topical 2% lidocaine gel. The area was prepped and draped in the usual manner and the procedure was performed in a clean field.  A "Time out" was performed to verify the correct patient, location, and procedure.     I debrided the wound with a #3 and #7 curettes under direct medical supervision and excised devitalized tissue and slough. After the procedure the wound base was white and pink. Post debridement measurements were essentially unchanged. Debridement was performed to the level of the subcutaneous tissue. Blood loss was minmal and was stopped with application of direct pressure. The patient did tolerated the  procedure well reporting pain of none during the procedure. The patient was educated on signs and symptoms of infection such as purulent drainage, malodor, edema, cellulitis, and significant pain. The patient was instructed to seek medical attention if these present.     Diagnosis / Disposition     Clinical Impression  1. Diabetic foot infection        Disposition  ED Disposition     ED Disposition Condition Date/Time Comment    Admit  Fri Mar 04, 2016  5:53 PM           Follow up for Discharged Patients  No follow-up provider specified.    Prescriptions for Discharged Patients  New Prescriptions    No medications on file       The documentation recorded by my scribe, Barbaraann Barthel, accurately reflects the services I personally performed and the decisions made by me.  Leslee Home, MD                  Leslee Home, MD  03/04/16 [facility]

## 2016-03-05 ENCOUNTER — Inpatient Hospital Stay: Payer: BC Managed Care – PPO

## 2016-03-05 LAB — CBC AND DIFFERENTIAL
Basophils %: 0.3 % (ref 0.0–3.0)
Basophils Absolute: 0 10*3/uL (ref 0.0–0.3)
Eosinophils %: 1.3 % (ref 0.0–7.0)
Eosinophils Absolute: 0.2 10*3/uL (ref 0.0–0.8)
Hematocrit: 34.9 % — ABNORMAL LOW (ref 39.0–52.5)
Hemoglobin: 11.7 gm/dL — ABNORMAL LOW (ref 13.0–17.5)
Lymphocytes Absolute: 2 10*3/uL (ref 0.6–5.1)
Lymphocytes: 17.3 % (ref 15.0–46.0)
MCH: 30 pg (ref 28–35)
MCHC: 34 gm/dL (ref 32–36)
MCV: 89 fL (ref 80–100)
MPV: 9.1 fL (ref 6.0–10.0)
Monocytes Absolute: 0.9 10*3/uL (ref 0.1–1.7)
Monocytes: 8.2 % (ref 3.0–15.0)
Neutrophils %: 73 % (ref 42.0–78.0)
Neutrophils Absolute: 8.4 10*3/uL (ref 1.7–8.6)
PLT CT: 177 10*3/uL (ref 130–440)
RBC: 3.92 10*6/uL — ABNORMAL LOW (ref 4.00–5.70)
RDW: 12.3 % (ref 11.0–14.0)
WBC: 11.5 10*3/uL — ABNORMAL HIGH (ref 4.0–11.0)

## 2016-03-05 LAB — BASIC METABOLIC PANEL
Anion Gap: 12 mMol/L (ref 7.0–18.0)
BUN / Creatinine Ratio: 21.9 Ratio (ref 10.0–30.0)
BUN: 21 mg/dL (ref 7–22)
CO2: 22.1 mMol/L (ref 20.0–30.0)
Calcium: 7.8 mg/dL — ABNORMAL LOW (ref 8.5–10.5)
Chloride: 106 mMol/L (ref 98–110)
Creatinine: 0.96 mg/dL (ref 0.80–1.30)
EGFR: 91 mL/min/{1.73_m2} (ref 60–150)
Glucose: 94 mg/dL (ref 70–99)
Osmolality Calc: 277 mOsm/kg (ref 275–300)
Potassium: 3.1 mMol/L — ABNORMAL LOW (ref 3.5–5.3)
Sodium: 137 mMol/L (ref 136–147)

## 2016-03-05 LAB — VH DEXTROSE STICK GLUCOSE
Glucose POCT: 412 mg/dL — ABNORMAL HIGH (ref 70–99)
Glucose POCT: 376 mg/dL — ABNORMAL HIGH (ref 70–99)
Glucose POCT: 115 mg/dL — ABNORMAL HIGH (ref 70–99)
Glucose POCT: 118 mg/dL — ABNORMAL HIGH (ref 70–99)
Glucose POCT: 115 mg/dL — ABNORMAL HIGH (ref 70–99)
Glucose POCT: 111 mg/dL — ABNORMAL HIGH (ref 70–99)
Glucose POCT: 119 mg/dL — ABNORMAL HIGH (ref 70–99)
Glucose POCT: 245 mg/dL — ABNORMAL HIGH (ref 70–99)
Glucose POCT: 270 mg/dL — ABNORMAL HIGH (ref 70–99)
Glucose POCT: 267 mg/dL — ABNORMAL HIGH (ref 70–99)
Glucose POCT: 289 mg/dL — ABNORMAL HIGH (ref 70–99)
Glucose POCT: 220 mg/dL — ABNORMAL HIGH (ref 70–99)
Glucose POCT: 184 mg/dL — ABNORMAL HIGH (ref 70–99)
Glucose POCT: 196 mg/dL — ABNORMAL HIGH (ref 70–99)

## 2016-03-05 MED ORDER — LANTUS SOLOSTAR 100 UNIT/ML SC SOPN
20.0000 [IU] | PEN_INJECTOR | Freq: Every morning | SUBCUTANEOUS | Status: DC
Start: 2016-03-05 — End: 2016-03-10
  Administered 2016-03-05 – 2016-03-10 (×6): 20 [IU] via SUBCUTANEOUS

## 2016-03-05 MED ORDER — VH POTASSIUM CHLORIDE CRYS ER 20 MEQ PO TBCR (WRAP)
40.0000 meq | EXTENDED_RELEASE_TABLET | Freq: Once | ORAL | Status: AC
Start: 2016-03-05 — End: 2016-03-05
  Administered 2016-03-05: 40 meq via ORAL
  Filled 2016-03-05: qty 2

## 2016-03-05 MED ORDER — INSULIN ASPART 100 UNIT/ML SC SOPN
7.0000 [IU] | PEN_INJECTOR | Freq: Three times a day (TID) | SUBCUTANEOUS | Status: DC
Start: 2016-03-05 — End: 2016-03-10
  Administered 2016-03-05 – 2016-03-10 (×15): 7 [IU] via SUBCUTANEOUS
  Filled 2016-03-05: qty 3

## 2016-03-05 MED ORDER — LANTUS SOLOSTAR 100 UNIT/ML SC SOPN
40.0000 [IU] | PEN_INJECTOR | Freq: Every evening | SUBCUTANEOUS | Status: DC
Start: 2016-03-05 — End: 2016-03-10
  Administered 2016-03-05 – 2016-03-09 (×5): 40 [IU] via SUBCUTANEOUS
  Filled 2016-03-05: qty 3

## 2016-03-05 MED ORDER — GADOBUTROL 1 MMOL/ML IV SOLN
10.0000 mL | Freq: Once | INTRAVENOUS | Status: AC | PRN
Start: 2016-03-05 — End: 2016-03-05
  Administered 2016-03-05: 10 mmol via INTRAVENOUS

## 2016-03-05 NOTE — Progress Notes (Signed)
Patient: [name]  Date: 03/05/2016   LOS: 1 Days  Admission Date: 03/04/2016   MRN: [id number]  Attending: Jerrell Mylar, MD     SUBJECTIVE     Patient seen and examined overnight events reviewed, complains of foot pain but no further fevers or chills      MEDICATIONS     Current Facility-Administered Medications   Medication Dose Route Frequency   . aspirin  81 mg Oral Daily   . atorvastatin  10 mg Oral QHS   . famotidine  20 mg Oral Daily   . gabapentin  300 mg Oral TID   . heparin (porcine)  5,000 Units Subcutaneous Q8H   . insulin aspart  7 Units Subcutaneous TID AC   . insulin glargine  20 Units Subcutaneous QAM   . insulin glargine  40 Units Subcutaneous QHS   . lactobacillus species  50 Billion CFU Oral Daily   . piperacillin-tazobactam  3.375 g Intravenous Q6H   . potassium chloride  40 mEq Oral Once   . sodium chloride  1,000 mL Intravenous Once in ED   . vancomycin  1,000 mg Intravenous Q12H   . vancomycin therapy placeholder   Does not apply See Admin Instructions     . sodium chloride 150 mL/hr at 03/05/16 0431           PHYSICAL EXAM     Objective:  I/O last 3 completed shifts:  In: 6962 [P.O.:450; I.V.:855; IV Piggyback:50]  Out: 950 [Urine:950]     Vitals:    03/05/16 0700 03/05/16 0701 03/05/16 0800 03/05/16 0900   BP: 147/73  149/81 126/58   Pulse: 98  (!) 104 (!) 102   Resp: 16  20 12    Temp: 97.8 F (36.6 C) 97.8 F (36.6 C)     TempSrc: Oral Oral     SpO2: 97%  97% 97%   Weight:       Height:         Wt Readings from Last 4 Encounters:   03/05/16 109 kg (240 lb 4.8 oz)   01/21/16 113.4 kg (250 lb)   07/09/15 112 kg (247 lb)   05/04/15 104.3 kg (230 lb)          Exam: Patient is awake, alert and oriented x 3  HEENT: PERRLA, EOMI  Neck: supple, without JVD  Chest: Bilateral fair air entry  CVS: S1, S2 regular rate and rhythm  no murmurs    Abdomen: soft and non tender with good bowel sounds.  Musculoskeletal: Left lower  extremity edema with erythema and tenderness ulcer on the plantar aspect of the foot with necrotic base pulses palpable  Skin: Warm dry no worrisome lesions  NEURO:  Cranial nerve II-12 intact no motor or sensory deficits  Psychiatric: alert, interactive, appropriate, normal affect      LABS       Recent Labs  Lab 03/05/16  0345 03/04/16  1715   WBC 11.5* 10.8   RBC 3.92* 4.35   Hemoglobin 11.7* 13.0   Hematocrit 34.9* 38.9*   MCV 89 89   PLT CT 177 198       Recent Labs  Lab 03/05/16  0345 03/04/16  2144 03/04/16  1732 03/04/16  1715   Sodium 137 132*  --  134*   Potassium 3.1* 4.1  --  3.9   Chloride 106 103  --  100   CO2 22.1 23.5  --  25.1   BUN 21 27*  --  26*   Creatinine 0.96 1.13  --  1.32*   i-STAT Creatinine  --   --  1.20  --    Glucose 94 511*  --  450*   Calcium 7.8* 7.8*  --  8.8               Invalid input(s): OSMOL          No results found for: BNP       (!) 267 (The above 1 analytes were performed by [facility] [phone]) [address],[place] [address] )  Invalid input(s): BLOODCULTURE      RADIOLOGY     Xr Foot Left Ap Lateral And Oblique    Result Date: 03/04/2016  There is resorption of the bone either side of previously seen first left metatarsal osteotomy, concerning for osteomyelitis. There is also a possible fracture or erosion through the distal neck of the fourth metatarsal bone of the left foot. There is medial subluxation at the second metatarsophalangeal joint of the left foot, possibly neuropathic. There is diffuse left foot soft tissue swelling. ReadingStation:[id number]        Assessment and Plan:     51 year old male with history of diabetes who has not been taking his insulin presented to the emergency department with worsening left foot pain and erythema found to have osteomyelitis with associated cellulitis.    - Sepsis secondary to first left metatarsal osteomyelitis also associated cellulitis cultures are being followed patient is being aggressively  hydrated and received 2 L of bolus on admission. Continue with IV Zosyn and vancomycin monitor vancomycin levels infectious disease evaluation also requested monitor temperature curve. Patient has been evaluated by podiatrist    MRI ordered he might need debridement.    - Uncontrolled diabetes overnight events reviewed patient's blood sugars in the high 400 on floor required IV insulin subsequently was transferred to stepdown unit. He was on insulin infusion for an hour to subsequently discontinued. Blood sugar is high secondary to sepsis. We will increase his nighttime dose of Lantus to 40 units also start the patient on Lantus 20 units in the morning with her dose to be given stat also start the patient on pre-meal insulin schedule and sliding scale insulin his insulin regimen will require adjustment while his in-house.    - Acute kidney injury resolved with aggressive IV hydration, lisinopril and metformin currently on hold    - Hypokalemia being repleted    - Medication noncompliance    - Diabetic neuropathy on gabapentin    - DVT prophylaxis          - Disposition- transfer out of stepdown unit     Jerrell Mylar, MD  03/05/2016 10:45 AM    I can be reached at [phone]    NOTE: This record is partially electronically generated (including the use of voice recognition technology) and may contain additions and omissions unintended by user.

## 2016-03-05 NOTE — Plan of Care (Signed)
Problem: Compromised Tissue integrity  Goal: Damaged tissue is healing and protected  Interventions:  1. Monitor/assess Braden scale every shift  2. Provide wound care per wound care algorithm  3. Reposition patient every 2 hours and as needed unless able to reposition self  4. Increase activity as tolerated/progressive mobility  5. Relieve pressure to bony prominences for patients at moderate and high risk  6. Avoid shearing injuries   7. Keep intact skin clean and dry  8. Use bath wipes, not soap and water, for daily bathing   9. Use incontinence wipes for cleaning urine, stool and caustic drainage; Foley care as needed   10. Monitor external devices/tubes for correct placement to prevent pressure, friction and shearing   11. Encourage use of lotion/moisturizer on skin  12. Monitor patient's hygiene practices  13. Consult/collaborate with wound care nurse   14. Utilize specialty bed  15. Consider placing an indwelling catheter if incontinence interferes with healing of stage 3 or 4 pressure injury   Outcome: Progressing  Patient is progressing well. No new skin breakdown. Diabetic ulcers covered in gauze and kerlix, clean dry and intact.

## 2016-03-05 NOTE — Progress Notes (Signed)
Pt arrived from Stepdown this shift  Pt has not required pain medication since arrival  Dressings are dry and intact.  IV intact  Pt is voiding WNL in the urinal/bathroom  Neuro-vascular assessment is intact.  Vital  signs reviewed:    Temp: 99.1 F (37.3 C), Heart Rate: 95, Resp Rate: 19, BP: 133/69    Report given to accepting RN     End of shift.

## 2016-03-05 NOTE — Progress Notes (Signed)
Receive report from night RN. Patient stable. CBG 119. Gave 7 units of meal time insulin. No sliding scale needed. Patient eating breakfast. Transfer orders in.

## 2016-03-05 NOTE — Progress Notes (Signed)
Rechecked CBG after novolog and lantus was given, CBG 267. Called Dr. Candiss Norse to make him aware. Ok to send to the floor. Report already called.

## 2016-03-05 NOTE — Progress Notes (Signed)
Pt arrived from Stepdown at 1045  Pt reports pain level of 0/10.  Pt BG is 279.    Assessment is complete and will monitor pt. with rounds.

## 2016-03-05 NOTE — Progress Notes (Signed)
Report given to stepdown RN, will transfer pt.

## 2016-03-05 NOTE — Progress Notes (Signed)
CBG 245, gave 4 units novolog per sliding scale. Called Dr. Netta Neat 20 units of Lantus now.

## 2016-03-05 NOTE — Progress Notes (Signed)
Patient report called to Abigail Butts, RN for room [phone] .  Patient transferred to new room via wheelchair and SD tech without complications.  VSS prior to transport.    Receiving RN to call this RN with any questions, comments or concerns on ext [phone]

## 2016-03-05 NOTE — Consults (Signed)
INFECTIOUS DISEASE CONSULT NOTE    Date Time: 03/05/16 1:05 PM  Patient Name: [name]  Attending Physician: Jerrell Mylar, MD    Reason for Consult: Diabetic foot infection    Subjective     CC: <principal problem not specified>  Active Problems:    Osteomyelitis      HPI/Subjective:   51 year old male with difficult to control diabetes. He been off insulin because he says it doesn't control his sugars and that they're variable. He was admitted with 3 week history of erythema of his left foot that extended up his left leg. He also has an ulcer on his left mid calf where his boot rubs. He has had previous pressure points develop in his feet. He works in a [employer] and has to wear rubber boots because of the moisture.Marland Kitchen He was seen in November 2016 with an ulcerated left first MTPJ area at that time he grew MSSA and was treated for 4 weeks with cefazolin 2 g every 8 hours through a PICC line.    Patient returned in January of the 2017 with progressive drainage after the antibiotics were stopped. He underwent resection of the first metatarsal head. He was again at that time treated with cefazolin for 42 days 2 g every 8 hours at that time he grew group B strep. He completed therapy in the wound heal    Review of Systems:   Is not having fever chills sweats at this time no polyuria polydipsia. No chest pain or shortness of breath. No nausea vomiting diarrhea. No cervical control his glucoses requiring insulin that placed in the critical care unit initially. He had stopped taking his insulin because he says it doesn't work although there is a compliance issue    Past Medical History:     Past Medical History:   Diagnosis Date   . Complication of anesthesia     last time he was kicking his legs had to wake him up   . Diabetes mellitus    . Diabetic ulcer of left foot 12/26/14   . Disc    . Gastroesophageal reflux disease    . Hyperlipidemia    . Hypertension    . Seasonal allergic rhinitis    .  Sleep apnea     not tested   . Type 2 diabetes mellitus, controlled        Past Surgical History:     Past Surgical History:   Procedure Laterality Date   . ARTHRODESIS, TOE Left 05/04/2015    Procedure: ARTHRODESIS, TOE;  Surgeon: Janora Norlander, DPM;  Location: [facility];  Service: Podiatry;  Laterality: Left;  1st MPJ FUSION   . DEBRIDEMENT & IRRIGATION, LOWER EXTREMITY Left 03/12/2015    Procedure: [place], LOWER EXTREMITY;  Surgeon: Janora Norlander, DPM;  Location: [facility];  Service: Podiatry;  Laterality: Left;  I&D LEFT FOOT FOR BONE INFECTION   . HAND SURGERY           Allergies:     Allergies   Allergen Reactions   . Flu Virus Vaccine Anaphylaxis       Social History:     Social History     Social History   . Marital status: Single     Spouse name: N/A   . Number of children: N/A   . Years of education: N/A     Occupational History   . Not on file.     Social History  Main Topics   . Smoking status: Never Smoker   . Smokeless tobacco: Never Used   . Alcohol use No   . Drug use: No   . Sexual activity: Not on file     Other Topics Concern   . Not on file     Social History Narrative   . No narrative on file           Family History:     Family History   Problem Relation Age of Onset   . Diabetes Mother    . Depression Sister    . COPD Sister    . Hypertension Brother        Physical Exam:   Temp:  [97.8 F (36.6 C)-101 F (38.3 C)] 98 F (36.7 C)  Heart Rate:  [94-123] 94  Resp Rate:  [12-22] 18  BP: (119-157)/(55-81) 150/76    Wt Readings from Last 3 Encounters:   03/05/16 109 kg (240 lb 4.8 oz)   01/21/16 113.4 kg (250 lb)   07/09/15 112 kg (247 lb)       Intake/Output Summary (Last 24 hours) at 03/05/16 1305  Last data filed at 03/05/16 0900   Gross per 24 hour   Intake             2201 ml   Output              950 ml   Net             1251 ml       Alert oriented does not appear in distress she is afebrile. HEENT shows cranial nerves II through XII  grossly intact. Clear conjunctiva. He has no oral ulcers or mucositis. No herpetic lesions on his lips. Neck supple without adenopathy. Lungs diminished breath sounds no wheezing or rhonchi. Cardiac exam reveals a rhythm is regular without a murmur or rub. Abdomen is adipose soft nontender no guarding or rebound no hepatosplenomegaly. Right foot shows callus formation over the first metatarsal point area but there is no open ulceration sensation is diminished in the foot but his dorsalis pedis pulse is 2/4 and symmetric. Left foot is a splay foot dorsalis pedis pulses palpable. He has marked erythema and induration over the dorsum. The plantar surface of the second metatarsal shows an ulcerated area also had the mid medial aspect of the shin there is a 5 cm superficial ulceration without significant surrounding cellulitis. At this point he does not have ascending cellulitis in his left shin.    Meds:     Current Facility-Administered Medications   Medication Dose Route Frequency   . aspirin  81 mg Oral Daily   . atorvastatin  10 mg Oral QHS   . famotidine  20 mg Oral Daily   . gabapentin  300 mg Oral TID   . heparin (porcine)  5,000 Units Subcutaneous Q8H   . insulin aspart  7 Units Subcutaneous TID AC   . insulin glargine  20 Units Subcutaneous QAM   . insulin glargine  40 Units Subcutaneous QHS   . lactobacillus species  50 Billion CFU Oral Daily   . piperacillin-tazobactam  3.375 g Intravenous Q6H   . sodium chloride  1,000 mL Intravenous Once in ED   . vancomycin  1,000 mg Intravenous Q12H   . vancomycin therapy placeholder   Does not apply See Admin Instructions     acetaminophen **OR** acetaminophen **OR** acetaminophen, cyclobenzaprine, glucagon (rDNA), insulin aspart, insulin aspart, lidocaine  .  sodium chloride 150 mL/hr at 03/05/16 0431           Labs:     Labs (last 24 hours):  Results     Procedure Component Value Units Date/Time    Dextrose Stick Glucose [[id number]]  (Abnormal) Collected:  03/05/16 1207     Specimen:  Blood Updated:  03/05/16 1226     Glucose, POCT 220 (H) mg/dL     Dextrose Stick Glucose [[id number]]  (Abnormal) Collected:  03/05/16 1047    Specimen:  Blood Updated:  03/05/16 1104     Glucose, POCT 289 (H) mg/dL     Dextrose Stick Glucose [[id number]]  (Abnormal) Collected:  03/05/16 0957    Specimen:  Blood Updated:  03/05/16 1014     Glucose, POCT 267 (H) mg/dL     Dextrose Stick Glucose [[id number]]  (Abnormal) Collected:  03/05/16 0927    Specimen:  Blood Updated:  03/05/16 0944     Glucose, POCT 270 (H) mg/dL     Dextrose Stick Glucose [[id number]]  (Abnormal) Collected:  03/05/16 0907    Specimen:  Blood Updated:  03/05/16 0924     Glucose, POCT 245 (H) mg/dL     Dextrose Stick Glucose [[id number]]  (Abnormal) Collected:  03/05/16 0737    Specimen:  Blood Updated:  03/05/16 0753     Glucose, POCT 119 (H) mg/dL     Dextrose Stick Glucose [[id number]]  (Abnormal) Collected:  03/05/16 0551    Specimen:  Blood Updated:  03/05/16 0607     Glucose, POCT 111 (H) mg/dL     Dextrose Stick Glucose [[id number]]  (Abnormal) Collected:  03/05/16 0459    Specimen:  Blood Updated:  03/05/16 0520     Glucose, POCT 115 (H) mg/dL     Basic Metabolic Panel [[id number]]  (Abnormal) Collected:  03/05/16 0345    Specimen:  Plasma Updated:  03/05/16 0501     Sodium 137 mMol/L      Potassium 3.1 (L) mMol/L      Chloride 106 mMol/L      CO2 22.1 mMol/L      Calcium 7.8 (L) mg/dL      Glucose 94 mg/dL      Creatinine 0.96 mg/dL      BUN 21 mg/dL      Anion Gap 12.0 mMol/L      BUN/Creatinine Ratio 21.9 Ratio      EGFR 91 mL/min/1.69m2      Osmolality Calc 277 mOsm/kg     CBC with differential [[id number]]  (Abnormal) Collected:  03/05/16 0345    Specimen:  Blood from Blood Updated:  03/05/16 0440     WBC 11.5 (H) K/cmm      RBC 3.92 (L) M/cmm      Hemoglobin 11.7 (L) gm/dL      Hematocrit 34.9 (L) %      MCV 89 fL      MCH 30 pg      MCHC 34 gm/dL      RDW 12.3 %      PLT CT 177 K/cmm      MPV 9.1 fL      NEUTROPHIL % 73.0 %       Lymphocytes 17.3 %      Monocytes 8.2 %      Eosinophils % 1.3 %      Basophils % 0.3 %      Neutrophils Absolute 8.4 K/cmm      Lymphocytes Absolute 2.0 K/cmm  Monocytes Absolute 0.9 K/cmm      Eosinophils Absolute 0.2 K/cmm      BASO Absolute 0.0 K/cmm     Dextrose Stick Glucose [[id number]]  (Abnormal) Collected:  03/05/16 0352    Specimen:  Blood Updated:  03/05/16 0434     Glucose, POCT 118 (H) mg/dL     Dextrose Stick Glucose [[id number]]  (Abnormal) Collected:  03/05/16 0253    Specimen:  Blood Updated:  03/05/16 0314     Glucose, POCT 115 (H) mg/dL     Dextrose Stick Glucose [[id number]]  (Abnormal) Collected:  03/05/16 0145    Specimen:  Blood Updated:  03/05/16 0204     Glucose, POCT 376 (H) mg/dL     Dextrose Stick Glucose [[id number]]  (Abnormal) Collected:  03/05/16 0105    Specimen:  Blood Updated:  03/05/16 0128     Glucose, POCT 412 (H) mg/dL     Dextrose Stick Glucose [[id number]]  (Abnormal) Collected:  03/04/16 2334    Specimen:  Blood Updated:  03/04/16 2350     Glucose, POCT 400 (H) mg/dL     Blood Culture - Venipuncture # 1 [[id number]] Collected:  03/04/16 1715    Specimen:  Blood from Venipuncture Updated:  03/04/16 2250    Narrative:       Specimen/Source: Blood/Venipuncture  Collected: 03/04/2016 17:15     Status: Valued      Last Updated: 03/04/2016 22:48           (1) *Specimen Blood      *Source Detail Venipuncture             Culture Result (Prelim)      Culture In Progress          Wound Culture and Smear [[id number]] Collected:  03/04/16 1748    Specimen:  Wound from Left Foot Updated:  03/04/16 2239    Narrative:       Specimen/Source: Wound/Left Foot  Collected: 03/04/2016 17:48     Status: Valued      Last Updated: 03/04/2016 22:39           (1) *Specimen Wound      *Source Detail Left Foot             Gram Stain (Final)      Many WBC's Seen      Many Gram Positive Cocci          Basic Metabolic Panel [[id number]]  (Abnormal) Collected:  03/04/16 2144    Specimen:  Plasma Updated:   03/04/16 2223     Sodium 132 (L) mMol/L      Potassium 4.1 mMol/L      Chloride 103 mMol/L      CO2 23.5 mMol/L      Calcium 7.8 (L) mg/dL      Glucose 511 (H) mg/dL      Creatinine 1.13 mg/dL      BUN 27 (H) mg/dL      Anion Gap 9.6 mMol/L      BUN/Creatinine Ratio 23.9 Ratio      EGFR 74 mL/min/1.55m2      Osmolality Calc 293 mOsm/kg     Dextrose Stick Glucose [[id number]]  (Abnormal) Collected:  03/04/16 2123    Specimen:  Blood Updated:  03/04/16 2141     Glucose, POCT 487 (H) mg/dL     Influenza A / B Rapid Test [[id number]] Collected:  03/04/16 1715    Specimen:  Nasal Wash Updated:  03/04/16 1741  Influenza A Negative     Influenza B Negative    Narrative:       Influenza A antigen detection tests are unable to distinquish between novel and seasonal influenza A.    A negative result for either Influenza A or B antigen does not exclude influenza virus infection. Clinical correlation required.    All positive influenza antigen tests (A or B) require placement of patient on droplet precaution isolation.    Basic Metabolic Panel [[id number]]  (Abnormal) Collected:  03/04/16 1715    Specimen:  Plasma Updated:  03/04/16 1739     Sodium 134 (L) mMol/L      Potassium 3.9 mMol/L      Chloride 100 mMol/L      CO2 25.1 mMol/L      Calcium 8.8 mg/dL      Glucose 450 (H) mg/dL      Creatinine 1.32 (H) mg/dL      BUN 26 (H) mg/dL      Anion Gap 12.8 mMol/L      BUN/Creatinine Ratio 19.7 Ratio      EGFR 62 mL/min/1.49m2      Osmolality Calc 293 mOsm/kg     i-Stat Lactic AcID [[id number]] Collected:  03/04/16 1732    Specimen:  Venipuncture Updated:  03/04/16 1736     Room Number, ISTAT 44     Sample, ISTAT Venous     Site, ISTAT OTHER     i-STAT Lactic acid 1.07 mMol/L     i-Stat Chem 8 CartrIDge [[id number]]  (Abnormal) Collected:  03/04/16 1732    Specimen:  Blood Updated:  03/04/16 1735     i-STAT Sodium 135 mMol/L      i-STAT Potassium 3.8 mMol/L      i-STAT Chloride 98 mMol/L      TCO2, ISTAT 26 mMol/L      Ionized Ca,  ISTAT 4.30 (L) mg/dL      i-STAT Glucose 424 (H) mg/dL      i-STAT Creatinine 1.20 mg/dL      i-STAT BUN 26 (H) mg/dL      Anion Gap, ISTAT 15.0     EGFR 69 mL/min/1.44m2      i-STAT Hematocrit 39.0 %      i-STAT Hemoglobin 13.3 gm/dL     I-Stat Lactic Acid [[id number]] Collected:  03/04/16 1715    Specimen:  ISTAT Updated:  03/04/16 1730     I-STAT Notification Istat Notification    I-Stat Chem 8 [[id number]] Collected:  03/04/16 1715    Specimen:  ISTAT Updated:  03/04/16 1730     I-STAT Notification Istat Notification    CBC [[id number]]  (Abnormal) Collected:  03/04/16 1715    Specimen:  Blood from Blood Updated:  03/04/16 1727     WBC 10.8 K/cmm      RBC 4.35 M/cmm      Hemoglobin 13.0 gm/dL      Hematocrit 38.9 (L) %      MCV 89 fL      MCH 30 pg      MCHC 34 gm/dL      RDW 12.4 %      PLT CT 198 K/cmm      MPV 8.8 fL      NEUTROPHIL % 82.9 (H) %      Lymphocytes 9.9 (L) %      Monocytes 5.9 %      Eosinophils % 1.2 %      Basophils % 0.0 %  Neutrophils Absolute 8.9 (H) K/cmm      Lymphocytes Absolute 1.1 K/cmm      Monocytes Absolute 0.6 K/cmm      Eosinophils Absolute 0.1 K/cmm      BASO Absolute 0.0 K/cmm           Imaging, reviewed and are significant for:  XR FOOT LEFT AP LATERAL AND OBLIQUE   Final Result   There is resorption of the bone either side of previously seen first left metatarsal osteotomy, concerning for osteomyelitis.   There is also a possible fracture or erosion through the distal neck of the fourth metatarsal bone of the left foot.   There is medial subluxation at the second metatarsophalangeal joint of the left foot, possibly neuropathic.   There is diffuse left foot soft tissue swelling.      ReadingStation:[id number]      MRI Foot Left W Contrast    (Results Pending)         Microbiology, reviewed and are significant for:  Microbiology Results     Procedure Component Value Units Date/Time    Blood Culture - Venipuncture # 1 [[id number]] Collected:  03/04/16 1715    Specimen:  Blood from  Venipuncture Updated:  03/04/16 2250    Narrative:       Specimen/Source: Blood/Venipuncture  Collected: 03/04/2016 17:15     Status: Valued      Last Updated: 03/04/2016 22:48           (1) *Specimen Blood      *Source Detail Venipuncture             Culture Result (Prelim)      Culture In Progress          Influenza A / B Rapid Test [[id number]] Collected:  03/04/16 1715    Specimen:  Nasal Wash Updated:  03/04/16 1741     Influenza A Negative     Influenza B Negative     Comment: Method: Collier Bullock    The sensitivity for this method is between 90% and 95% for Influenza A and around 90% for Influenza B. The specificity for both Influenza A and B is around 96%. False positive results may occur, especially when the prevalence of Influenza activity is low. This is more likely with Influenza B due to its lower prevalence. Clinical conditions, including the prevalence of influenza activity, should be considered in the interpretation of results. If clinically indicated, results may be confirmed with PCR testing.  The above 2 analytes were performed by [facility] [phone])  [address] [address]         Narrative:       Influenza A antigen detection tests are unable to distinquish between novel and seasonal influenza A.    A negative result for either Influenza A or B antigen does not exclude influenza virus infection. Clinical correlation required.    All positive influenza antigen tests (A or B) require placement of patient on droplet precaution isolation.    Wound Culture and Smear [[id number]] Collected:  03/04/16 1748    Specimen:  Wound from Left Foot Updated:  03/04/16 2239    Narrative:       Specimen/Source: Wound/Left Foot  Collected: 03/04/2016 17:48     Status: Valued      Last Updated: 03/04/2016 22:39           (1) *Specimen Wound      *Source Detail Left Foot  Gram Stain (Final)      Many WBC's Seen      Many Gram Positive Cocci                Assessment:    diabetic foot infection with probable osteomyelitis, patient had MRI pending   uncontrolled diabetes with neuropathy   ulcer left leg      Plan:   Agree with vancomycin and Zosyn pending cultures' patient has grown Serratia from a bone culture in March 2017 as well as growing MSSA and group B strep from prior foot infections    Signed by: Hardin Negus, MD

## 2016-03-05 NOTE — Progress Notes (Signed)
Patient arrived to SD. VSS. In no distress. Report received prior to patient arrival. Assessment to follow shortly.

## 2016-03-05 NOTE — Progress Notes (Signed)
MD notified of blood sugar recheck of 400, MD also made aware of elevated HR and temperature. MD ordered tylenol for temp, and will transfer to stepdown for insulin drip.

## 2016-03-05 NOTE — Progress Notes (Signed)
MD called this RN to review plan of care with insulin gtt and BG. New orders placed to D/C insulin gtt at this time and to review further this AM. This RN rechecked BG at this time. BG was 111. Patient is resting comfortably, having no nausea at this time, VSS. Will continue to monitor patient closely.

## 2016-03-05 NOTE — Progress Notes (Signed)
Pharmacy Vancomycin Dosing Consult Note  [name]    Assessment:   1. Vancomycin day #2 (also on pip/tazo) in a 62 YOM who presents with diabetic ulcer on his foot.  Patient stopped taking his insulin.  Blood sugars uncontrolled and patient denies trauma. Over the past 3 weeks patient noticed worsening erythema of the left left now extending to his knees. Podiatry and ID consulted. X-ray of foot concerning for osteomyelitis and MRI pending. WBC slight elevated, febrile (Tmax = 38.3 C), SCr and BUN improved. Wound cx on 1/19 gram stain GPC.      Plan:   1. Continue vancomycin 1000 mg IV every 12 hours  2. Trough ordered for 1/21 at 1900 prior to the 5th dose  3. Pharmacy will follow the patient's renal function, vancomycin levels, and dosing during the course of therapy. If you have any questions, please contact the pharmacist at [phone].     Indication: Osteomyelitis left foot    Age: 51 y.o.  Height: 1.676 m (5\' 6" )  Weight:  109 kg (240 lb 4.8 oz)  IBW: 66.1 kg  DW: 80 kg    CrCL: 102 ml/min    Other Nephrotoxic Drugs: pip/tazo    Drug Levels:   Trough level ordered for 1/21 at 1900     Pharmacokinetics:   t50: 10.5 hrs         Kel: 0.06585 hr-1        Vd: 56 L         Expected Trough: 14.8 mg/L                Recent Labs  Lab 03/05/16  0345 03/04/16  2144 03/04/16  1732 03/04/16  1715   Creatinine 0.96 1.13  --  1.32*   i-STAT Creatinine  --   --  1.20  --    BUN 21 27*  --  26*       Recent Labs  Lab 03/05/16  0345 03/04/16  1715   WBC 11.5* 10.8     Temp (24hrs), Avg:99.4 F (37.4 C), Min:97.8 F (36.6 C), Max:101 F (38.3 C)      Cultures:   Date Source Organism Sensitivities Resistance

## 2016-03-06 LAB — CREATININE, SERUM
Creatinine: 0.8 mg/dL (ref 0.80–1.30)
EGFR: 103 mL/min/{1.73_m2} (ref 60–150)

## 2016-03-06 LAB — VH DEXTROSE STICK GLUCOSE
Glucose POCT: 177 mg/dL — ABNORMAL HIGH (ref 70–99)
Glucose POCT: 184 mg/dL — ABNORMAL HIGH (ref 70–99)
Glucose POCT: 214 mg/dL — ABNORMAL HIGH (ref 70–99)
Glucose POCT: 193 mg/dL — ABNORMAL HIGH (ref 70–99)
Glucose POCT: 249 mg/dL — ABNORMAL HIGH (ref 70–99)

## 2016-03-06 LAB — VANCOMYCIN, TROUGH: Vancomycin Trough: 9.26 ug/mL — ABNORMAL LOW (ref 10.00–20.00)

## 2016-03-06 MED ORDER — VANCOMYCIN HCL IN DEXTROSE 1-5 GM/200ML-% IV SOLN
1000.0000 mg | Freq: Once | INTRAVENOUS | Status: AC
Start: 2016-03-06 — End: 2016-03-07
  Administered 2016-03-06: 1000 mg via INTRAVENOUS

## 2016-03-06 MED ORDER — VH POTASSIUM CHLORIDE CRYS ER 20 MEQ PO TBCR (WRAP)
40.0000 meq | EXTENDED_RELEASE_TABLET | Freq: Once | ORAL | Status: AC
Start: 2016-03-06 — End: 2016-03-06
  Administered 2016-03-06: 40 meq via ORAL
  Filled 2016-03-06: qty 2

## 2016-03-06 MED ORDER — VANCOMYCIN HCL IN DEXTROSE 1-5 GM/200ML-% IV SOLN
1000.0000 mg | Freq: Three times a day (TID) | INTRAVENOUS | Status: DC
Start: 2016-03-07 — End: 2016-03-06

## 2016-03-06 MED ORDER — VANCOMYCIN HCL IN DEXTROSE 1-5 GM/200ML-% IV SOLN
1000.0000 mg | Freq: Three times a day (TID) | INTRAVENOUS | Status: DC
Start: 2016-03-07 — End: 2016-03-09
  Administered 2016-03-07 – 2016-03-09 (×5): 1000 mg via INTRAVENOUS
  Filled 2016-03-06 (×5): qty 1000

## 2016-03-06 NOTE — Progress Notes (Signed)
Assumed care of pt, assessment complete, pt denies any needs at this time.    Dressings intact, IV intact.    Reviewed plan of care for the day:   IV ABX   Pain control, pt will use white board to keep track of medication times.   Temp: 99 F (37.2 C), Heart Rate: 88, Resp Rate: 16, BP: 142/85       Will continue to monitor on rounds.

## 2016-03-06 NOTE — Progress Notes (Signed)
Patient: [name]  Date: 03/06/2016   LOS: 2 Days  Admission Date: 03/04/2016   MRN: [id number]  Attending: Jerrell Mylar, MD     SUBJECTIVE     Patient seen and examined Blood sugar remains well-controlled still complains of pain in his leg      MEDICATIONS     Current Facility-Administered Medications   Medication Dose Route Frequency   . aspirin  81 mg Oral Daily   . atorvastatin  10 mg Oral QHS   . famotidine  20 mg Oral Daily   . gabapentin  300 mg Oral TID   . heparin (porcine)  5,000 Units Subcutaneous Q8H   . insulin aspart  7 Units Subcutaneous TID AC   . insulin glargine  20 Units Subcutaneous QAM   . insulin glargine  40 Units Subcutaneous QHS   . lactobacillus species  50 Billion CFU Oral Daily   . piperacillin-tazobactam  3.375 g Intravenous Q6H   . sodium chloride  1,000 mL Intravenous Once in ED   . vancomycin  1,000 mg Intravenous Q12H   . vancomycin therapy placeholder   Does not apply See Admin Instructions     . sodium chloride 150 mL/hr at 03/05/16 1352           PHYSICAL EXAM     Objective:  I/O last 3 completed shifts:  In: 5055 [P.O.:1050; I.V.:4005]  Out: 950 [Urine:950]     Vitals:    03/05/16 1053 03/05/16 1626 03/05/16 2300 03/06/16 0700   BP: 150/76 133/69 133/77 142/85   Pulse: 94 95 91 88   Resp: 18 19 18 16    Temp: 98 F (36.7 C) 99.1 F (37.3 C) 98.4 F (36.9 C) 99 F (37.2 C)   TempSrc: Oral Oral Oral Oral   SpO2: 99% 99% 98% 100%   Weight:       Height:         Wt Readings from Last 4 Encounters:   03/05/16 109 kg (240 lb 4.8 oz)   01/21/16 113.4 kg (250 lb)   07/09/15 112 kg (247 lb)   05/04/15 104.3 kg (230 lb)          Exam: Lying comfortably in his bed  HEENT: PERRLA, EOMI  Neck: supple, without JVD  Chest: Fair air entry bilaterally  CVS: S1, S2 regular rate and rhythm  no murmurs    Abdomen: soft and non tender with good bowel sounds.  Musculoskeletal: Left lower extremity edema with erythema and  tenderness ulcer on the plantar aspect of the foot with necrotic base pulses palpable  Skin: Warm dry no worrisome lesions  NEURO:  Cranial nerve II-12 intact no motor or sensory deficits  Psychiatric: alert, interactive, appropriate, normal affect      LABS       Recent Labs  Lab 03/05/16  0345 03/04/16  1715   WBC 11.5* 10.8   RBC 3.92* 4.35   Hemoglobin 11.7* 13.0   Hematocrit 34.9* 38.9*   MCV 89 89   PLT CT 177 198       Recent Labs  Lab 03/06/16  1207 03/05/16  0345 03/04/16  2144 03/04/16  1732 03/04/16  1715   Sodium  --  137 132*  --  134*   Potassium  --  3.1* 4.1  --  3.9   Chloride  --  106 103  --  100   CO2  --  22.1 23.5  --  25.1   BUN  --  21 27*  --  26*   Creatinine 0.80 0.96 1.13  --  1.32*   i-STAT Creatinine  --   --   --  1.20  --    Glucose  --  94 511*  --  450*   Calcium  --  7.8* 7.8*  --  8.8               Invalid input(s): OSMOL          No results found for: BNP       (!) 214 (The above 1 analytes were performed by [facility] [phone]) [address],[place] [address] )  Invalid input(s): BLOODCULTURE      RADIOLOGY     Xr Foot Left Ap Lateral And Oblique    Result Date: 03/04/2016  There is resorption of the bone either side of previously seen first left metatarsal osteotomy, concerning for osteomyelitis. There is also a possible fracture or erosion through the distal neck of the fourth metatarsal bone of the left foot. There is medial subluxation at the second metatarsophalangeal joint of the left foot, possibly neuropathic. There is diffuse left foot soft tissue swelling. ReadingStation:[id number]    Mri Foot Left W Wo Contrast    Result Date: 03/05/2016  1. Osteomyelitis of the first metatarsal phalangeal joint, with associated phlegmon that extends into the volar aspect of the first metatarsal head. Clinical correlation is requested for a single linear focus of susceptibility artifact identified within the mid to distal first metatarsal shaft. 2.  Osteomyelitis of the second metatarsal which is also plantarly dislocated. 3. Pathologic fracture with osteomyelitis of the third metatarsal head/neck region which is also plantarly subluxed. 4. Comminuted pathologic fracture with osteomyelitis of the fourth metatarsal head. 5. Diffuse myositis. 6. Signal irregularity identified along the lateral base of the calcaneus is of uncertain clinical significance and could represent a focus of osteomyelitis. ReadingStation:[id number]        Assessment and Plan:     51 year old male with history of diabetes who has not been taking his insulin presented to the emergency department with worsening left foot pain and erythema found to have osteomyelitis with associated cellulitis. Patient started on broad-spectrum antibiotics and podiatry and infectious disease consulted.    - Sepsis secondary to first left metatarsal osteomyelitis also associated cellulitis     MRI reviewed with osteomyelitis of the first metatarsal joint associated with phlegmon there is also osteomyelitis of the second       Metatarsal pathological fracture with osteomyelitis of the third metatarsal head communicated pathological fracture with osteomyelitis of the fourth metatarsal head and diffuse myositis. Podiatry is following.     Possibility of debridement and cultures as per podiatry. Continue with empiric Zosyn and vancomycin cultures have been sent out follow culture results. Patient aggressively volume resuscitated discontinue further IV fluids    - Uncontrolled diabetes upon admission patient's blood sugars went over 500 he required a transient stepdown stay was on insulin infusion for about one hour and subsequently has been transferred out of stepdown blood sugars controlled on Lantus 40 units at bedtime and 20 units in the morning this can be simplified to a 35 twice a day regimen. Continue with scheduled pre-meal insulin   I've discussed with the nursing staff if the patient is nothing by mouth  after midnight for surgical debridement. I need to be informed and will have to cut down the insulin to avoid hypoglycemia. For now waiting on podiatry recommendations  for what.    - Acute kidney injury resolved with aggressive IV hydration, lisinopril and metformin currently on hold IV fluids discontinued    - Hypokalemia being repleted recheck ordered for a.m.    - Medication noncompliance    - Diabetic neuropathy on gabapentin    - DVT prophylaxis          - Disposition- medically not ready for discharge     Jerrell Mylar, MD  03/06/2016 1:41 PM    I can be reached at [phone]    NOTE: This record is partially electronically generated (including the use of voice recognition technology) and may contain additions and omissions unintended by user.

## 2016-03-06 NOTE — Plan of Care (Signed)
Problem: Pain  Goal: Pain at adequate level as identified by patient  Outcome: Progressing   03/04/16 2329   Goal/Interventions addressed this shift   Pain at adequate level as identified by patient Identify patient comfort function goal;Assess for risk of opioid induced respiratory depression, including snoring/sleep apnea. Alert healthcare team of risk factors identified.;Assess pain on admission, during daily assessment and/or before any "as needed" intervention(s);Reassess pain within 30-60 minutes of any procedure/intervention, per Pain Assessment, Intervention, Reassessment (AIR) Cycle;Evaluate if patient comfort function goal is met;Evaluate patient's satisfaction with pain management progress;Offer non-pharmacological pain management interventions;Include patient/patient care companion in decisions related to pain management as needed       Problem: Compromised Tissue integrity  Goal: Damaged tissue is healing and protected  Outcome: Progressing   03/05/16 0920   Goal/Interventions addressed this shift   Damaged tissue is healing and protected  Monitor/assess Braden scale every shift;Provide wound care per wound care algorithm;Reposition patient every 2 hours and as needed unless able to reposition self;Increase activity as tolerated/progressive mobility;Relieve pressure to bony prominences for patients at moderate and high risk;Use bath wipes, not soap and water, for daily bathing;Avoid shearing injuries;Keep intact skin clean and dry;Use incontinence wipes for cleaning urine, stool and caustic drainage. Foley care as needed;Monitor external devices/tubes for correct placement to prevent pressure, friction and shearing;Encourage use of lotion/moisturizer on skin;Monitor patient's hygiene practices;Utilize specialty bed;Consult/collaborate with wound care nurse;Consider placing an indwelling catheter if incontinence interferes with healing of stage 3 or 4 pressure injury     Goal: Nutritional status is  improving  Outcome: Progressing   03/06/16 1127   Goal/Interventions addressed this shift   Nutritional status is improving Allow adequate time for meals

## 2016-03-06 NOTE — Progress Notes (Signed)
Pt BP elevated, watching the football game.  DR Candiss Norse notified and he suggests a recheck in an hour.  If pt gets a headache to notify DR.       03/06/16 1624   Vital Signs   BP (!) 161/95

## 2016-03-06 NOTE — Progress Notes (Signed)
Pharmacy Vancomycin Dosing Consult Note  [name]    Assessment:   1. Vancomycin day #3 (also on pip/tazo) in a 27 YOM who presents with diabetic ulcer on his foot.  Patient stopped taking his insulin.  Blood sugars uncontrolled and patient denies trauma. Over the past 3 weeks patient noticed worsening erythema of the left left now extending to his knees. Podiatry and ID consulted. X-ray of foot concerning for osteomyelitis and MRI pending. WBC slight elevated yesterday, now afebrile, SCr and BUN improved yesterday. Wound cx on 1/19 gram stain GPC and mixed skin final.  Blood cx NGTD.  Trough today = 9.26. This is subtherapeutic for osteomyelitis.      Plan:   1. Change vancomycin 1000 mg IV every 12 hours to vancomycin 1000 mg IV every 8 hours  2. Trough due 03/09/2016 at 1200.  3. Pharmacy will follow the patient's renal function, vancomycin levels, and dosing during the course of therapy. If you have any questions, please contact the pharmacist at [phone].     Indication: Osteomyelitis left foot    Age: 51 y.o.  Height: 1.676 m (5\' 6" )  Weight:  109 kg (240 lb 4.8 oz)  IBW: 66.1 kg  DW: 80 kg    CrCL: greater than 120 ml/min    Other Nephrotoxic Drugs: pip/tazo, ASA    Drug Levels:   Trough level: 9.26 on 1/21 at [place] level: due on 03/09/2016 at 1200     New Pharmacokinetics:   t50: 6.2 hrs         Kel: 0.111 hr-1        Vd: 57.3 L         Expected Trough: 13 mg/L                Recent Labs  Lab 03/06/16  1207 03/05/16  0345 03/04/16  2144 03/04/16  1732 03/04/16  1715   Creatinine 0.80 0.96 1.13  --  1.32*   i-STAT Creatinine  --   --   --  1.20  --    BUN  --  21 27*  --  26*       Recent Labs  Lab 03/05/16  0345 03/04/16  1715   WBC 11.5* 10.8     Temp (24hrs), Avg:98.7 F (37.1 C), Min:98.4 F (36.9 C), Max:99 F (37.2 C)      Cultures:   Date Source Organism Sensitivities Resistance

## 2016-03-06 NOTE — Progress Notes (Signed)
Pt rested in room, up ad lib.  Plan for I&D tomorrow at 1400.  Wound on left foot and left lower leg dry.   Pain was controlled with oral medications.  Dressings are dry and intact.  IV intact  Pt is voiding WNL in the bathroom.   Neuro-vascular assessment is intact.  Vital  signs reviewed:    Temp: 98.6 F (37 C), Heart Rate: 96, Resp Rate: 16, BP: 159/80    Report given to accepting RN     End of shift.

## 2016-03-06 NOTE — Plan of Care (Signed)
Problem: Safety  Goal: Patient will be free from injury during hospitalization  Outcome: Progressing

## 2016-03-06 NOTE — Progress Notes (Signed)
PROGRESS NOTE    Date Time: 03/06/16 2:45 PM  Patient Name: [name],[name]      Subjective:   F/u left foot. C/o pain in his leg.  MRI completePatient relates no other concerns.  Medications:     Current Facility-Administered Medications   Medication Dose Route Frequency   . aspirin  81 mg Oral Daily   . atorvastatin  10 mg Oral QHS   . famotidine  20 mg Oral Daily   . gabapentin  300 mg Oral TID   . heparin (porcine)  5,000 Units Subcutaneous Q8H   . insulin aspart  7 Units Subcutaneous TID AC   . insulin glargine  20 Units Subcutaneous QAM   . insulin glargine  40 Units Subcutaneous QHS   . lactobacillus species  50 Billion CFU Oral Daily   . piperacillin-tazobactam  3.375 g Intravenous Q6H   . potassium chloride  40 mEq Oral Once   . sodium chloride  1,000 mL Intravenous Once in ED   . vancomycin  1,000 mg Intravenous Q12H   . vancomycin therapy placeholder   Does not apply See Admin Instructions         Physical Exam:     Vitals:    03/06/16 0700   BP: 142/85   Pulse: 88   Resp: 16   Temp: 99 F (37.2 C)   SpO2: 100%       Intake and Output Summary (Last 24 hours) at Date Time    Intake/Output Summary (Last 24 hours) at 03/06/16 1445  Last data filed at 03/06/16 1300   Gross per 24 hour   Intake             2466 ml   Output                0 ml   Net             2466 ml       General appearance - well appearing, and in no distress  Mental status - alert, oriented to person, place, and time, normal mood, behavior, speech, dress, motor activity, and thought processes  Neurological - alert, oriented, normal speech, no focal findings or movement disorder noted  Extremities-  On the left, there is still yellow drainage from the plantar wound.  There is persistent erythema.      MRI confirms osteomyelitis of the 2nd metatarsal head.  No distinct abscess noted.      Labs:     Results     Procedure Component Value Units Date/Time    Creatinine, Serum [[id number]] Collected:  03/06/16 1207    Specimen:  Plasma Updated:   03/06/16 1306     Creatinine 0.80 mg/dL      EGFR 103 mL/min/1.33m2     Dextrose Stick Glucose [[id number]]  (Abnormal) Collected:  03/06/16 1124    Specimen:  Blood Updated:  03/06/16 1141     Glucose, POCT 214 (H) mg/dL     Blood Culture - Venipuncture # 1 [[id number]] Collected:  03/04/16 1715    Specimen:  Blood from Venipuncture Updated:  03/06/16 0754    Narrative:       Specimen/Source: Blood/Venipuncture  Collected: 03/04/2016 17:15     Status: Valued      Last Updated: 03/06/2016 07:53           (1) *Specimen Blood      *Source Detail Venipuncture  Culture Result (Prelim)      No Growth To Date          Dextrose Stick Glucose [[id number]]  (Abnormal) Collected:  03/06/16 0703    Specimen:  Blood Updated:  03/06/16 0720     Glucose, POCT 184 (H) mg/dL     Wound Culture and Smear [[id number]] Collected:  03/04/16 1748    Specimen:  Wound from Left Foot Updated:  03/06/16 0701    Narrative:       Specimen/Source: Wound/Left Foot  Collected: 03/04/2016 17:48     Status: Final      Last Updated: 03/06/2016 06:27           (1) *Specimen Wound      *Source Detail Left Foot             Gram Stain (Final)      Many WBC's Seen      Many Gram Positive Cocci       ISO #1 (Final)      Moderate Growth      Mixed skin microbiota          Dextrose Stick Glucose [[id number]]  (Abnormal) Collected:  03/06/16 0329    Specimen:  Blood Updated:  03/06/16 0345     Glucose, POCT 177 (H) mg/dL     Dextrose Stick Glucose [[id number]]  (Abnormal) Collected:  03/05/16 2104    Specimen:  Blood Updated:  03/05/16 2120     Glucose, POCT 196 (H) mg/dL     Dextrose Stick Glucose [[id number]]  (Abnormal) Collected:  03/05/16 1646    Specimen:  Blood Updated:  03/05/16 1712     Glucose, POCT 184 (H) mg/dL           Recent BMP   Recent Labs      03/06/16   1207   Creatinine  0.80     Recent CBC WITH DIFF No results for input(s): RBC, HGB, HCT, MCV, MCHC, RDW, MPV, LABPLAT in the last 24 hours.    Invalid input(s): WHITEBLOODCE, ADIFF,  REFLX, CANCL, BAND, ABAND    Lab Results   Component Value Date    WBC 11.5 (H) 03/05/2016    HGB 11.7 (L) 03/05/2016    PLT 177 03/05/2016    NA 137 03/05/2016    K 3.1 (L) 03/05/2016    BUN 21 03/05/2016    CREAT 0.80 03/06/2016    MG 1.6 03/31/2015    AST 14 07/03/2015    ALB 2.9 (L) 07/03/2015    INR 0.9 04/30/2015    LDL 91 10/28/2014       Rads:   Radiological Procedure reviewed.    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  Cellulitis left foot.  Uncontrolled DM-2 with neuropathy.  Non comp[iance with management of diabetes.    Plan:   Patient was seen, examined and informed of findings and treatment plan.  To OR tomorrow for I&D and excisional wound debridement with excision of 2nd metatarsal head.    IV abx per Dr. Luberta Robertson.  He will likely need 6 weeks IV abx.        Signed by: Janora Norlander, DPM

## 2016-03-06 NOTE — Progress Notes (Signed)
Pharmacy Vancomycin Dosing Consult Note  [facility]    Assessment:   1. Vancomycin day #3 (also on pip/tazo) in a 51 YOM who presents with diabetic ulcer on his foot.  Patient stopped taking his insulin.  Blood sugars uncontrolled and patient denies trauma. Over the past 3 weeks patient noticed worsening erythema of the left left now extending to his knees. Podiatry and ID consulted. X-ray of foot concerning for osteomyelitis and MRI pending. WBC slight elevated yesterday, now afebrile, SCr and BUN improved yesterday. Wound cx on 1/19 gram stain GPC and mixed skin final.  Blood cx NGTD.      Plan:   1. Continue vancomycin 1000 mg IV every 12 hours  2. Will order SCr for now because trough due tonight  3. Pending trough ordered for 1/21 at 1900 prior to the 5th dose  4. Pharmacy will follow the patient's renal function, vancomycin levels, and dosing during the course of therapy. If you have any questions, please contact the pharmacist at [phone].     Indication: Osteomyelitis left foot    Age: 51 y.o.  Height: 1.676 m (5\' 6" )  Weight:  109 kg (240 lb 4.8 oz)  IBW: 66.1 kg  DW: 80 kg    CrCL: 102 ml/min    Other Nephrotoxic Drugs: pip/tazo, ASA    Drug Levels:   Trough level ordered for 1/21 at 1900     Pharmacokinetics:   t50: 10.5 hrs         Kel: 0.06585 hr-1        Vd: 56 L         Expected Trough: 14.8 mg/L                Recent Labs  Lab 03/05/16  0345 03/04/16  2144 03/04/16  1732 03/04/16  1715   Creatinine 0.96 1.13  --  1.32*   i-STAT Creatinine  --   --  1.20  --    BUN 21 27*  --  26*       Recent Labs  Lab 03/05/16  0345 03/04/16  1715   WBC 11.5* 10.8     Temp (24hrs), Avg:98.6 F (37 C), Min:98 F (36.7 C), Max:99.1 F (37.3 C)      Cultures:   Date Source Organism Sensitivities Resistance

## 2016-03-07 ENCOUNTER — Inpatient Hospital Stay: Payer: BC Managed Care – PPO | Admitting: Anesthesiology

## 2016-03-07 ENCOUNTER — Inpatient Hospital Stay: Payer: BC Managed Care – PPO

## 2016-03-07 ENCOUNTER — Encounter
Admission: EM | Disposition: A | Discharge status: Home Health | Payer: Self-pay | Source: Home / Self Care | Attending: Internal Medicine

## 2016-03-07 HISTORY — PX: DEBRIDEMENT & IRRIGATION, LOWER EXTREMITY: SHX3682

## 2016-03-07 LAB — BASIC METABOLIC PANEL
Anion Gap: 10.7 mMol/L (ref 7.0–18.0)
BUN / Creatinine Ratio: 10.8 Ratio (ref 10.0–30.0)
BUN: 9 mg/dL (ref 7–22)
CO2: 23 mMol/L (ref 20.0–30.0)
Calcium: 8.1 mg/dL — ABNORMAL LOW (ref 8.5–10.5)
Chloride: 107 mMol/L (ref 98–110)
Creatinine: 0.83 mg/dL (ref 0.80–1.30)
EGFR: 101 mL/min/{1.73_m2} (ref 60–150)
Glucose: 219 mg/dL — ABNORMAL HIGH (ref 70–99)
Osmolality Calc: 279 mOsm/kg (ref 275–300)
Potassium: 3.7 mMol/L (ref 3.5–5.3)
Sodium: 137 mMol/L (ref 136–147)

## 2016-03-07 LAB — VH DEXTROSE STICK GLUCOSE
Glucose POCT: 286 mg/dL — ABNORMAL HIGH (ref 70–99)
Glucose POCT: 212 mg/dL — ABNORMAL HIGH (ref 70–99)
Glucose POCT: 194 mg/dL — ABNORMAL HIGH (ref 70–99)
Glucose POCT: 137 mg/dL — ABNORMAL HIGH (ref 70–99)
Glucose POCT: 135 mg/dL — ABNORMAL HIGH (ref 70–99)

## 2016-03-07 LAB — CBC AND DIFFERENTIAL
Basophils %: 1 % (ref 0.0–3.0)
Basophils Absolute: 0.1 10*3/uL (ref 0.0–0.3)
Eosinophils %: 2.8 % (ref 0.0–7.0)
Eosinophils Absolute: 0.2 10*3/uL (ref 0.0–0.8)
Hematocrit: 34.2 % — ABNORMAL LOW (ref 39.0–52.5)
Hemoglobin: 11.2 gm/dL — ABNORMAL LOW (ref 13.0–17.5)
Lymphocytes Absolute: 1.7 10*3/uL (ref 0.6–5.1)
Lymphocytes: 24.4 % (ref 15.0–46.0)
MCH: 30 pg (ref 28–35)
MCHC: 33 gm/dL (ref 32–36)
MCV: 90 fL (ref 80–100)
MPV: 8.7 fL (ref 6.0–10.0)
Monocytes Absolute: 0.6 10*3/uL (ref 0.1–1.7)
Monocytes: 8.4 % (ref 3.0–15.0)
Neutrophils %: 63.5 % (ref 42.0–78.0)
Neutrophils Absolute: 4.4 10*3/uL (ref 1.7–8.6)
PLT CT: 199 10*3/uL (ref 130–440)
RBC: 3.79 10*6/uL — ABNORMAL LOW (ref 4.00–5.70)
RDW: 12.1 % (ref 11.0–14.0)
WBC: 7 10*3/uL (ref 4.0–11.0)

## 2016-03-07 SURGERY — DEBRIDEMENT AND IRRIGATION, LOWER EXTREMITY
Anesthesia: Anesthesia MAC / Sedation | Site: Foot | Laterality: Left | Wound class: Clean Contaminated

## 2016-03-07 MED ORDER — OXYCODONE HCL 5 MG PO TABS
5.0000 mg | ORAL_TABLET | Freq: Once | ORAL | Status: DC | PRN
Start: 2016-03-07 — End: 2016-03-07

## 2016-03-07 MED ORDER — MORPHINE SULFATE 2 MG/ML IJ/IV SOLN (WRAP)
2.0000 mg | Status: DC | PRN
Start: 2016-03-07 — End: 2016-03-07

## 2016-03-07 MED ORDER — FENTANYL CITRATE (PF) 50 MCG/ML IJ SOLN (WRAP)
INTRAMUSCULAR | Status: DC | PRN
Start: 2016-03-07 — End: 2016-03-07
  Administered 2016-03-07 (×2): 50 ug via INTRAVENOUS

## 2016-03-07 MED ORDER — HYDROCODONE-ACETAMINOPHEN 5-325 MG PO TABS
1.0000 | ORAL_TABLET | ORAL | Status: DC | PRN
Start: 2016-03-07 — End: 2016-03-10
  Administered 2016-03-07 – 2016-03-10 (×15): 1 via ORAL
  Filled 2016-03-07 (×15): qty 1

## 2016-03-07 MED ORDER — MIDAZOLAM HCL 2 MG/2ML IJ SOLN
INTRAMUSCULAR | Status: AC
Start: 2016-03-07 — End: ?
  Filled 2016-03-07: qty 2

## 2016-03-07 MED ORDER — LACTATED RINGERS IV SOLN
INTRAVENOUS | Status: DC | PRN
Start: 2016-03-07 — End: 2016-03-07

## 2016-03-07 MED ORDER — PROPOFOL 10 MG/ML IV EMUL (WRAP)
INTRAVENOUS | Status: DC | PRN
Start: 2016-03-07 — End: 2016-03-07
  Administered 2016-03-07: 60 mg via INTRAVENOUS

## 2016-03-07 MED ORDER — FENTANYL CITRATE (PF) 50 MCG/ML IJ SOLN (WRAP)
25.0000 ug | INTRAMUSCULAR | Status: DC | PRN
Start: 2016-03-07 — End: 2016-03-07
  Administered 2016-03-07: 25 ug via INTRAVENOUS
  Filled 2016-03-07: qty 2

## 2016-03-07 MED ORDER — PROPOFOL 200 MG/20ML IV EMUL
INTRAVENOUS | Status: AC
Start: 2016-03-07 — End: ?
  Filled 2016-03-07: qty 60

## 2016-03-07 MED ORDER — ONDANSETRON HCL 4 MG/2ML IJ SOLN
4.0000 mg | Freq: Once | INTRAMUSCULAR | Status: DC | PRN
Start: 2016-03-07 — End: 2016-03-07

## 2016-03-07 MED ORDER — BUPIVACAINE HCL (PF) 0.5 % IJ SOLN
INTRAMUSCULAR | Status: DC | PRN
Start: 2016-03-07 — End: 2016-03-07
  Administered 2016-03-07: 8 mL

## 2016-03-07 MED ORDER — FENTANYL CITRATE (PF) 50 MCG/ML IJ SOLN (WRAP)
INTRAMUSCULAR | Status: AC
Start: 2016-03-07 — End: ?
  Filled 2016-03-07: qty 2

## 2016-03-07 MED ORDER — MIDAZOLAM HCL 2 MG/2ML IJ SOLN
INTRAMUSCULAR | Status: DC | PRN
Start: 2016-03-07 — End: 2016-03-07
  Administered 2016-03-07: 2 mg via INTRAVENOUS

## 2016-03-07 MED ORDER — PROPOFOL INFUSION 10 MG/ML
INTRAVENOUS | Status: DC | PRN
Start: 2016-03-07 — End: 2016-03-07
  Administered 2016-03-07: 140 ug/kg/min via INTRAVENOUS

## 2016-03-07 MED ORDER — SODIUM CHLORIDE 0.9 % IV SOLN
INTRAVENOUS | Status: DC
Start: 2016-03-07 — End: 2016-03-08

## 2016-03-07 SURGICAL SUPPLY — 28 items
BANDAGE ESMARK 4IN  STERILE LF (Dressings) IMPLANT
BANDAGE KLING 3 STERILE (Dressings) IMPLANT
BANDAGE SOF BAND KERLIX 4.5 IN (Dressings) ×2 IMPLANT
BLADE SAG FINE 25X9.5MM (Supply) ×2 IMPLANT
BLADE SCALPEL #15 (Supply) ×6 IMPLANT
CONTAINER STER SPEC PEEL/PACK (Supply) ×2 IMPLANT
CUFF TOURNIQUET 24 (Supply) IMPLANT
CULTURETTE BBL (Supply) IMPLANT
DRSG COMBINE 8 X 7.5 (Dressings) IMPLANT
GAUZE IODOFORM 1 (Dressings) ×2 IMPLANT
GAUZE PLAIN PACKING 1 IN X 5Y (Dressings) IMPLANT
GLOVE BIOGEL UT PI SURG SZ 7 (Supply) ×2 IMPLANT
GOWN STD LG W/TOWEL REUSE (Supply) ×6 IMPLANT
IRRG INTERPULSE W/SX FAN SPRAY (Supply) IMPLANT
NDL HYPO 25GA X 1.5 (Supply) ×2 IMPLANT
PACK EXTREMITY-LF (Supply) ×2 IMPLANT
PAD ABD STERILE 8 X 10 (Dressings) ×4 IMPLANT
SCRUB BETADINE 4OZ (Supply) ×2 IMPLANT
SLEEVE ARM DISP SINGLE-USE (Supply) IMPLANT
SOL SALINE IRRIG 3000ML (Supply) ×2 IMPLANT
SOL SALINE IRRIG 500ML (Solutions) ×2 IMPLANT
SOL WATER STERILE IRRG 500ML (Solutions) IMPLANT
SOLUTION POVIDONE IODINE 4 OZ (Supply) ×2 IMPLANT
SUT MONO PLUS 4-0 PS2  MCP426H (Supply) ×2 IMPLANT
SYRINGE 12CC CONTROL LL (Supply) ×2 IMPLANT
TOWEL (FAN-FOLD) 6/PK (Supply) ×4 IMPLANT
TRAY SKIN PREP  DRY (Supply) ×2 IMPLANT
UNDERPAD BLUE 23X36  LF (Supply) ×4 IMPLANT

## 2016-03-07 NOTE — Progress Notes (Signed)
Pt back to room from PACU. VSS. Dressing to left foot is clean,dry and intact. Pt rates pain at a 0-2 at this time. IV fluids infusing per order. Orders released. Pt's blood sugar = 123. Secretary to order surgical shoe.

## 2016-03-07 NOTE — Progress Notes (Signed)
Called Dr. Donivan Scull regarding insulin. Per MD, give scheduled lantus and hold scheduled novolog and sliding scale novolog.

## 2016-03-07 NOTE — Plan of Care (Signed)
Problem: Safety  Goal: Patient will be free from injury during hospitalization  Outcome: Progressing

## 2016-03-07 NOTE — Plan of Care (Signed)
Problem: Safety  Goal: Patient will be free from injury during hospitalization  Outcome: Progressing   03/07/16 1150   Goal/Interventions addressed this shift   Patient will be free from injury during hospitalization  Assess patient's risk for falls and implement fall prevention plan of care per policy;Provide and maintain safe environment;Include patient/ family/ care giver in decisions related to safety;Assess for patients risk for elopement and implement Elopement Risk Plan per policy     Goal: Patient will be free from infection during hospitalization  Outcome: Progressing   03/07/16 1150   Goal/Interventions addressed this shift   Free from Infection during hospitalization Assess and monitor for signs and symptoms of infection;Monitor lab/diagnostic results       Problem: Moderate/High Fall Risk Score >5  Goal: Patient will remain free of falls  Outcome: Progressing  Pt using call bell appropriately, not impulsive at this time.

## 2016-03-07 NOTE — UM Notes (Signed)
[name] Utilization Management Review Sheet    Facility :  [facility]    NAME: [name]  MR#: [id number]    CSN#: [id number]    ROOM: 422/422-A AGE: 51 y.o.    ADMIT DATE AND TIME: 03/04/2016  4:41 PM      PATIENT CLASS: Inpatient     ATTENDING PHYSICIAN: [name], *  PAYOR: [employer]      AUTH #: [id number]    DIAGNOSIS:     ICD-10-CM    1. Diabetic foot infection E11.69     L08.9        HISTORY:   Past Medical History:   Diagnosis Date   . Complication of anesthesia     last time he was kicking his legs had to wake him up   . Diabetes mellitus    . Diabetic ulcer of left foot 12/26/14   . Disc    . Gastroesophageal reflux disease    . Hyperlipidemia    . Hypertension    . Seasonal allergic rhinitis    . Sleep apnea     not tested   . Type 2 diabetes mellitus, controlled        DATE OF REVIEW: 03/07/2016    VITALS: BP 142/68   Pulse 60   Temp 97.9 F (36.6 C)   Resp 17   Ht 1.676 m (5\' 6" )   Wt 109 kg (240 lb 4.8 oz)   SpO2 99%   BMI 38.79 kg/m     Active Hospital Problems    Diagnosis   . Osteomyelitis     Admitted via the ED "[name] is a 51 y.o. male with history of diabetes who's not taking his insulin since he ran out of his insurance and cannot afford the insulin presents to the emergency department with worsening left foot infection. Patient denied any inciting trauma. He does have diabetes and his blood sugars have been uncontrolled. Over the past 3 weeks he says worsening erythema of the left leg which is now started to extend proximally to started in the feet but now extends  All the way up to the knees.  The redness and pain has progressively gotten worse over the past 3 weeks. Patient has not seen a primary care doctor or a podiatrist for this he's not been on antibiotic therapy.  Patient does admit to high-grade subjective  Fevers and chills at home"    Labs: sodium 134, glucose 511, BUN 26, creatinine 1.32, calcium 7.8, WBC 11.5, H&H 11.7/34.9, potassium  3.1,     Left wound culture and smear growing many Gram positive cocci     Imaging: Left foot xray: "There is resorption of the bone either side of previously seen first left metatarsal osteotomy, concerning for osteomyelitis.  There is also a possible fracture or erosion through the distal neck of the fourth metatarsal bone of the left foot.  There is medial subluxation at the second metatarsophalangeal joint of the left foot, possibly neuropathic.  There is diffuse left foot soft tissue swelling."    MRI left foot: "1. Osteomyelitis of the first metatarsal phalangeal joint, with associated phlegmon that extends into the volar aspect of the first metatarsal head. Clinical correlation is requested for a single linear focus of susceptibility artifact identified within the mid to distal first metatarsal shaft.  2. Osteomyelitis of the second metatarsal which is also plantarly dislocated.  3. Pathologic fracture with osteomyelitis of the third metatarsal head/neck region which  is also plantarly subluxed.  4. Comminuted pathologic fracture with osteomyelitis of the fourth metatarsal head.  5. Diffuse myositis.  6. Signal irregularity identified along the lateral base of the calcaneus is of uncertain clinical significance and could represent a focus of osteomyelitis."    Febrile up to 101, tachycardic up to 123    Meds: neurontin tid, heparin sc q8h, zosyn ivpb q6h,  IV fluid bolus x2 liters in ED, vancomycin ivpb q8h, IV fluids, insulin gtt (d/c 1/20), flexeril x1, insulin per ss    Plans: admitted to stepdown unit and transferred to medical floor 1/20, PICC line to be placed, infectious diseases consult, podiatry consult, NPO for surgery today on his left foot, pain management, dvt prophylaxis, glucose monitoring and ss insulin coverage    Stephen  [name]  Utilization Review  P [phone]  F [phone]

## 2016-03-07 NOTE — Brief Op Note (Signed)
BRIEF POST-OP NOTE    Date Time: 3:27 PM, 03/07/2016      Patient Name:   [name]    Date of Operation:   03/07/2016    Providers Performing:   Surgeon(s):  Janora Norlander, DPM    Assistants:   Pilar Grammes DPM  [facility] on call [phone]) for order clarifications    Diagnosis:   Abscess left foot  Osteomyelitis left 2nd metatarsal    Procedure:   Procedure(s) (LRB):  DEBRIDEMENT & IRRIGATION, LOWER EXTREMITY (Left)    Left 2nd metatarsal head resection    Anesthesia:    Monitor Anesthesia Care   Georgiann Mohs, MD   Anesthesiologist: Georgiann Mohs, MD; Marylu Lund, DO  CRNA: Chapman Moss, CRNA     Estimated Blood Loss:   Less than 5 cc    Hemostasis:   Anatomical dissection, mechanical compression, electrocautery    No tourniquet was used throughout the entire procedure      Implants:   None     Materials:   Nylon    Injectables:   PRE-operatively:    8 cc 1:1 mix of 1% lidocaine plain and 0.5% bupivicaine plain    POST-operatively:   None    Specimens:   1. Left 2nd metatarsal head for culture and pathology    Antibiotics:   Antbiotics were given as scheduled from the floor     Drains:   None    Complications:   Patient tolerated the procedure well without complication.    Findings:   Please see op-report    Condition:   Patient was taken to PACU in good condition and all vital signs stable      Pilar Grammes DPM PGY-2

## 2016-03-07 NOTE — Progress Notes (Signed)
Pharmacy Vancomycin Dosing Consult Note  [name]    Assessment:   1. Vancomycin day #4 (also on pip/tazo) in a 80 YOM who presents with diabetic ulcer on his foot.  Patient stopped taking his insulin.  Blood sugars uncontrolled and patient denies trauma. Over the past 3 weeks patient noticed worsening erythema of the left left now extending to his knees. Podiatry and ID consulted. X-ray of foot concerning for osteomyelitis and MRI confirmed osteo in several places. Plan for OR today for I&D and excisional wound debridement with excision of 2nd metatarsal head.  2. WBC wnl, now afebrile, SCr and BUN wnl. Dose increased yesterday after subtherapeutic trough. New cultures to be collected in OR today.     Plan:   1. Continue vancomycin 1000 mg IV every 8 hours.  2. Trough due 03/09/2016 at 0700.  3. Pharmacy will follow the patient's renal function, vancomycin levels, and dosing during the course of therapy. If you have any questions, please contact the pharmacist at [phone].     Indication: Osteomyelitis left foot    Age: 51 y.o.  Height: 1.676 m (5\' 6" )  Weight:  109 kg (240 lb 4.8 oz)  IBW: 66.1 kg  DW: 80 kg    CrCL: > 120 ml/min    Other Nephrotoxic Drugs: pip/tazo, ASA    Drug Levels:   Trough level: 9.26 on 1/21 at [place] level: due on 03/09/2016 at 0700     New Pharmacokinetics:   t50: 6.2 hrs         Kel: 0.111 hr-1        Vd: 57.3 L         Expected Trough: 13 mg/L                Recent Labs  Lab 03/07/16  0610 03/06/16  1207 03/05/16  0345 03/04/16  2144  03/04/16  1715   Creatinine 0.83 0.80 0.96 1.13  --  1.32*   i-STAT Creatinine  --   --   --   --  More results in Results Review  --    BUN 9  --  21 27*  --  26*   More results in Results Review = values in this interval not displayed.    Recent Labs  Lab 03/07/16  0610 03/05/16  0345 03/04/16  1715   WBC 7.0 11.5* 10.8     Temp (24hrs), Avg:98.3 F (36.8 C), Min:97.9 F (36.6 C), Max:98.6 F (37 C)      Cultures:   Date Source Organism  Sensitivities Resistance   1/19 blood ngtd     1/19 Wound L foot ngtd

## 2016-03-07 NOTE — Anesthesia Postprocedure Evaluation (Signed)
Anesthesia Post Evaluation    Patient: [name]    Procedures performed: Procedure(s) with comments:  DEBRIDEMENT & IRRIGATION, LEFT FOOT 2ND TOE, EXCISION OF 2ND METATARSEL - I&D Left Foot    Anesthesia type: MAC    Patient location:Phase I PACU    Last vitals:   Vitals:    03/07/16 1600   BP: 135/79   Pulse: 85   Resp: 17   Temp: 36.2 C (97.2 F)   SpO2: 100%       Post pain: Patient not complaining of pain, continue current therapy      Mental Status:awake    Respiratory Function: tolerating room air    Cardiovascular: stable    Nausea/Vomiting: patient not complaining of nausea or vomiting    Hydration Status: adequate    Post assessment: no apparent anesthetic complications

## 2016-03-07 NOTE — Progress Notes (Addendum)
PODIATRIC SURGERY PROGRESS NOTE   Resident pager [phone]    Date Time: 03/07/16 9:41 AM  Patient Name: [name] Day: 4    Assessment:   51 y.o. 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 uncontrolled DM2 with neuropathy and non compliance with management of diabetes.    Plan:   - Dressing intact to left foot  - NPO. Plan for OR today for I&D and excisional wound debridement with excision of 2nd metatarsal head  - Continue IV antibiotics per ID. Will obtain intraoperative cultures  - Will discuss plan with attending, continue to follow.     Subjective:   Patient evaluated at bedside in NAD.     Physical Exam:     Vitals:    03/07/16 0706   BP: 142/68   Pulse: 60   Resp: 17   Temp: 97.9 F (36.6 C)   SpO2: 99%     Left Lower Extremity Exam  Dressing intact to left foot with yellow serous strikethrough. No ascending erythema appreciated. Calf non tender.    Labs:   Labs (last 72 hours):      Recent Labs  Lab 03/07/16  0610 03/05/16  0345   WBC 7.0 11.5*   Hemoglobin 11.2* 11.7*   Hematocrit 34.2* 34.9*   PLT CT 199 177            Recent Labs  Lab 03/07/16  0610 03/06/16  1207 03/05/16  0345   Sodium 137  --  137   Potassium 3.7  --  3.1*   Chloride 107  --  106   CO2 23.0  --  22.1   BUN 9  --  21   Creatinine 0.83 0.80 0.96   Calcium 8.1*  --  7.8*   Glucose 219*  --  94                 Microbiology:  Microbiology Results     Procedure Component Value Units Date/Time    Blood Culture - Venipuncture # 1 [[id number]] Collected:  03/04/16 1715    Specimen:  Blood from Venipuncture Updated:  03/06/16 0754    Narrative:       Specimen/Source: Blood/Venipuncture  Collected: 03/04/2016 17:15     Status: Valued      Last Updated: 03/06/2016 07:53           (1) *Specimen Blood      *Source Detail Venipuncture             Culture Result (Prelim)      No Growth To Date          Influenza A / B Rapid Test [[id number]] Collected:  03/04/16 1715    Specimen:  Nasal Wash Updated:   03/04/16 1741     Influenza A Negative     Influenza B Negative     Comment: Method: Collier Bullock    The sensitivity for this method is between 90% and 95% for Influenza A and around 90% for Influenza B. The specificity for both Influenza A and B is around 96%. False positive results may occur, especially when the prevalence of Influenza activity is low. This is more likely with Influenza B due to its lower prevalence. Clinical conditions, including the prevalence of influenza activity, should be considered in the interpretation of results. If clinically indicated, results may be confirmed with PCR testing.  The above 2 analytes were performed by [facility] (  [id number])  [address],[place] [address]         Narrative:       Influenza A antigen detection tests are unable to distinquish between novel and seasonal influenza A.    A negative result for either Influenza A or B antigen does not exclude influenza virus infection. Clinical correlation required.    All positive influenza antigen tests (A or B) require placement of patient on droplet precaution isolation.    Wound Culture and Smear [[id number]] Collected:  03/04/16 1748    Specimen:  Wound from Left Foot Updated:  03/06/16 0701    Narrative:       Specimen/Source: Wound/Left Foot  Collected: 03/04/2016 17:48     Status: Final      Last Updated: 03/06/2016 06:27           (1) *Specimen Wound      *Source Detail Left Foot             Gram Stain (Final)      Many WBC's Seen      Many Gram Positive Cocci       ISO #1 (Final)      Moderate Growth      Mixed skin microbiota                Radiology:   Results reviewed.  Xr Foot Left Ap Lateral And Oblique    Result Date: 03/04/2016  There is resorption of the bone either side of previously seen first left metatarsal osteotomy, concerning for osteomyelitis. There is also a possible fracture or erosion through the distal neck of the fourth metatarsal bone of the left foot. There is medial  subluxation at the second metatarsophalangeal joint of the left foot, possibly neuropathic. There is diffuse left foot soft tissue swelling. ReadingStation:[id number]    Mri Foot Left W Wo Contrast    Result Date: 03/05/2016  1. Osteomyelitis of the first metatarsal phalangeal joint, with associated phlegmon that extends into the volar aspect of the first metatarsal head. Clinical correlation is requested for a single linear focus of susceptibility artifact identified within the mid to distal first metatarsal shaft. 2. Osteomyelitis of the second metatarsal which is also plantarly dislocated. 3. Pathologic fracture with osteomyelitis of the third metatarsal head/neck region which is also plantarly subluxed. 4. Comminuted pathologic fracture with osteomyelitis of the fourth metatarsal head. 5. Diffuse myositis. 6. Signal irregularity identified along the lateral base of the calcaneus is of uncertain clinical significance and could represent a focus of osteomyelitis. ReadingStation:[id number]        Signed by: Pilar Grammes DPM PGY-2

## 2016-03-07 NOTE — Anesthesia Preprocedure Evaluation (Signed)
Anesthesia Evaluation    AIRWAY    Mallampati: II    TM distance: <3 FB  Neck ROM: full  Mouth Opening:full   CARDIOVASCULAR    regular and normal       DENTAL         PULMONARY    clear to auscultation     OTHER FINDINGS              Relevant Problems   (+) Acute renal failure   (+) Diabetes mellitus with foot ulcer   (+) HTN (hypertension)   (+) Type 2 diabetes mellitus with diabetic neuropathy   (+) Type 2 diabetes mellitus with diabetic polyneuropathy, with long-term current use of insulin       PSS Anesthesia Comments: Diabetic htn osteomyelitis  Kidney function normal today  K+ WNL        Anesthesia Plan    ASA 4     MAC                     intravenous induction   Detailed anesthesia plan: MAC        Post op pain management: per surgeon    informed consent obtained    ECG reviewed  pertinent labs reviewed             Signed by: Marylu Lund 03/07/16 1:55 PM

## 2016-03-07 NOTE — Transfer of Care (Signed)
Anesthesia Transfer of Care Note  To PACU, HOB and side rails up. VSS, Report to RN  Patient: [name]    Last vitals:   Vitals:    03/07/16 1529   BP: 107/64   Pulse: 84   Resp: 14   Temp: 36.6 C (97.9 F)   SpO2: 99%       Oxygen: Nasal Cannula     Mental Status:awake    Airway: Natural    Cardiovascular Status:  stable

## 2016-03-07 NOTE — Progress Notes (Signed)
Patient: [name]  Date: 03/07/2016   LOS: 3 Days  Admission Date: 03/04/2016   MRN: [id number]  Attending: Ernestene Kiel, MD     SUBJECTIVE     51 year old male with history of diabetes who has not been taking his insulin presented to the emergency department with worsening left foot pain and erythema found to have osteomyelitis with associated cellulitis. Patient started on broad-spectrum antibiotics and podiatry and infectious disease consulted.      still complains of pain in his leg      MEDICATIONS     Current Facility-Administered Medications   Medication Dose Route Frequency   . aspirin  81 mg Oral Daily   . atorvastatin  10 mg Oral QHS   . famotidine  20 mg Oral Daily   . gabapentin  300 mg Oral TID   . heparin (porcine)  5,000 Units Subcutaneous Q8H   . insulin aspart  7 Units Subcutaneous TID AC   . insulin glargine  20 Units Subcutaneous QAM   . insulin glargine  40 Units Subcutaneous QHS   . lactobacillus species  50 Billion CFU Oral Daily   . piperacillin-tazobactam  3.375 g Intravenous Q6H   . sodium chloride  1,000 mL Intravenous Once in ED   . vancomycin  1,000 mg Intravenous Q8H   . vancomycin therapy placeholder   Does not apply See Admin Instructions             PHYSICAL EXAM     Objective:  I/O last 3 completed shifts:  In: 2826 [P.O.:960; I.V.:1866]  Out: -      Vitals:    03/06/16 1624 03/06/16 1824 03/06/16 2300 03/07/16 0706   BP: (!) 161/95 159/80 142/80 142/68   Pulse:  96 89 60   Resp:   16 17   Temp:   98.4 F (36.9 C) 97.9 F (36.6 C)   TempSrc:   Oral    SpO2:   100% 99%   Weight:       Height:         Wt Readings from Last 4 Encounters:   03/05/16 109 kg (240 lb 4.8 oz)   01/21/16 113.4 kg (250 lb)   07/09/15 112 kg (247 lb)   05/04/15 104.3 kg (230 lb)          Exam: Lying comfortably in his bed  HEENT: PERRLA, EOMI  Neck: supple, without JVD  Chest: Fair air entry bilaterally  CVS: S1, S2 regular rate and  rhythm  no murmurs    Abdomen: soft and non tender with good bowel sounds.  Musculoskeletal: Left lower extremity edema with erythema and tenderness ulcer on the plantar aspect of the foot with necrotic base pulses palpable  Skin: Warm dry no worrisome lesions  NEURO:  Cranial nerve II-12 intact no motor or sensory deficits  Psychiatric: alert, interactive, appropriate, normal affect      LABS       Recent Labs  Lab 03/07/16  0610 03/05/16  0345 03/04/16  1715   WBC 7.0 11.5* 10.8   RBC 3.79* 3.92* 4.35   Hemoglobin 11.2* 11.7* 13.0   Hematocrit 34.2* 34.9* 38.9*   MCV 90 89 89   PLT CT 199 177 198       Recent Labs  Lab 03/07/16  0610 03/06/16  1207 03/05/16  0345 03/04/16  2144 03/04/16  1732 03/04/16  1715   Sodium 137  --  137 132*  --  134*  Potassium 3.7  --  3.1* 4.1  --  3.9   Chloride 107  --  106 103  --  100   CO2 23.0  --  22.1 23.5  --  25.1   BUN 9  --  21 27*  --  26*   Creatinine 0.83 0.80 0.96 1.13  --  1.32*   i-STAT Creatinine  --   --   --   --  1.20  --    Glucose 219*  --  94 511*  --  450*   Calcium 8.1*  --  7.8* 7.8*  --  8.8               Invalid input(s): OSMOL          No results found for: BNP       (!) 212 (The above 1 analytes were performed by [facility] [id number]) [address],[place] [address] )  Invalid input(s): BLOODCULTURE      RADIOLOGY     Xr Foot Left Ap Lateral And Oblique    Result Date: 03/04/2016  There is resorption of the bone either side of previously seen first left metatarsal osteotomy, concerning for osteomyelitis. There is also a possible fracture or erosion through the distal neck of the fourth metatarsal bone of the left foot. There is medial subluxation at the second metatarsophalangeal joint of the left foot, possibly neuropathic. There is diffuse left foot soft tissue swelling. ReadingStation:[id number]    Mri Foot Left W Wo Contrast    Result Date: 03/05/2016  1. Osteomyelitis of the first metatarsal phalangeal joint, with associated  phlegmon that extends into the volar aspect of the first metatarsal head. Clinical correlation is requested for a single linear focus of susceptibility artifact identified within the mid to distal first metatarsal shaft. 2. Osteomyelitis of the second metatarsal which is also plantarly dislocated. 3. Pathologic fracture with osteomyelitis of the third metatarsal head/neck region which is also plantarly subluxed. 4. Comminuted pathologic fracture with osteomyelitis of the fourth metatarsal head. 5. Diffuse myositis. 6. Signal irregularity identified along the lateral base of the calcaneus is of uncertain clinical significance and could represent a focus of osteomyelitis. ReadingStation:[id number]        Assessment and Plan:         - Sepsis secondary to first left metatarsal osteomyelitis also associated cellulitis     MRI showed osteomyelitis of the first metatarsal joint associated with phlegmon there is also osteomyelitis of the second Metatarsal pathological fracture with osteomyelitis of the third metatarsal head communicated pathological fracture with osteomyelitis of the fourth metatarsal head and diffuse myositis. Podiatry did surgery today.Dr.Galbraith consulted.continue with vancomycin and zosyn.      - Type II diabetes ; patient is on lantus 20 units in am and 40 units pm.he is also on novolog 7 units before meals when his blood sugar is above 150.    - Acute kidney injury resolved with aggressive IV hydration, lisinopril and metformin currently on hold IV fluids discontinued    - Diabetic neuropathy on gabapentin    - DVT prophylaxis;heparin          - Disposition- medically not ready for discharge     Ernestene Kiel, MD  03/07/2016 9:18 AM    I can be reached at [phone]    NOTE: This record is partially electronically generated (including the use of voice recognition technology) and may contain additions and omissions unintended by user.

## 2016-03-08 LAB — CBC AND DIFFERENTIAL
Basophils %: 0.5 % (ref 0.0–3.0)
Basophils Absolute: 0 10*3/uL (ref 0.0–0.3)
Eosinophils %: 2.2 % (ref 0.0–7.0)
Eosinophils Absolute: 0.2 10*3/uL (ref 0.0–0.8)
Hematocrit: 31.3 % — ABNORMAL LOW (ref 39.0–52.5)
Hemoglobin: 10.1 gm/dL — ABNORMAL LOW (ref 13.0–17.5)
Lymphocytes Absolute: 1.7 10*3/uL (ref 0.6–5.1)
Lymphocytes: 20.6 % (ref 15.0–46.0)
MCH: 29 pg (ref 28–35)
MCHC: 32 gm/dL (ref 32–36)
MCV: 90 fL (ref 80–100)
MPV: 8.6 fL (ref 6.0–10.0)
Monocytes Absolute: 0.6 10*3/uL (ref 0.1–1.7)
Monocytes: 7.9 % (ref 3.0–15.0)
Neutrophils %: 68.7 % (ref 42.0–78.0)
Neutrophils Absolute: 5.5 10*3/uL (ref 1.7–8.6)
PLT CT: 223 10*3/uL (ref 130–440)
RBC: 3.49 10*6/uL — ABNORMAL LOW (ref 4.00–5.70)
RDW: 12 % (ref 11.0–14.0)
WBC: 8 10*3/uL (ref 4.0–11.0)

## 2016-03-08 LAB — BASIC METABOLIC PANEL
Anion Gap: 10.1 mMol/L (ref 7.0–18.0)
BUN / Creatinine Ratio: 10.8 Ratio (ref 10.0–30.0)
BUN: 10 mg/dL (ref 7–22)
CO2: 24.7 mMol/L (ref 20.0–30.0)
Calcium: 8.4 mg/dL — ABNORMAL LOW (ref 8.5–10.5)
Chloride: 107 mMol/L (ref 98–110)
Creatinine: 0.93 mg/dL (ref 0.80–1.30)
EGFR: 94 mL/min/{1.73_m2} (ref 60–150)
Glucose: 131 mg/dL — ABNORMAL HIGH (ref 70–99)
Osmolality Calc: 277 mOsm/kg (ref 275–300)
Potassium: 3.8 mMol/L (ref 3.5–5.3)
Sodium: 138 mMol/L (ref 136–147)

## 2016-03-08 LAB — VH DEXTROSE STICK GLUCOSE
Glucose POCT: 123 mg/dL — ABNORMAL HIGH (ref 70–99)
Glucose POCT: 113 mg/dL — ABNORMAL HIGH (ref 70–99)
Glucose POCT: 135 mg/dL — ABNORMAL HIGH (ref 70–99)
Glucose POCT: 126 mg/dL — ABNORMAL HIGH (ref 70–99)
Glucose POCT: 204 mg/dL — ABNORMAL HIGH (ref 70–99)

## 2016-03-08 LAB — MAGNESIUM: Magnesium: 1.6 mg/dL (ref 1.6–2.6)

## 2016-03-08 MED ORDER — SODIUM CHLORIDE 0.9 % IJ SOLN
10.0000 mL | Freq: Every day | INTRAMUSCULAR | Status: DC
Start: 2016-03-08 — End: 2016-03-10
  Administered 2016-03-08 – 2016-03-10 (×3): 10 mL

## 2016-03-08 MED ORDER — SODIUM CHLORIDE 0.9 % IJ SOLN
10.0000 mL | INTRAMUSCULAR | Status: DC | PRN
Start: 2016-03-08 — End: 2016-03-10
  Administered 2016-03-09: 10 mL

## 2016-03-08 MED ORDER — VH LIDOCAINE HCL (PF) 1% INJECTION IN PICC LINE KIT ONLY
1.0000 mL | Freq: Once | INTRAMUSCULAR | Status: AC
Start: 2016-03-08 — End: 2016-03-08
  Administered 2016-03-08: 1 mL via SUBCUTANEOUS

## 2016-03-08 MED ORDER — HEPARIN SOD (PORK) LOCK FLUSH 10 UNIT/ML IV SOLN
3.0000 mL | Freq: Every day | INTRAVENOUS | Status: DC
Start: 2016-03-08 — End: 2016-03-10
  Administered 2016-03-08 – 2016-03-10 (×3): 5 mL
  Filled 2016-03-08 (×3): qty 5

## 2016-03-08 MED ORDER — VH MAGNESIUM SULFATE 2 G IN 50 ML IV PREMIX
2.0000 g | Freq: Once | INTRAVENOUS | Status: AC
Start: 2016-03-08 — End: 2016-03-08
  Administered 2016-03-08: 2 g via INTRAVENOUS
  Filled 2016-03-08: qty 50

## 2016-03-08 NOTE — Discharge Instr - AVS First Page (Addendum)
A visiting nurse  will assist with your care at home from [facility].  They should contact you before they visit. Phone # [phone]     You will need to have a weekly blood draw CBC and CMP by the home health nurses.    The nurses will assist with your dressing changes on your foot. Wet to dry dressing change: wet 4x4 gauze and apply to wound (wound is not packed at this time, however you can place the corner of the gauze in the wound if needed), apply dry 4x4 gauze, wrap with kerlix and ACE wrap.     The nurses will change the PICC line dressing. DO NOT USE the PICC Line for anything other than it's intended use.    Your IV medications and supplies will be supplied and delivered to you from [facility]. #  [id number].        -You will need to change your dressing to your left ankle daily.  Cleanse with saline, apply Santyl and a dry dressing to this wound.  - Keep your foot dressing clean and dry.   - Heel weightbearing in surgical shoe.   -Continue to take your antibiotic as ordered.  -Your PICC line is to be used only for your antibiotic dose therapy.     Discharge Instructions: Caring for Your Peripherally Inserted Central Catheter (PICC)        You are going home with a peripherally inserted central catheter (PICC). This small, soft tube has been placed in a vein in your arm. It is often used when treatment requires medicines or nutrition for weeks or months. At home, you need to take care of your PICC to keep it working.Because a PICC line has a high infection risk, you must take extra care washing your hands and preventing the spread of germs. This sheet will help you remember what to do to care for your PICC at home.    Catheter Type: Power picc 35f single lumen  Insertion Site (vein): Right Basilic  Total Length: 45 cm  Internal Length: 44 cm  External Length: 1 cm  UAC: 32 cm    PICC Kit Lot #: [id number]      Understanding your role   A nurse or other healthcare provider  will teach you and your caregivers how to care for the PICC. Before leaving the hospital, make sure you understand what to do at home, how long you may need the PICC, and when to have a follow-up visit.     You will likely be told to flush the PICC with saline and heparin solution. You may also be told to change the catheter's injection caps and change the dressing (bandage). Or, a nurse may do this for you during a follow-up visit. Only do these things if you're told to, following the instructions you were given.    Protecting the PICC  If the PICC gets damaged, it won't work right and could raise your chance of infection. Call your healthcare team right away if any damage occurs. To protect the PICC at home:   Prevent infection. Use good hand hygiene by following the guidelines on this sheet. Don't touch the catheter or dressing unless you need to. And always clean your hands before and after you come in contact with any part of the PICC. Your caregivers, family members, and any visitors should use good hand hygiene, too.     Keep the PICC dry. The  catheter and dressing must stay dry. Don't take baths, go swimming, use a hot tub, or do other things that could get the PICC wet. Take a sponge bath to avoid getting your catheter wet, unless your healthcare provider tells you otherwise. Ask your provider about the best way to keep your catheter dry when bathing or showering. If the dressing does get wet, change it only if you have been shown how. Otherwise, call your healthcare team right away for help.     Avoid damage. Don't use any sharp or pointy objects around the catheter. This includes scissors, pins, knives, razors, or anything else that could cut it or put a hole in it (puncture it). Also, don't let anything pull or rub on the catheter, such as clothing.     Watch for signs of problems. Pay attention to how much of the catheter sticks out from your skin. If this changes at all, let your healthcare  provider know. Also watch for cracks, leaks, or other damage. If the dressing becomes dirty, loose, or wet, change it (if you have been instructed to). Or call your healthcare team right away.     Avoid lowering your chest below your waist. This includes bending at the waist to do things like tying your shoes. When your chest is below your waist, especially for a long time, the catheter's internal tip could slip out of place in the vein.     Tell your healthcare team if you vomit or have severe coughing. This can also make the catheter slip out of place.    Protecting your arm  The arm with the PICC is at risk for developing blood clots (thrombosis). This is a serious problem. To help prevent it:   As much as possible, use the arm with the PICC in it for normal daily activities. Lack of movement can lead to blood clots. So it's important to move your arm as you normally would. Your healthcare team may suggest light arm exercises.     Avoid activities or exercises that require major use of your arm, such as sports, unless your healthcare provider says it's OK.     Avoid any activities that cause mild pain in your arm. Talk to your healthcare team if you have concerns about pain or range of motion.     Don't lift anything heavier than 10 pounds with the affected arm.     Drink plenty of water. Staying hydrated helps keep clots from forming.    Standard of Care for Catheter Maintenance & Dressing Changes    A Sterile dressing change is indicated at least once every seven (7)  days or if otherwise indicated. Assess the dressing more frequently in the first 24 hours for accumulation of blood, fluid, or moisture beneath the dressing. Periodically confirm catheter placement, patency, and security of dressing.     Flushing  Administer a brisk pulsatile flush using a 66mL syringe of 0.9% normal saline at least once every twelve (12) hours or before/after catheter use. In addition, administer Heparin (10unit/mL) flush  3-7mL syringe once every twenty-four (24) hours after normal saline flush.      Securement Device  Statlock Stabilization Device must remain in place for the duration of the catheter and to be replaced at least once every seven (7) days in accordance with a sterile dressing change, or if otherwise indicated.    Removable Caps  This institution uses removable caps on the tips of each lumen. Each cap must  be changed at least once every seven (7) days in accordance with a sterile dressing change, or if otherwise indicated.       Prevent infection with good hand hygiene    A PICC can let germs into your body. This can lead to serious and sometimes deadly infections. To prevent infection, it's very important that you, your caregivers, and others around you use good hand hygiene. This means washing your hands well with soap and water, and cleaning them with an alcohol-based hand gel as directed. Never touch the PICC or dressing without first using one of these methods.    To wash your hands with soap and water:   Wet your hands with warm water. (Avoid hot water, which can cause skin irritation when you wash your hands often.)     Apply enough soap to cover the whole surface of your hands, including your fingers.     Rub your hands together vigorously for at least 15 seconds. Make sure to rub the front and back of each hand up to the wrist, your fingers and fingernails, between the fingers, and each thumb.     Rinse your hands with warm water.     Dry your hands completely with a new, unused paper towel. Don't use a cloth towel or other reusable towel. These can harbor germs.     Use the paper towel to turn off the faucet, then throw it away. If you're in a bathroom, also use a paper towel to open the door instead of touching the handle.    When you don't have access to soap and water:   Use an alcohol-based hand gel to clean your hands. The gel should have at least 60% alcohol. Follow the instructions on the  package. Your healthcare team can answer any questions you have about when to use hand gel, or when it's better to wash with soap and water.     When to seek medical care  Call your provider right away if you have any of the following:   Pain or burning in your shoulder, chest, back, arm, or leg   Fever of 100.56F (38.0C) or higher   Chills   Signs of infection at the catheter site (pain, redness, drainage, burning, or stinging)   Coughing, wheezing, or shortness of breath   A racing or irregular heartbeat   Muscle stiffness or trouble moving   Tightness in your arm, above the catheter site   Gurgling noises coming from the catheter   The catheter falls out, breaks, cracks, leaks, or has other damage       Date Last Reviewed: 08/15/2014   2000-2016 [employer]. [address], [place] [address]. All rights reserved. This information is not intended as a substitute for professional medical care. Always follow your healthcare professional's instructions.

## 2016-03-08 NOTE — Progress Notes (Signed)
PROGRESS NOTE    Date Time: 03/08/16 9:29 AM  Patient Name: [name],[name]    Assessment:   Active Problems:    Osteomyelitis      Plan:   Await final susceptibility to arrange out-pt abx    Subjective:   More pain in foot with this procedure    Medications:     Current Facility-Administered Medications   Medication Dose Route Frequency   . aspirin  81 mg Oral Daily   . atorvastatin  10 mg Oral QHS   . famotidine  20 mg Oral Daily   . gabapentin  300 mg Oral TID   . heparin (porcine)  5,000 Units Subcutaneous Q8H   . insulin aspart  7 Units Subcutaneous TID AC   . insulin glargine  20 Units Subcutaneous QAM   . insulin glargine  40 Units Subcutaneous QHS   . lactobacillus species  50 Billion CFU Oral Daily   . sodium chloride  1,000 mL Intravenous Once in ED   . vancomycin  1,000 mg Intravenous Q8H   . vancomycin therapy placeholder   Does not apply See Admin Instructions       Physical Exam:     Vitals:    03/08/16 0754   BP: 140/77   Pulse: 92   Resp: 17   Temp: 98.4 F (36.9 C)   SpO2: 98%       Intake and Output Summary (Last 24 hours) at Date Time    Intake/Output Summary (Last 24 hours) at 03/08/16 0929  Last data filed at 03/08/16 3419   Gross per 24 hour   Intake             3484 ml   Output             1405 ml   Net             2079 ml       Afebrile; S aureus isolated from bone     Labs:     Results     Procedure Component Value Units Date/Time    Anaerobic Culture [[id number]] Collected:  03/07/16 1508    Specimen:  Abscess from Left Toe Updated:  03/08/16 0851    Narrative:       Specimen/Source: Abscess/Left Toe  Collected: 03/07/2016 15:08     Status: Valued      Last Updated: 03/08/2016 08:51                Culture Result (Prelim)      No Anaerobes Isolated - Day 1, Reincubate          Dextrose Stick Glucose [[id number]]  (Abnormal) Collected:  03/08/16 0754    Specimen:  Blood Updated:  03/08/16 0811     Glucose, POCT 113 (H) mg/dL     Dextrose Stick Glucose [[id number]]  (Abnormal) Collected:   03/07/16 1646    Specimen:  Blood Updated:  03/08/16 0734     Glucose, POCT 123 (H) mg/dL     Abscess Culture and Smear [[id number]] Collected:  03/07/16 1508    Specimen:  Abscess from Left Toe Updated:  03/08/16 0726    Narrative:       Specimen/Source: Abscess/Left Toe  Collected: 03/07/2016 15:08     Status: Valued      Last Updated: 03/08/2016 07:26           (1) *Specimen Abscess             Gram Stain (  Final)      Few WBC's Seen      No Organisms Seen       ISO #1 (Prelim)      Scant Growth      Staphylococcus aureus      Sensitivity To Follow          Basic Metabolic Panel [[id number]]  (Abnormal) Collected:  03/08/16 0418    Specimen:  Plasma Updated:  03/08/16 0544     Sodium 138 mMol/L      Potassium 3.8 mMol/L      Chloride 107 mMol/L      CO2 24.7 mMol/L      Calcium 8.4 (L) mg/dL      Glucose 131 (H) mg/dL      Creatinine 0.93 mg/dL      BUN 10 mg/dL      Anion Gap 10.1 mMol/L      BUN/Creatinine Ratio 10.8 Ratio      EGFR 94 mL/min/1.44m2      Osmolality Calc 277 mOsm/kg     Magnesium [[id number]] Collected:  03/08/16 0418    Specimen:  Plasma Updated:  03/08/16 0544     Magnesium 1.6 mg/dL     CBC and differential [[id number]]  (Abnormal) Collected:  03/08/16 0418    Specimen:  Blood from Blood Updated:  03/08/16 0542     WBC 8.0 K/cmm      RBC 3.49 (L) M/cmm      Hemoglobin 10.1 (L) gm/dL      Hematocrit 31.3 (L) %      MCV 90 fL      MCH 29 pg      MCHC 32 gm/dL      RDW 12.0 %      PLT CT 223 K/cmm      MPV 8.6 fL      NEUTROPHIL % 68.7 %      Lymphocytes 20.6 %      Monocytes 7.9 %      Eosinophils % 2.2 %      Basophils % 0.5 %      Neutrophils Absolute 5.5 K/cmm      Lymphocytes Absolute 1.7 K/cmm      Monocytes Absolute 0.6 K/cmm      Eosinophils Absolute 0.2 K/cmm      BASO Absolute 0.0 K/cmm     Dextrose Stick Glucose [[id number]]  (Abnormal) Collected:  03/07/16 2113    Specimen:  Blood Updated:  03/07/16 2129     Glucose, POCT 135 (H) mg/dL     Dextrose Stick Glucose [[id number]]  (Abnormal)  Collected:  03/07/16 1556    Specimen:  Blood Updated:  03/07/16 1613     Glucose, POCT 137 (H) mg/dL     Dextrose Stick Glucose [[id number]]  (Abnormal) Collected:  03/07/16 1151    Specimen:  Blood Updated:  03/07/16 1208     Glucose, POCT 194 (H) mg/dL           Rads:   Radiological Procedure reviewed.    Signed by: Hardin Negus, MD

## 2016-03-08 NOTE — Progress Notes (Addendum)
[place] [id number]     INITIAL ASSESSMENT  Case Management       Estimated D/C Date:     1/25   RX Coverage:       [facility]    Inpatient Plan of Care:      S/P Irrigation and debridement, left foot  Excision of 2nd metatarsal head, left foot,    pain mgt.,  DVT prophylactics, pulmonary toilet, IV antibiotics, Inf Disease MD consult, wound care , podiatry consult,  Roberts planning, DM mgt., medical mgt   CM Interventions:      Electronic chart reviewed.     Pt known to me from previous admissions. Pt has been [place]'ed on outpt IV antibiotic therapy in the past using [facility]      Uploaded documents into [facility].    Per Dr Luberta Robertson waiting on culture results to decide a [place] plan. Pt will need a midline or PICC line ordered and placed prior [place].    NCM to follow          03/08/16 1509   Patient Type   Within 30 Days of Previous Admission? No   Healthcare Decisions   Interviewed: Patient   Orientation/Decision Making Abilities of Patient Alert and Oriented x3, able to make decisions   Prior to admission   Prior level of function Independent with ADLs  (working)   Type of Residence Private residence   Have running water, electricity, heat, etc? Yes   Living Arrangements Spouse/significant other   How do you get to your MD appointments? self/others   How do you get your groceries? self/others   Who fixes your meals? self/others   Who does your laundry? self/others   Who picks up your prescriptions? self/others   Dressing Independent   Grooming Independent   Feeding Independent   Cath[place] Independent   Discharge Planning   Support Systems Spouse/significant other   Patient expects to be discharged to: (home)   Mode of transportation: Private car (family member)   Consults/Providers   PT Evaluation Needed 2   OT Evalulation Needed 2   SLP Evaluation Needed 2   Correct PCP listed in Epic? Yes   [facility]  Orthopaedic Nurse Case Manager  [phone]

## 2016-03-08 NOTE — Progress Notes (Signed)
Patient: [name]  Date: 03/08/2016   LOS: 4 Days  Admission Date: 03/04/2016   MRN: [id number]  Attending: Ernestene Kiel, MD     SUBJECTIVE     51 year old male with history of diabetes who has not been taking his insulin presented to the emergency department with worsening left foot pain and erythema found to have osteomyelitis with associated cellulitis. Patient started on broad-spectrum antibiotics.  Podiatry consulted  and did Irrigation and debridement, left foot,Excision of 2nd metatarsal head, left foot on 03/07/2016. We  Consulted Dr.Galbraith from ID.      No new complaint.      MEDICATIONS     Current Facility-Administered Medications   Medication Dose Route Frequency   . aspirin  81 mg Oral Daily   . atorvastatin  10 mg Oral QHS   . famotidine  20 mg Oral Daily   . gabapentin  300 mg Oral TID   . heparin (porcine)  5,000 Units Subcutaneous Q8H   . insulin aspart  7 Units Subcutaneous TID AC   . insulin glargine  20 Units Subcutaneous QAM   . insulin glargine  40 Units Subcutaneous QHS   . lactobacillus species  50 Billion CFU Oral Daily   . sodium chloride  1,000 mL Intravenous Once in ED   . vancomycin  1,000 mg Intravenous Q8H   . vancomycin therapy placeholder   Does not apply See Admin Instructions     . sodium chloride 50 mL/hr at 03/08/16 0529           PHYSICAL EXAM     Objective:  I/O last 3 completed shifts:  In: 1991 [P.O.:850; I.V.:2994]  Out: 1405 [Urine:1400; Blood:5]     Vitals:    03/08/16 0000 03/08/16 0435 03/08/16 0754 03/08/16 1153   BP: 113/59 116/66 140/77 138/82   Pulse: 96 89 92 90   Resp: 16 16 17 17    Temp: 99.2 F (37.3 C) 98.4 F (36.9 C) 98.4 F (36.9 C) 97.8 F (36.6 C)   TempSrc: Oral Oral Oral Oral   SpO2: 96% 97% 98% 98%   Weight:       Height:         Wt Readings from Last 4 Encounters:   03/05/16 109 kg (240 lb 4.8 oz)   01/21/16 113.4 kg (250 lb)   07/09/15 112 kg (247 lb)   05/04/15 104.3  kg (230 lb)          Exam: Lying comfortably in his bed  HEENT: PERRLA, EOMI  Neck: supple, without JVD  Chest: Fair air entry bilaterally  CVS: S1, S2 regular rate and rhythm  no murmurs    Abdomen: soft and non tender with good bowel sounds.  Musculoskeletal: Left lower extremity edema with erythema and tenderness ulcer on the plantar aspect of the foot with necrotic base pulses palpable  Skin: Warm dry no worrisome lesions  NEURO:  Cranial nerve II-12 intact no motor or sensory deficits  Psychiatric: alert, interactive, appropriate, normal affect      LABS       Recent Labs  Lab 03/08/16  0418 03/07/16  0610 03/05/16  0345 03/04/16  1715   WBC 8.0 7.0 11.5* 10.8   RBC 3.49* 3.79* 3.92* 4.35   Hemoglobin 10.1* 11.2* 11.7* 13.0   Hematocrit 31.3* 34.2* 34.9* 38.9*   MCV 90 90 89 89   PLT CT 223 199 177 198       Recent Labs  Lab  03/08/16  0418 03/07/16  0610 03/06/16  1207 03/05/16  0345 03/04/16  2144  03/04/16  1715   Sodium 138 137  --  137 132*  --  134*   Potassium 3.8 3.7  --  3.1* 4.1  --  3.9   Chloride 107 107  --  106 103  --  100   CO2 24.7 23.0  --  22.1 23.5  --  25.1   BUN 10 9  --  21 27*  --  26*   Creatinine 0.93 0.83 0.80 0.96 1.13  --  1.32*   i-STAT Creatinine  --   --   --   --   --  More results in Results Review  --    Glucose 131* 219*  --  94 511*  --  450*   Calcium 8.4* 8.1*  --  7.8* 7.8*  --  8.8   Magnesium 1.6  --   --   --   --   --   --    More results in Results Review = values in this interval not displayed.            Invalid input(s): OSMOL          No results found for: BNP       (!) 135 (The above 1 analytes were performed by [facility] [phone]) [address],[place] [address] )  Invalid input(s): BLOODCULTURE      RADIOLOGY     Xr Foot Left Ap Lateral And Oblique    Result Date: 03/07/2016  Status post osteotomy of the distal second metatarsal. ReadingStation:[id number]    Xr Foot Left Ap Lateral And Oblique    Result Date: 03/04/2016  There is  resorption of the bone either side of previously seen first left metatarsal osteotomy, concerning for osteomyelitis. There is also a possible fracture or erosion through the distal neck of the fourth metatarsal bone of the left foot. There is medial subluxation at the second metatarsophalangeal joint of the left foot, possibly neuropathic. There is diffuse left foot soft tissue swelling. ReadingStation:[id number]    Mri Foot Left W Wo Contrast    Result Date: 03/05/2016  1. Osteomyelitis of the first metatarsal phalangeal joint, with associated phlegmon that extends into the volar aspect of the first metatarsal head. Clinical correlation is requested for a single linear focus of susceptibility artifact identified within the mid to distal first metatarsal shaft. 2. Osteomyelitis of the second metatarsal which is also plantarly dislocated. 3. Pathologic fracture with osteomyelitis of the third metatarsal head/neck region which is also plantarly subluxed. 4. Comminuted pathologic fracture with osteomyelitis of the fourth metatarsal head. 5. Diffuse myositis. 6. Signal irregularity identified along the lateral base of the calcaneus is of uncertain clinical significance and could represent a focus of osteomyelitis. ReadingStation:[id number]        Assessment and Plan:     - Sepsis secondary to first left metatarsal osteomyelitis also associated cellulitis     MRI showed osteomyelitis of the first metatarsal joint associated with phlegmon there is also osteomyelitis of the second Metatarsal pathological fracture with osteomyelitis of the third metatarsal head communicated pathological fracture with osteomyelitis of the fourth metatarsal head and diffuse myositis. Podiatry did Irrigation and debridement, left foot,Excision of 2nd metatarsal head, left foot on 03/07/2016.Dr.Galbraith consulted.continue with vancomycin and zosyn until we get the sensitivity      - Type II diabetes ; patient is on lantus 20 units in  am and 40 units  pm.he is also on novolog 7 units before meals when his blood sugar is above 150.    - Acute kidney injury resolved with aggressive IV hydration, lisinopril and metformin currently on hold. IV fluids discontinued    - Diabetic neuropathy on gabapentin    - DVT prophylaxis;heparin          - Disposition- He will be discharged when we have bacterial sensitivity.     Ernestene Kiel, MD  03/08/2016 3:39 PM    I can be reached at [phone]    NOTE: This record is partially electronically generated (including the use of voice recognition technology) and may contain additions and omissions unintended by user.

## 2016-03-08 NOTE — Progress Notes (Signed)
Pharmacy Vancomycin Dosing Consult Note  [name]    Assessment:   1. Vancomycin day #5 in a 74 YOM who presents with diabetic ulcer on his foot.  Patient stopped taking his insulin.  Blood sugars uncontrolled and patient denies trauma. Over the past 3 weeks patient noticed worsening erythema of the left left now extending to his knees. Podiatry and ID consulted. X-ray of foot concerning for osteomyelitis and MRI confirmed osteo in several places. OR 1/22 for I&D and excisional wound debridement with excision of 2nd metatarsal head.  2. WBC wnl, now afebrile, SCr wnl New abscess culture from L toe collected in OR 1/22 growing staph aureus, awaiting ID and sensitivities before determining abx plan per ID.      Plan:   1. Continue vancomycin 1000 mg IV every 8 hours.  2. Trough due 03/09/2016 at 0700.  3. Pharmacy will follow the patient's renal function, vancomycin levels, and dosing during the course of therapy. If you have any questions, please contact the pharmacist at [phone].     Indication: Osteomyelitis left foot    Age: 51 y.o.  Height: 1.676 m (5\' 6" )  Weight:  109 kg (240 lb 4.8 oz)  IBW: 66.1 kg  DW: 80 kg    CrCL: > 120 ml/min    Other Nephrotoxic Drugs: pip/tazo, ASA    Drug Levels:   Trough level: 9.26 on 1/21 at [place] level: due on 03/09/2016 at 0700     New Pharmacokinetics:   t50: 6.2 hrs         Kel: 0.111 hr-1        Vd: 57.3 L         Expected Trough: 13 mg/L                Recent Labs  Lab 03/08/16  0418 03/07/16  0610 03/06/16  1207 03/05/16  0345 03/04/16  2144   Creatinine 0.93 0.83 0.80 0.96 1.13   BUN 10 9  --  21 27*       Recent Labs  Lab 03/08/16  0418 03/07/16  0610 03/05/16  0345 03/04/16  1715   WBC 8.0 7.0 11.5* 10.8     Temp (24hrs), Avg:98.1 F (36.7 C), Min:97.2 F (36.2 C), Max:99.2 F (37.3 C)      Cultures:   Date Source Organism Sensitivities Resistance   1/19 blood ngtd     1/19 Wound L foot ngtd     1/22 L toe abscess Staph aureus

## 2016-03-08 NOTE — Plan of Care (Signed)
Problem: Pain  Goal: Pain at adequate level as identified by patient  Outcome: Progressing   03/08/16 0012   Goal/Interventions addressed this shift   Pain at adequate level as identified by patient Identify patient comfort function goal;Assess for risk of opioid induced respiratory depression, including snoring/sleep apnea. Alert healthcare team of risk factors identified.;Assess pain on admission, during daily assessment and/or before any "as needed" intervention(s);Reassess pain within 30-60 minutes of any procedure/intervention, per Pain Assessment, Intervention, Reassessment (AIR) Cycle;Evaluate if patient comfort function goal is met;Evaluate patient's satisfaction with pain management progress;Offer non-pharmacological pain management interventions;Consult/collaborate with Physical Therapy, Occupational Therapy, and/or Speech Therapy;Include patient/patient care companion in decisions related to pain management as needed       Problem: Side Effects from Pain Analgesia  Goal: Patient will experience minimal side effects of analgesic therapy  Outcome: Progressing   03/08/16 0012   Goal/Interventions addressed this shift   Patient will experience minimal side effects of analgesic therapy Monitor/assess patient's respiratory status (RR depth, effort, breath sounds);Assess for changes in cognitive function;Prevent/manage side effects per LIP orders (i.e. nausea, vomiting, pruritus, constipation, urinary retention, etc.);Evaluate for opioid-induced sedation with appropriate assessment tool (i.e. POSS)       Problem: Moderate/High Fall Risk Score >5  Goal: Patient will remain free of falls  Outcome: Progressing   03/08/16 0000   OTHER   Moderate Risk (6-13) MOD-([facility] Only) Yellow "Fall Risk" signage;MOD-([facility] Only) Yellow slippers;MOD-([facility] Only) Apply yellow "Fall Risk" arm band;MOD-Use of assistive devices-bedside commode if appropriate

## 2016-03-08 NOTE — Op Note (Signed)
SURGEON: Dr. Evette Doffing DPM    ASSISTANT: Pilar Grammes DPM    Date of Surgery: 03/07/16    PREOPERATIVE DIAGNOSIS:  Osteomyelitis, left foot  Abscess, left foot    POSTOPERATIVE DIAGNOSIS:  Osteomyelitis, left foot  Abscess, left foot    TITLE OF PROCEDURE:  Irrigation and debridement, left foot  Excision of 2nd metatarsal head, left foot    ANESTHESIA:  MAC    ESTIMATED BLOOD LOSS:  5 mL    HEMOSTASIS:  Manual compression    IMPLANTS:  None    MATERIALS:  3-0 nylon    INJECTABLES:  8cc 0.5% marcaine plain    PATHOLOGY:  1. 2nd metatarsal head for culture pathology    ANTIBIOTICS:  Receiving antibiotics on the floot    COMPLICATIONS:  None.    INDICATIONS FOR PROCEDURE  Patient presents to [facility] with a chief complaint of a worsening left foot wound. He has history of a chronic ulceration to the plantar left 2nd metatarsal head which subsequently developed osteomyelitis. The patient has failed conservative treatment including local wound care and antibiotics.  At this time the patient has elected to proceed with surgical irrigation and debridement and excision of the 2nd metatarsal head. All alternatives, risks and complications of the procedures were thoroughly explained to the patient. Patient exhibits appropriate understanding of all discussion points and informed consent signed and obtained in chart with no guarantees to surgical outcome given or implied.    DESCRIPTION OF PROCEDURE  Patient was brought to the operating room and placed on the operative table in the supine position. Patient was secured to the table with safety belt, contralateral SCD placed and all bony prominences were padded. Following surgical time out all members of the operating room and surgical site were identified, and MAC anesthesia occurred. Local anesthesia was then administered about the operative field in a local infiltrative block fashion utilizing 8cc 0.5% marcaine plain. The left foot was then prepped and draped in  the usual sterile manner and the following procedure then began.    Attention was directed to the left foot where an ulceration was noted to the plantar 2nd metatarsal head. The ulceration was full thickness with purulent drainage. At this time a linear incision was made on the dorsal aspect of the foot overlying the 2nd metatarsal head to the level of the subcutaneous tissue. Sharp and blunt dissection continued to the level of the capsule. The tissue was necrotic in appearance with purulence noted. There was no proximal tracking appreciated. The plantar ulceration tracked within the 1st interspace. Soft tissue attachments were sharply reflected from the head of the 2nd metatarsal and a sagittal saw was used to resect the head. The head was soft in nature, consistent with osteomyelitis. The head was sent for culture and pathology. A rongeur was used to remove any non viable tissue. A 15 blade was then used to sharply excise the plantar ulceration to the level of the subcutaneous tissue.    The surgical site was then copiously flushed with sterile normal saline. The wound was packed with iodoform packing. The dorsal incision was closed with nylon. The surgical site was then dressed with 4x4, kerlix and an ACE wrap. The patient tolerated both the procedure and anesthesia well with vital signs stable throughout. The patient was transferred from the OR to recovery under the discretion of anesthesia with vital signs stable and neurovascular status intact to the operative limb.    The patient will follow the  protocol of rest, ice, and elevation and will be non weight bearing to the operative limb. The patient will be re-admitted to floors under primary service for continued medical management and IV antibiotics. Dr. Evette Doffing will follow the patient throughout the entire post-operative course and the patient is aware of all post-operative protocols in place.    Pilar Grammes DPM PGY-2

## 2016-03-08 NOTE — Progress Notes (Signed)
PODIATRIC SURGERY PROGRESS NOTE   Resident pager [phone]    Date Time: 03/08/16 4:33 PM  Patient Name: [name] Ambulatory Surgery Day: 5    Assessment:   51 y.o. 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 uncontrolled DM2 with neuropathy and non compliance with management of diabetes s/p I&D left foot with excision of 2nd metatarsal head    Plan:   - Dressing intact to left foot. Will change at bedside tomorrow per podiatry  - Continue IV antibiotics per ID. Intraoperative culture sensitivities pending  - Heel weightbearing in surgical shoe  - Will discuss plan with attending, continue to follow    Subjective:   Patient evaluated at bedside in NAD.     Physical Exam:     Vitals:    03/08/16 1609   BP: 128/62   Pulse: 85   Resp: 16   Temp: 98.5 F (36.9 C)   SpO2: 98%     Left Lower Extremity Exam  Dressing intact to left foot with no strikethrough Calf non tender.    Labs:   Labs (last 72 hours):      Recent Labs  Lab 03/08/16  0418 03/07/16  0610   WBC 8.0 7.0   Hemoglobin 10.1* 11.2*   Hematocrit 31.3* 34.2*   PLT CT 223 199            Recent Labs  Lab 03/08/16  0418 03/07/16  0610   Sodium 138 137   Potassium 3.8 3.7   Chloride 107 107   CO2 24.7 23.0   BUN 10 9   Creatinine 0.93 0.83   Calcium 8.4* 8.1*   Glucose 131* 219*                 Microbiology:  Microbiology Results     Procedure Component Value Units Date/Time    Abscess Culture and Smear [[id number]] Collected:  03/07/16 1508    Specimen:  Abscess from Left Toe Updated:  03/08/16 0726    Narrative:       Specimen/Source: Abscess/Left Toe  Collected: 03/07/2016 15:08     Status: Valued      Last Updated: 03/08/2016 07:26           (1) *Specimen Abscess             Gram Stain (Final)      Few WBC's Seen      No Organisms Seen       ISO #1 (Prelim)      Scant Growth      Staphylococcus aureus      Sensitivity To Follow          Anaerobic Culture [[id number]] Collected:  03/07/16 1508    Specimen:  Abscess from Left Toe  Updated:  03/08/16 0851    Narrative:       Specimen/Source: Abscess/Left Toe  Collected: 03/07/2016 15:08     Status: Valued      Last Updated: 03/08/2016 08:51                Culture Result (Prelim)      No Anaerobes Isolated - Day 1, Reincubate          Blood Culture - Venipuncture # 1 [[id number]] Collected:  03/04/16 1715    Specimen:  Blood from Venipuncture Updated:  03/06/16 0754    Narrative:       Specimen/Source: Blood/Venipuncture  Collected: 03/04/2016 17:15     Status: Valued  Last Updated: 03/06/2016 07:53           (1) *Specimen Blood      *Source Detail Venipuncture             Culture Result (Prelim)      No Growth To Date          Influenza A / B Rapid Test [[id number]] Collected:  03/04/16 1715    Specimen:  Nasal Wash Updated:  03/04/16 1741     Influenza A Negative     Influenza B Negative     Comment: Method: Collier Bullock    The sensitivity for this method is between 90% and 95% for Influenza A and around 90% for Influenza B. The specificity for both Influenza A and B is around 96%. False positive results may occur, especially when the prevalence of Influenza activity is low. This is more likely with Influenza B due to its lower prevalence. Clinical conditions, including the prevalence of influenza activity, should be considered in the interpretation of results. If clinically indicated, results may be confirmed with PCR testing.  The above 2 analytes were performed by [facility] [phone])  [address] [address]         Narrative:       Influenza A antigen detection tests are unable to distinquish between novel and seasonal influenza A.    A negative result for either Influenza A or B antigen does not exclude influenza virus infection. Clinical correlation required.    All positive influenza antigen tests (A or B) require placement of patient on droplet precaution isolation.    Wound Culture and Smear [[id number]] Collected:  03/04/16 1748    Specimen:   Wound from Left Foot Updated:  03/06/16 0701    Narrative:       Specimen/Source: Wound/Left Foot  Collected: 03/04/2016 17:48     Status: Final      Last Updated: 03/06/2016 06:27           (1) *Specimen Wound      *Source Detail Left Foot             Gram Stain (Final)      Many WBC's Seen      Many Gram Positive Cocci       ISO #1 (Final)      Moderate Growth      Mixed skin microbiota                     Radiology:   Results reviewed.  Xr Foot Left Ap Lateral And Oblique    Result Date: 03/07/2016  Status post osteotomy of the distal second metatarsal. ReadingStation:[id number]    Xr Foot Left Ap Lateral And Oblique    Result Date: 03/04/2016  There is resorption of the bone either side of previously seen first left metatarsal osteotomy, concerning for osteomyelitis. There is also a possible fracture or erosion through the distal neck of the fourth metatarsal bone of the left foot. There is medial subluxation at the second metatarsophalangeal joint of the left foot, possibly neuropathic. There is diffuse left foot soft tissue swelling. ReadingStation:[id number]    Mri Foot Left W Wo Contrast    Result Date: 03/05/2016  1. Osteomyelitis of the first metatarsal phalangeal joint, with associated phlegmon that extends into the volar aspect of the first metatarsal head. Clinical correlation is requested for a single linear focus of susceptibility artifact identified within the mid to distal first metatarsal  shaft. 2. Osteomyelitis of the second metatarsal which is also plantarly dislocated. 3. Pathologic fracture with osteomyelitis of the third metatarsal head/neck region which is also plantarly subluxed. 4. Comminuted pathologic fracture with osteomyelitis of the fourth metatarsal head. 5. Diffuse myositis. 6. Signal irregularity identified along the lateral base of the calcaneus is of uncertain clinical significance and could represent a focus of osteomyelitis. ReadingStation:[id number]        Signed by: Pilar Grammes DPM  PGY-2

## 2016-03-08 NOTE — Consults (Signed)
Peripherally Inserted Central Catheter (PICC) PROCEDURE NOTE      Myler,Jamareon E  03/08/2016      INDICATIONS: Home care intravenous therapy    CONSENT: The procedure, risks, benefits and alternatives were discussed with the patient.  All questions were answered, and informed written consent was obtained from the patient.  Informed consent and Procedure Boarding Pass were completed.     Catheter Type: Power picc 76f single lumen  Insertion Site (vein):Right Basilic  Total Length: 45 cm  Internal Length: 44 cm  External Length: 1 cm  UAC: 32 cm    PICC Kit Lot #: [id number]    PICC inserted by: Verdis Frederickson RN VAS    PROCEDURE DETAILS:  Ultrasound was used to confirm patency of the Right Basilic vein prior to obtaining venous access. The area to be punctured was cleansed with chlorhexidine 2% for more than 30 seconds and the site draped using maximal sterile barrier precautions.    After 1% lidocaine injected followed by puncture of the Right Basilic with a 72-SPZZC single-wall needle under direct sonographic guidance. Guidewire was advanced and the needle was removed and a peel-away sheath was placed. The catheter was then trimmed and advanced through the peel-away sheath using electronic navigation.  The sheath was removed, brisk blood return confirmed, the catheter was flushed with normal saline and capped.      Maximal sterile barrier was used: cap, mask, sterile gloves, sterile gown and body drape.  All guide wires were removed with tip intact by visual inspection.  The catheter was stabilized on the skin using a securement device.  Antimicrobial disc and sterile transparent occlusive dressing applied using aseptic technique.     Patient did tolerate procedure well.        FINDINGS/CONCLUSIONS:   No signs of bleeding or symptoms of nerve irritation noted.  Final tip location was confirmed utilizing ECG tip confirmation.    PICC is ready for use  PICC education / instructions provided to patient.      CATHETER  CARE    Dressing Changes:  A Sterile dressing change is indicated at least once every seven (7) days or if otherwise indicated.    Assess the dressing more frequently in the first 24 hours for accumulation of blood, fluid, or moisture beneath the dressing.     Periodically confirm catheter placement, patency, and security of dressing.     Securement Device:  Statlock Stabilization Device must remain in place for the duration of the catheter and to be replaced at least once every seven (7) days in accordance with a sterile dressing change, or if otherwise indicated.    Removable Caps:  This institution uses removable caps on the tips of each lumen. Each cap must be changed at least once every seven (7) days in accordance with a sterile dressing change, or if otherwise indicated.     Flushing:  Administer a brisk pulsatile flush using a 56mL syringe of 0.9% normal saline at least once every twelve (12) hours or before/after catheter use. In addition, administer Heparin (10unit/mL) flush 3-92mL syringe once every twenty-four (24) hours after normal saline flush.

## 2016-03-08 NOTE — Progress Notes (Signed)
ED Note:     Received message from ED pharmacy that pnt states he has not taken most of his meds for months due having lost his insurance and not being able to afford them. Message sent to inpatient case manager.     Jonette Eva, RN, BSN, ACM  Case Management/Utilization Review Emergency Department  [phone]  Office Hours: Monday - Friday 10 a.m. to 6 p.m.

## 2016-03-08 NOTE — Plan of Care (Signed)
Problem: Pain  Goal: Pain at adequate level as identified by patient  Outcome: Progressing      Problem: Compromised Tissue integrity  Goal: Damaged tissue is healing and protected  Outcome: Progressing      Problem: Moderate/High Fall Risk Score >5  Goal: Patient will remain free of falls  Outcome: Progressing

## 2016-03-08 NOTE — PT Eval Note (Addendum)
[facility]: [name]  Department of Rehabilitation Services: [phone]  [name]    CSN: [id number]    ORTHOPAEDICS   [id number]/[id number]    Physical Therapy Evaluation    Time of treatment:  Time Calculation  PT Received On: 03/08/16  Start Time: 1142  Stop Time: 1203  Time Calculation (min): 21 min    Visit#: 1                                                                                 Precautions and Contraindications:   Weight Bearing: Left LE Non weight bearing  Mobility protocol     Clinical Presentation and Decision Making     PT Assessment:  [name] was admitted 03/04/2016 s/p left 2nd metatarsal head resection due to diabetic ulcer. Debridement and irrigation of the left lower extremity.     Patient presenting with the following PT Impairments:decreased activity tolerance, decreased functional mobility, pain    Patient will benefit from skilled PT services in order to [name] LISTED IMPAIRMENTS     Due to the presence of a limited number of treatment options and 1-2 comorbidities or personal factors that affect performance, as well as patient's stable and/or uncomplicated characteristics, minimal to moderate modifications of mobility and/or assistance were necessary to complete evaluation when examining total of 3 elements (includes body structures and functions, activity limitations and/or participation restriction) determines the degree of complexity for this patient is LOW    Rehabilitation Potential:Good    Discussed risk, benefits and Plan of Care with: Patient    DISCHARGE RECOMMENDATIONS   DME recommended for Discharge:   Knee walker    Discharge Recommendations:   Home with supervision          Development of Plan of Care:     Goals:    N/A  Treatment/interventions: No skilled acute care interventions needed at this time.    Treatment Frequency: one time visit    History:   History of Present Illness:    Medical Diagnosis: Diabetic foot infection [E11.69,  L08.9]    [name] is a 51 y.o. male admitted on 03/04/2016 s/p left 2nd metatarsal head resection due to diabetic ulcer. Debridement and irrigation of the left lower extremity.     Patient Active Problem List   Diagnosis   . Type 2 diabetes mellitus with diabetic neuropathy   . Carpal tunnel syndrome of right wrist   . Nicotine abuse   . Diabetic ulcer of left foot associated with type 2 diabetes mellitus   . Acute renal failure   . Diabetes mellitus with foot ulcer   . HTN (hypertension)   . AKI (acute kidney injury)   . Hyperlipemia   . Diabetic foot ulcer   . Microalbuminuria   . Type 2 diabetes mellitus with diabetic polyneuropathy, with long-term current use of insulin   . Osteomyelitis of left foot, unspecified chronicity   . Osteomyelitis          Past Medical/Surgical History:  Past Medical History:   Diagnosis Date   . Complication of anesthesia     last time he was kicking his legs had  to wake him up   . Diabetes mellitus    . Diabetic ulcer of left foot 12/26/14   . Disc    . Gastroesophageal reflux disease    . Hyperlipidemia    . Hypertension    . Seasonal allergic rhinitis    . Sleep apnea     not tested   . Type 2 diabetes mellitus, controlled       Past Surgical History:   Procedure Laterality Date   . ARTHRODESIS, TOE Left 05/04/2015    Procedure: ARTHRODESIS, TOE;  Surgeon: Janora Norlander, DPM;  Location: [facility];  Service: Podiatry;  Laterality: Left;  1st MPJ FUSION   . DEBRIDEMENT & IRRIGATION, LOWER EXTREMITY Left 03/12/2015    Procedure: [place], LOWER EXTREMITY;  Surgeon: Janora Norlander, DPM;  Location: [facility];  Service: Podiatry;  Laterality: Left;  I&D LEFT FOOT FOR BONE INFECTION   . HAND SURGERY           Social History:    Home Living Arrangements:  Living Arrangements: Spouse/significant other, Family members, Friends, Limited assistance available due to the work schedule's and health of others living in the  home  Assistance Available: Part time  Type of Home: House  Home Layout: Two level, with no stairs to enter, patient can live on the 1st level    Prior Level of Function:  Community ambulation  Mobility:  Independent with  No assistive device  Additional comments: Employed at [employer] full time. Currently on medical leave. Not physically active outside of work  Fall history: none    DME available at home:  [place] wheeled walker  Patient reports he will rent the knee walker  Subjective    "I'VE HAD THIS DONE TO BE BEFORE." "IT TOOK ME 6 MONTHS TO REHAB LAST TIME, I HOPE IT DOESN'T TAKE ME THAT LONG THIS TIME."  Patient is agreeable to participation in the therapy session. Nursing clears patient for therapy.    Patient/caregiver goal for PT:   N/A    Pain:  At Rest: 7/10  With Activity: 7/10  Location: Foot: left  Interventions: Medication (see eMAR)    Examination of Body Systems (Structures, Function, Activity and Participation)   Patient's medical condition is appropriate for Physical therapy intervention at this time    Observation of patient:  Patient is in bed with continuous pulse oximeter, SCD's     Cognition:  Oriented to: Oriented x4  Command following: Follows 1 step commands without difficulty  Alertness/Arousal: Appropriate responses to stimuli   Attention Span:Appears intact  Memory: Appears intact  Safety Awareness: Independent  Insights: Fully aware of deficits  Problem Solving: Able to problem solve independently    Vital Signs (Cardiovascular):  BP Supine:  130/82 mmHg  SpO2 at rest: 90%    Sensation: intact , to light touch     Balance:  Static Sitting:  WFL  Static Standing:  WFL            Musculoskeletal Examination:            Range of motion:  Not tested this session       Strength:  Right LE: Grossly WFL  Left LE: WFL except: L4, L5, S1 unable to be tested due to procesure    Tone:  Not tested    Functional Mobility:    Bed Mobility:  Rolling to Left:  Independent.        Rolling to Right:  Independent.       Supine scooting:  Independent   Supine to Sit:   Independent.        Sit to Supine:   Independent.        Seated Scooting:   Independent    Transfers:  Sit to Stand:  Modified independence  with Knee walker.         Stand to Sit:  Modified independence .           Locomotion:  LEVEL AMBULATION:  Distance: approx 200 ft   Assistance level:  Modified independence   Device:  Knee walker  Pattern:  Swing through with R leg to propel to the knee walker   Amb with front wheeled walker and supervision for safety from the bed to bathroom, performed a sit to stand and stand to sit transfer from the toilet, and ambulated back to bed. Maintained NWB LLE without difficulty.    AM-PACT "6 Clicks" Basic Mobility Inpatient Short Form  Turning Over in Bed: None  Sitting Down On/Standing From Armchair: None  Lying on Back to Sitting on Side of Bed: None  Assist Moving to/from Bed to Chair: None  Assist to Walk in Hospital Room: None  Assist to Climb 3-5 Steps with Railing: A little  PT Basic Mobility Raw Score: 23  CMS 0-100% Score: 11.20% Mobility G Code Set  Mobility, Current Status (D8978): At least 1 percent but less than 20 percent impaired, limited or restricted  Mobility, Goal Status (E7841): At least 1 percent but less than 20 percent impaired, limited or restricted  Mobility, D/C Status (Q8208): At least 1 percent but less than 20 percent impaired, limited or restricted  Tools used to determine level of impairment: AM-PACT "6 Clicks" Basic Mobility Score               Participation and Activity Tolerance:  Participation effort: Excellent  Activity Tolerance: Tolerates 10-20 minutes of activity without rest breaks    Treatment Interventions this session:   Evaluation  Gait training    Education Provided:   TOPICS: role of physical therapy, plan of care, goals of therapy and safety with mobility and ADLs, benefits of activity, weight bearing precautions, activity with nursing     Learner educated:  Patient  Method: Explanation  Response to education: Verbalized understanding and Demonstrated understanding    Patient Position at End of Treatment:   Supine, in bed, in the room and Needs in reach    Team Communication:     Spoke to: RN/LPN   Regarding: Pre-session re: patient status, Patient participation with Therapy  Whiteboard updated: No  PT/PTA communication: via written note and verbal communication as needed.      Sharyl Nimrod    ______________________________________________________________________    I have reviewed and agree with the documentation authored by my student of the evaluation/ treatment/education/care plan delivered during my direct supervision.     Co-Signed by:  Park Liter, PT  ______________________________________________________________________

## 2016-03-09 ENCOUNTER — Encounter: Payer: Self-pay | Admitting: Foot & Ankle Surgery

## 2016-03-09 LAB — VH DEXTROSE STICK GLUCOSE
Glucose POCT: 121 mg/dL — ABNORMAL HIGH (ref 70–99)
Glucose POCT: 107 mg/dL — ABNORMAL HIGH (ref 70–99)
Glucose POCT: 89 mg/dL (ref 70–99)
Glucose POCT: 108 mg/dL — ABNORMAL HIGH (ref 70–99)
Glucose POCT: 134 mg/dL — ABNORMAL HIGH (ref 70–99)

## 2016-03-09 LAB — CBC AND DIFFERENTIAL
Basophils %: 0 % (ref 0.0–3.0)
Basophils Absolute: 0 10*3/uL (ref 0.0–0.3)
Eosinophils %: 1.5 % (ref 0.0–7.0)
Eosinophils Absolute: 0.1 10*3/uL (ref 0.0–0.8)
Hematocrit: 32 % — ABNORMAL LOW (ref 39.0–52.5)
Hemoglobin: 10.6 gm/dL — ABNORMAL LOW (ref 13.0–17.5)
Lymphocytes Absolute: 2.4 10*3/uL (ref 0.6–5.1)
Lymphocytes: 27.9 % (ref 15.0–46.0)
MCH: 30 pg (ref 28–35)
MCHC: 33 gm/dL (ref 32–36)
MCV: 90 fL (ref 80–100)
MPV: 8.5 fL (ref 6.0–10.0)
Monocytes Absolute: 0.8 10*3/uL (ref 0.1–1.7)
Monocytes: 9.2 % (ref 3.0–15.0)
Neutrophils %: 61.4 % (ref 42.0–78.0)
Neutrophils Absolute: 5.2 10*3/uL (ref 1.7–8.6)
PLT CT: 220 10*3/uL (ref 130–440)
RBC: 3.56 10*6/uL — ABNORMAL LOW (ref 4.00–5.70)
RDW: 12.1 % (ref 11.0–14.0)
WBC: 8.5 10*3/uL (ref 4.0–11.0)

## 2016-03-09 LAB — VANCOMYCIN, TROUGH: Vancomycin Trough: 20.58 ug/mL (ref 10.00–20.00)

## 2016-03-09 LAB — BASIC METABOLIC PANEL
Anion Gap: 10.8 mMol/L (ref 7.0–18.0)
BUN / Creatinine Ratio: 10.3 Ratio (ref 10.0–30.0)
BUN: 11 mg/dL (ref 7–22)
CO2: 26.1 mMol/L (ref 20.0–30.0)
Calcium: 8.6 mg/dL (ref 8.5–10.5)
Chloride: 106 mMol/L (ref 98–110)
Creatinine: 1.07 mg/dL (ref 0.80–1.30)
EGFR: 79 mL/min/{1.73_m2} (ref 60–150)
Glucose: 120 mg/dL — ABNORMAL HIGH (ref 70–99)
Osmolality Calc: 278 mOsm/kg (ref 275–300)
Potassium: 3.9 mMol/L (ref 3.5–5.3)
Sodium: 139 mMol/L (ref 136–147)

## 2016-03-09 LAB — MAGNESIUM: Magnesium: 1.7 mg/dL (ref 1.6–2.6)

## 2016-03-09 MED ORDER — COLLAGENASE 250 UNIT/GM EX OINT
TOPICAL_OINTMENT | CUTANEOUS | Status: DC
Start: 2016-03-09 — End: 2016-03-10
  Filled 2016-03-09: qty 30

## 2016-03-09 MED ORDER — LISINOPRIL 20 MG PO TABS
20.0000 mg | ORAL_TABLET | Freq: Every day | ORAL | Status: DC
Start: 2016-03-09 — End: 2016-03-10
  Administered 2016-03-09 – 2016-03-10 (×2): 20 mg via ORAL
  Filled 2016-03-09 (×2): qty 1

## 2016-03-09 MED ORDER — STERILE WATER FOR INJECTION IJ SOLN
2.0000 g | Freq: Three times a day (TID) | INTRAMUSCULAR | Status: DC
Start: 2016-03-09 — End: 2016-03-10
  Administered 2016-03-09 – 2016-03-10 (×3): 2 g via INTRAVENOUS
  Filled 2016-03-09 (×4): qty 2000

## 2016-03-09 MED ORDER — VH MAGNESIUM SULFATE 2 G IN 50 ML IV PREMIX
2.0000 g | Freq: Once | INTRAVENOUS | Status: AC
Start: 2016-03-09 — End: 2016-03-09
  Administered 2016-03-09: 2 g via INTRAVENOUS
  Filled 2016-03-09: qty 50

## 2016-03-09 MED ORDER — VH VANCOMYCIN 1250 MG IN NS 250 ML (SIMPLE)
1250.0000 mg | Freq: Two times a day (BID) | Status: DC
Start: 2016-03-09 — End: 2016-03-09
  Administered 2016-03-09: 1250 mg via INTRAVENOUS
  Filled 2016-03-09 (×2): qty 250

## 2016-03-09 NOTE — Progress Notes (Signed)
Patient: [name]  Date: 03/09/2016   LOS: 5 Days  Admission Date: 03/04/2016   MRN: [id number]  Attending: Ernestene Kiel, MD     SUBJECTIVE     51 year old male with history of diabetes who has not been taking his insulin presented to the emergency department with worsening left foot pain and erythema found to have osteomyelitis with associated cellulitis. Patient started on broad-spectrum antibiotics.  Podiatry consulted  and did Irrigation and debridement, left foot,Excision of 2nd metatarsal head, left foot on 03/07/2016. We  Consulted Dr.Galbraith from ID.      No  complaint.      MEDICATIONS     Current Facility-Administered Medications   Medication Dose Route Frequency   . aspirin  81 mg Oral Daily   . atorvastatin  10 mg Oral QHS   . famotidine  20 mg Oral Daily   . gabapentin  300 mg Oral TID   . heparin (porcine)  5,000 Units Subcutaneous Q8H   . heparin FLUSH  3-5 mL Intracatheter Daily   . insulin aspart  7 Units Subcutaneous TID AC   . insulin glargine  20 Units Subcutaneous QAM   . insulin glargine  40 Units Subcutaneous QHS   . lactobacillus species  50 Billion CFU Oral Daily   . sodium chloride (PF)  10 mL Intracatheter Daily   . sodium chloride  1,000 mL Intravenous Once in ED   . vancomycin  1,250 mg Intravenous Q12H   . vancomycin therapy placeholder   Does not apply See Admin Instructions             PHYSICAL EXAM     Objective:  I/O last 3 completed shifts:  In: 0051 [P.O.:1730; I.V.:2394]  Out: 1400 [Urine:1400]     Vitals:    03/08/16 1949 03/08/16 2346 03/09/16 0350 03/09/16 0720   BP: 134/69 136/79 140/86 (!) 167/91   Pulse: 90 86 85 83   Resp: 17 16 17 17    Temp: 98.6 F (37 C) 98 F (36.7 C) 97.5 F (36.4 C) 98 F (36.7 C)   TempSrc: Oral Oral Oral Oral   SpO2: 96% 98% 100% 99%   Weight:       Height:         Wt Readings from Last 4 Encounters:   03/05/16 109 kg (240 lb 4.8 oz)   01/21/16 113.4 kg (250 lb)      07/09/15 112 kg (247 lb)   05/04/15 104.3 kg (230 lb)          Exam: Lying comfortably in his bed  HEENT: PERRLA, EOMI  Neck: supple, without JVD  Chest: Fair air entry bilaterally  CVS: S1, S2 regular rate and rhythm  no murmurs    Abdomen: soft and non tender with good bowel sounds.  Musculoskeletal: Left lower extremity edema with erythema and tenderness ulcer on the plantar aspect of the foot with necrotic base pulses palpable  Skin: Warm dry no worrisome lesions  NEURO:  Cranial nerve II-12 intact no motor or sensory deficits  Psychiatric: alert, interactive, appropriate, normal affect      LABS       Recent Labs  Lab 03/09/16  0403 03/08/16  0418 03/07/16  0610 03/05/16  0345 03/04/16  1715   WBC 8.5 8.0 7.0 11.5* 10.8   RBC 3.56* 3.49* 3.79* 3.92* 4.35   Hemoglobin 10.6* 10.1* 11.2* 11.7* 13.0   Hematocrit 32.0* 31.3* 34.2* 34.9* 38.9*   MCV 90 90  90 89 89   PLT CT 220 223 199 177 198       Recent Labs  Lab 03/09/16  0403 03/08/16  0418 03/07/16  0610 03/06/16  1207 03/05/16  0345 03/04/16  2144   Sodium 139 138 137  --  137 132*   Potassium 3.9 3.8 3.7  --  3.1* 4.1   Chloride 106 107 107  --  106 103   CO2 26.1 24.7 23.0  --  22.1 23.5   BUN 11 10 9   --  21 27*   Creatinine 1.07 0.93 0.83 0.80 0.96 1.13   Glucose 120* 131* 219*  --  94 511*   Calcium 8.6 8.4* 8.1*  --  7.8* 7.8*   Magnesium 1.7 1.6  --   --   --   --                Invalid input(s): OSMOL          No results found for: BNP       (!) 107 (The above 1 analytes were performed by [facility] [id number]) [address],[place] [address] )  Invalid input(s): BLOODCULTURE      RADIOLOGY     Xr Foot Left Ap Lateral And Oblique    Result Date: 03/07/2016  Status post osteotomy of the distal second metatarsal. ReadingStation:[email or web address]    Xr Foot Left Ap Lateral And Oblique    Result Date: 03/04/2016  There is resorption of the bone either side of previously seen first left metatarsal osteotomy, concerning for osteomyelitis.  There is also a possible fracture or erosion through the distal neck of the fourth metatarsal bone of the left foot. There is medial subluxation at the second metatarsophalangeal joint of the left foot, possibly neuropathic. There is diffuse left foot soft tissue swelling. ReadingStation:[email or web address]    Mri Foot Left W Wo Contrast    Result Date: 03/05/2016  1. Osteomyelitis of the first metatarsal phalangeal joint, with associated phlegmon that extends into the volar aspect of the first metatarsal head. Clinical correlation is requested for a single linear focus of susceptibility artifact identified within the mid to distal first metatarsal shaft. 2. Osteomyelitis of the second metatarsal which is also plantarly dislocated. 3. Pathologic fracture with osteomyelitis of the third metatarsal head/neck region which is also plantarly subluxed. 4. Comminuted pathologic fracture with osteomyelitis of the fourth metatarsal head. 5. Diffuse myositis. 6. Signal irregularity identified along the lateral base of the calcaneus is of uncertain clinical significance and could represent a focus of osteomyelitis. ReadingStation:[email or web address]        Assessment and Plan:     - left metatarsal osteomyelitis also associated cellulitis     MRI showed osteomyelitis of the first metatarsal joint associated with phlegmon there is also osteomyelitis of the second Metatarsal pathological fracture with osteomyelitis of the third metatarsal head communicated pathological fracture with osteomyelitis of the fourth metatarsal head and diffuse myositis. Podiatry did Irrigation and debridement, left foot,Excision of 2nd metatarsal head, left foot on 03/07/2016.bone biopsy grew Staphylococcus aureus.Sensitivity To Follow  Dr.Galbraith consulted.continue with vancomycin  until we get the sensitivity      - Type II diabetes ; patient is on lantus 20 units in am and 40 units pm.he is also on novolog 7 units before meals when his blood sugar is above 150.    - Acute  kidney injury resolved with aggressive IV hydration    --HTN; restart lisinopril    -  Diabetic neuropathy on gabapentin    - DVT prophylaxis;heparin          - Disposition- He will be discharged when we have bacterial sensitivity.     Ernestene Kiel, MD  03/09/2016 10:48 AM    I can be reached at [phone]    NOTE: This record is partially electronically generated (including the use of voice recognition technology) and may contain additions and omissions unintended by user.

## 2016-03-09 NOTE — Progress Notes (Signed)
Pharmacy Vancomycin Dosing Consult Note  JAIDEEP POLLACK    Assessment:   1. Day # 6 Vancomycin in a 51 YOM who presents with diabetic ulcer on his foot.  Patient stopped taking his insulin.  Blood sugars uncontrolled and patient denies trauma. Over the past 3 weeks patient noticed worsening erythema of the left left now extending to his knees. Podiatry and ID consulted. X-ray of foot concerning for osteomyelitis and MRI confirmed osteo in several places. OR 1/22 for I&D and excisional wound debridement with excision of 2nd metatarsal head.  2. WBC wnl, afebrile, SCr slightly up but wnl.  New abscess culture from L toe collected in OR 1/22 growing staph aureus, awaiting ID and sensitivities before determining abx plan per ID.   3. Vanc trough is supra-therapeutic this am at 20.6 mcg/ml and drug is at steady state.     Plan:   1. Decrease Vancomycin dose to 1250 mg IV every 12 hours.  Next level due 1/26 @ 0900.  2. Pharmacy will follow the patient's renal function, vancomycin levels, and dosing during the course of therapy. If you have any questions, please contact the pharmacist at [phone].     Indication: Osteomyelitis left foot    Age: 51 y.o.  Height: 1.676 m (5\' 6" )  Weight:  109 kg (240 lb 4.8 oz)  IBW: 64 kg  DW: 82 kg    CrCL: 94 ml/min    Drug Levels:  Due 1/26 @ 0900.  Trough level: 9.26 on 1/21 at [place] level: 20.6 on 1/24 at 0700     New Pharmacokinetics:   t50: 8 hrs         Kel: 0.082 hr-1        Vd: 57.3 L         Expected Trough: 13 mg/L                Recent Labs  Lab 03/09/16  0403 03/08/16  0418 03/07/16  0610 03/06/16  1207 03/05/16  0345   Creatinine 1.07 0.93 0.83 0.80 0.96   BUN 11 10 9   --  21       Recent Labs  Lab 03/09/16  0403 03/08/16  0418 03/07/16  0610 03/05/16  0345   WBC 8.5 8.0 7.0 11.5*     Temp (24hrs), Avg:98.1 F (36.7 C), Min:97.5 F (36.4 C), Max:98.6 F (37 C)    Cultures:   Date Source Organism Sensitivities Resistance   1/19 blood NGTD     1/19 Wound L foot  Mixed skin mirobiota - Final     1/22 L toe abscess Staph aureus

## 2016-03-09 NOTE — Progress Notes (Signed)
PODIATRIC SURGERY PROGRESS NOTE   Resident pager [phone]    Date Time: 03/09/16 9:06 AM  Patient Name: [name] Day: 6    Assessment:   51 y.o. 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 uncontrolled DM2 with neuropathy and non compliance with management of diabetes s/p I&D left foot with excision of 2nd metatarsal head    Plan:   - Dressed changed at bedside. Recommend santyl to the anterior left left wound and dry sterile dressings.  - Continue IV antibiotics per ID. Intraoperative culture sensitivities pending  - Heel weightbearing in surgical shoe  - Patient will be stable for discharge from podiatry standpoint pending outpatient antibiotic plan and daily wet to dry dressings to the plantar left foot ulceration and dry sterile dressings to the dorsal incision.  - Will discuss plan with attending, continue to follow    Subjective:   Patient evaluated at bedside in NAD.     Physical Exam:     Vitals:    03/09/16 0720   BP: (!) 167/91   Pulse: 83   Resp: 17   Temp: 98 F (36.7 C)   SpO2: 99%     Left Lower Extremity Exam  Incision well coapted on dorsal left foot. Open ulceration appreciated to plantar foot sub 2nd metatarsal. No gross purulent appreciated. Edema improved. SILT diminished, wiggles toes well. Calf non tender.    Labs:   Labs (last 72 hours):      Recent Labs  Lab 03/09/16  0403 03/08/16  0418   WBC 8.5 8.0   Hemoglobin 10.6* 10.1*   Hematocrit 32.0* 31.3*   PLT CT 220 223            Recent Labs  Lab 03/09/16  0403 03/08/16  0418   Sodium 139 138   Potassium 3.9 3.8   Chloride 106 107   CO2 26.1 24.7   BUN 11 10   Creatinine 1.07 0.93   Calcium 8.6 8.4*   Glucose 120* 131*                 Microbiology:  Microbiology Results     Procedure Component Value Units Date/Time    Abscess Culture and Smear [[id number]] Collected:  03/07/16 1508    Specimen:  Abscess from Left Toe Updated:  03/08/16 0726    Narrative:       Specimen/Source: Abscess/Left  Toe  Collected: 03/07/2016 15:08     Status: Valued      Last Updated: 03/08/2016 07:26           (1) *Specimen Abscess             Gram Stain (Final)      Few WBC's Seen      No Organisms Seen       ISO #1 (Prelim)      Scant Growth      Staphylococcus aureus      Sensitivity To Follow          Anaerobic Culture [[id number]] Collected:  03/07/16 1508    Specimen:  Abscess from Left Toe Updated:  03/08/16 0851    Narrative:       Specimen/Source: Abscess/Left Toe  Collected: 03/07/2016 15:08     Status: Valued      Last Updated: 03/08/2016 08:51                Culture Result (Prelim)      No Anaerobes Isolated -  Day 1, Reincubate          Blood Culture - Venipuncture # 1 [[id number]] Collected:  03/04/16 1715    Specimen:  Blood from Venipuncture Updated:  03/06/16 0754    Narrative:       Specimen/Source: Blood/Venipuncture  Collected: 03/04/2016 17:15     Status: Valued      Last Updated: 03/06/2016 07:53           (1) *Specimen Blood      *Source Detail Venipuncture             Culture Result (Prelim)      No Growth To Date          Influenza A / B Rapid Test [[id number]] Collected:  03/04/16 1715    Specimen:  Nasal Wash Updated:  03/04/16 1741     Influenza A Negative     Influenza B Negative     Comment: Method: Collier Bullock    The sensitivity for this method is between 90% and 95% for Influenza A and around 90% for Influenza B. The specificity for both Influenza A and B is around 96%. False positive results may occur, especially when the prevalence of Influenza activity is low. This is more likely with Influenza B due to its lower prevalence. Clinical conditions, including the prevalence of influenza activity, should be considered in the interpretation of results. If clinically indicated, results may be confirmed with PCR testing.  The above 2 analytes were performed by [facility] [id number])  [address] [address]         Narrative:       Influenza A antigen detection tests  are unable to distinquish between novel and seasonal influenza A.    A negative result for either Influenza A or B antigen does not exclude influenza virus infection. Clinical correlation required.    All positive influenza antigen tests (A or B) require placement of patient on droplet precaution isolation.    Wound Culture and Smear [[id number]] Collected:  03/04/16 1748    Specimen:  Wound from Left Foot Updated:  03/06/16 0701    Narrative:       Specimen/Source: Wound/Left Foot  Collected: 03/04/2016 17:48     Status: Final      Last Updated: 03/06/2016 06:27           (1) *Specimen Wound      *Source Detail Left Foot             Gram Stain (Final)      Many WBC's Seen      Many Gram Positive Cocci       ISO #1 (Final)      Moderate Growth      Mixed skin microbiota                  Radiology:   Results reviewed.  Xr Foot Left Ap Lateral And Oblique    Result Date: 03/07/2016  Status post osteotomy of the distal second metatarsal. ReadingStation:[email or web address]    Xr Foot Left Ap Lateral And Oblique    Result Date: 03/04/2016  There is resorption of the bone either side of previously seen first left metatarsal osteotomy, concerning for osteomyelitis. There is also a possible fracture or erosion through the distal neck of the fourth metatarsal bone of the left foot. There is medial subluxation at the second metatarsophalangeal joint of the left foot, possibly neuropathic. There is diffuse left foot soft tissue swelling.  ReadingStation:[email or web address]    Mri Foot Left W Wo Contrast    Result Date: 03/05/2016  1. Osteomyelitis of the first metatarsal phalangeal joint, with associated phlegmon that extends into the volar aspect of the first metatarsal head. Clinical correlation is requested for a single linear focus of susceptibility artifact identified within the mid to distal first metatarsal shaft. 2. Osteomyelitis of the second metatarsal which is also plantarly dislocated. 3. Pathologic fracture with osteomyelitis of the third  metatarsal head/neck region which is also plantarly subluxed. 4. Comminuted pathologic fracture with osteomyelitis of the fourth metatarsal head. 5. Diffuse myositis. 6. Signal irregularity identified along the lateral base of the calcaneus is of uncertain clinical significance and could represent a focus of osteomyelitis. ReadingStation:[email or web address]        Signed by: Pilar Grammes DPM PGY-2

## 2016-03-09 NOTE — Plan of Care (Signed)
Problem: Compromised Tissue integrity  Goal: Damaged tissue is healing and protected  Outcome: Progressing  Podiatry changed left foot dressing. Nursing to change dressing to left inner leg/calf ulcer with santyl and dry dressing. Continue to monitor. Antibiotics given.

## 2016-03-09 NOTE — Progress Notes (Signed)
Infectious Disease Medication Order for:  Banker,Raynor E  DOB:  1965/04/27,  51 y.o.  MRN:  [id number]  PCP:   Rhunette Croft, MD   Infection Diagnosis MSSA L 2nd met head    Drug 1 of  . Drug 2 of   . Drug 3 of  .   Antibiotic Name cefazolin     Route Intravenous Infusion Intravenous Infusion Intravenous Infusion   Dose & Frequency   2gmevery  8 hours.   every    hours.   every    hours.   Duration of treatment   25days   days   days   Start Date 1/25     Labs Needed for monitoring     Weekly cbc, cmp    Estimated Creatinine Clearance: 93.6 mL/min (based on SCr of 1.07 mg/dL).   PICC line removal  Should PICC line be removed by Home Health after antibiotic therapy is completed? - No   ID Physician will follow the patient outside the hospital? yes   Follow up at  1-2 weks       Hardin Negus, MD  03/09/2016, 4:04 PM    -  By electronically signing my note, I affirm that this is a valid medical order that can be used outside the hospital.

## 2016-03-09 NOTE — UM Notes (Signed)
[facility] Utilization Management Review Sheet    Facility :  [facility]    NAME: [name]  MR#: [id number]    CSN#: [id number]    ROOM: 422/422-A AGE: 51 y.o.    ADMIT DATE AND TIME: 03/04/2016  4:41 PM      PATIENT CLASS: Inpatient     ATTENDING PHYSICIAN: Ernestene Kiel, *  PAYOR: [employer]      AUTH #: [id number]    DIAGNOSIS:     ICD-10-CM    1. Diabetic foot infection E11.69     L08.9        HISTORY:   Past Medical History:   Diagnosis Date   . Complication of anesthesia     last time he was kicking his legs had to wake him up   . Diabetes mellitus    . Diabetic ulcer of left foot 12/26/14   . Disc    . Gastroesophageal reflux disease    . Hyperlipidemia    . Hypertension    . Seasonal allergic rhinitis    . Sleep apnea     not tested   . Type 2 diabetes mellitus, controlled        DATE OF REVIEW: 03/09/2016    VITALS: BP (!) 167/91   Pulse 83   Temp 98 F (36.7 C) (Oral)   Resp 17   Ht 1.676 m (5\' 6" )   Wt 109 kg (240 lb 4.8 oz)   SpO2 99%   BMI 38.79 kg/m     Active Hospital Problems    Diagnosis   . Osteomyelitis     Patient remains in house under inpatient status    MD note from this morning:  "- left metatarsal osteomyelitis also associated cellulitis     MRI showed osteomyelitis of the first metatarsal joint associated with phlegmon there is also osteomyelitis of the second Metatarsal pathological fracture with osteomyelitis of the third metatarsal head communicated pathological fracture with osteomyelitis of the fourth metatarsal head and diffuse myositis. Podiatry did Irrigation and debridement, left foot,Excision of 2nd metatarsal head, left foot on 03/07/2016.bone biopsy grew Staphylococcus aureus.Sensitivity To Follow  Dr.Galbraith consulted.continue with vancomycin  until we get the sensitivity  - Type II diabetes ; patient is on lantus 20 units in am and 40 units pm.he is also on novolog 7 units before meals when his blood sugar is above 150.  - Acute kidney injury  resolved with aggressive IV hydration  --HTN; restart lisinopril  - Diabetic neuropathy on gabapentin  - DVT prophylaxis;heparin  - Disposition- He will be discharged when we have bacterial sensitivity."    Patient had PICC inserted 1/23     Sensitivity still pending on culture results    Zosyn has been d/c, patient remains on Vancomycin    Pain management issues: Norco x5 on 1/23 and x3 so far today    [place]  [facility]  Utilization Review  P [phone]  F [phone]

## 2016-03-09 NOTE — Progress Notes (Addendum)
03/09/16 1613   Case Management Quick Doc   CM Comments 1/24 RNCM: [facility] Financial assistance personnel has met with pt this afternoon. Referrals made to [facility] and [facility] for a SOC at home,  if he is medically stable for [place],  on Thurs 1/25 at 2000.  NCM to f/u on Thurs am      Spoke with pt re: obtaining his other prescriptions and concerns addressed from the [facility] ER re: pt not being able to afford his medications.    Pt states he " now has coverage for prescriptions" but stated there was a time he lost those benefits from his employer.    [place]  Orthopaedic Nurse Case Manager  [phone]

## 2016-03-09 NOTE — Plan of Care (Signed)
Problem: Safety  Goal: Patient will be free from injury during hospitalization  Outcome: Progressing   03/09/16 0502   Goal/Interventions addressed this shift   Patient will be free from injury during hospitalization  Assess patient's risk for falls and implement fall prevention plan of care per policy;Provide and maintain safe environment;Ensure appropriate safety devices are available at the bedside;Assess for patients risk for elopement and implement Elopement Risk Plan per policy;Use appropriate transfer methods       Problem: Moderate/High Fall Risk Score >5  Goal: Patient will remain free of falls  Outcome: Progressing      Comments: Pain was controlled with oral medications.  Dressings are dry and intact.  IV intact  Pt is voiding WNL in the urinal.   Neuro-vascular assessment is intact.  Vital  signs reviewed:    Temp: 98 F (36.7 C), Heart Rate: 86, Resp Rate: 16, BP: 136/79

## 2016-03-10 LAB — CBC AND DIFFERENTIAL
Basophils %: 0.5 % (ref 0.0–3.0)
Basophils Absolute: 0 10*3/uL (ref 0.0–0.3)
Eosinophils %: 2 % (ref 0.0–7.0)
Eosinophils Absolute: 0.2 10*3/uL (ref 0.0–0.8)
Hematocrit: 35.4 % — ABNORMAL LOW (ref 39.0–52.5)
Hemoglobin: 10.8 gm/dL — ABNORMAL LOW (ref 13.0–17.5)
Lymphocytes Absolute: 2.3 10*3/uL (ref 0.6–5.1)
Lymphocytes: 24.4 % (ref 15.0–46.0)
MCH: 28 pg (ref 28–35)
MCHC: 31 gm/dL — ABNORMAL LOW (ref 32–36)
MCV: 91 fL (ref 80–100)
MPV: 7.8 fL (ref 6.0–10.0)
Monocytes Absolute: 0.8 10*3/uL (ref 0.1–1.7)
Monocytes: 8.7 % (ref 3.0–15.0)
Neutrophils %: 64.5 % (ref 42.0–78.0)
Neutrophils Absolute: 6 10*3/uL (ref 1.7–8.6)
PLT CT: 277 10*3/uL (ref 130–440)
RBC: 3.91 10*6/uL — ABNORMAL LOW (ref 4.00–5.70)
RDW: 12.2 % (ref 11.0–14.0)
WBC: 9.3 10*3/uL (ref 4.0–11.0)

## 2016-03-10 LAB — BASIC METABOLIC PANEL
Anion Gap: 11.9 mMol/L (ref 7.0–18.0)
BUN / Creatinine Ratio: 12.5 Ratio (ref 10.0–30.0)
BUN: 12 mg/dL (ref 7–22)
CO2: 26.1 mMol/L (ref 20.0–30.0)
Calcium: 8.7 mg/dL (ref 8.5–10.5)
Chloride: 107 mMol/L (ref 98–110)
Creatinine: 0.96 mg/dL (ref 0.80–1.30)
EGFR: 91 mL/min/{1.73_m2} (ref 60–150)
Glucose: 85 mg/dL (ref 70–99)
Osmolality Calc: 280 mOsm/kg (ref 275–300)
Potassium: 4 mMol/L (ref 3.5–5.3)
Sodium: 141 mMol/L (ref 136–147)

## 2016-03-10 LAB — VH DEXTROSE STICK GLUCOSE
Glucose POCT: 83 mg/dL (ref 70–99)
Glucose POCT: 122 mg/dL — ABNORMAL HIGH (ref 70–99)

## 2016-03-10 MED ORDER — LANTUS SOLOSTAR 100 UNIT/ML SC SOPN
20.0000 [IU] | PEN_INJECTOR | Freq: Every morning | SUBCUTANEOUS | 0 refills | Status: DC
Start: 2016-03-11 — End: 2016-04-22

## 2016-03-10 MED ORDER — LANTUS SOLOSTAR 100 UNIT/ML SC SOPN
40.0000 [IU] | PEN_INJECTOR | Freq: Every evening | SUBCUTANEOUS | 0 refills | Status: DC
Start: 2016-03-10 — End: 2016-04-22

## 2016-03-10 MED ORDER — CEFAZOLIN SODIUM 1 G IJ SOLR
2.0000 g | Freq: Three times a day (TID) | INTRAMUSCULAR | Status: AC
Start: 2016-03-10 — End: 2016-04-04

## 2016-03-10 MED ORDER — HYDROCODONE-ACETAMINOPHEN 5-325 MG PO TABS
1.0000 | ORAL_TABLET | ORAL | 0 refills | Status: DC | PRN
Start: 2016-03-10 — End: 2016-03-15

## 2016-03-10 MED ORDER — COLLAGENASE 250 UNIT/GM EX OINT
TOPICAL_OINTMENT | Freq: Every day | CUTANEOUS | Status: DC
Start: 2016-03-10 — End: 2016-05-23

## 2016-03-10 MED ORDER — LISINOPRIL 20 MG PO TABS
30.0000 mg | ORAL_TABLET | Freq: Every evening | ORAL | 3 refills | Status: DC
Start: 2016-03-10 — End: 2016-04-22

## 2016-03-10 MED ORDER — VH BIO-K PLUS PROBIOTIC 50 BIL CFU CAPSULE
50.0000 | DELAYED_RELEASE_CAPSULE | Freq: Every day | ORAL | 0 refills | Status: AC
Start: 2016-03-11 — End: 2016-04-10

## 2016-03-10 MED ORDER — INSULIN ASPART 100 UNIT/ML SC SOPN
7.0000 [IU] | PEN_INJECTOR | Freq: Three times a day (TID) | SUBCUTANEOUS | 3 refills | Status: DC
Start: 2016-03-10 — End: 2016-04-22

## 2016-03-10 MED ORDER — LANTUS SOLOSTAR 100 UNIT/ML SC SOPN
20.0000 [IU] | PEN_INJECTOR | Freq: Every morning | SUBCUTANEOUS | 0 refills | Status: DC
Start: 2016-03-11 — End: 2016-03-10

## 2016-03-10 NOTE — Plan of Care (Signed)
Problem: Pain  Goal: Pain at adequate level as identified by patient  Outcome: Progressing   03/10/16 0940   Goal/Interventions addressed this shift   Pain at adequate level as identified by patient Identify patient comfort function goal;Assess pain on admission, during daily assessment and/or before any "as needed" intervention(s);Reassess pain within 30-60 minutes of any procedure/intervention, per Pain Assessment, Intervention, Reassessment (AIR) Cycle;Evaluate if patient comfort function goal is met;Evaluate patient's satisfaction with pain management progress;Offer non-pharmacological pain management interventions;Consult/collaborate with Physical Therapy, Occupational Therapy, and/or Speech Therapy       Problem: Compromised Tissue integrity  Goal: Damaged tissue is healing and protected  Outcome: Progressing   03/10/16 0940   Goal/Interventions addressed this shift   Damaged tissue is healing and protected  Monitor/assess Braden scale every shift;Provide wound care per wound care algorithm;Reposition patient every 2 hours and as needed unless able to reposition self;Increase activity as tolerated/progressive mobility;Relieve pressure to bony prominences for patients at moderate and high risk;Avoid shearing injuries;Keep intact skin clean and dry;Use bath wipes, not soap and water, for daily bathing;Use incontinence wipes for cleaning urine, stool and caustic drainage. Foley care as needed;Monitor external devices/tubes for correct placement to prevent pressure, friction and shearing;Encourage use of lotion/moisturizer on skin;Monitor patient's hygiene practices       Problem: Moderate/High Fall Risk Score >5  Goal: Patient will remain free of falls  Outcome: Progressing   03/10/16 0725   OTHER   Moderate Risk (6-13) LOW-Fall Interventions Appropriate for Low Fall Risk;MOD-Initiate Yellow "Fall Risk" magnet communication tool;MOD-(VH Only) Yellow "Fall Risk" signage;MOD-(VH Only) Yellow slippers;MOD-(VH Only)  Apply yellow "Fall Risk" arm band       Comments: Assumed care of pt, assessment complete, pt denies any needs at this time.    Dressings intact, PICC intact.    Reviewed plan of care for the day:   Ambulate with PT, discharge planning   Pain control, pt will use white board to keep track of medication times.   Temp: 97.8 F (36.6 C), Heart Rate: 80, Resp Rate: 17, BP: 150/89       Will continue to monitor on rounds.

## 2016-03-10 NOTE — Progress Notes (Signed)
Pt is being discharged home  Education complete.  PICC line antibiotics were delivered to patient and PICC line instructions were given as well.   Pt is packed and ready for transport, vital signs are stable, dressings dry and intact  Transport arrived and pt has left the floor.  End of care.    Waynette Buttery, RN, 4:15 PM

## 2016-03-10 NOTE — Discharge Summary (Signed)
Patient: [name]  Admission Date: 03/04/2016   DOB: 1965-05-23  Discharge Date: 03/10/2016    MRN: [id number]  Discharge Attending:Curry Dulski Armandina Stammer, MD   Referring Physician: Rhunette Croft, MD  PCP: Rhunette Croft, MD       DISCHARGE SUMMARY     Discharge Information       Discharge Diagnoses:   Active Problems:    Osteomyelitis   Uncontrolled DM    Additional Diagnosis Evaluated and treated During admission;  Hypertension  Consult: ID and podiatry      Discharge Medications:     Medication List      START taking these medications    ceFAZolin 1 g injection  Commonly known as:  ANCEF  Infuse 2 g into the vein every 8 (eight) hours.for 25 days     collagenase ointment  Commonly known as:  SANTYL  Apply topically daily.     HYDROcodone-acetaminophen 5-325 MG per tablet  Commonly known as:  NORCO  Take 1 tablet by mouth every 4 (four) hours as needed for Pain.     lactobacillus species capsule  Commonly known as:  BIO-K PLUS  Take 1 capsule (50 Billion CFU total) by mouth daily.  Start taking on:  03/11/2016        CHANGE how you take these medications    insulin aspart 100 UNIT/ML injection pen  Commonly known as:  NOVOLOG FLEXPEN  Inject 7 Units into the skin 3 (three) times daily before meals.  What changed:  Another medication with the same name was removed. Continue taking this medication, and follow the directions you see here.     * insulin glargine 100 UNIT/ML injection pen  Inject 40 Units into the skin nightly.  What changed:   how much to take   when to take this     * insulin glargine 100 UNIT/ML injection pen  Inject 20 Units into the skin every morning.  Start taking on:  03/11/2016  What changed:  You were already taking a medication with the same name, and this prescription was added. Make sure you understand how and when to take each.        * This list has 2 medication(s) that are the same as other medications prescribed for you. Read the  directions carefully, and ask your doctor or other care provider to review them with you.            CONTINUE taking these medications    aspirin EC 81 MG EC tablet     atorvastatin 10 MG tablet  Commonly known as:  LIPITOR     cyclobenzaprine 10 MG tablet  Commonly known as:  FLEXERIL  Take 1 tablet (10 mg total) by mouth every 8 (eight) hours as needed for Muscle spasms (pain or muscle spasm).     gabapentin 300 MG capsule  Commonly known as:  NEURONTIN  Take 1 capsule (300 mg total) by mouth 3 (three) times daily.     lisinopril 20 MG tablet  Commonly known as:  PRINIVIL,ZESTRIL  Take 1.5 tablets (30 mg total) by mouth nightly.        STOP taking these medications    metFORMIN 1000 MG tablet  Commonly known as:  GLUCOPHAGE     oxymetazoline 0.05 % nasal spray  Commonly known as:  AFRIN           Where to Get Your Medications      You can get these medications  from any pharmacy    Bring a paper prescription for each of these medications   HYDROcodone-acetaminophen 5-325 MG per tablet   insulin aspart 100 UNIT/ML injection pen   insulin glargine 100 UNIT/ML injection pen   insulin glargine 100 UNIT/ML injection pen   lactobacillus species capsule     Information about where to get these medications is not yet available    Ask your nurse or doctor about these medications   ceFAZolin 1 g injection   collagenase ointment             Hospital Course   History of  Hospital Course    [name] is a 51 y.o. male with  Past medical history of diabetes who has not been taking his insulin presented to the emergency department with worsening left foot pain and erythema found to have osteomyelitis with associated cellulitis. MRI showed osteomyelitis of the first metatarsal joint associated with phlegmon there is also osteomyelitis of the second Metatarsal pathological fracture with osteomyelitis of the third metatarsal head communicated pathological fracture with osteomyelitis of the fourth metatarsal head and  diffuse myositis. Podiatry did Irrigation and debridement, left foot,Excision of 2nd metatarsal head, left foot on 03/07/2016.bone biopsy grew Staphylococcus aureus which is sensitive to cefazolin.Dr.Galbraith consulted and advised [place] to treat him with cefazolin 2gm iv 8 hourly for 25 days starting from 03/10/2016.He need to see him with in one week.wound dressing is per orthopedic surgery.  Patient had uncontrolled DM for which we start him on lantus 40 units in the evening and 20 units in the morning.We also started him on novolog 7 units before meal when his blood sugar is above 150mg /dl.    Procedures/Imaging:   XR Foot Left AP Lateral And Oblique   Final Result   Status post osteotomy of the distal second metatarsal.      ReadingStation:[email or web address]      MRI FOOT LEFT W WO CONTRAST   Final Result      1. Osteomyelitis of the first metatarsal phalangeal joint, with associated phlegmon that extends into the volar aspect of the first metatarsal head. Clinical correlation is requested for a single linear focus of susceptibility artifact identified within    the mid to distal first metatarsal shaft.      2. Osteomyelitis of the second metatarsal which is also plantarly dislocated.      3. Pathologic fracture with osteomyelitis of the third metatarsal head/neck region which is also plantarly subluxed.      4. Comminuted pathologic fracture with osteomyelitis of the fourth metatarsal head.      5. Diffuse myositis.      6. Signal irregularity identified along the lateral base of the calcaneus is of uncertain clinical significance and could represent a focus of osteomyelitis.      ReadingStation:[email or web address]      XR FOOT LEFT AP LATERAL AND OBLIQUE   Final Result   There is resorption of the bone either side of previously seen first left metatarsal osteotomy, concerning for osteomyelitis.   There is also a possible fracture or erosion through the distal neck of the fourth metatarsal bone of the left foot.   There is medial  subluxation at the second metatarsophalangeal joint of the left foot, possibly neuropathic.   There is diffuse left foot soft tissue swelling.      ReadingStation:[email or web address]           Treatment Team:   Attending Provider: Ernestene Kiel, MD  Consulting Physician: Luberta Robertson,  Elta Guadeloupe, MD  Consulting Physician: Janora Norlander, DPM         Progress Note/Physical Exam at Discharge       Vitals:    03/09/16 0720 03/09/16 1603 03/09/16 2300 03/10/16 0727   BP: (!) 167/91 156/89 120/69 150/89   Pulse: 83 85 82 80   Resp: 17 16 18 17    Temp: 98 F (36.7 C) 98.2 F (36.8 C) 97.7 F (36.5 C) 97.8 F (36.6 C)   TempSrc: Oral Oral Oral Oral   SpO2: 99% 100% 98% 99%   Weight:       Height:           Exam:   General: Patient is awake, in no acute distress  Eyes - pupils equal and reactive, non icteric sclera.  Mouth - mucous membranes moist, pharynx normal without lesions  Neck: supple, without JVD  Chest: . No crackles or wheezing.  CVS: S1, S2  Well heard normal,  no murmurs or gallops.   Abdomen: soft and non tender with normal bowel sounds.  Musculoskeletal: has dressed wound at his right foot.  NEURO:Alert and oriented x 3. No gross focal motor or sensory neurological deficits  Skin: No rashes or echymosis.       Diagnostics     Labs/Studies Pending at Discharge: No    Last Labs     Recent Labs  Lab 03/10/16  0529 03/09/16  0403 03/08/16  0418   WBC 9.3 8.5 8.0   RBC 3.91* 3.56* 3.49*   Hemoglobin 10.8* 10.6* 10.1*   Hematocrit 35.4* 32.0* 31.3*   MCV 91 90 90   PLT CT 277 220 223         Recent Labs  Lab 03/10/16  0529 03/09/16  0403 03/08/16  0418 03/07/16  0610 03/06/16  1207 03/05/16  0345   Sodium 141 139 138 137  --  137   Potassium 4.0 3.9 3.8 3.7  --  3.1*   Chloride 107 106 107 107  --  106   CO2 26.1 26.1 24.7 23.0  --  22.1   BUN 12 11 10 9   --  21   Creatinine 0.96 1.07 0.93 0.83 0.80 0.96   Glucose 85 120* 131* 219*  --  94   Calcium 8.7 8.6 8.4* 8.1*  --  7.8*   Magnesium  --  1.7 1.6  --   --   --         Microbiology Results     Procedure Component Value Units Date/Time    Abscess Culture and Smear [[id number]] Collected:  03/07/16 1508    Specimen:  Abscess from Left Toe Updated:  03/10/16 1540    Narrative:       Specimen/Source: Abscess/Left Toe  Collected: 03/07/2016 15:08     Status: Final      Last Updated: 03/10/2016 07:22           (1) *Specimen Abscess             Gram Stain (Final)      Few WBC's Seen      No Organisms Seen       ISO #1 (Final)      Scant Growth      Staphylococcus aureus      Sensitivity To Follow      If Nafcillin sensitive, Cefazolin sensitive.  ISO #1                            Staphylococcus aureus                            ---------------------    MIC (mcg/ml)      Benzylpenicillin (P)  0.25             R      Clindamycin (CD)      <=0.25           S      Erythromycin (E)      <=0.25           S      Nafcillin (OX)        <=0.25           S      Tetracycline (TE)     <=1              S      Trimeth/Sulfa (T/S)   <=10             S      Vancomycin (V)        1                S          Anaerobic Culture [[id number]] Collected:  03/07/16 1508    Specimen:  Abscess from Left Toe Updated:  03/10/16 0849    Narrative:       Specimen/Source: Abscess/Left Toe  Collected: 03/07/2016 15:08     Status: Valued      Last Updated: 03/10/2016 08:49                Culture Result (Prelim)      No Anaerobes Isolated - Day 3, Reincubate          Blood Culture - Venipuncture # 1 [[id number]] Collected:  03/04/16 1715    Specimen:  Blood from Venipuncture Updated:  03/09/16 2109    Narrative:       Specimen/Source: Blood/Venipuncture  Collected: 03/04/2016 17:15     Status: Final      Last Updated: 03/09/2016 21:06           (1) *Specimen Blood      *Source Detail Venipuncture             Culture Result (Final)      No Growth in 5 Days          Influenza A / B Rapid Test [[id number]] Collected:  03/04/16 1715    Specimen:  Nasal Wash Updated:  03/04/16 1741     Influenza  A Negative     Influenza B Negative     Comment: Method: Collier Bullock    The sensitivity for this method is between 90% and 95% for Influenza A and around 90% for Influenza B. The specificity for both Influenza A and B is around 96%. False positive results may occur, especially when the prevalence of Influenza activity is low. This is more likely with Influenza B due to its lower prevalence. Clinical conditions, including the prevalence of influenza activity, should be considered in the interpretation of results. If clinically indicated, results may be confirmed with PCR testing.  The above 2 analytes were performed by [facility] [phone])  [address],[place] [address]  Narrative:       Influenza A antigen detection tests are unable to distinquish between novel and seasonal influenza A.    A negative result for either Influenza A or B antigen does not exclude influenza virus infection. Clinical correlation required.    All positive influenza antigen tests (A or B) require placement of patient on droplet precaution isolation.    Wound Culture and Smear [[id number]] Collected:  03/04/16 1748    Specimen:  Wound from Left Foot Updated:  03/06/16 0701    Narrative:       Specimen/Source: Wound/Left Foot  Collected: 03/04/2016 17:48     Status: Final      Last Updated: 03/06/2016 06:27           (1) *Specimen Wound      *Source Detail Left Foot             Gram Stain (Final)      Many WBC's Seen      Many Gram Positive Cocci       ISO #1 (Final)      Moderate Growth      Mixed skin microbiota                 Patient Instructions   Discharge Diet: regular diet, cardiac diet and diabetic diet  Discharge Activity:  activity as tolerated    Follow Up Appointment:  Follow-up Information     Hardin Negus, MD Follow up.    Specialties:  Infectious Disease, Internal Medicine  Why:  1-2 weeks.  FEB. 2 @ 2:30  Contact information:  [place] [address]  [phone]              Rhunette Croft, MD Follow up in 5 day(s).    Specialty:  Family Medicine  Contact information:  [place] [address]  [phone]                    Time spent examining patient, discussing with patient/family regarding hospital course, chart review, reconciling medications and discharge planning: 45 minutes.      Ernestene Kiel, MD  10:05 AM 03/10/2016     Cc: PCP

## 2016-03-10 NOTE — Plan of Care (Signed)
Problem: Moderate/High Fall Risk Score >5  Goal: Patient will remain free of falls  Outcome: Progressing   03/10/16 0532   OTHER   Moderate Risk (6-13) LOW-Fall Interventions Appropriate for Low Fall Risk;MOD-(VH Only) Yellow "Fall Risk" signage;MOD-(VH Only) Yellow slippers;MOD-(VH Only) Apply yellow "Fall Risk" arm band;MOD-Consider activation of bed alarm if appropriate;MOD-Apply bed exit alarm if patient is confused;MOD-(VH Only) Place "Reset Bed Alarm" sign above bed if in use;MOD-Use of chair-pad alarm when appropriate;MOD-Include family in multidisciplinary POC discussions;MOD-Place Fall Risk level on whiteboard in room;MOD-(VH Only) Consider use of "STOP Call For Help" sign;MOD-Request PT/OT consult order for patients with gait/mobility impairment       Comments: Pt rested and walked in room  Pain was controlled with oral medications.  Dressings are dry and intact.  IV intact  Pt is voiding WNL in the bathroom  Neuro-vascular assessment is intact.  Vital  signs reviewed:    Temp: 97.7 F (36.5 C), Heart Rate: 82, Resp Rate: 18, BP: 120/69    Report to be given to accepting RN     End of shift.

## 2016-03-11 ENCOUNTER — Other Ambulatory Visit (RURAL_HEALTH_CENTER): Payer: Self-pay

## 2016-03-11 ENCOUNTER — Other Ambulatory Visit (INDEPENDENT_AMBULATORY_CARE_PROVIDER_SITE_OTHER): Payer: Self-pay | Admitting: Family Medicine

## 2016-03-11 DIAGNOSIS — E1140 Type 2 diabetes mellitus with diabetic neuropathy, unspecified: Secondary | ICD-10-CM

## 2016-03-11 MED ORDER — ATORVASTATIN CALCIUM 10 MG PO TABS
10.0000 mg | ORAL_TABLET | Freq: Every evening | ORAL | 3 refills | Status: DC
Start: 2016-03-11 — End: 2016-05-09

## 2016-03-11 NOTE — UM Notes (Signed)
Discharge summary for [name]  November 20, 1965  Auth# [id number]                                   Patient: [name]  Admission Date: 03/04/2016   DOB: 28-May-1965  Discharge Date: 03/10/2016    MRN: [id number]  Discharge Attending:Dereje Armandina Stammer, MD   Referring Physician: Rhunette Croft, MD  PCP: Rhunette Croft, MD       DISCHARGE SUMMARY     Discharge Information       Discharge Diagnoses:   Active Problems:    Osteomyelitis   Uncontrolled DM    Additional Diagnosis Evaluated and treated During admission;  Hypertension  Consult: ID and podiatry      Discharge Medications:     Medication List      START taking these medications    ceFAZolin 1 g injection  Commonly known as:  ANCEF  Infuse 2 g into the vein every 8 (eight) hours.for 25 days     collagenase ointment  Commonly known as:  SANTYL  Apply topically daily.     HYDROcodone-acetaminophen 5-325 MG per tablet  Commonly known as:  NORCO  Take 1 tablet by mouth every 4 (four) hours as needed for Pain.     lactobacillus species capsule  Commonly known as:  BIO-K PLUS  Take 1 capsule (50 Billion CFU total) by mouth daily.  Start taking on:  03/11/2016        CHANGE how you take these medications    insulin aspart 100 UNIT/ML injection pen  Commonly known as:  NOVOLOG FLEXPEN  Inject 7 Units into the skin 3 (three) times daily before meals.  What changed:  Another medication with the same name was removed. Continue taking this medication, and follow the directions you see here.     * insulin glargine 100 UNIT/ML injection pen  Inject 40 Units into the skin nightly.  What changed:   how much to take   when to take this     * insulin glargine 100 UNIT/ML injection pen  Inject 20 Units into the skin every morning.  Start taking on:  03/11/2016  What changed:  You were already taking a medication with the same name, and this prescription was added. Make sure you understand how and when to take each.        * This list has 2  medication(s) that are the same as other medications prescribed for you. Read the directions carefully, and ask your doctor or other care provider to review them with you.            CONTINUE taking these medications    aspirin EC 81 MG EC tablet     atorvastatin 10 MG tablet  Commonly known as:  LIPITOR     cyclobenzaprine 10 MG tablet  Commonly known as:  FLEXERIL  Take 1 tablet (10 mg total) by mouth every 8 (eight) hours as needed for Muscle spasms (pain or muscle spasm).     gabapentin 300 MG capsule  Commonly known as:  NEURONTIN  Take 1 capsule (300 mg total) by mouth 3 (three) times daily.     lisinopril 20 MG tablet  Commonly known as:  PRINIVIL,ZESTRIL  Take 1.5 tablets (30 mg total) by mouth nightly.        STOP taking these medications    metFORMIN 1000 MG tablet  Commonly  known as:  GLUCOPHAGE     oxymetazoline 0.05 % nasal spray  Commonly known as:  AFRIN           Where to Get Your Medications      You can get these medications from any pharmacy    Bring a paper prescription for each of these medications   HYDROcodone-acetaminophen 5-325 MG per tablet   insulin aspart 100 UNIT/ML injection pen   insulin glargine 100 UNIT/ML injection pen   insulin glargine 100 UNIT/ML injection pen   lactobacillus species capsule     Information about where to get these medications is not yet available    Ask your nurse or doctor about these medications   ceFAZolin 1 g injection   collagenase ointment             Hospital Course   History of  Hospital Course    [name] is a 51 y.o. male with  Past medical history of diabetes who has not been taking his insulin presented to the emergency department with worsening left foot pain and erythema found to have osteomyelitis with associated cellulitis. MRI showed osteomyelitis of the first metatarsal joint associated with phlegmon there is also osteomyelitis of the second Metatarsal pathological fracture with osteomyelitis of the third  metatarsal head communicated pathological fracture with osteomyelitis of the fourth metatarsal head and diffuse myositis. Podiatry did Irrigation and debridement, left foot,Excision of 2nd metatarsal head, left foot on 03/07/2016.bone biopsy grew Staphylococcus aureus which is sensitive to cefazolin.Dr.Galbraith consulted and advised [place] to treat him with cefazolin 2gm iv 8 hourly for 25 days starting from 03/10/2016.He need to see him with in one week.wound dressing is per orthopedic surgery.  Patient had uncontrolled DM for which we start him on lantus 40 units in the evening and 20 units in the morning.We also started him on novolog 7 units before meal when his blood sugar is above 150mg /dl.    Procedures/Imaging:   XR Foot Left AP Lateral And Oblique   Final Result   Status post osteotomy of the distal second metatarsal.      ReadingStation:[id number]      MRI FOOT LEFT W WO CONTRAST   Final Result      1. Osteomyelitis of the first metatarsal phalangeal joint, with associated phlegmon that extends into the volar aspect of the first metatarsal head. Clinical correlation is requested for a single linear focus of susceptibility artifact identified within    the mid to distal first metatarsal shaft.      2. Osteomyelitis of the second metatarsal which is also plantarly dislocated.      3. Pathologic fracture with osteomyelitis of the third metatarsal head/neck region which is also plantarly subluxed.      4. Comminuted pathologic fracture with osteomyelitis of the fourth metatarsal head.      5. Diffuse myositis.      6. Signal irregularity identified along the lateral base of the calcaneus is of uncertain clinical significance and could represent a focus of osteomyelitis.      ReadingStation:[id number]      XR FOOT LEFT AP LATERAL AND OBLIQUE   Final Result   There is resorption of the bone either side of previously seen first left metatarsal osteotomy, concerning for osteomyelitis.   There is also a possible  fracture or erosion through the distal neck of the fourth metatarsal bone of the left foot.   There is medial subluxation at the second metatarsophalangeal joint of the  left foot, possibly neuropathic.   There is diffuse left foot soft tissue swelling.      ReadingStation:[email or web address]           Treatment Team:   Attending Provider: Ernestene Kiel, MD  Consulting Physician: Hardin Negus, MD  Consulting Physician: Janora Norlander, DPM         Progress Note/Physical Exam at Discharge       Vitals          Vitals:    03/09/16 0720 03/09/16 1603 03/09/16 2300 03/10/16 0727   BP: (!) 167/91 156/89 120/69 150/89   Pulse: 83 85 82 80   Resp: 17 16 18 17    Temp: 98 F (36.7 C) 98.2 F (36.8 C) 97.7 F (36.5 C) 97.8 F (36.6 C)   TempSrc: Oral Oral Oral Oral   SpO2: 99% 100% 98% 99%   Weight:       Height:              Exam:   General: Patient is awake, in no acute distress  Eyes - pupils equal and reactive, non icteric sclera.  Mouth - mucous membranes moist, pharynx normal without lesions  Neck: supple, without JVD  Chest: . No crackles or wheezing.  CVS: S1, S2  Well heard normal,  no murmurs or gallops.   Abdomen: soft and non tender with normal bowel sounds.  Musculoskeletal: has dressed wound at his right foot.  NEURO:Alert and oriented x 3. No gross focal motor or sensory neurological deficits  Skin: No rashes or echymosis.       Diagnostics     Labs/Studies Pending at Discharge: No    Last Labs     Recent Labs  Lab 03/10/16  0529 03/09/16  0403 03/08/16  0418   WBC 9.3 8.5 8.0   RBC 3.91* 3.56* 3.49*   Hemoglobin 10.8* 10.6* 10.1*   Hematocrit 35.4* 32.0* 31.3*   MCV 91 90 90   PLT CT 277 220 223         Recent Labs  Lab 03/10/16  0529 03/09/16  0403 03/08/16  0418 03/07/16  0610 03/06/16  1207 03/05/16  0345   Sodium 141 139 138 137  --  137   Potassium 4.0 3.9 3.8 3.7  --  3.1*   Chloride 107 106 107 107  --  106   CO2 26.1 26.1 24.7 23.0  --  22.1   BUN 12 11 10 9   --  21     Creatinine 0.96 1.07 0.93 0.83 0.80 0.96   Glucose 85 120* 131* 219*  --  94   Calcium 8.7 8.6 8.4* 8.1*  --  7.8*   Magnesium  --  1.7 1.6  --   --   --                   Microbiology Results                Procedure Component Value Units Date/Time    Abscess Culture and Smear [[id number]] Collected:  03/07/16 1508    Specimen:  Abscess from Left Toe Updated:  03/10/16 4709    Narrative:       Specimen/Source: Abscess/Left Toe  Collected: 03/07/2016 15:08     Status: Final      Last Updated: 03/10/2016 07:22           (1) *Specimen Abscess             Gram  Stain (Final)      Few WBC's Seen      No Organisms Seen       ISO #1 (Final)      Scant Growth      Staphylococcus aureus      Sensitivity To Follow      If Nafcillin sensitive, Cefazolin sensitive.                               ISO #1                            Staphylococcus aureus                            ---------------------    MIC (mcg/ml)      Benzylpenicillin (P)  0.25             R      Clindamycin (CD)      <=0.25           S      Erythromycin (E)      <=0.25           S      Nafcillin (OX)        <=0.25           S      Tetracycline (TE)     <=1              S      Trimeth/Sulfa (T/S)   <=10             S      Vancomycin (V)        1                S          Anaerobic Culture [[id number]] Collected:  03/07/16 1508    Specimen:  Abscess from Left Toe Updated:  03/10/16 0849    Narrative:       Specimen/Source: Abscess/Left Toe  Collected: 03/07/2016 15:08     Status: Valued      Last Updated: 03/10/2016 08:49                Culture Result (Prelim)      No Anaerobes Isolated - Day 3, Reincubate          Blood Culture - Venipuncture # 1 [[id number]] Collected:  03/04/16 1715    Specimen:  Blood from Venipuncture Updated:  03/09/16 2109    Narrative:       Specimen/Source: Blood/Venipuncture  Collected: 03/04/2016 17:15     Status: Final      Last Updated: 03/09/2016 21:06           (1) *Specimen Blood      *Source Detail Venipuncture              Culture Result (Final)      No Growth in 5 Days          Influenza A / B Rapid Test [[id number]] Collected:  03/04/16 1715    Specimen:  Nasal Wash Updated:  03/04/16 1741     Influenza A Negative     Influenza B Negative     Comment: Method: Collier Bullock    The sensitivity for this method is between 90% and 95% for Influenza A and  around 90% for Influenza B. The specificity for both Influenza A and B is around 96%. False positive results may occur, especially when the prevalence of Influenza activity is low. This is more likely with Influenza B due to its lower prevalence. Clinical conditions, including the prevalence of influenza activity, should be considered in the interpretation of results. If clinically indicated, results may be confirmed with PCR testing.  The above 2 analytes were performed by [facility] [phone])  [address] [address]         Narrative:       Influenza A antigen detection tests are unable to distinquish between novel and seasonal influenza A.    A negative result for either Influenza A or B antigen does not exclude influenza virus infection. Clinical correlation required.    All positive influenza antigen tests (A or B) require placement of patient on droplet precaution isolation.    Wound Culture and Smear [[id number]] Collected:  03/04/16 1748    Specimen:  Wound from Left Foot Updated:  03/06/16 0701    Narrative:       Specimen/Source: Wound/Left Foot  Collected: 03/04/2016 17:48     Status: Final      Last Updated: 03/06/2016 06:27           (1) *Specimen Wound      *Source Detail Left Foot             Gram Stain (Final)      Many WBC's Seen      Many Gram Positive Cocci       ISO #1 (Final)      Moderate Growth      Mixed skin microbiota                 Patient Instructions   Discharge Diet: regular diet, cardiac diet and diabetic diet  Discharge Activity:  activity as tolerated    Follow Up Appointment:       Follow-up Information     Hardin Negus, MD Follow up.    Specialties:  Infectious Disease, Internal Medicine  Why:  1-2 weeks.  FEB. 2 @ 2:30  Contact information:  [place] [address]  [phone]             Rhunette Croft, MD Follow up in 5 day(s).    Specialty:  Family Medicine  Contact information:  [place] [address]  [facility]  Utilization Review  P [phone]  F [phone]

## 2016-03-14 ENCOUNTER — Other Ambulatory Visit
Admission: RE | Admit: 2016-03-14 | Discharge: 2016-03-14 | Disposition: A | Discharge status: Home / Self Care | Payer: BC Managed Care – PPO | Source: Ambulatory Visit | Attending: Foot & Ankle Surgery | Admitting: Foot & Ankle Surgery

## 2016-03-14 DIAGNOSIS — M869 Osteomyelitis, unspecified: Secondary | ICD-10-CM | POA: Insufficient documentation

## 2016-03-14 LAB — CBC AND DIFFERENTIAL
Basophils %: 0.6 % (ref 0.0–3.0)
Basophils Absolute: 0.1 10*3/uL (ref 0.0–0.3)
Eosinophils %: 1.4 % (ref 0.0–7.0)
Eosinophils Absolute: 0.1 10*3/uL (ref 0.0–0.8)
Hematocrit: 38 % — ABNORMAL LOW (ref 39.0–52.5)
Hemoglobin: 12.4 gm/dL — ABNORMAL LOW (ref 13.0–17.5)
Lymphocytes Absolute: 2 10*3/uL (ref 0.6–5.1)
Lymphocytes: 19.8 % (ref 15.0–46.0)
MCH: 29 pg (ref 28–35)
MCHC: 33 gm/dL (ref 31–36)
MCV: 88 fL (ref 80–100)
MPV: 6 fL (ref 6.0–10.0)
Monocytes Absolute: 0.5 10*3/uL (ref 0.1–1.7)
Monocytes: 5.2 % (ref 3.0–15.0)
Neutrophils %: 73 % (ref 42.0–78.0)
Neutrophils Absolute: 7.3 10*3/uL (ref 1.7–8.6)
PLT CT: 316 10*3/uL (ref 130–440)
RBC: 4.31 10*6/uL (ref 4.00–5.70)
RDW: 12.9 % (ref 10.5–14.5)
WBC: 10 10*3/uL (ref 4.00–11.00)

## 2016-03-14 LAB — COMPREHENSIVE METABOLIC PANEL
ALT: 19 U/L (ref 0–55)
AST (SGOT): 21 U/L (ref 10–42)
Albumin/Globulin Ratio: 0.53 Ratio — ABNORMAL LOW (ref 0.70–1.50)
Albumin: 2.4 gm/dL — ABNORMAL LOW (ref 3.5–5.0)
Alkaline Phosphatase: 111 U/L (ref 40–145)
Anion Gap: 12.8 mMol/L (ref 7.0–18.0)
BUN / Creatinine Ratio: 14.4 Ratio (ref 10.0–30.0)
BUN: 14 mg/dL (ref 7–22)
Bilirubin, Total: 0.2 mg/dL (ref 0.1–1.2)
CO2: 28.4 mMol/L (ref 20.0–30.0)
Calcium: 9.8 mg/dL (ref 8.5–10.5)
Chloride: 104 mMol/L (ref 98–110)
Creatinine: 0.97 mg/dL (ref 0.80–1.30)
EGFR: 89 mL/min/{1.73_m2} (ref 60–150)
Globulin: 4.5 gm/dL — ABNORMAL HIGH (ref 2.0–4.0)
Glucose: 135 mg/dL — ABNORMAL HIGH (ref 70–99)
Osmolality Calc: 282 mOsm/kg (ref 275–300)
Potassium: 4.5 mMol/L (ref 3.5–5.3)
Protein, Total: 6.8 gm/dL (ref 6.0–8.3)
Sodium: 140 mMol/L (ref 136–147)

## 2016-03-15 ENCOUNTER — Ambulatory Visit: Payer: BC Managed Care – PPO | Attending: Family Medicine | Admitting: Family Medicine

## 2016-03-15 ENCOUNTER — Encounter (RURAL_HEALTH_CENTER): Payer: Self-pay | Admitting: Family Medicine

## 2016-03-15 ENCOUNTER — Inpatient Hospital Stay (RURAL_HEALTH_CENTER): Payer: BC Managed Care – PPO | Admitting: Family Medicine

## 2016-03-15 VITALS — BP 160/96 | HR 95 | Temp 97.6°F | Resp 16 | Ht 66.0 in | Wt 228.0 lb

## 2016-03-15 DIAGNOSIS — E11621 Type 2 diabetes mellitus with foot ulcer: Secondary | ICD-10-CM

## 2016-03-15 DIAGNOSIS — Z794 Long term (current) use of insulin: Secondary | ICD-10-CM

## 2016-03-15 DIAGNOSIS — I10 Essential (primary) hypertension: Secondary | ICD-10-CM

## 2016-03-15 DIAGNOSIS — L97526 Non-pressure chronic ulcer of other part of left foot with bone involvement without evidence of necrosis: Secondary | ICD-10-CM

## 2016-03-15 DIAGNOSIS — E1140 Type 2 diabetes mellitus with diabetic neuropathy, unspecified: Secondary | ICD-10-CM

## 2016-03-15 MED ORDER — GABAPENTIN 300 MG PO CAPS
300.0000 mg | ORAL_CAPSULE | Freq: Three times a day (TID) | ORAL | 3 refills | Status: DC
Start: 2016-03-15 — End: 2017-05-12

## 2016-03-15 NOTE — Patient Instructions (Signed)
1.  Keep your appointments with Dr. Evette Doffing and with infectious disease.  I would like to see you back in about 1 months.  2.  Make sure that you're carrying some that the glucose gel or tablets with you.  She can get those over at [facility] for a Dollar.  3.  we will continue your insulin at the present dose.  If you are below 100 and then hold off on your NovoLog.  4.  If you ever run out of insurance or money to pay for your medications again please let us know as we can look for an alternative resource of which we have many

## 2016-03-15 NOTE — Progress Notes (Signed)
Subjective:    Patient ID: [name] is a 51 y.o. male.    HPI:  1.  Hospital follow-up-the patient was hospitalized at [facility] with an infection in the left foot and osteomyelitis of the foot.  He was found to be hyperglycemic and symptomatic from that because he had not been taking his insulin.  He had run out of insurance and ran out of money.  They also inserted a PICC line.  He was seen by infectious disease and is on Ancef which will be continued until February 18.  The left foot is still wrapped with a postop shoe.  He says that it has mostly dried up and only has a minimal amount of bloody drainage from the wound he has sutures where a part of the bone was excised.  Normally he has some numbness in his feet but prior to his hospitalization he started the foot started hurting quite badly.  Differential infection was underneath a callus of the MCP.  He is feeling well now.  He has no fever or chills now.  He will see infectious disease on Friday February 2 and he will see Dr. Glean Salvo tomorrow.  He is receiving home health services with the [facility] health.  2.  Diabetic neuropathy-the patient does need a refill on his gabapentin which does help control his symptoms.  3.  Diabetes mellitus-as noted above on the patient had been off of his insulin and diabetic medications because he had no insurance and no money to obtain supplies and medications.  On discharge they put him on Lantus 20 units in the morning and 40 units in the evening.  He's also taking NovoLog 7 units which is on a sliding scale before each meal.  If his blood sugars low he does not take the medication.  He has had readings down as low as 70-80.  He was given an insulin drip while he was in the hospital.  He denies blurred vision.  He states that he understands that to get this to heal that we need to have control of his diabetes        The following portions of the patient's history were reviewed and updated as  appropriate: allergies, current medications, past family history, past medical history, past social history, past surgical history and problem list.  Patient Active Problem List   Diagnosis   . Type 2 diabetes mellitus with diabetic neuropathy   . Carpal tunnel syndrome of right wrist   . Nicotine abuse   . Diabetic ulcer of left foot associated with type 2 diabetes mellitus   . Acute renal failure   . Diabetes mellitus with foot ulcer   . HTN (hypertension)   . AKI (acute kidney injury)   . Hyperlipemia   . Diabetic foot ulcer   . Microalbuminuria   . Type 2 diabetes mellitus with diabetic polyneuropathy, with long-term current use of insulin   . Osteomyelitis of left foot, unspecified chronicity   . Osteomyelitis     Current Outpatient Prescriptions   Medication Sig Dispense Refill   . aspirin EC 81 MG EC tablet Take 81 mg by mouth every evening.     Marland Kitchen atorvastatin (LIPITOR) 10 MG tablet Take 1 tablet (10 mg total) by mouth nightly. 90 tablet 3   . ceFAZolin (ANCEF) 1 g injection Infuse 2 g into the vein every 8 (eight) hours.for 25 days     . collagenase (SANTYL) ointment Apply topically daily.     Marland Kitchen  cyclobenzaprine (FLEXERIL) 10 MG tablet Take 1 tablet (10 mg total) by mouth every 8 (eight) hours as needed for Muscle spasms (pain or muscle spasm). 20 tablet 0   . gabapentin (NEURONTIN) 300 MG capsule Take 1 capsule (300 mg total) by mouth 3 (three) times daily. 270 capsule 3   . insulin aspart (NOVOLOG FLEXPEN) 100 UNIT/ML injection pen Inject 7 Units into the skin 3 (three) times daily before meals. 6 pen 3   . insulin glargine (LANTUS SOLOSTAR) 100 UNIT/ML injection pen Inject 40 Units into the skin nightly. 15 mL 0   . insulin glargine (LANTUS SOLOSTAR) 100 UNIT/ML injection pen Inject 20 Units into the skin every morning. 15 mL 0   . lactobacillus species (BIO-K PLUS) capsule Take 1 capsule (50 Billion CFU total) by mouth daily. 30 capsule 0   . lisinopril (PRINIVIL,ZESTRIL) 20 MG tablet Take 1.5 tablets (30  mg total) by mouth nightly. 135 tablet 3     No current facility-administered medications for this visit.      Flu virus vaccine   Review of Systems 14 System Review negative except as noted in the history of present illness          Objective:    Physical Exam   Constitutional: He is oriented to person, place, and time. He appears well-developed and well-nourished. No distress.   HENT:   Head: Normocephalic and atraumatic.   Mouth/Throat: Oropharynx is clear and moist.   Eyes: Conjunctivae are normal. Right eye exhibits no discharge. Left eye exhibits no discharge. No scleral icterus.   Neck: Neck supple. No tracheal deviation present. No thyromegaly present.   Cardiovascular: Normal rate, regular rhythm and normal heart sounds.  Exam reveals no gallop and no friction rub.    No murmur heard.  Pulmonary/Chest: Effort normal and breath sounds normal. No respiratory distress. He has no wheezes.   Abdominal: Soft. Bowel sounds are normal. He exhibits no distension and no mass. There is no tenderness. There is no rebound and no guarding.   Musculoskeletal: He exhibits no edema or deformity.   The patient has a PICC line to the right antecubital fossa without any signs of inflammation   Lymphadenopathy:     He has no cervical adenopathy.   Neurological: He is alert and oriented to person, place, and time.   Skin: Skin is warm and dry. Capillary refill takes less than 2 seconds. No rash noted. He is not diaphoretic. No erythema. No pallor.   Dressing intact to the left foot evaluation deferred to dressing was not removed.  He also has a dressing to the left medial lower leg from a blister that he is using Santyl on   Psychiatric: He has a normal mood and affect. His behavior is normal. Judgment and thought content normal.   Vitals reviewed.    Visit Vitals  BP (!) 160/96   Pulse 95   Temp 97.6 F (36.4 C) (Tympanic)   Resp 16   Ht 1.676 m (5\' 6" )   Wt 103.4 kg (228 lb)   SpO2 96%   BMI 36.80 kg/m         Assessment:      1. Type 2 diabetes mellitus with diabetic neuropathy, with long-term current use of insulin    2. Essential hypertension    3. Diabetic ulcer of left foot associated with type 2 diabetes mellitus, with bone involvement without evidence of necrosis, unspecified part of foot  Plan:   No orders of the defined types were placed in this encounter.       Requested Prescriptions      No prescriptions requested or ordered in this encounter       Follow-up in one month    Patient Instructions   1.  Keep your appointments with Dr. Evette Doffing and with infectious disease.  I would like to see you back in about 1 months.  2.  Make sure that you're carrying some that the glucose gel or tablets with you.  She can get those over at [facility] for a Dollar.  3.  we will continue your insulin at the present dose.  If you are below 100 and then hold off on your NovoLog.  4.  If you ever run out of insurance or money to pay for your medications again please let us know as we can look for an alternative resource of which we have many        Patient was counseled on possible medicine side effects which may include rash, swelling and/or stomach upset.  Patient was instructed to notify me or the ER if they experience problems.    Discussed with patient all diagnostics and consults ordered and addressed patient concerns prior to the completion of the visit.  Patient will be notified of results when they become available.  Encouraged to call for results if no contact within 14 days.     The patient's electronic medical record was reviewed, any changes in the past medical history, past surgical history, medications, diagnostic tests were noted, and the record was updated accordingly.    This note was completed using Visual merchandiser. Grammatical errors, random word insertions, pronoun errors and incomplete sentences are therefore possible due to software limitations. If you have questions or concerns  regarding these errors or this dictation, please address them with the provider for clarification.     Discussed option available for my chart which provides electronic access to diagnostic results.        Signed by: Rhunette Croft, MD

## 2016-03-21 ENCOUNTER — Other Ambulatory Visit
Admission: RE | Admit: 2016-03-21 | Discharge: 2016-03-21 | Disposition: A | Discharge status: Home / Self Care | Payer: BC Managed Care – PPO | Source: Ambulatory Visit | Attending: Foot & Ankle Surgery | Admitting: Foot & Ankle Surgery

## 2016-03-21 DIAGNOSIS — E11621 Type 2 diabetes mellitus with foot ulcer: Secondary | ICD-10-CM | POA: Insufficient documentation

## 2016-03-21 DIAGNOSIS — M868X7 Other osteomyelitis, ankle and foot: Secondary | ICD-10-CM | POA: Insufficient documentation

## 2016-03-21 LAB — COMPREHENSIVE METABOLIC PANEL
ALT: 16 U/L (ref 0–55)
AST (SGOT): 19 U/L (ref 10–42)
Albumin/Globulin Ratio: 0.7 Ratio (ref 0.70–1.50)
Albumin: 2.7 gm/dL — ABNORMAL LOW (ref 3.5–5.0)
Alkaline Phosphatase: 76 U/L (ref 40–145)
Anion Gap: 11.5 mMol/L (ref 7.0–18.0)
BUN / Creatinine Ratio: 25.3 Ratio (ref 10.0–30.0)
BUN: 25 mg/dL — ABNORMAL HIGH (ref 7–22)
Bilirubin, Total: 0.2 mg/dL (ref 0.1–1.2)
CO2: 29.6 mMol/L (ref 20.0–30.0)
Calcium: 9.2 mg/dL (ref 8.5–10.5)
Chloride: 105 mMol/L (ref 98–110)
Creatinine: 0.99 mg/dL (ref 0.80–1.30)
EGFR: 87 mL/min/{1.73_m2} (ref 60–150)
Globulin: 3.9 gm/dL (ref 2.0–4.0)
Glucose: 128 mg/dL — ABNORMAL HIGH (ref 70–99)
Osmolality Calc: 288 mOsm/kg (ref 275–300)
Potassium: 4.5 mMol/L (ref 3.5–5.3)
Protein, Total: 6.6 gm/dL (ref 6.0–8.3)
Sodium: 141 mMol/L (ref 136–147)

## 2016-03-21 LAB — CBC AND DIFFERENTIAL
Basophils %: 1 % (ref 0.0–3.0)
Basophils Absolute: 0.1 10*3/uL (ref 0.0–0.3)
Eosinophils %: 2.6 % (ref 0.0–7.0)
Eosinophils Absolute: 0.2 10*3/uL (ref 0.0–0.8)
Hematocrit: 37 % — ABNORMAL LOW (ref 39.0–52.5)
Hemoglobin: 12.3 gm/dL — ABNORMAL LOW (ref 13.0–17.5)
Lymphocytes Absolute: 1.8 10*3/uL (ref 0.6–5.1)
Lymphocytes: 26 % (ref 15.0–46.0)
MCH: 29 pg (ref 28–35)
MCHC: 33 gm/dL (ref 31–36)
MCV: 87 fL (ref 80–100)
MPV: 7.9 fL (ref 6.0–10.0)
Monocytes Absolute: 0.4 10*3/uL (ref 0.1–1.7)
Monocytes: 5 % (ref 3.0–15.0)
Neutrophils %: 65.5 % (ref 42.0–78.0)
Neutrophils Absolute: 4.6 10*3/uL (ref 1.7–8.6)
PLT CT: 244 10*3/uL (ref 130–440)
RBC: 4.26 10*6/uL (ref 4.00–5.70)
RDW: 12.9 % (ref 10.5–14.5)
WBC: 7.1 10*3/uL (ref 4.00–11.00)

## 2016-03-28 ENCOUNTER — Other Ambulatory Visit
Admission: RE | Admit: 2016-03-28 | Discharge: 2016-03-28 | Disposition: A | Discharge status: Home / Self Care | Payer: BC Managed Care – PPO | Source: Ambulatory Visit | Attending: Foot & Ankle Surgery | Admitting: Foot & Ankle Surgery

## 2016-03-28 DIAGNOSIS — E11621 Type 2 diabetes mellitus with foot ulcer: Secondary | ICD-10-CM | POA: Insufficient documentation

## 2016-03-28 DIAGNOSIS — M868X7 Other osteomyelitis, ankle and foot: Secondary | ICD-10-CM | POA: Insufficient documentation

## 2016-03-28 LAB — CBC AND DIFFERENTIAL
Basophils %: 1.2 % (ref 0.0–3.0)
Basophils Absolute: 0.1 10*3/uL (ref 0.0–0.3)
Eosinophils %: 2.8 % (ref 0.0–7.0)
Eosinophils Absolute: 0.2 10*3/uL (ref 0.0–0.8)
Hematocrit: 40.1 % (ref 39.0–52.5)
Hemoglobin: 13.1 gm/dL (ref 13.0–17.5)
Lymphocytes Absolute: 1.8 10*3/uL (ref 0.6–5.1)
Lymphocytes: 21 % (ref 15.0–46.0)
MCH: 29 pg (ref 28–35)
MCHC: 33 gm/dL (ref 31–36)
MCV: 88 fL (ref 80–100)
MPV: 9.2 fL (ref 6.0–10.0)
Monocytes Absolute: 0.5 10*3/uL (ref 0.1–1.7)
Monocytes: 6.3 % (ref 3.0–15.0)
Neutrophils %: 68.6 % (ref 42.0–78.0)
Neutrophils Absolute: 5.7 10*3/uL (ref 1.7–8.6)
PLT CT: 224 10*3/uL (ref 130–440)
RBC: 4.57 10*6/uL (ref 4.00–5.70)
RDW: 13.2 % (ref 10.5–14.5)
WBC: 8.4 10*3/uL (ref 4.00–11.00)

## 2016-03-28 LAB — COMPREHENSIVE METABOLIC PANEL
ALT: 17 U/L (ref 0–55)
AST (SGOT): 24 U/L (ref 10–42)
Albumin/Globulin Ratio: 0.86 Ratio (ref 0.70–1.50)
Albumin: 2.8 gm/dL — ABNORMAL LOW (ref 3.5–5.0)
Alkaline Phosphatase: 80 U/L (ref 40–145)
Anion Gap: 10.8 mMol/L (ref 7.0–18.0)
BUN / Creatinine Ratio: 16.5 Ratio (ref 10.0–30.0)
BUN: 14 mg/dL (ref 7–22)
Bilirubin, Total: 0.2 mg/dL (ref 0.1–1.2)
CO2: 28.1 mMol/L (ref 20.0–30.0)
Calcium: 8.6 mg/dL (ref 8.5–10.5)
Chloride: 107 mMol/L (ref 98–110)
Creatinine: 0.85 mg/dL (ref 0.80–1.30)
EGFR: 100 mL/min/{1.73_m2} (ref 60–150)
Globulin: 3.3 gm/dL (ref 2.0–4.0)
Glucose: 119 mg/dL — ABNORMAL HIGH (ref 70–99)
Osmolality Calc: 283 mOsm/kg (ref 275–300)
Potassium: 4.4 mMol/L (ref 3.5–5.3)
Protein, Total: 6.1 gm/dL (ref 6.0–8.3)
Sodium: 141 mMol/L (ref 136–147)

## 2016-03-28 NOTE — Progress Notes (Signed)
03/10/16 1604   Case Management Quick Doc   Case Management Assessment Status Assessment Complete   CM Comments 1/25 RNCM:  [place]'ed home today on IV antibiotics. see detailed notes.   Physical Discharge Disposition Home with Needs   Discharge Disposition   Name of [place]   Name of [place]   Mode of Transportation Car   CM Interventions   Follow up appointment scheduled? Yes   Referral made for home health RN visit? Yes     Confirmed [place] plans with pt., [facility] and [facility].     Pt requesting a knee walker for mobility assistance. Referral made to [facility] Per [facility] pt is to stop by the [employer] store to obtain the knee walker. Per [facility]: " pt called the store and he is going to private pay for a few weeks as his insurance is wanting him to buy it thank you.    [place]  Orthopaedic Nurse Case Manager  [phone]

## 2016-04-04 ENCOUNTER — Other Ambulatory Visit
Admission: RE | Admit: 2016-04-04 | Discharge: 2016-04-04 | Disposition: A | Discharge status: Home / Self Care | Payer: BC Managed Care – PPO | Source: Skilled Nursing Facility | Attending: Internal Medicine | Admitting: Internal Medicine

## 2016-04-04 DIAGNOSIS — M898X7 Other specified disorders of bone, ankle and foot: Secondary | ICD-10-CM | POA: Insufficient documentation

## 2016-04-04 DIAGNOSIS — E11621 Type 2 diabetes mellitus with foot ulcer: Secondary | ICD-10-CM | POA: Insufficient documentation

## 2016-04-04 LAB — COMPREHENSIVE METABOLIC PANEL
ALT: 13 U/L (ref 0–55)
AST (SGOT): 17 U/L (ref 10–42)
Albumin/Globulin Ratio: 0.72 Ratio (ref 0.70–1.50)
Albumin: 2.7 gm/dL — ABNORMAL LOW (ref 3.5–5.0)
Alkaline Phosphatase: 90 U/L (ref 40–145)
Anion Gap: 13.5 mMol/L (ref 7.0–18.0)
BUN / Creatinine Ratio: 15.6 Ratio (ref 10.0–30.0)
BUN: 15 mg/dL (ref 7–22)
Bilirubin, Total: 0.2 mg/dL (ref 0.1–1.2)
CO2: 26.5 mMol/L (ref 20.0–30.0)
Calcium: 9.2 mg/dL (ref 8.5–10.5)
Chloride: 105 mMol/L (ref 98–110)
Creatinine: 0.96 mg/dL (ref 0.80–1.30)
EGFR: 91 mL/min/{1.73_m2} (ref 60–150)
Globulin: 3.8 gm/dL (ref 2.0–4.0)
Glucose: 176 mg/dL — ABNORMAL HIGH (ref 70–99)
Osmolality Calc: 286 mOsm/kg (ref 275–300)
Potassium: 4.2 mMol/L (ref 3.5–5.3)
Protein, Total: 6.5 gm/dL (ref 6.0–8.3)
Sodium: 141 mMol/L (ref 136–147)

## 2016-04-04 LAB — CBC AND DIFFERENTIAL
Basophils %: 0.8 % (ref 0.0–3.0)
Basophils Absolute: 0.1 10*3/uL (ref 0.0–0.3)
Eosinophils %: 3.8 % (ref 0.0–7.0)
Eosinophils Absolute: 0.3 10*3/uL (ref 0.0–0.8)
Hematocrit: 41.4 % (ref 39.0–52.5)
Hemoglobin: 13.6 gm/dL (ref 13.0–17.5)
Lymphocytes Absolute: 1.7 10*3/uL (ref 0.6–5.1)
Lymphocytes: 23.2 % (ref 15.0–46.0)
MCH: 29 pg (ref 28–35)
MCHC: 33 gm/dL (ref 31–36)
MCV: 87 fL (ref 80–100)
MPV: 9 fL (ref 6.0–10.0)
Monocytes Absolute: 0.4 10*3/uL (ref 0.1–1.7)
Monocytes: 5.6 % (ref 3.0–15.0)
Neutrophils %: 66.7 % (ref 42.0–78.0)
Neutrophils Absolute: 5 10*3/uL (ref 1.7–8.6)
PLT CT: 178 10*3/uL (ref 130–440)
RBC: 4.77 10*6/uL (ref 4.00–5.70)
RDW: 13.3 % (ref 10.5–14.5)
WBC: 7.6 10*3/uL (ref 4.00–11.00)

## 2016-04-12 ENCOUNTER — Ambulatory Visit (RURAL_HEALTH_CENTER): Payer: BC Managed Care – PPO | Admitting: Family Medicine

## 2016-04-22 ENCOUNTER — Ambulatory Visit: Payer: BC Managed Care – PPO | Attending: Family Medicine | Admitting: Family Medicine

## 2016-04-22 DIAGNOSIS — Z794 Long term (current) use of insulin: Secondary | ICD-10-CM

## 2016-04-22 DIAGNOSIS — E1142 Type 2 diabetes mellitus with diabetic polyneuropathy: Secondary | ICD-10-CM

## 2016-04-22 DIAGNOSIS — I10 Essential (primary) hypertension: Secondary | ICD-10-CM

## 2016-04-22 MED ORDER — LANTUS SOLOSTAR 100 UNIT/ML SC SOPN
40.0000 [IU] | PEN_INJECTOR | Freq: Every evening | SUBCUTANEOUS | 3 refills | Status: DC
Start: 2016-04-22 — End: 2016-04-25

## 2016-04-22 MED ORDER — INSULIN ASPART 100 UNIT/ML SC SOPN
7.0000 [IU] | PEN_INJECTOR | Freq: Three times a day (TID) | SUBCUTANEOUS | 3 refills | Status: DC
Start: 2016-04-22 — End: 2016-05-09

## 2016-04-22 MED ORDER — LANTUS SOLOSTAR 100 UNIT/ML SC SOPN
20.0000 [IU] | PEN_INJECTOR | Freq: Every morning | SUBCUTANEOUS | 3 refills | Status: DC
Start: 2016-04-22 — End: 2016-06-13

## 2016-04-22 MED ORDER — LISINOPRIL 20 MG PO TABS
40.0000 mg | ORAL_TABLET | Freq: Every evening | ORAL | 3 refills | Status: DC
Start: 2016-04-22 — End: 2016-07-01

## 2016-04-22 NOTE — Patient Instructions (Addendum)
1. We will increase the lisinopril to 40mg  daily  2. Continue your present dose of insulin and we will check your A1C today and call you with the results  3. I would like for you to get your blood drawn at [facility] or [facility] a couple of days prior to your appointment in 3 months.  I would like you to fast before the lab work 8-10 hours with water only, or you can have plain tea or black coffee.  Diabetic medications should not be taken prior to the lab work being drawn, and should resume them when they eat after the blood work.

## 2016-04-23 ENCOUNTER — Ambulatory Visit
Admission: RE | Admit: 2016-04-23 | Discharge: 2016-04-23 | Disposition: A | Discharge status: Home / Self Care | Payer: BC Managed Care – PPO | Source: Ambulatory Visit | Attending: Family Medicine | Admitting: Family Medicine

## 2016-04-23 DIAGNOSIS — Z794 Long term (current) use of insulin: Secondary | ICD-10-CM | POA: Insufficient documentation

## 2016-04-23 DIAGNOSIS — E1142 Type 2 diabetes mellitus with diabetic polyneuropathy: Secondary | ICD-10-CM | POA: Insufficient documentation

## 2016-04-23 LAB — COMPREHENSIVE METABOLIC PANEL
ALT: 29 U/L (ref 0–55)
AST (SGOT): 16 U/L (ref 10–42)
Albumin/Globulin Ratio: 0.81 Ratio (ref 0.70–1.50)
Albumin: 2.7 gm/dL — ABNORMAL LOW (ref 3.5–5.0)
Alkaline Phosphatase: 83 U/L (ref 40–145)
Anion Gap: 12.2 mMol/L (ref 7.0–18.0)
BUN / Creatinine Ratio: 23.2 Ratio (ref 10.0–30.0)
BUN: 26 mg/dL — ABNORMAL HIGH (ref 7–22)
Bilirubin, Total: 0.3 mg/dL (ref 0.1–1.2)
CO2: 25.7 mMol/L (ref 20.0–30.0)
Calcium: 9 mg/dL (ref 8.5–10.5)
Chloride: 106 mMol/L (ref 98–110)
Creatinine: 1.12 mg/dL (ref 0.80–1.30)
EGFR: 75 mL/min/{1.73_m2} (ref 60–150)
Globulin: 3.4 gm/dL (ref 2.0–4.0)
Glucose: 208 mg/dL — ABNORMAL HIGH (ref 70–99)
Osmolality Calc: 289 mOsm/kg (ref 275–300)
Potassium: 4.6 mMol/L (ref 3.5–5.3)
Protein, Total: 6.1 gm/dL (ref 6.0–8.3)
Sodium: 140 mMol/L (ref 136–147)

## 2016-04-23 LAB — LIPID PANEL
Cholesterol: 188 mg/dL (ref 75–199)
Coronary Heart Disease Risk: 5.22
HDL: 36 mg/dL — ABNORMAL LOW (ref 40–55)
LDL Calculated: 89 mg/dL
Triglycerides: 314 mg/dL — ABNORMAL HIGH (ref 10–150)
VLDL: 63 — ABNORMAL HIGH (ref 0–40)

## 2016-04-23 LAB — HEMOGLOBIN A1C: Hgb A1C, %: 8.7 %

## 2016-04-23 NOTE — Progress Notes (Signed)
Subjective:    Patient ID: [name] is a 51 y.o. male.  Chief Complaint   Patient presents with   . Diabetes       HPI:  1. Diabetes mellitus -Patient reports compliance with diet and medication.  Present medications include  Lantus 20 units in the am and 40 units at hs, novolog 7 units prior to meals.  The last eye exam was  November 2017.  The patient is taking a daily aspirin, and ACEI or ARB, and a statin.  Denies polyuria, polydipsia, polyphagia, weight change, blurred vision.  Patient is monitoring fingerstick glucose and has readings of  100-150.   No history of diabetic retinopathy, diabetic nephropathy, diabetic neuropathy, diabetic gastroparesis.  2. Hypertension - Reports compliance with medications and diet. Present medications include lisinopril 30mg  . Experiencing no medication side effects. Denies: chest pain, headache, focal neurological symptoms, shortness of breath, epistaxis, dizziness, peripheral edema.    3. Diabetic foot ulcer - this has healed and he is presently wearing compression stocking which he did not intially like but he has been wearing them to work and finds that that they have been helpful.  He has not pain to his legs now         The following portions of the patient's history were reviewed and updated as appropriate: allergies, current medications, past family history, past medical history, past social history, past surgical history and problem list.         Review of Systems 14 System Review negative except as noted in the history of present illness          Objective:    Physical Exam   Constitutional: He is oriented to person, place, and time. He appears well-developed and well-nourished. No distress.   HENT:   Head: Normocephalic and atraumatic.   Eyes: Conjunctivae are normal. Pupils are equal, round, and reactive to light. Right eye exhibits no discharge. Left eye exhibits no discharge. No scleral icterus.   Fundoscopic exam:       The right eye shows no AV nicking,  no exudate, no hemorrhage and no papilledema. The right eye shows red reflex.        The left eye shows no exudate, no hemorrhage and no papilledema. The left eye shows red reflex.   Neck: Neck supple. No JVD present. Carotid bruit is not present. No tracheal deviation present. No thyromegaly present.   Cardiovascular: Normal rate, regular rhythm and normal heart sounds.  Exam reveals no gallop and no friction rub.    No murmur heard.  Pulmonary/Chest: Effort normal and breath sounds normal. No respiratory distress. He has no wheezes. He has no rales.   Musculoskeletal: He exhibits no edema or deformity.   Lymphadenopathy:     He has no cervical adenopathy.   Neurological: He is alert and oriented to person, place, and time. No cranial nerve deficit.   Skin: Skin is warm and dry. Capillary refill takes less than 2 seconds. No rash noted. No erythema. No pallor.   Psychiatric: He has a normal mood and affect. His behavior is normal. Judgment and thought content normal.   Vitals reviewed.    Visit Vitals  BP 141/82   Pulse (!) 104   Resp 16   Ht 1.676 m (5\' 6" )   Wt 105.2 kg (232 lb)   BMI 37.45 kg/m       Assessment:     1. Type 2 diabetes mellitus with diabetic polyneuropathy, with long-term  current use of insulin    2. Essential hypertension         Plan:     Orders Placed This Encounter   Procedures   . Comprehensive metabolic panel     Standing Status:   Future     Number of Occurrences:   1     Standing Expiration Date:   04/23/2017     Order Specific Question:   Has the patient fasted?     Answer:   Yes   . Hemoglobin A1C     Standing Status:   Future     Number of Occurrences:   1     Standing Expiration Date:   04/23/2017   . Lipid panel     Standing Status:   Future     Number of Occurrences:   1     Standing Expiration Date:   04/23/2017     Order Specific Question:   Has the patient fasted?     Answer:   Yes        Requested Prescriptions     Signed Prescriptions Disp Refills   . insulin glargine (LANTUS  SOLOSTAR) 100 UNIT/ML injection pen 15 mL 3     Sig: Inject 40 Units into the skin nightly.   . insulin glargine (LANTUS SOLOSTAR) 100 UNIT/ML injection pen 15 mL 3     Sig: Inject 20 Units into the skin every morning.   . insulin aspart (NOVOLOG FLEXPEN) 100 UNIT/ML injection pen 6 pen 3     Sig: Inject 7 Units into the skin 3 (three) times daily before meals.   Marland Kitchen lisinopril (PRINIVIL,ZESTRIL) 20 MG tablet 180 tablet 3     Sig: Take 2 tablets (40 mg total) by mouth nightly.       Return in about 3 months (around 07/23/2016).    Patient Instructions   1. We will increase the lisinopril to 40mg  daily  2. Continue your present dose of insulin and we will check your A1C today and call you with the results  3. I would like for you to get your blood drawn at [facility] or [facility] a couple of days prior to your appointment in 3 months.  I would like you to fast before the lab work 8-10 hours with water only, or you can have plain tea or black coffee.  Diabetic medications should not be taken prior to the lab work being drawn, and should resume them when they eat after the blood work.          Patient was counseled on possible medicine side effects which may include rash, swelling and/or stomach upset.  Patient was instructed to notify me or the ER if they experience problems.    Discussed with patient all diagnostics and consults ordered and addressed patient concerns prior to the completion of the visit.  Patient will be notified of results when they become available.  Encouraged to call for results if no contact within 14 days.     The patient's electronic medical record was reviewed, any changes in the past medical history, past surgical history, medications, diagnostic tests were noted, and the record was updated accordingly.    Discussed option available for my chart which provides electronic access to diagnostic results.      EHR -  Progress note was generated using Editor, commissioning.  Random word insertion and deletion,  inappropriate gender pronouns, and word inaccuracies may result from software deficiency.  If there any any  questions regarding the accuracy of this note, please feel free to contact me for clarification.     Signed by: Rhunette Croft, MD

## 2016-04-25 ENCOUNTER — Other Ambulatory Visit (RURAL_HEALTH_CENTER): Payer: Self-pay

## 2016-04-25 MED ORDER — LANTUS SOLOSTAR 100 UNIT/ML SC SOPN
PEN_INJECTOR | SUBCUTANEOUS | 3 refills | Status: DC
Start: 2016-04-25 — End: 2016-05-23

## 2016-05-06 ENCOUNTER — Other Ambulatory Visit (RURAL_HEALTH_CENTER): Payer: Self-pay | Admitting: Family Medicine

## 2016-05-06 NOTE — Telephone Encounter (Signed)
Pt states instructed to increase - now out of medication

## 2016-05-06 NOTE — Telephone Encounter (Signed)
Pt calling, states that he was instructed to increase his Lipitor dose, and now he is out of medication. Also, pt states that the Novolog sliding scale should be put on order because he is not getting enough of that either.

## 2016-05-09 ENCOUNTER — Other Ambulatory Visit (RURAL_HEALTH_CENTER): Payer: Self-pay | Admitting: Nurse Practitioner

## 2016-05-09 DIAGNOSIS — E1142 Type 2 diabetes mellitus with diabetic polyneuropathy: Secondary | ICD-10-CM

## 2016-05-09 DIAGNOSIS — Z794 Long term (current) use of insulin: Secondary | ICD-10-CM

## 2016-05-09 MED ORDER — INSULIN ASPART 100 UNIT/ML SC SOPN
7.0000 [IU] | PEN_INJECTOR | Freq: Three times a day (TID) | SUBCUTANEOUS | 0 refills | Status: DC
Start: 2016-05-09 — End: 2016-05-19

## 2016-05-09 MED ORDER — ATORVASTATIN CALCIUM 20 MG PO TABS
20.0000 mg | ORAL_TABLET | Freq: Every evening | ORAL | 0 refills | Status: DC
Start: 2016-05-09 — End: 2016-08-11

## 2016-05-19 ENCOUNTER — Other Ambulatory Visit (RURAL_HEALTH_CENTER): Payer: Self-pay | Admitting: Family Medicine

## 2016-05-19 DIAGNOSIS — E1142 Type 2 diabetes mellitus with diabetic polyneuropathy: Secondary | ICD-10-CM

## 2016-05-19 DIAGNOSIS — Z794 Long term (current) use of insulin: Secondary | ICD-10-CM

## 2016-05-19 MED ORDER — INSULIN ASPART 100 UNIT/ML SC SOPN
PEN_INJECTOR | SUBCUTANEOUS | 3 refills | Status: DC
Start: 2016-05-19 — End: 2016-06-13

## 2016-05-23 ENCOUNTER — Encounter (RURAL_HEALTH_CENTER): Payer: Self-pay | Admitting: Family Medicine

## 2016-05-23 ENCOUNTER — Ambulatory Visit: Payer: BC Managed Care – PPO | Attending: Family Medicine | Admitting: Family Medicine

## 2016-05-23 VITALS — BP 158/82 | HR 98 | Temp 97.6°F | Resp 16 | Ht 66.0 in | Wt 240.4 lb

## 2016-05-23 DIAGNOSIS — Z794 Long term (current) use of insulin: Secondary | ICD-10-CM

## 2016-05-23 DIAGNOSIS — E1142 Type 2 diabetes mellitus with diabetic polyneuropathy: Secondary | ICD-10-CM

## 2016-06-13 ENCOUNTER — Other Ambulatory Visit (RURAL_HEALTH_CENTER): Payer: Self-pay

## 2016-06-13 DIAGNOSIS — E1142 Type 2 diabetes mellitus with diabetic polyneuropathy: Secondary | ICD-10-CM

## 2016-06-13 DIAGNOSIS — Z794 Long term (current) use of insulin: Secondary | ICD-10-CM

## 2016-06-14 MED ORDER — LANTUS SOLOSTAR 100 UNIT/ML SC SOPN
PEN_INJECTOR | SUBCUTANEOUS | 3 refills | Status: DC
Start: 2016-06-14 — End: 2016-12-21

## 2016-06-14 MED ORDER — INSULIN ASPART 100 UNIT/ML SC SOPN
PEN_INJECTOR | SUBCUTANEOUS | 3 refills | Status: DC
Start: 2016-06-14 — End: 2017-02-16

## 2016-06-21 NOTE — Progress Notes (Signed)
Subjective:    Patient ID: [name] is a 51 y.o. male.  Chief Complaint   Patient presents with   . Diabetes       HPI     Diabetes Mellitus - patient is compliant with medications and with diet.  He denies polyuria, polydipsia, polyphagia    The following portions of the patient's history were reviewed and updated as appropriate: allergies, current medications, past family history, past medical history, past social history, past surgical history and problem list.    Review of Systems 14 System Review negative except as noted in the history of present illness          Objective:    Physical Exam   Constitutional: He appears well-developed and well-nourished. No distress.   HENT:   Head: Normocephalic and atraumatic.   Eyes: Right eye exhibits no discharge. Left eye exhibits no discharge. No scleral icterus.   Neck: No tracheal deviation present.   Cardiovascular: Normal rate and regular rhythm.    Pulmonary/Chest: Effort normal and breath sounds normal.   Musculoskeletal: He exhibits no edema or deformity.   Neurological: He is alert.   Psychiatric: He has a normal mood and affect. His behavior is normal. Judgment and thought content normal.   Vitals reviewed.        Visit Vitals  BP 158/82 (BP Site: Left arm, Patient Position: Sitting)   Pulse 98   Temp 97.6 F (36.4 C) (Tympanic)   Resp 16   Ht 1.676 m (5\' 6" )   Wt 109 kg (240 lb 6.4 oz)   SpO2 97%   BMI 38.80 kg/m       Assessment:     1. Type 2 diabetes mellitus with diabetic polyneuropathy, with long-term current use of insulin           Plan:   No orders of the defined types were placed in this encounter.       Requested Prescriptions      No prescriptions requested or ordered in this encounter       FU in 22months    There are no Patient Instructions on file for this visit.     Patient was counseled on possible medicine side effects which may include rash, swelling and/or stomach upset.  Patient was instructed to notify me or the ER if they  experience problems.    Discussed with patient all diagnostics and consults ordered and addressed patient concerns prior to the completion of the visit.  Patient will be notified of results when they become available.  Encouraged to call for results if no contact within 14 days.     The patient's electronic medical record was reviewed, any changes in the past medical history, past surgical history, medications, diagnostic tests were noted, and the record was updated accordingly.    Discussed option available for my chart which provides electronic access to diagnostic results.      EHR -  Progress note was generated using Editor, commissioning.  Random word insertion and deletion, inappropriate gender pronouns, and word inaccuracies may result from software deficiency.  If there any any questions regarding the accuracy of this note, please feel free to contact me for clarification.     Signed by: Rhunette Croft, MD

## 2016-06-22 ENCOUNTER — Emergency Department: Payer: BC Managed Care – PPO

## 2016-06-22 ENCOUNTER — Ambulatory Visit (RURAL_HEALTH_CENTER): Payer: BC Managed Care – PPO | Admitting: Nurse Practitioner

## 2016-06-22 ENCOUNTER — Emergency Department
Admission: EM | Admit: 2016-06-22 | Discharge: 2016-06-22 | Disposition: A | Discharge status: Home / Self Care | Payer: BC Managed Care – PPO | Attending: Family Medicine | Admitting: Family Medicine

## 2016-06-22 DIAGNOSIS — I10 Essential (primary) hypertension: Secondary | ICD-10-CM | POA: Insufficient documentation

## 2016-06-22 NOTE — Discharge Instructions (Signed)
Discharge Instructions: Taking Your Blood Pressure  Blood pressure is the force of blood as it moves from the heart through the blood vessels. You can take your own blood pressure reading using a digital monitor. Take readings as often as yourhealthcare providerinstructs. Take your readings each timein the same way, using the same arm. Here are guidelines for taking your blood pressure.  The American Heart Association (AHA) recommends purchasing a blood pressure monitor that is validated and approved by the Association for the Advancement of Medical Instrumentation, the [place] Hypertension Society, and the International Protocol for the Validation of Automated BP Measuring Devices. If the blood pressure monitor is for a senior adult, a pregnant woman, or a child, make certain it is validated for use with such a population. For the most reliable readings, the AHA recommends an automatic, cuff-style, upper arm (bicep) monitor. The readings from finger and wrist monitors are not as reliable as the upper arm monitor.      Step 1. Relax     Wait at least a half hour after smoking,eating,or exercising. Do not drink coffee, tea, soda, or other caffeinated beverages before checking your blood pressure.   Sit comfortably at a table. Place the monitor near you.   Rest for a few minutes before you begin.      Step 2. Wrap the cuff     Place your arm on the table,palm up. Put your arm in a positionthat islevelwith your heart. Wrap the cuff around your upper arm, about an inch above your elbow. It's best to wrap the cuff on bare skin,not over clothing.   Make sure your cuff fits. If it doesn'twrap around your upper arm,order a larger cuff. A cuff that is too large or too small can result in an inaccurate blood pressure reading.        Step 3. Inflate the cuff     Pump the cuff until the scale reads 200. If you have a self-inflating cuff,push the button that starts the pump.   The cuff will  tighten,then loosen.   The numbers will change. When they stop changing, your blood pressure reading will appear.   If you get a reading that is too high or too low for you, relax for a few minutes. Then do the test again.    Step 4. Write down the results   Write down your blood pressure numbers. Mark the date and time. Keep your results in one place,such as a notebook.   Remove the cuff from your arm. Turn off the machine.   Take the readings with you to your medical appointments.   If you start a new blood pressure medicine, or change a blood pressure medicine dose, note the day you started the new drug or dosage on your blood pressure recording sheet. This will help your healthcare provider monitor the effect of medication changes.    Date Last Reviewed: 06/11/2014   2000-2016 [employer] [address], [place] [address]. All rights reserved. This information is not intended as a substitute for professional medical care. Always follow your healthcare professional's instructions.

## 2016-06-22 NOTE — ED Provider Notes (Signed)
EMERGENCY DEPARTMENT HISTORY AND PHYSICAL EXAM    Date: 06/22/16  Patient Name: [name]  Attending Physician: Aaron Mose, MD  Patient DOB:  [birth year]  MRN:  [id number]  Room:  E2/ED2-A      History of Presenting Illness     Chief Complaint: Elevated blood pressure     HPI/ROS is limited by: none  HPI/ROS given by: patient       Context: [name] is a 51 y.o. male who presents with having had an elevated BP while at work this AM. He works at [employer] and had a reading of 170/102 and was sent home. He denies chest pain, SOB, nausea, vomiting or abdominal pain. He has been occasionally dizzy but is not now. He is here to get cleared for work.   Location: generalized  Severity: mild  Duration: 1 day   Quality: vague   Associated Signs/ Symptoms: high blood pressure  Exacerbation/Mitigating factors: none       PMD: Rhunette Croft, MD    Past Medical History     Past Medical History:   Diagnosis Date   . Complication of anesthesia     last time he was kicking his legs had to wake him up   . Diabetes mellitus    . Diabetic ulcer of left foot 12/26/14   . Disc    . Gastroesophageal reflux disease    . Hyperlipidemia    . Hypertension    . Seasonal allergic rhinitis    . Sleep apnea     not tested   . Type 2 diabetes mellitus, controlled        Past Surgical History     Past Surgical History:   Procedure Laterality Date   . ARTHRODESIS, TOE Left 05/04/2015    Procedure: ARTHRODESIS, TOE;  Surgeon: Janora Norlander, DPM;  Location[facility];  Service: Podiatry;  Laterality: Left;  1st MPJ FUSION   . DEBRIDEMENT & IRRIGATION, LOWER EXTREMITY Left 03/12/2015    Procedure: Muniz, LOWER EXTREMITY;  Surgeon: Janora Norlander, DPM;  Location[facility];  Service: Podiatry;  Laterality: Left;  I&D LEFT FOOT FOR BONE INFECTION   . DEBRIDEMENT & IRRIGATION, LOWER EXTREMITY Left 03/07/2016    Procedure: Warren, LEFT FOOT 2ND TOE, EXCISION  OF 2ND METATARSEL;  Surgeon: Janora Norlander, DPM;  Location: [facility];  Service: Podiatry;  Laterality: Left;  I&D Left Foot   . HAND SURGERY         Family History     Family History   Problem Relation Age of Onset   . Diabetes Mother    . Depression Sister    . COPD Sister    . Hypertension Brother        Social History     Social History     Social History   . Marital status: Single     Spouse name: N/A   . Number of children: N/A   . Years of education: N/A     Social History Main Topics   . Smoking status: Current Every Day Smoker     Packs/day: 0.25     Types: Cigarettes   . Smokeless tobacco: Never Used   . Alcohol use No   . Drug use: No   . Sexual activity: Not on file     Other Topics Concern   . Not on file     Social History Narrative   .  No narrative on file       Allergies     Allergies   Allergen Reactions   . Flu Virus Vaccine Anaphylaxis       Home Medications     Prior to Admission medications    Medication Sig Start Date End Date Taking? Authorizing Provider   aspirin EC 81 MG EC tablet Take 81 mg by mouth every evening.   Yes [provider]   atorvastatin (LIPITOR) 20 MG tablet Take 1 tablet (20 mg total) by mouth nightly. 05/09/16  Yes Sharp, Skyler T, NP   gabapentin (NEURONTIN) 300 MG capsule Take 1 capsule (300 mg total) by mouth 3 (three) times daily. 03/15/16  Yes Rhunette Croft, MD   insulin aspart (NOVOLOG FLEXPEN) 100 UNIT/ML injection pen Inject per sliding scale. Max 30 units per day. 06/14/16  Yes Rhunette Croft, MD   insulin glargine (LANTUS SOLOSTAR) 100 UNIT/ML injection pen Inject 20 units in the morning and 40 units at bedtime. 06/14/16  Yes Rhunette Croft, MD   lisinopril (PRINIVIL,ZESTRIL) 20 MG tablet Take 2 tablets (40 mg total) by mouth nightly. 04/22/16  Yes Rhunette Croft, MD   cyclobenzaprine (FLEXERIL) 10 MG tablet Take 1 tablet (10 mg total) by mouth every 8 (eight) hours as needed for Muscle spasms (pain or muscle  spasm). 05/27/14   Melynda Keller, [place]       ED Medications Administered     ED Medication Orders     None            Review of Systems     Review of Systems   Constitutional: Negative for chills and fever.   HENT: Negative for ear pain and sore throat.    Eyes: Negative for discharge and redness.   Respiratory: Negative for cough and shortness of breath.    Cardiovascular: Negative for chest pain and palpitations.   Gastrointestinal: Negative for abdominal pain, diarrhea, nausea and vomiting.   Genitourinary: Negative for dysuria.   Musculoskeletal: Negative for neck pain.   Skin: Negative for rash.   Neurological: Negative for dizziness, speech change, focal weakness, seizures and loss of consciousness.   Endo/Heme/Allergies: Does not bruise/bleed easily.   All other systems reviewed and are negative.        Physical Exam     Physical Exam   Constitutional: He is oriented to person, place, and time. He appears well-developed and well-nourished.   HENT:   Head: Normocephalic and atraumatic.   Mouth/Throat: Oropharynx is clear and moist.   Eyes: EOM are normal. Pupils are equal, round, and reactive to light.   Neck: Normal range of motion. Neck supple.   Cardiovascular: Normal rate, normal heart sounds and intact distal pulses.    Pulmonary/Chest: Effort normal and breath sounds normal.   Abdominal: Soft. Bowel sounds are normal. There is no tenderness.   Musculoskeletal: Normal range of motion.   Neurological: He is alert and oriented to person, place, and time.   Skin: Skin is warm and dry.   Nursing note and vitals reviewed.      Procedures     N/A    Diagnostic Study Results     EKG: N/A    Monitor: N/A    Laboratory results reviewed by ED provider:    Results     ** No results found for the last 24 hours. **          Radiologic study results reviewed by ED provider:  Radiology Results (24 Hour)     ** No results found for the last 24 hours. **      .    Rendering Provider: Martin Majestic II, MD      VS      Patient Vitals for the past 24 hrs:   BP Temp Temp src Pulse Resp SpO2 Height Weight   06/22/16 1449 149/84 98.9 F (37.2 C) Oral (!) 104 16 97 % 1.689 m 108.4 kg         Clinical Course in Emergency Department     Consults: N/A    Reevaluation: N/A    MDM: Administrative Note,     Diagnosis and Disposition     Clinical Impression  1. Hypertension, unspecified type        Disposition  ED Disposition     ED Disposition Condition Date/Time Comment    Discharge  Wed Jun 22, 2016  3:09 PM [name] discharge to home/self care.    Condition at disposition: Stable          Vital signs were reviewed at the time of disposition.  Patient Vitals for the past 24 hrs:   BP Temp Temp src Pulse Resp SpO2 Height Weight   06/22/16 1449 149/84 98.9 F (37.2 C) Oral (!) 104 16 97 % 1.689 m 108.4 kg          Prescriptions  New Prescriptions    No medications on file         Follow-up Information     Rhunette Croft, MD.    Specialty:  Family Medicine  Contact information:  [place] [id number]  [phone]                       SIGNED BY: Martin Majestic II, MD        This chart was generated by an EMR and may contain errors, including typographical, or omissions not intended by the user.               Aaron Mose, MD  06/22/16 [phone]

## 2016-06-22 NOTE — ED Triage Notes (Signed)
Pt had high bp reading at work [employer]) 170/102 and was sent here for work clearance. Pt denies HA or CP. Pt admits to high stress, high salt in diet and hx of htn and takes his bp meds at night. Taking meds as ordered.  Chief Complaint   Patient presents with   . Hypertension     BP 149/84   Pulse (!) 104   Temp 98.9 F (37.2 C) (Oral)   Resp 16   Ht 1.689 m   Wt 108.4 kg   SpO2 97%   BMI 38.00 kg/m   Past Medical History:   Diagnosis Date   . Complication of anesthesia     last time he was kicking his legs had to wake him up   . Diabetes mellitus    . Diabetic ulcer of left foot 12/26/14   . Disc    . Gastroesophageal reflux disease    . Hyperlipidemia    . Hypertension    . Seasonal allergic rhinitis    . Sleep apnea     not tested   . Type 2 diabetes mellitus, controlled

## 2016-07-01 ENCOUNTER — Encounter (RURAL_HEALTH_CENTER): Payer: Self-pay | Admitting: Nurse Practitioner

## 2016-07-01 ENCOUNTER — Ambulatory Visit: Payer: BC Managed Care – PPO | Attending: Nurse Practitioner | Admitting: Nurse Practitioner

## 2016-07-01 VITALS — BP 140/80 | HR 104 | Temp 98.0°F | Resp 16 | Ht 66.0 in | Wt 243.6 lb

## 2016-07-01 DIAGNOSIS — F17210 Nicotine dependence, cigarettes, uncomplicated: Secondary | ICD-10-CM

## 2016-07-01 DIAGNOSIS — E1165 Type 2 diabetes mellitus with hyperglycemia: Secondary | ICD-10-CM

## 2016-07-01 DIAGNOSIS — Z23 Encounter for immunization: Secondary | ICD-10-CM

## 2016-07-01 DIAGNOSIS — I10 Essential (primary) hypertension: Secondary | ICD-10-CM

## 2016-07-01 DIAGNOSIS — Z794 Long term (current) use of insulin: Secondary | ICD-10-CM

## 2016-07-01 DIAGNOSIS — Z2821 Immunization not carried out because of patient refusal: Secondary | ICD-10-CM

## 2016-07-01 MED ORDER — ASPIRIN 81 MG PO TBEC
81.0000 mg | DELAYED_RELEASE_TABLET | Freq: Every evening | ORAL | 3 refills | Status: DC
Start: 2016-07-01 — End: 2021-11-21

## 2016-07-01 MED ORDER — LISINOPRIL 40 MG PO TABS
40.0000 mg | ORAL_TABLET | Freq: Every evening | ORAL | Status: DC
Start: 2016-07-01 — End: 2017-05-30

## 2016-07-01 MED ORDER — HYDROCHLOROTHIAZIDE 12.5 MG PO TABS
12.5000 mg | ORAL_TABLET | Freq: Every day | ORAL | 1 refills | Status: DC
Start: 2016-07-01 — End: 2016-08-31

## 2016-07-01 NOTE — Patient Instructions (Addendum)
Hypertension:  Continue Lisinopril 40 mg daily.   We will start you on a trial of HCTZ 12.5 mg daily.  Continue to check your blood pressure.  Our goal is less than 140/90.  Diet: low-salt low-cholesterol diet  Avoid nonsteroidal anti-inflammatory medication such as ibuprofen  Weight reduction diet is recommended  Exercise: 40 minutes of aerobic exercise most days the week is recommended by the CDC    Follow up in 2 month with fasting labs. You may go to [facility] or [place] to get these done. [place] [facility] hours are 8-4:30 Monday thru Friday and 8-Noon on Saturdays. I will call you if there is anything that needs immediate attention. Otherwise, we will review these at your next visit.     Lifestyle changes:   . Smoking Cessation   . Control blood glucose and lipids   . Diet   ?Eat healthy (i.e., DASH diet)   ?Moderate alcohol consumption   ?Reduce sodium intake to no more than 2,400 mg/day   .Physical activity   ?Moderate-to-vigorous activity 3-4 days a week averaging 40 min per session.     Diabetes:  Fasting labs prior to your next scheduled appointment with Dr. Candis Schatz on 08/22/2016.    You should follow an ADA diet andexercise on a daily basis. Exercise helps to lower blood sugar.Begin by walking 10 minutes most days. Add more time and speed to your walk. Build up as you feel able. Aim for 3 to 4 sessions of aerobic exercise a week. Each session should last about 40 minutes and include moderate to vigorous physical activity.But, any exercise is good.    You should follow a low-fat, low-cholesterol, low-salt diet and should avoid or limit refined carbohydrates (white rice, white bread, white pasta. Whole-wheat/whole grain or brown rice is okay.      A hemoglobin A1c test should be done every 3 months and a urine microalbuminratio every year.   Your last Hemoglobin A1c was 8.7 on 04/23/2016  Your last Urine microalbumin was 3,425.58 on 02/10/2015     You need to see an ophthalmologist every year. Eye exam-  An Ophthalmologist can get a better view of the back of your eye to look for any damage that is caused from diabetes and hypertension.Your last ophthalmologist visit was on 05/2015.      You need to monitor their feet for pain, burning, decrease sensation, or skin or nail abnormalities. Most diabetics with foot issues see a podiatrist. If you need a referral please let me know.    All diabetics unless contraindicated should be on a statin medication to keep their cholesterol levels low. Their LDL should be below 70,you are on Lipitor 20 mg daily.   You should be on an ACE inhibitor or ARB medication to help keep their BP under 140/90 and to help prevent kidney damage from the diabetes.      Most diabetics are on a diabetic medication.   Your diabetic medication you are currently on are: Novolog 30 units daily and Lantus 20 units in the morning/ 40 units in at night.     You should monitor your blood sugar by a glucometer. A blood sugar diary is of great benefit to monitor the effects or diet, exercise, and medications and should be well documented. Medicare requires a blood sugar diary to be kept if the patient tests the sugar more than once a day. This also helps me provide the best care for you.     Hypoglycemia  is a very dangerous complication of diabetes and you should be aware of the symptoms of low blood sugar, (confusion, moody, numbness and tingling, lightheaded or syncope, trouble concentrating or coordinating activities, etc.) If you becomes hypoglycemic you should drink milk, eat peanut butter crackers, or drink a regular can of soda (not diet). Recheck your blood sugar in 30 minutes. Drinking juice and candy will bring your blood sugar up but it usually will fall because it can not sustain your sugar. Protein helps sustain your blood sugar. Hypoglycemia can be life threatening and driving or operating heavy machinery should be avoid until the danger is well passed.      How to Quit  Smoking  Smoking is one of the hardest habits to break. About half of allpeople who have ever smoked have been able to quit. Most peoplewho still smoke want to quit. Here are some of the best ways to stop smoking.    Keep trying  It takes most smokers about eight tries before they can quit entirely. It's important not to give up.  Go cold [place]  Mostformer smokers quit cold [place] (all at once). Trying to cut back gradually doesn't seem to work as well, perhaps because it continues the smoking habit. Also, it is possible to inhale more while smoking fewer cigarettes. This results in the same amount of nicotine in your body!  Get support  Support programs can be a big help, especially for heavy smokers. These groups offer lectures, ways to change behavior, and peer support. Here are some ways to find a support program:   Free national quitline: 800-QUIT-NOW 209-829-8239).   Hospital quit-smoking programs.   American Lung Association: 256-756-0177).   [place] 816-821-3425).  Support at home is important too. Nonsmokers can offer praise and encouragement. If the smoker in your life finds it hard to quit, encourage them to keep trying!  Over-the-counter medicines  Nicotine replacement therapymay make quittingeasier. Certain aids, such as the nicotine patch, gum, and lozenges, are available without a prescription. Itis best to use these under a doctor's care, though. The skin patch provides a steady supply of nicotine. Nicotine gum and lozenges givetemporary bursts of low levels of nicotine. Both methods reduce the craving for cigarettes. Warning: If youhave nausea, vomiting, dizziness, weakness, or a fast heartbeat, stop using these products and see your doctor.  Prescription medicines  After reviewingyour smoking patterns and prior attempts to quit, your doctor may offer a prescription medicine such as bupropion, varenicline, a nicotine inhaler, or nasal spray. Each has advantages and  side effects. Your doctor can review these with you.  Health benefits of quitting  The benefits of quitting start right away and keep improving the longer you go without smoking.These benefits occur at any age. So whether you are 17 or 70, quitting is a good decision. Some of the benefits include:   20 minutes: Blood pressure and pulse return to normal.   8 hours: Oxygen levels return to normal.   2 days: Ability to smell and taste begin to improve as damaged nerves regrow.   2 to 3 weeks: Circulation and lung function improve.   1 to 9 months: Coughing, congestion, and shortness of breath decrease; tiredness decreases.   1 year: Risk of heart attack decreases by half.   5 years: Risk of lung cancer decreases by half; risk of stroke becomes the same as a nonsmoker's.  For more on how to quit smoking, try these online resources:   Smokefree.gov   "  Clearing the Air" booklet from the [employer]: http://www.ingram.com/.pdf  Date Last Reviewed: 06/11/2013   2000-2016 [employer], [place] [address]. All rights reserved. This information is not intended as a substitute for professional medical care. Always follow your healthcare professional's instructions.

## 2016-07-01 NOTE — Progress Notes (Signed)
PROGRESS NOTE    Date Time: 07/01/2016 6:21 PM  Patient Name: [name], [name]  Primary Care Physician: Rhunette Croft, MD      History of Presenting Illness:   [name] is a 51 y.o. male who presents to the office with   Chief Complaint   Patient presents with   . Hypertension     Patient presents today for a ER follow-up for hypertension. He is currently treated with lisinopril 40 mg daily and asprin 81 mg daily. Does not check his blood pressure reguarly but was undergoing a wellness exam at his work and was noted to have a blood pressure 160s/100s. Associated symptoms include lower extremity edema. Denies headaches, chest pain or shortness of breath.    . Diabetes     He also has diabetes that is managed with novolog 30 units daily and lantus 60 units daily. States his blood sugars run around 120s-150s. States he does have peripheral neuropathy which he takes Neurontin.        Past Medical History:     Past Medical History:   Diagnosis Date   . Complication of anesthesia     last time he was kicking his legs had to wake him up   . Diabetes mellitus    . Diabetic ulcer of left foot 12/26/14   . Disc    . Gastroesophageal reflux disease    . Hyperlipidemia    . Hypertension    . Seasonal allergic rhinitis    . Sleep apnea     not tested   . Type 2 diabetes mellitus, controlled        Past Surgical History:     Past Surgical History:   Procedure Laterality Date   . ARTHRODESIS, TOE Left 05/04/2015    Procedure: ARTHRODESIS, TOE;  Surgeon: Janora Norlander, DPM;  Location: [facility];  Service: Podiatry;  Laterality: Left;  1st MPJ FUSION   . DEBRIDEMENT & IRRIGATION, LOWER EXTREMITY Left 03/12/2015    Procedure: [place], LOWER EXTREMITY;  Surgeon: Janora Norlander, DPM;  [facility];  Service: Podiatry;  Laterality: Left;  I&D LEFT FOOT FOR BONE INFECTION   . DEBRIDEMENT & IRRIGATION, LOWER EXTREMITY Left 03/07/2016    Procedure: [place], LEFT FOOT 2ND TOE, EXCISION OF 2ND METATARSEL;  Surgeon: Janora Norlander, DPM;  Location: [facility];  Service: Podiatry;  Laterality: Left;  I&D Left Foot   . HAND SURGERY         Family History:     Family History   Problem Relation Age of Onset   . Diabetes Mother    . Depression Sister    . COPD Sister    . Hypertension Brother        Social History:     History   Smoking Status   . Current Every Day Smoker   . Packs/day: 0.25   . Types: Cigarettes   Smokeless Tobacco   . Never Used     History   Alcohol Use No     History   Drug Use No       Allergies:     Allergies   Allergen Reactions   . Flu Virus Vaccine Anaphylaxis       Medications:     Prior to Admission medications    Medication Sig Start Date End Date Taking? Authorizing Provider   aspirin EC 81 MG EC tablet Take 1 tablet (81 mg  total) by mouth every evening. 07/01/16  Yes Airianna Kreischer T, NP   atorvastatin (LIPITOR) 20 MG tablet Take 1 tablet (20 mg total) by mouth nightly. 05/09/16  Yes Talene Glastetter T, NP   gabapentin (NEURONTIN) 300 MG capsule Take 1 capsule (300 mg total) by mouth 3 (three) times daily. 03/15/16  Yes Rhunette Croft, MD   insulin aspart (NOVOLOG FLEXPEN) 100 UNIT/ML injection pen Inject per sliding scale. Max 30 units per day. 06/14/16  Yes Rhunette Croft, MD   insulin glargine (LANTUS SOLOSTAR) 100 UNIT/ML injection pen Inject 20 units in the morning and 40 units at bedtime. 06/14/16  Yes Rhunette Croft, MD   lisinopril (PRINIVIL,ZESTRIL) 40 MG tablet Take 1 tablet (40 mg total) by mouth nightly. 07/01/16  Yes Janeece Riggers T, NP   aspirin EC 81 MG EC tablet Take 81 mg by mouth every evening.  07/01/16 Yes [provider]   lisinopril (PRINIVIL,ZESTRIL) 20 MG tablet Take 2 tablets (40 mg total) by mouth nightly. 04/22/16 07/01/16 Yes Rhunette Croft, MD   hydroCHLOROthiazide (HYDRODIURIL) 12.5 MG tablet Take 1 tablet (12.5 mg total) by mouth daily. 07/01/16   Janeece Riggers T, NP    cyclobenzaprine (FLEXERIL) 10 MG tablet Take 1 tablet (10 mg total) by mouth every 8 (eight) hours as needed for Muscle spasms (pain or muscle spasm). 05/27/14 07/01/16  Melynda Keller, PA       Review of Systems:      Constitutional: Negative for fever, fatigue or weight changes.   HENT: Negative for ear pain, congestion, rhinorrhea and neck pain.    Eyes: Negative for discharge, redness and itching  Respiratory: Negative for cough and shortness of breath.    Cardiovascular: Hypertension, see HPI. Negative for chest pain or palpitations.  Gastrointestinal: Negative for nausea, vomiting, heartburn, abdominal pain, changes in bowel habits.  Genitourinary: Negative for dysuria, urgency and difficulty urinating.   Endocrine: Diabetes, see HPI. Negative for polyuria, polydipsia, polyphagia, or heat/cold intolerances.  Musculoskeletal: Negative for joint swelling and gait problem.   Neurological: Negative for dizziness, weakness and headaches.   Skin: Negative for rashes or lesions.  Psychiatric/Behavioral: Negative.      Physical Exam:     Vitals:    07/01/16 1517   BP: 140/80   Pulse: (!) 104   Resp: 16   Temp: 98 F (36.7 C)   SpO2: 98%     Body mass index is 39.32 kg/m.    General:  no acute distress. Pleasant and well groomed.  HEENT: eomi, PERRL, no AV nicking, no exudate, no hemorrhage and no papilledema bilaterally, sclera anicteric, oropharynx clear without lesions, mucous membranes moist  Neck: supple, no lymphadenopathy, no thyromegaly, no JVD, no carotid bruits  Cardiovascular: regular rate and rhythm, no murmurs  Lungs: clear to auscultation bilaterally, without wheezing, rhonchi, or rales  Abdomen: soft, non-tender, non-distended, normoactive bowel sounds in all four quadrants.  Extremities: 1+ pitting edema up to knees bilaterally  Musculoskeletal: Gait WNL. No swelling, redness of joints.   Neuro:  alert and oriented.   Skin: no rashes or lesions noted  Psychiatric/Behavioral: appropriate affect, mood  and behavior.    Assessment:     1. Type 2 diabetes mellitus with hyperglycemia, with long-term current use of insulin    2. Essential hypertension    3. Need for pneumococcal vaccination    4. Refused pneumococcal vaccination    5. Cigarette nicotine dependence without complication  Plan:     Patient Instructions   Hypertension:  Continue Lisinopril 40 mg daily.   We will start you on a trial of HCTZ 12.5 mg daily.  Continue to check your blood pressure.  Our goal is less than 140/90.  Diet: low-salt low-cholesterol diet  Avoid nonsteroidal anti-inflammatory medication such as ibuprofen  Weight reduction diet is recommended  Exercise: 40 minutes of aerobic exercise most days the week is recommended by the CDC    Follow up in 2 month with fasting labs. You may go to [facility] or [place] to get these done. [place] [facility] hours are 8-4:30 Monday thru Friday and 8-Noon on Saturdays. I will call you if there is anything that needs immediate attention. Otherwise, we will review these at your next visit.     Lifestyle changes:   . Smoking Cessation   . Control blood glucose and lipids   . Diet   ?Eat healthy (i.e., DASH diet)   ?Moderate alcohol consumption   ?Reduce sodium intake to no more than 2,400 mg/day   .Physical activity   ?Moderate-to-vigorous activity 3-4 days a week averaging 40 min per session.     Diabetes:  Fasting labs prior to your next scheduled appointment with Dr. Candis Schatz on 08/22/2016.    You should follow an ADA diet andexercise on a daily basis. Exercise helps to lower blood sugar.Begin by walking 10 minutes most days. Add more time and speed to your walk. Build up as you feel able. Aim for 3 to 4 sessions of aerobic exercise a week. Each session should last about 40 minutes and include moderate to vigorous physical activity.But, any exercise is good.    You should follow a low-fat, low-cholesterol, low-salt diet and should avoid or limit refined carbohydrates (white rice, white bread, white  pasta. Whole-wheat/whole grain or brown rice is okay.      A hemoglobin A1c test should be done every 3 months and a urine microalbuminratio every year.   Your last Hemoglobin A1c was 8.7 on 04/23/2016  Your last Urine microalbumin was 3,425.58 on 02/10/2015     You need to see an ophthalmologist every year. Eye exam- An Ophthalmologist can get a better view of the back of your eye to look for any damage that is caused from diabetes and hypertension.Your last ophthalmologist visit was on 05/2015.      You need to monitor their feet for pain, burning, decrease sensation, or skin or nail abnormalities. Most diabetics with foot issues see a podiatrist. If you need a referral please let me know.    All diabetics unless contraindicated should be on a statin medication to keep their cholesterol levels low. Their LDL should be below 70,you are on Lipitor 20 mg daily.   You should be on an ACE inhibitor or ARB medication to help keep their BP under 140/90 and to help prevent kidney damage from the diabetes.      Most diabetics are on a diabetic medication.   Your diabetic medication you are currently on are: Novolog 30 units daily and Lantus 20 units in the morning/ 40 units in at night.     You should monitor your blood sugar by a glucometer. A blood sugar diary is of great benefit to monitor the effects or diet, exercise, and medications and should be well documented. Medicare requires a blood sugar diary to be kept if the patient tests the sugar more than once a day. This also helps me provide the  best care for you.     Hypoglycemia is a very dangerous complication of diabetes and you should be aware of the symptoms of low blood sugar, (confusion, moody, numbness and tingling, lightheaded or syncope, trouble concentrating or coordinating activities, etc.) If you becomes hypoglycemic you should drink milk, eat peanut butter crackers, or drink a regular can of soda (not diet). Recheck your blood sugar in 30  minutes. Drinking juice and candy will bring your blood sugar up but it usually will fall because it can not sustain your sugar. Protein helps sustain your blood sugar. Hypoglycemia can be life threatening and driving or operating heavy machinery should be avoid until the danger is well passed.      How to Quit Smoking  Smoking is one of the hardest habits to break. About half of allpeople who have ever smoked have been able to quit. Most peoplewho still smoke want to quit. Here are some of the best ways to stop smoking.    Keep trying  It takes most smokers about eight tries before they can quit entirely. It's important not to give up.  Go cold [place]  Mostformer smokers quit cold [place] (all at once). Trying to cut back gradually doesn't seem to work as well, perhaps because it continues the smoking habit. Also, it is possible to inhale more while smoking fewer cigarettes. This results in the same amount of nicotine in your body!  Get support  Support programs can be a big help, especially for heavy smokers. These groups offer lectures, ways to change behavior, and peer support. Here are some ways to find a support program:   Free national quitline: 800-QUIT-NOW 3430248995).   Hospital quit-smoking programs.   American Lung Association: 825-850-6457).   [place] ([phone]).  Support at home is important too. Nonsmokers can offer praise and encouragement. If the smoker in your life finds it hard to quit, encourage them to keep trying!  Over-the-counter medicines  Nicotine replacement therapymay make quittingeasier. Certain aids, such as the nicotine patch, gum, and lozenges, are available without a prescription. Itis best to use these under a doctor's care, though. The skin patch provides a steady supply of nicotine. Nicotine gum and lozenges givetemporary bursts of low levels of nicotine. Both methods reduce the craving for cigarettes. Warning: If youhave nausea, vomiting,  dizziness, weakness, or a fast heartbeat, stop using these products and see your doctor.  Prescription medicines  After reviewingyour smoking patterns and prior attempts to quit, your doctor may offer a prescription medicine such as bupropion, varenicline, a nicotine inhaler, or nasal spray. Each has advantages and side effects. Your doctor can review these with you.  Health benefits of quitting  The benefits of quitting start right away and keep improving the longer you go without smoking.These benefits occur at any age. So whether you are 17 or 70, quitting is a good decision. Some of the benefits include:   20 minutes: Blood pressure and pulse return to normal.   8 hours: Oxygen levels return to normal.   2 days: Ability to smell and taste begin to improve as damaged nerves regrow.   2 to 3 weeks: Circulation and lung function improve.   1 to 9 months: Coughing, congestion, and shortness of breath decrease; tiredness decreases.   1 year: Risk of heart attack decreases by half.   5 years: Risk of lung cancer decreases by half; risk of stroke becomes the same as a nonsmoker's.  For more on how to  quit smoking, try these online resources:   Smokefree.gov   "Clearing the Air" booklet from the [employer]: http://www.ingram.com/.pdf  Date Last Reviewed: 06/11/2013   2000-2016 [employer], [place] [address]. All rights reserved. This information is not intended as a substitute for professional medical care. Always follow your healthcare professional's instructions.       Requested Prescriptions     Signed Prescriptions Disp Refills   . aspirin EC 81 MG EC tablet 90 tablet 3     Sig: Take 1 tablet (81 mg total) by mouth every evening.   Marland Kitchen lisinopril (PRINIVIL,ZESTRIL) 40 MG tablet       Sig: Take 1 tablet (40 mg total) by mouth nightly.   . hydroCHLOROthiazide (HYDRODIURIL) 12.5 MG tablet 30 tablet 1     Sig: Take  1 tablet (12.5 mg total) by mouth daily.        Patient was counseled on possible medicine side effects which may include rash, swelling and/or stomach upset.  Patient was instructed to notify me or the ER if they experience problems.    Orders Placed This Encounter   Procedures   . Pneumococcal polysaccharide vaccine 23-valent greater than or equal to 2yo subcutaneous/IM   . Comprehensive metabolic panel     Standing Status:   Future     Standing Expiration Date:   10/14/2017     Order Specific Question:   Has the patient fasted?     Answer:   Yes   . Hemoglobin A1C     Standing Status:   Future     Standing Expiration Date:   10/14/2017   . Lipid panel     Standing Status:   Future     Standing Expiration Date:   10/14/2017     Order Specific Question:   Has the patient fasted?     Answer:   Yes   . Microalbumin / Creatinine Ratio     Standing Status:   Future     Standing Expiration Date:   10/14/2017        Before leaving the office, the patient was informed of all ordered diagnostic tests and consults.     If you have not heard back from [place] about test results in 14 days, please call the office at [phone].    Signed by: Sindy Messing, NP    Supervising Doctor: Verne Spurr, MD

## 2016-07-22 ENCOUNTER — Ambulatory Visit (RURAL_HEALTH_CENTER): Payer: BC Managed Care – PPO | Admitting: Family Medicine

## 2016-07-25 ENCOUNTER — Encounter (RURAL_HEALTH_CENTER): Payer: Self-pay

## 2016-08-11 ENCOUNTER — Other Ambulatory Visit (RURAL_HEALTH_CENTER): Payer: Self-pay | Admitting: Nurse Practitioner

## 2016-08-19 ENCOUNTER — Ambulatory Visit: Payer: BC Managed Care – PPO | Attending: Physician Assistant | Admitting: Physician Assistant

## 2016-08-19 VITALS — BP 108/64 | HR 93 | Temp 98.9°F

## 2016-08-19 DIAGNOSIS — K047 Periapical abscess without sinus: Secondary | ICD-10-CM

## 2016-08-19 MED ORDER — AMOXICILLIN-POT CLAVULANATE 875-125 MG PO TABS
1.0000 | ORAL_TABLET | Freq: Two times a day (BID) | ORAL | 0 refills | Status: AC
Start: 2016-08-19 — End: 2016-08-29

## 2016-08-19 MED ORDER — DICLOFENAC SODIUM 50 MG PO TBEC
50.0000 mg | DELAYED_RELEASE_TABLET | Freq: Two times a day (BID) | ORAL | 0 refills | Status: DC | PRN
Start: 2016-08-19 — End: 2016-09-20

## 2016-08-19 NOTE — Progress Notes (Signed)
Subjective:    Patient ID: [name] is a 51 y.o. male.    Dental Pain    This is a new problem. The current episode started yesterday. The problem occurs constantly. The problem has been gradually worsening. The pain is severe. Associated symptoms include facial pain and sinus pressure. Pertinent negatives include no difficulty swallowing, fever, oral bleeding or thermal sensitivity. He has tried acetaminophen and NSAIDs for the symptoms. The treatment provided no relief.       The following portions of the patient's history were reviewed and updated as appropriate: allergies, current medications, past medical history and problem list.    Review of Systems   Constitutional: Negative for fatigue and fever.   HENT: Positive for dental problem and sinus pressure. Negative for sore throat, trouble swallowing and voice change.            Objective:    Physical Exam   Constitutional: He appears well-developed and well-nourished. No distress.   HENT:   Head: Normocephalic and atraumatic.   Right Ear: External ear normal.   Left Ear: External ear normal.   Mouth/Throat: Uvula is midline and oropharynx is clear and moist. No trismus in the jaw. Dental abscesses (left  incisor and periorbital region with slight abscess. no drainage.) present.   Eyes: Conjunctivae and EOM are normal. No scleral icterus.   Neck: Neck supple.   Cardiovascular: Normal rate.    Pulmonary/Chest: Effort normal. No respiratory distress.   Neurological: He is alert.   Skin: Skin is warm and dry. Capillary refill takes less than 2 seconds. No rash noted. He is not diaphoretic. No erythema. No pallor.   Psychiatric: He has a normal mood and affect. His behavior is normal. Judgment and thought content normal.     Vitals:    08/19/16 1732   BP: 108/64   Pulse: 93   Temp: 98.9 F (37.2 C)   SpO2: 97%           Assessment:       1. Dental abscess          Plan:       1. Dental abscess  - amoxicillin-clavulanate (AUGMENTIN) 875-125 MG per tablet;  Take 1 tablet by mouth 2 (two) times daily.for 10 days  Dispense: 20 tablet; Refill: 0  - diclofenac (VOLTAREN) 50 MG EC tablet; Take 1 tablet (50 mg total) by mouth 2 (two) times daily as needed (pain).  Dispense: 10 tablet; Refill: 0  Short course NSAID given htn and DMII.   - Discussed over-the-counter options as well as educated on therapies advised.  - discussed signs and symptoms that warrant immediate medical attention in the ER, patient clearly understands.  - Follow up with primary care doctor within the next week or sooner if symptoms are not improving.    Signed: Abdulahad Mederos PA-C, 08/19/2016, 7:57 PM

## 2016-08-22 ENCOUNTER — Ambulatory Visit (RURAL_HEALTH_CENTER): Payer: BC Managed Care – PPO | Admitting: Family Medicine

## 2016-08-31 ENCOUNTER — Other Ambulatory Visit (RURAL_HEALTH_CENTER): Payer: Self-pay | Admitting: Nurse Practitioner

## 2016-08-31 DIAGNOSIS — I10 Essential (primary) hypertension: Secondary | ICD-10-CM

## 2016-09-15 ENCOUNTER — Other Ambulatory Visit
Admission: RE | Admit: 2016-09-15 | Discharge: 2016-09-15 | Disposition: A | Discharge status: Home / Self Care | Payer: BC Managed Care – PPO | Source: Ambulatory Visit | Attending: Nurse Practitioner | Admitting: Nurse Practitioner

## 2016-09-15 DIAGNOSIS — E1165 Type 2 diabetes mellitus with hyperglycemia: Secondary | ICD-10-CM

## 2016-09-15 DIAGNOSIS — Z794 Long term (current) use of insulin: Secondary | ICD-10-CM

## 2016-09-15 DIAGNOSIS — I10 Essential (primary) hypertension: Secondary | ICD-10-CM

## 2016-09-15 LAB — VH MICROALBUMIN / CREATININE RATIO
Microalbumin-Random, U: >5000 ug/mL — ABNORMAL HIGH (ref 0.00–20.00)
Microalbumin/Creatinine Ratio: 4745
Urine Creatinine Random: 105.38 mg/dL

## 2016-09-15 LAB — COMPREHENSIVE METABOLIC PANEL
ALT: 31 U/L (ref 0–55)
AST (SGOT): 19 U/L (ref 10–42)
Albumin/Globulin Ratio: 1.15 Ratio (ref 0.70–1.50)
Albumin: 3.1 gm/dL — ABNORMAL LOW (ref 3.5–5.0)
Alkaline Phosphatase: 92 U/L (ref 40–145)
Anion Gap: 12.3 mMol/L (ref 7.0–18.0)
BUN / Creatinine Ratio: 21.4 Ratio (ref 10.0–30.0)
BUN: 24 mg/dL — ABNORMAL HIGH (ref 7–22)
Bilirubin, Total: 0.4 mg/dL (ref 0.1–1.2)
CO2: 25.7 mMol/L (ref 20.0–30.0)
Calcium: 9.2 mg/dL (ref 8.5–10.5)
Chloride: 106 mMol/L (ref 98–110)
Creatinine: 1.12 mg/dL (ref 0.80–1.30)
EGFR: 75 mL/min/{1.73_m2} (ref 60–150)
Globulin: 2.7 gm/dL (ref 2.0–4.0)
Glucose: 127 mg/dL — ABNORMAL HIGH (ref 71–99)
Osmolality Calc: 285 mOsm/kg (ref 275–300)
Potassium: 4 mMol/L (ref 3.5–5.3)
Protein, Total: 5.8 gm/dL — ABNORMAL LOW (ref 6.0–8.3)
Sodium: 140 mMol/L (ref 136–147)

## 2016-09-15 LAB — LIPID PANEL
Cholesterol: 202 mg/dL — ABNORMAL HIGH (ref 75–199)
Coronary Heart Disease Risk: 5.77
HDL: 35 mg/dL — ABNORMAL LOW (ref 40–55)
LDL Calculated: UNDETERMINED mg/dL
Triglycerides: 533 mg/dL — ABNORMAL HIGH (ref 10–150)
VLDL: UNDETERMINED (ref 0–40)

## 2016-09-15 LAB — HEMOGLOBIN A1C: Hgb A1C, %: 9.2 %

## 2016-09-20 ENCOUNTER — Ambulatory Visit: Payer: BC Managed Care – PPO | Attending: Family Medicine | Admitting: Family Medicine

## 2016-09-20 ENCOUNTER — Encounter (RURAL_HEALTH_CENTER): Payer: Self-pay | Admitting: Family Medicine

## 2016-09-20 VITALS — BP 124/80 | HR 100 | Temp 97.8°F | Resp 16 | Ht 66.0 in | Wt 247.6 lb

## 2016-09-20 DIAGNOSIS — Z794 Long term (current) use of insulin: Secondary | ICD-10-CM

## 2016-09-20 DIAGNOSIS — I10 Essential (primary) hypertension: Secondary | ICD-10-CM

## 2016-09-20 DIAGNOSIS — E1165 Type 2 diabetes mellitus with hyperglycemia: Secondary | ICD-10-CM

## 2016-09-20 DIAGNOSIS — E781 Pure hyperglyceridemia: Secondary | ICD-10-CM

## 2016-09-20 MED ORDER — LIRAGLUTIDE 18 MG/3ML SC SOPN
PEN_INJECTOR | SUBCUTANEOUS | 3 refills | Status: DC
Start: 2016-09-20 — End: 2017-06-26

## 2016-09-20 MED ORDER — ATORVASTATIN CALCIUM 40 MG PO TABS
40.0000 mg | ORAL_TABLET | Freq: Every day | ORAL | 3 refills | Status: DC
Start: 2016-09-20 — End: 2017-10-17

## 2016-09-20 NOTE — Progress Notes (Signed)
PROGRESS NOTE    History of Presenting Illness:   [name] is a 51 y.o. male who presents to the office with   Chief Complaint   Patient presents with   . Hypertension   . Diabetes     HTN- Reports compliance with medications and diet. Present medications include hydrochlorothiazide 12.5 mg, lisinopril 40 mg po QD. Experiencing no medication side effects. Denies: chest pain, headache, focal neurological symptoms, shortness of breath, epistaxis, dizziness, peripheral edema.  BP today in office was 124/80.    Diabetes- Patient reports compliance with medication.  Present medications include lantus 25 units in the morning and 40 units at night, novolog 7-10 units TID at meals.  The last eye exam was very recent and ophthalmologist will put results in the computer.  The patient is taking a daily aspirin, and ACEI or ARB, and a statin.  Denies polyuria, polydipsia, polyphagia, weight change, blurred vision.  Patient is monitoring fingerstick glucose and has readings of 200s in the mornings.  When pt got out of hospital he had low readings in the 70s and did not feel well.  No history of diabetic retinopathy, diabetic nephropathy, diabetic neuropathy, diabetic gastroparesis.  Hgb A1C from 09/15/16 was 9.2 and 8.7 from 04/23/16.  Pt had microalbumin random of over 5000 from 09/15/16.    Hyperlipidemia - patient is presently on lipitor 20 mg.  Reports compliance with medications.  He denies chest pain or claudication.  No medication side effects including: No malaise/fatigue, icterus, abdominal pain, nausea, acholic stools, myalgia.  Lipid panel from 09/15/16 had readings of cholesterol 202, 533 for triglycerides, HDL of 35.     Past Medical History:     Past Medical History:   Diagnosis Date   . Complication of anesthesia     last time he was kicking his legs had to wake him up   . Diabetes mellitus    . Diabetic ulcer of left foot 12/26/14   . Disc    . Gastroesophageal reflux disease    . Hyperlipidemia    .  Hypertension    . Seasonal allergic rhinitis    . Sleep apnea     not tested   . Type 2 diabetes mellitus, controlled      Past Surgical History:     Past Surgical History:   Procedure Laterality Date   . ARTHRODESIS, TOE Left 05/04/2015    Procedure: ARTHRODESIS, TOE;  Surgeon: Janora Norlander, DPM;  Location: [facility];  Service: Podiatry;  Laterality: Left;  1st MPJ FUSION   . DEBRIDEMENT & IRRIGATION, LOWER EXTREMITY Left 03/12/2015    Procedure: [place], LOWER EXTREMITY;  Surgeon: Janora Norlander, DPM;  [facility];  Service: Podiatry;  Laterality: Left;  I&D LEFT FOOT FOR BONE INFECTION   . DEBRIDEMENT & IRRIGATION, LOWER EXTREMITY Left 03/07/2016    Procedure: [place], LEFT FOOT 2ND TOE, EXCISION OF 2ND METATARSEL;  Surgeon: Janora Norlander, DPM;  Location: [facility];  Service: Podiatry;  Laterality: Left;  I&D Left Foot   . HAND SURGERY       Family History:     Family History   Problem Relation Age of Onset   . Diabetes Mother    . Depression Sister    . COPD Sister    . Hypertension Brother        Social History:     History   Smoking Status   . Former Smoker   .  Packs/day: 0.25   . Types: Cigarettes   Smokeless Tobacco   . Never Used     History   Alcohol Use No     History   Drug Use No       Allergies:     Allergies   Allergen Reactions   . Flu Virus Vaccine Anaphylaxis       Medications:     Prior to Admission medications    Medication Sig Start Date End Date Taking? Authorizing Provider   aspirin EC 81 MG EC tablet Take 1 tablet (81 mg total) by mouth every evening. 07/01/16  Yes Sharp, Skyler T, NP   atorvastatin (LIPITOR) 20 MG tablet TAKE 1 TABLET BY MOUTH NIGHTLY 08/16/16  Yes Rhunette Croft, MD   gabapentin (NEURONTIN) 300 MG capsule Take 1 capsule (300 mg total) by mouth 3 (three) times daily. 03/15/16  Yes Rhunette Croft, MD   hydroCHLOROthiazide (HYDRODIURIL) 12.5 MG tablet TAKE 1 TABLET BY  MOUTH ONCE DAILY 09/02/16  Yes Rhunette Croft, MD   insulin aspart (NOVOLOG FLEXPEN) 100 UNIT/ML injection pen Inject per sliding scale. Max 30 units per day. 06/14/16  Yes Rhunette Croft, MD   insulin glargine (LANTUS SOLOSTAR) 100 UNIT/ML injection pen Inject 20 units in the morning and 40 units at bedtime. 06/14/16  Yes Rhunette Croft, MD   lisinopril (PRINIVIL,ZESTRIL) 40 MG tablet Take 1 tablet (40 mg total) by mouth nightly. 07/01/16  Yes Sharp, Skyler T, NP   diclofenac (VOLTAREN) 50 MG EC tablet Take 1 tablet (50 mg total) by mouth 2 (two) times daily as needed (pain). 08/19/16 09/20/16  Cardillo, Magdalene Molly, PA       Review of Systems:      Review of Systems   Constitutional: Negative.    HENT: Negative.    Eyes: Negative.    Respiratory: Negative.    Cardiovascular: Negative.    Gastrointestinal: Negative.    Genitourinary: Negative.    Musculoskeletal: Negative.    Skin: Negative.    Neurological: Negative.    Endo/Heme/Allergies: Negative.    Psychiatric/Behavioral: Negative.      Physical Exam:     Vitals:    09/20/16 1532   BP: 124/80   Pulse: 100   Resp: 16   Temp: 97.8 F (36.6 C)   SpO2: 95%     Body mass index is 39.96 kg/m.    Physical Exam   Constitutional: He is oriented to person, place, and time. He appears well-developed and well-nourished.   HENT:   Head: Normocephalic and atraumatic.   Neck: Neck supple. No JVD present. Carotid bruit is not present. No thyromegaly present.   No thyroid nodules.  No lymphadenopathy.   Cardiovascular: Normal rate, regular rhythm and normal heart sounds.  Exam reveals no gallop and no friction rub.    No murmur heard.  Pulmonary/Chest: Effort normal and breath sounds normal. No respiratory distress. He has no wheezes. He has no rales.   Abdominal: Soft. Bowel sounds are normal. He exhibits no distension and no mass. There is no tenderness. There is no rebound and no guarding.   No organomegaly.   Musculoskeletal: He exhibits no edema or deformity.    Neurological: He is alert and oriented to person, place, and time.   Skin: Skin is warm and dry. Capillary refill takes less than 2 seconds. No rash noted. He is not diaphoretic. No erythema.   Psychiatric: He has a normal mood and affect. His behavior is  normal. Judgment and thought content normal.   Vitals reviewed.    Assessment:     1. Type 2 diabetes mellitus with hyperglycemia, with long-term current use of insulin    2. Essential hypertension    3. Hypertriglyceridemia      Plan:   No orders of the defined types were placed in this encounter.    Requested Prescriptions     Signed Prescriptions Disp Refills   . liraglutide (VICTOZA) 18 MG/3ML injection 15 mL 3     Sig: 0.6mg  daily for 1 week, then increase to 1.2mg  daily   . atorvastatin (LIPITOR) 40 MG tablet 90 tablet 3     Sig: Take 1 tablet (40 mg total) by mouth daily.       Patient Instructions   Start victoza 0.6 mg for week 1, then after first week increase to 1.2 mg  Increase lantus to 30 units in the morning and continue 40 units at night  Continue novolog  Pick up glucose tablets if your blood sugar gets too low    Increase lipitor to 40 mg po QD  Monitor carbohydrate intake in diet  Drink plenty of water    Get Hgb A1C before next appointment  Follow up in 3 months       Patient was counseled on possible medicine side effects which may include rash, swelling and/or stomach upset.  Patient was instructed to notify me or the ER if they experience problems.    Discussed with patient all diagnostics and consults ordered and addressed patient concerns prior to the completion of the visit.  Patient will be notified of results when they become available.  Encouraged to call for results if no contact within 14 days.     The patient's electronic medical record was reviewed, any changes in the past medical history, past surgical history, medications, diagnostic tests were noted, and the record was updated accordingly.    Discussed option available for my  chart which provides electronic access to diagnostic results.      Scribed for Dr. Verne Spurr by Lucillie Garfinkel, Medical Scribe.  09/20/2016 4:17 PM    I, Dr. Verne Spurr, personally performed the services described in this documentation, as scribed by, Fara Olden Nochimson, in my presence. I agree with this documentation and it is both accurate and complete.

## 2016-09-20 NOTE — Patient Instructions (Signed)
Start victoza 0.6 mg for week 1, then after first week increase to 1.2 mg  Increase lantus to 30 units in the morning and continue 40 units at night  Continue novolog  Pick up glucose tablets if your blood sugar gets too low    Increase lipitor to 40 mg po QD  Monitor carbohydrate intake in diet  Drink plenty of water    Get Hgb A1C before next appointment  Follow up in 3 months

## 2016-11-01 ENCOUNTER — Other Ambulatory Visit (RURAL_HEALTH_CENTER): Payer: Self-pay | Admitting: Family Medicine

## 2016-11-01 DIAGNOSIS — I10 Essential (primary) hypertension: Secondary | ICD-10-CM

## 2016-12-19 ENCOUNTER — Other Ambulatory Visit
Admission: RE | Admit: 2016-12-19 | Discharge: 2016-12-19 | Disposition: A | Discharge status: Home / Self Care | Payer: BC Managed Care – PPO | Source: Ambulatory Visit | Attending: Family Medicine | Admitting: Family Medicine

## 2016-12-19 ENCOUNTER — Other Ambulatory Visit (RURAL_HEALTH_CENTER): Payer: Self-pay | Admitting: Family Medicine

## 2016-12-19 DIAGNOSIS — Z794 Long term (current) use of insulin: Secondary | ICD-10-CM

## 2016-12-19 DIAGNOSIS — E1142 Type 2 diabetes mellitus with diabetic polyneuropathy: Secondary | ICD-10-CM

## 2016-12-20 ENCOUNTER — Ambulatory Visit (RURAL_HEALTH_CENTER): Payer: BC Managed Care – PPO | Admitting: Family Medicine

## 2016-12-20 LAB — HEMOGLOBIN A1C: Hgb A1C, %: 11.1 %

## 2016-12-21 ENCOUNTER — Other Ambulatory Visit (RURAL_HEALTH_CENTER): Payer: Self-pay | Admitting: Family Medicine

## 2016-12-22 ENCOUNTER — Other Ambulatory Visit (RURAL_HEALTH_CENTER): Payer: Self-pay | Admitting: Family Medicine

## 2016-12-26 ENCOUNTER — Ambulatory Visit (RURAL_HEALTH_CENTER): Payer: BC Managed Care – PPO | Admitting: Family Medicine

## 2017-01-04 ENCOUNTER — Encounter (RURAL_HEALTH_CENTER): Payer: Self-pay | Admitting: Physician Assistant

## 2017-01-04 ENCOUNTER — Ambulatory Visit: Payer: BC Managed Care – PPO | Attending: Physician Assistant | Admitting: Physician Assistant

## 2017-01-04 VITALS — BP 134/74 | HR 94 | Temp 98.3°F | Wt 252.0 lb

## 2017-01-04 DIAGNOSIS — K047 Periapical abscess without sinus: Secondary | ICD-10-CM

## 2017-01-04 MED ORDER — AMOXICILLIN-POT CLAVULANATE 875-125 MG PO TABS
1.0000 | ORAL_TABLET | Freq: Two times a day (BID) | ORAL | 0 refills | Status: DC
Start: 2017-01-04 — End: 2017-01-04

## 2017-01-04 MED ORDER — AMOXICILLIN-POT CLAVULANATE 875-125 MG PO TABS
1.0000 | ORAL_TABLET | Freq: Two times a day (BID) | ORAL | 0 refills | Status: AC
Start: 2017-01-04 — End: 2017-01-14

## 2017-01-04 NOTE — Progress Notes (Signed)
Subjective:    Patient ID: [name] is a 51 y.o. male.    Dental Pain    This is a recurrent problem. The current episode started yesterday. The problem occurs constantly. The problem has been gradually worsening. The pain is mild. Pertinent negatives include no difficulty swallowing, facial pain, fever or sinus pressure. He has tried NSAIDs for the symptoms. The treatment provided moderate relief.     The following portions of the patient's history were reviewed and updated as appropriate: allergies, current medications, past medical history, past social history and problem list.    Review of Systems   Constitutional: Negative for fatigue and fever.   HENT: Negative for sinus pressure.            Objective:    Physical Exam   Constitutional: He appears well-developed and well-nourished. No distress.   HENT:   Head: Normocephalic and atraumatic.   Right Ear: External ear normal.   Left Ear: External ear normal.   Mouth/Throat: Oral lesions present. No trismus in the jaw. Dental abscesses present.       There is no maxillary or mandibular tenderness.    Eyes: Conjunctivae and EOM are normal. No scleral icterus.   Neck: Neck supple.   Cardiovascular: Normal rate.    Pulmonary/Chest: Effort normal. No respiratory distress.   Neurological: He is alert.   Skin: Skin is warm and dry. Capillary refill takes less than 2 seconds. No rash noted. He is not diaphoretic. No erythema. No pallor.   Psychiatric: He has a normal mood and affect. His behavior is normal. Judgment and thought content normal.     Vitals:    01/04/17 1708   BP: 134/74   BP Site: Left arm   Patient Position: Sitting   Cuff Size: Large   Pulse: 94   Temp: 98.3 F (36.8 C)   TempSrc: Oral   SpO2: 97%   Weight: 114.3 kg (252 lb)           Assessment:       1. Abscess, dental          Plan:       1. Abscess, dental  - amoxicillin-clavulanate (AUGMENTIN) 875-125 MG per tablet; Take 1 tablet by mouth 2 (two) times daily.for 10 days  Dispense: 20  tablet; Refill: 0    Prefers the same he had last time.   Again advised he get the teeth out and follow up with dentist.     - Discussed over-the-counter options as well as educated on therapies advised.  - discussed signs and symptoms that warrant immediate medical attention in the ER, patient clearly understands.  - Follow up with primary care doctor within the next week or sooner if symptoms are not improving.    Signed: Dacota Devall PA-C, 01/04/2017, 5:26 PM

## 2017-01-04 NOTE — Addendum Note (Signed)
Addended by: Elspeth Cho, Sanai Frick C on: 01/04/2017 06:26 PM     Modules accepted: Orders

## 2017-01-16 ENCOUNTER — Ambulatory Visit: Payer: BC Managed Care – HMO | Attending: Family Medicine | Admitting: Family Medicine

## 2017-01-16 VITALS — BP 130/84 | HR 106 | Temp 97.7°F | Ht 66.0 in | Wt 250.8 lb

## 2017-01-16 DIAGNOSIS — I10 Essential (primary) hypertension: Secondary | ICD-10-CM

## 2017-01-16 DIAGNOSIS — L0291 Cutaneous abscess, unspecified: Secondary | ICD-10-CM

## 2017-01-16 DIAGNOSIS — Z794 Long term (current) use of insulin: Secondary | ICD-10-CM

## 2017-01-16 DIAGNOSIS — L821 Other seborrheic keratosis: Secondary | ICD-10-CM

## 2017-01-16 DIAGNOSIS — E1142 Type 2 diabetes mellitus with diabetic polyneuropathy: Secondary | ICD-10-CM

## 2017-01-16 DIAGNOSIS — K047 Periapical abscess without sinus: Secondary | ICD-10-CM

## 2017-01-16 MED ORDER — CLINDAMYCIN HCL 150 MG PO CAPS
150.0000 mg | ORAL_CAPSULE | Freq: Three times a day (TID) | ORAL | 0 refills | Status: DC
Start: 2017-01-16 — End: 2017-02-08

## 2017-01-16 NOTE — Progress Notes (Signed)
PROGRESS NOTE    History of Presenting Illness:   [name] is a 51 y.o. male who presents to the office with   Chief Complaint   Patient presents with   . Hypertension     lab review    . Diabetes     sugars are fluciating (morning worse-+300 readings) lunch readings 120    . Hyperlipidemia     HTN- Reports compliance with medications and diet. Present medications include hydrochlorothiazide 12.5 mg po QD, lisinopril 40 mg po QD. Experiencing no medication side effects. Denies: chest pain, headache, focal neurological symptoms, shortness of breath, epistaxis, dizziness, peripheral edema.  BP today in office was 130/84.    Diabetes- Patient reports compliance with diet and medication.  Present medications include lantus 30 units in the morning and 40 units at bedtime, victoza 1.2 mg injection QD, novolog sliding scale 10 units TID.  The last eye exam was a few months ago that was unremarkable.  The patient is taking a daily aspirin, and ACEI or ARB, and a statin.  Denies polyuria, polydipsia, polyphagia, weight change, blurred vision.  Patient is monitoring fingerstick glucose and has readings of 300s in the morning and 120s at lunch.  No history of diabetic retinopathy, diabetic nephropathy, diabetic neuropathy, diabetic gastroparesis.  Hgb A1C from 12/19/16 was 11.1.  Discussed referral for endocrinologist.     Hyperlipidemia- patient is presently on lipitor 40 mg po QD.  Reports compliance with medications and diet.  He denies chest pain or claudication.  No medication side effects including: No malaise/fatigue, icterus, abdominal pain, nausea, acholic stools, myalgia.    Upper gum infection- Pt states he has gum infection for a few months that has persisted even with course of augmentin.  Discussed plan to stop augmentin and start clindamycin.    Skin lesion- Pt complains of skin lesion to left side of head that is causing him some distress.  Discussed plan to freeze off lesion in office today and pt  agreed.  Procedure note-  Skin lesion visualized on scalp.  Using liquid nitrogen lesion was frozen and thawed in 3 cycles.  Pt tolerated procedure well.  Advised pt lesion will scab up and freeze off.    Past Medical History:     Past Medical History:   Diagnosis Date   . Complication of anesthesia     last time he was kicking his legs had to wake him up   . Diabetes mellitus    . Diabetic ulcer of left foot 12/26/14   . Disc    . Gastroesophageal reflux disease    . Hyperlipidemia    . Hypertension    . Seasonal allergic rhinitis    . Sleep apnea     not tested   . Type 2 diabetes mellitus, controlled        Past Surgical History:     Past Surgical History:   Procedure Laterality Date   . ARTHRODESIS, TOE Left 05/04/2015    Procedure: ARTHRODESIS, TOE;  Surgeon: Janora Norlander, DPM;  Location: [facility];  Service: Podiatry;  Laterality: Left;  1st MPJ FUSION   . DEBRIDEMENT & IRRIGATION, LOWER EXTREMITY Left 03/12/2015    Procedure: [place], LOWER EXTREMITY;  Surgeon: Janora Norlander, DPM;  Location[facility];  Service: Podiatry;  Laterality: Left;  I&D LEFT FOOT FOR BONE INFECTION   . DEBRIDEMENT & IRRIGATION, LOWER EXTREMITY Left 03/07/2016    Procedure: DEBRIDEMENT & IRRIGATION, LEFT FOOT  2ND TOE, EXCISION OF 2ND METATARSEL;  Surgeon: Janora Norlander, DPM;  Location: [facility];  Service: Podiatry;  Laterality: Left;  I&D Left Foot   . HAND SURGERY         Family History:     Family History   Problem Relation Age of Onset   . Diabetes Mother    . Depression Sister    . COPD Sister    . Hypertension Brother        Social History:     History   Smoking Status   . Former Smoker   . Packs/day: 0.25   . Types: Cigarettes   Smokeless Tobacco   . Never Used     History   Alcohol Use No     History   Drug Use No       Allergies:     Allergies   Allergen Reactions   . Flu Virus Vaccine Anaphylaxis       Medications:     Prior to Admission medications     Medication Sig Start Date End Date Taking? Authorizing Provider   aspirin EC 81 MG EC tablet Take 1 tablet (81 mg total) by mouth every evening. 07/01/16   Janeece Riggers T, NP   atorvastatin (LIPITOR) 40 MG tablet Take 1 tablet (40 mg total) by mouth daily. 09/20/16   Rhunette Croft, MD   gabapentin (NEURONTIN) 300 MG capsule Take 1 capsule (300 mg total) by mouth 3 (three) times daily. 03/15/16   Rhunette Croft, MD   hydroCHLOROthiazide (HYDRODIURIL) 12.5 MG tablet TAKE 1 TABLET BY MOUTH ONCE DAILY 11/04/16   Rhunette Croft, MD   insulin aspart (NOVOLOG FLEXPEN) 100 UNIT/ML injection pen Inject per sliding scale. Max 30 units per day. 06/14/16   Rhunette Croft, MD   insulin glargine (LANTUS SOLOSTAR) 100 UNIT/ML injection pen INJECT 20 UNITS IN THE MORNING AND 40 UNITS AT BEDTIME 12/23/16   Janeece Riggers T, NP   liraglutide (VICTOZA) 18 MG/3ML injection 0.6mg  daily for 1 week, then increase to 1.2mg  daily 09/20/16   Rhunette Croft, MD   lisinopril (PRINIVIL,ZESTRIL) 40 MG tablet Take 1 tablet (40 mg total) by mouth nightly. 07/01/16   Sindy Messing, NP       Review of Systems:      Review of Systems   Constitutional: Negative.    HENT: Negative.    Eyes: Negative.    Respiratory: Negative.    Cardiovascular: Negative.    Gastrointestinal: Negative.    Genitourinary: Negative.    Musculoskeletal: Negative.    Skin: Negative.    Neurological: Negative.    Endo/Heme/Allergies: Negative.    Psychiatric/Behavioral: Negative.        Physical Exam:     Vitals:    01/16/17 1551   BP: 130/84   Pulse: (!) 106   Temp: 97.7 F (36.5 C)   SpO2: 96%     Body mass index is 40.48 kg/m.    Physical Exam   Constitutional: He is oriented to person, place, and time. He appears well-developed and well-nourished.   HENT:   Head: Normocephalic and atraumatic.   Abscess above left canine on top jaw with large amount of purulent drainage expressed   Eyes: Pupils are equal, round, and reactive to light.  Conjunctivae are normal. No scleral icterus.   Fundoscopic exam normal   Neck: Neck supple. No JVD present. Carotid bruit is not present. No thyromegaly present.   No  thyroid nodules.  No lymphadenopathy.   Cardiovascular: Normal rate, regular rhythm and normal heart sounds.  Exam reveals no gallop and no friction rub.    No murmur heard.  Pulmonary/Chest: Effort normal and breath sounds normal. No respiratory distress. He has no wheezes. He has no rales.   Abdominal: Soft. Bowel sounds are normal. He exhibits no mass. There is no tenderness. There is no guarding.   No organomegaly.   Musculoskeletal: He exhibits no edema or deformity.   Neurological: He is alert and oriented to person, place, and time.   Skin: Skin is warm and dry. Capillary refill takes less than 2 seconds. No rash noted. He is not diaphoretic. No erythema.   Lesion over left temporal bone 3 mm sized seborrhea keratosis    Psychiatric: He has a normal mood and affect. His behavior is normal. Judgment and thought content normal.   Vitals reviewed.  Procedure note: L parietal SK was treated with 3 freeze-thaw cycles.  Patient tolerated well.       Assessment:     1. Type 2 diabetes mellitus with diabetic polyneuropathy, with long-term current use of insulin    2. Abscess    3. Seborrheic keratosis    4. Essential hypertension           Plan:     Orders Placed This Encounter   Procedures   . Ambulatory referral to Endocrinology     Referral Priority:   Routine     Referral Type:   Consultation     Referral Reason:   Specialty Services Required     Requested Specialty:   Endocrinology, Diabetes and Metabolism     Number of Visits Requested:   1        Requested Prescriptions     Signed Prescriptions Disp Refills   . clindamycin (CLEOCIN) 150 MG capsule 21 capsule 0     Sig: Take 1 capsule (150 mg total) by mouth 3 (three) times daily.       Patient Instructions   Gum infection-  Start clindamycin 150 mg po TID x7 days  Rinse mouth with salt water (0.5  tsp of salt and 1 cup warm water)  Start probiotics    Diabetes-  Increase lantus to 45 units at night  Referral to endocrinologist    Follow up in 3 months     Patient was counseled on possible medicine side effects which may include rash, swelling and/or stomach upset.  Patient was instructed to notify me or the ER if they experience problems.    Discussed with patient all diagnostics and consults ordered and addressed patient concerns prior to the completion of the visit.  Patient will be notified of results when they become available.  Encouraged to call for results if no contact within 14 days.     The patient's electronic medical record was reviewed, any changes in the past medical history, past surgical history, medications, diagnostic tests were noted, and the record was updated accordingly.    Discussed option available for my chart which provides electronic access to diagnostic results.      Scribed for Dr. Verne Spurr by Lucillie Garfinkel, Medical Scribe.  01/16/2017 4:23 PM    I, Dr. Verne Spurr, personally performed the services described in this documentation, as scribed by, Fara Olden Nochimson, in my presence. I agree with this documentation and it is both accurate and complete.

## 2017-01-16 NOTE — Patient Instructions (Addendum)
Gum infection-  Start clindamycin 150 mg po TID x7 days  Rinse mouth with salt water (0.5 tsp of salt and 1 cup warm water)  Start probiotics    Diabetes-  Increase lantus to 45 units at night  Referral to endocrinologist    Follow up in 3 months

## 2017-02-08 ENCOUNTER — Encounter (RURAL_HEALTH_CENTER): Payer: Self-pay | Admitting: Physician Assistant

## 2017-02-08 ENCOUNTER — Ambulatory Visit: Payer: BC Managed Care – PPO | Attending: Physician Assistant | Admitting: Physician Assistant

## 2017-02-08 VITALS — BP 140/80 | HR 97 | Temp 97.0°F | Wt 252.0 lb

## 2017-02-08 DIAGNOSIS — K047 Periapical abscess without sinus: Secondary | ICD-10-CM

## 2017-02-08 MED ORDER — AMOXICILLIN-POT CLAVULANATE 875-125 MG PO TABS
1.0000 | ORAL_TABLET | Freq: Two times a day (BID) | ORAL | 0 refills | Status: AC
Start: 2017-02-08 — End: 2017-02-18

## 2017-02-08 NOTE — Progress Notes (Signed)
Subjective:    Patient ID: [name] is a 51 y.o. male.    Dental pain. Recurrent. This time started a few days ago. abx help but then it comes back. Today there was a pus pocket in the area. No trouble with opening and closing jaw. Has not seen dentist. He has DMII.       The following portions of the patient's history were reviewed and updated as appropriate: allergies, current medications, past medical history, past social history and problem list.    Review of Systems   Constitutional: Negative for fatigue and fever.   HENT: Positive for dental problem.            Objective:    Physical Exam   Constitutional: He appears well-developed and well-nourished. No distress.   HENT:   Head: Normocephalic and atraumatic.   Mouth/Throat: No oral lesions. No trismus in the jaw. Dental abscesses present. No uvula swelling.       Eyes: Conjunctivae are normal.   Cardiovascular: Normal rate.    Pulmonary/Chest: Effort normal. No respiratory distress.   Skin: He is not diaphoretic.     Visit Vitals  BP 140/80 (BP Site: Left arm, Patient Position: Sitting, Cuff Size: Large)   Pulse 97   Temp 97 F (36.1 C)   Wt 114.3 kg (252 lb)   SpO2 100%   BMI 40.67 kg/m           Assessment:       1. Dental abscess          Plan:       1. Dental abscess  - amoxicillin-clavulanate (AUGMENTIN) 875-125 MG per tablet; Take 1 tablet by mouth 2 (two) times daily.for 10 days  Dispense: 20 tablet; Refill: 0    Advised seeing a dentist.  Understands with trismus or jaw pain to get here immediately.   - Discussed over-the-counter options as well as educated on therapies advised.  - discussed signs and symptoms that warrant immediate medical attention in the ER, patient clearly understands.  - Follow up with primary care doctor within the next week or sooner if symptoms are not improving.    Signed: Windell Moulding PA-C, 02/08/2017, 5:49 PM

## 2017-02-09 ENCOUNTER — Telehealth (RURAL_HEALTH_CENTER): Payer: Self-pay

## 2017-02-09 NOTE — Telephone Encounter (Signed)
Patient is finished with his antibiotic for his tooth infection but believes that he needs another round.

## 2017-02-09 NOTE — Telephone Encounter (Signed)
Was seen at after hours.

## 2017-02-16 ENCOUNTER — Other Ambulatory Visit (RURAL_HEALTH_CENTER): Payer: Self-pay | Admitting: Family Medicine

## 2017-02-16 DIAGNOSIS — Z794 Long term (current) use of insulin: Secondary | ICD-10-CM

## 2017-02-16 DIAGNOSIS — E1142 Type 2 diabetes mellitus with diabetic polyneuropathy: Secondary | ICD-10-CM

## 2017-02-20 ENCOUNTER — Other Ambulatory Visit (RURAL_HEALTH_CENTER): Payer: Self-pay

## 2017-02-20 DIAGNOSIS — E1142 Type 2 diabetes mellitus with diabetic polyneuropathy: Secondary | ICD-10-CM

## 2017-02-20 DIAGNOSIS — Z794 Long term (current) use of insulin: Secondary | ICD-10-CM

## 2017-02-24 MED ORDER — INSULIN ASPART 100 UNIT/ML SC SOPN
PEN_INJECTOR | SUBCUTANEOUS | 7 refills | Status: DC
Start: 2017-02-24 — End: 2017-05-11

## 2017-03-23 ENCOUNTER — Ambulatory Visit: Payer: BC Managed Care – PPO | Attending: Family | Admitting: Family

## 2017-03-23 ENCOUNTER — Encounter (RURAL_HEALTH_CENTER): Payer: Self-pay

## 2017-03-23 DIAGNOSIS — M25512 Pain in left shoulder: Secondary | ICD-10-CM

## 2017-03-23 DIAGNOSIS — G8929 Other chronic pain: Secondary | ICD-10-CM | POA: Insufficient documentation

## 2017-03-23 NOTE — Patient Instructions (Signed)
Ice alternating with heat to left side of neck and shoulder.  Rest left arm/shoulder.  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  You may return to work tomorrow.  Okay to take Ibuprofen or Tylenol for pain.  Follow up with Dr. Candis Schatz in 1 week.

## 2017-03-23 NOTE — Progress Notes (Signed)
EXTENDED HOURS CLINIC PROGRESS NOTE    Date Time: 03/23/2017 5:30 PM  Patient Name: [name]  Primary Care Physician: Rhunette Croft, MD      History of Presenting Illness:   [name] is a 52 y.o. male who presents to the office with   Chief Complaint   Patient presents with   . Shoulder Pain   HPI:  The patient presents with left shoulder pain. 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 After several 4 months, he states his left shoulder was so painful and stiff that he was moved to another position. 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 He normally sees Dr. 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 He plans to follow up with Dr. Candis Schatz for further evaluation.    Past Medical History:     Past Medical History:   Diagnosis Date   . Complication of anesthesia     last time he was kicking his legs had to wake him up   . Diabetes mellitus    . Diabetic ulcer of left foot 12/26/14   . Disc    . Gastroesophageal reflux disease    . Hyperlipidemia    . Hypertension    . Seasonal allergic rhinitis    . Sleep apnea     not tested   . Type 2 diabetes mellitus, controlled        Past Surgical History:     Past Surgical History:   Procedure Laterality Date   . ARTHRODESIS, TOE Left 05/04/2015    Procedure: ARTHRODESIS, TOE;  Surgeon: Janora Norlander, DPM;  Location: [facility];  Service: Podiatry;  Laterality: Left;  1st MPJ FUSION   . DEBRIDEMENT & IRRIGATION, LOWER EXTREMITY Left 03/12/2015    Procedure: [place], LOWER EXTREMITY;  Surgeon: Janora Norlander, DPM;  [facility];  Service: Podiatry;  Laterality: Left;  I&D  LEFT FOOT FOR BONE INFECTION   . DEBRIDEMENT & IRRIGATION, LOWER EXTREMITY Left 03/07/2016    Procedure: [place], LEFT FOOT 2ND TOE, EXCISION OF 2ND METATARSEL;  Surgeon: Janora Norlander, DPM;  Location: [facility];  Service: Podiatry;  Laterality: Left;  I&D Left Foot   . HAND SURGERY         Family History:     Family History   Problem Relation Age of Onset   . Diabetes Mother    . Depression Sister    . COPD Sister    . Hypertension Brother        Social History:     History   Smoking Status   . Former Smoker   . Packs/day: 0.25   . Types: Cigarettes   Smokeless Tobacco   . Never Used     History   Alcohol Use No     History   Drug Use No       Allergies:     Allergies   Allergen Reactions   . Flu Virus Vaccine Anaphylaxis       Medications:     Prior to Admission medications  Medication Sig Start Date End Date Taking? Authorizing Provider   aspirin EC 81 MG EC tablet Take 1 tablet (81 mg total) by mouth every evening. 07/01/16  Yes Sharp, Skyler T, NP   atorvastatin (LIPITOR) 40 MG tablet Take 1 tablet (40 mg total) by mouth daily. 09/20/16  Yes Rhunette Croft, MD   gabapentin (NEURONTIN) 300 MG capsule Take 1 capsule (300 mg total) by mouth 3 (three) times daily. 03/15/16  Yes Rhunette Croft, MD   hydroCHLOROthiazide (HYDRODIURIL) 12.5 MG tablet TAKE 1 TABLET BY MOUTH ONCE DAILY 11/04/16  Yes Rhunette Croft, MD   insulin aspart (NOVOLOG FLEXPEN) 100 UNIT/ML injection pen INJECT PER SLIDING SCALE MAX 30 UNITS PER DAY 02/24/17  Yes Rhunette Croft, MD   insulin glargine (LANTUS SOLOSTAR) 100 UNIT/ML injection pen INJECT 20 UNITS IN THE MORNING AND 40 UNITS AT BEDTIME 12/23/16  Yes Sharp, Skyler T, NP   liraglutide (VICTOZA) 18 MG/3ML injection 0.6mg  daily for 1 week, then increase to 1.2mg  daily 09/20/16  Yes Rhunette Croft, MD   lisinopril (PRINIVIL,ZESTRIL) 40 MG tablet Take 1 tablet (40 mg total) by mouth nightly. 07/01/16  Yes Janeece Riggers T, NP        Review of Systems:      Constitutional: Negative for fever, fatigue   Respiratory: Negative for cough and shortness of breath.    Cardiovascular: Negative for chest pain, palpitations and leg swelling.   Gastrointestinal: Negative for nausea, vomiting, changes in bowel habits.  Genitourinary: Negative for dysuria, urgency and difficulty urinating.   Musculoskeletal: left shoulder pain.  Neurological: Negative for dizziness, weakness and headaches.   Skin: Negative for rashes    Physical Exam:     Vitals:    03/23/17 1715   BP: 129/70   Pulse: 93   Resp: 16   Temp: 98.4 F (36.9 C)   SpO2: 99%     There is no height or weight on file to calculate BMI.    General:  no acute distress. Pleasant and well groomed.  HEENT: eomi, sclera anicteric, oropharynx clear without lesions, mucous membranes moist  Neck: supple, no lymphadenopathy  Cardiovascular: regular rate and rhythm, no murmurs  Lungs: clear to auscultation bilaterally, without wheezing, rhonchi, or rales  Abdomen: soft, non-tender, non-distended  Musculoskeletal: unable to abduct left arm due to pain and stiffness.  Neuro:  alert and oriented.   Skin: no rashes    Assessment:     1. Chronic left shoulder pain        Plan:     Patient Instructions   Ice alternating with heat to left side of neck and shoulder.  Rest left arm/shoulder.  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  You may return to work tomorrow.  Okay to take Ibuprofen or Tylenol for pain.  Follow up with Dr. Candis Schatz in 1 week.       Follow up if not improving.  Requested Prescriptions      No prescriptions requested or ordered in this encounter        Patient was counseled on possible medicine side effects which may include rash, swelling and/or stomach upset.  Patient was instructed to notify me or the ER if they experience problems.    No orders of the defined types were placed in this encounter.       Before leaving the office, the patient was  informed of all ordered diagnostic tests and consults.  If you have not heard back from [place] about test results in 14 days, please call the office at [phone].    Signed by: Frederick Peers, NP    Supervising Doctor: Terisa Starr, MD

## 2017-03-24 ENCOUNTER — Telehealth (RURAL_HEALTH_CENTER): Payer: Self-pay

## 2017-03-24 ENCOUNTER — Encounter (RURAL_HEALTH_CENTER): Payer: Self-pay | Admitting: Family

## 2017-03-24 NOTE — Telephone Encounter (Signed)
Call from [place] at the medical dept at [facility]. She notes they received work note done last evening, but they need it to specify why patient is unable to hang chickens and what condition is causing this. Also they need the sentence about cleaning injections needles removed as they state that will be the only job he'd be allowed to do and if they don't need that done everyday he will get sent home without any work. Please advise, thanks.  [place] 03/24/2017 11:06 AM

## 2017-03-24 NOTE — Telephone Encounter (Signed)
Spoke with [name] at [place] to inform as noted. [name] 03/24/2017 12:43 PM

## 2017-03-24 NOTE — Telephone Encounter (Signed)
He is supposed to follow up with Dr. Candis Schatz in 1 week. She can decide if his work restrictions will need to be changed. For this week, [name]'s needs to abide by the stated restrictions due to left shoulder pain. He will need to schedule his appointment as discussed.  Thanks,  Stanton Kidney

## 2017-03-30 ENCOUNTER — Ambulatory Visit: Payer: BC Managed Care – PPO | Attending: Physician Assistant | Admitting: Family

## 2017-03-30 ENCOUNTER — Encounter (RURAL_HEALTH_CENTER): Payer: Self-pay | Admitting: Family

## 2017-03-30 VITALS — BP 129/74 | HR 106 | Temp 98.3°F | Resp 16

## 2017-03-30 DIAGNOSIS — R6884 Jaw pain: Secondary | ICD-10-CM

## 2017-03-30 DIAGNOSIS — M25512 Pain in left shoulder: Secondary | ICD-10-CM

## 2017-03-30 DIAGNOSIS — G8929 Other chronic pain: Secondary | ICD-10-CM

## 2017-03-30 DIAGNOSIS — K047 Periapical abscess without sinus: Secondary | ICD-10-CM

## 2017-03-30 MED ORDER — AMOXICILLIN 500 MG PO CAPS
500.0000 mg | ORAL_CAPSULE | Freq: Three times a day (TID) | ORAL | 0 refills | Status: AC
Start: 2017-03-30 — End: 2017-04-09

## 2017-03-30 NOTE — Progress Notes (Signed)
EXTENDED HOURS CLINIC PROGRESS NOTE    Date Time: 03/30/2017 6:32 PM  Patient Name: [name], [name]  Primary Care Physician: Rhunette Croft, MD      History of Presenting Illness:   [name] is a 52 y.o. male who presents to the office with   Chief Complaint   Patient presents with   . Otalgia     right ear   . shoulder, left   HPI:  The patient presents with two complaints. His first is left shoulder pain. 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 He states he had the pain before when he worked this position so they moved him. Now the pain has returned. He is requesting a note to say that he cannot be pulled to this position due to his chronic left shoulder pain caused from repetitive motion.    His second complaint is lower jaw pain on the right side of his face. He states that this pain started 1-2 days ago and actually radiates up into his cheek and right ear. He admits he has multiple teeth that have broken off but does not have a dentist at this time. He is considering getting all of his teeth pulled but knows this will be expensive.    Past Medical History:     Past Medical History:   Diagnosis Date   . Complication of anesthesia     last time he was kicking his legs had to wake him up   . Diabetes mellitus    . Diabetic ulcer of left foot 12/26/14   . Disc    . Gastroesophageal reflux disease    . Hyperlipidemia    . Hypertension    . Seasonal allergic rhinitis    . Sleep apnea     not tested   . Type 2 diabetes mellitus, controlled        Past Surgical History:     Past Surgical History:   Procedure Laterality Date   . ARTHRODESIS, TOE Left 05/04/2015    Procedure: ARTHRODESIS, TOE;  Surgeon: Janora Norlander, DPM;  Location: [facility];  Service: Podiatry;  Laterality: Left;  1st MPJ FUSION   . DEBRIDEMENT & IRRIGATION, LOWER EXTREMITY Left 03/12/2015    Procedure: [place], LOWER  EXTREMITY;  Surgeon: Janora Norlander, DPM;  [facility];  Service: Podiatry;  Laterality: Left;  I&D LEFT FOOT FOR BONE INFECTION   . DEBRIDEMENT & IRRIGATION, LOWER EXTREMITY Left 03/07/2016    Procedure: [place], LEFT FOOT 2ND TOE, EXCISION OF 2ND METATARSEL;  Surgeon: Janora Norlander, DPM;  [facility];  Service: Podiatry;  Laterality: Left;  I&D Left Foot   . HAND SURGERY         Family History:     Family History   Problem Relation Age of Onset   . Diabetes Mother    . Depression Sister    . COPD Sister    . Hypertension Brother        Social History:     History   Smoking Status   . Former Smoker   . Packs/day: 0.25   . Types: Cigarettes   Smokeless Tobacco   . Never Used     History   Alcohol Use No     History   Drug Use No       Allergies:  Allergies   Allergen Reactions   . Flu Virus Vaccine Anaphylaxis       Medications:     Prior to Admission medications    Medication Sig Start Date End Date Taking? Authorizing Provider   aspirin EC 81 MG EC tablet Take 1 tablet (81 mg total) by mouth every evening. 07/01/16  Yes Sharp, Skyler T, NP   atorvastatin (LIPITOR) 40 MG tablet Take 1 tablet (40 mg total) by mouth daily. 09/20/16  Yes Rhunette Croft, MD   gabapentin (NEURONTIN) 300 MG capsule Take 1 capsule (300 mg total) by mouth 3 (three) times daily. 03/15/16  Yes Rhunette Croft, MD   hydroCHLOROthiazide (HYDRODIURIL) 12.5 MG tablet TAKE 1 TABLET BY MOUTH ONCE DAILY 11/04/16  Yes Rhunette Croft, MD   insulin aspart (NOVOLOG FLEXPEN) 100 UNIT/ML injection pen INJECT PER SLIDING SCALE MAX 30 UNITS PER DAY 02/24/17  Yes Rhunette Croft, MD   insulin glargine (LANTUS SOLOSTAR) 100 UNIT/ML injection pen INJECT 20 UNITS IN THE MORNING AND 40 UNITS AT BEDTIME 12/23/16  Yes Sharp, Skyler T, NP   liraglutide (VICTOZA) 18 MG/3ML injection 0.6mg  daily for 1 week, then increase to 1.2mg  daily 09/20/16  Yes Rhunette Croft, MD    lisinopril (PRINIVIL,ZESTRIL) 40 MG tablet Take 1 tablet (40 mg total) by mouth nightly. 07/01/16  Yes Sharp, Skyler T, NP   amoxicillin (AMOXIL) 500 MG capsule Take 1 capsule (500 mg total) by mouth 3 (three) times daily.for 10 days 03/30/17 04/09/17  Berkley Wrightsman, Cheryll Dessert, NP       Review of Systems:      Constitutional: Negative for fever, fatigue   HENT: positive for right ear, cheek and lower jaw pain.  Eyes: Negative for discharge, redness and itching.   Respiratory: Negative for cough and shortness of breath.    Cardiovascular: Negative for chest pain, palpitations and leg swelling.   Gastrointestinal: Negative for nausea, vomiting, changes in bowel habits.  Genitourinary: Negative for dysuria, urgency and difficulty urinating.   Musculoskeletal: left shoulder pain has improved with rest x 1 week.  Neurological: Negative for dizziness, weakness and headaches.   Skin: Negative for rashes    Physical Exam:     Vitals:    03/30/17 1742   BP: 129/74   Pulse: (!) 106   Resp: 16   Temp: 98.3 F (36.8 C)   SpO2: 98%     There is no height or weight on file to calculate BMI.    General:  no acute distress. Pleasant and well groomed.  HEENT: eomi, sclera anicteric, oropharynx clear without lesions, mucous membranes moist. No jaw click noted, Right TM is WNL. Multiple broken teeth at gumline lower right side. Gums are swollen.  Neck: supple, no lymphadenopathy  Cardiovascular: regular rate and rhythm, no murmurs  Lungs: clear to auscultation bilaterally, without wheezing, rhonchi, or rales  Abdomen: soft, non-tender, non-distended  Musculoskeletal: able to abduct to shoulder height with minimal pain. No longer guarding left shoulder/arm today.  Neuro:  alert and oriented.   Skin: no rashes    Assessment:     1. Dental abscess    2. Chronic left shoulder pain    3. Pain in lower jaw        Plan:     Patient Instructions   For dental pain:  Take Amoxicillin 500 mg, 1 tablet three times/day for 10 days.  Find a dentist in  the area.     For Shoulder pain:  Note provided for permanent work restrictions.  May take Tylenol as directed for dental pain and shoulder pain.     Follow up if not improving.  Requested Prescriptions     Signed Prescriptions Disp Refills   . amoxicillin (AMOXIL) 500 MG capsule 30 capsule 0     Sig: Take 1 capsule (500 mg total) by mouth 3 (three) times daily.for 10 days        Patient was counseled on possible medicine side effects which may include rash, swelling and/or stomach upset.  Patient was instructed to notify me or the ER if they experience problems.    No orders of the defined types were placed in this encounter.       Before leaving the office, the patient was informed of all ordered diagnostic tests and consults.     If you have not heard back from [place] about test results in 14 days, please call the office at [phone].    Signed by: Frederick Peers, NP    Supervising Doctor: Terisa Starr, MD

## 2017-03-30 NOTE — Patient Instructions (Addendum)
For dental pain:  Take Amoxicillin 500 mg, 1 tablet three times/day for 10 days.  Find a dentist in the area.     For Shoulder pain:  Note provided for permanent work restrictions.  May take Tylenol as directed for dental pain and shoulder pain.

## 2017-03-31 ENCOUNTER — Ambulatory Visit (RURAL_HEALTH_CENTER): Payer: BC Managed Care – PPO | Admitting: Family Medicine

## 2017-04-03 ENCOUNTER — Ambulatory Visit: Payer: BC Managed Care – PPO | Attending: Family | Admitting: Family

## 2017-04-03 ENCOUNTER — Encounter (RURAL_HEALTH_CENTER): Payer: Self-pay | Admitting: Family

## 2017-04-03 ENCOUNTER — Ambulatory Visit
Admission: RE | Admit: 2017-04-03 | Discharge: 2017-04-03 | Disposition: A | Discharge status: Home / Self Care | Payer: BC Managed Care – PPO | Source: Ambulatory Visit | Attending: Family | Admitting: Family

## 2017-04-03 VITALS — BP 120/66 | HR 108 | Temp 98.4°F | Wt 251.0 lb

## 2017-04-03 DIAGNOSIS — M25512 Pain in left shoulder: Secondary | ICD-10-CM | POA: Insufficient documentation

## 2017-04-03 DIAGNOSIS — G8929 Other chronic pain: Secondary | ICD-10-CM | POA: Insufficient documentation

## 2017-04-04 ENCOUNTER — Encounter (RURAL_HEALTH_CENTER): Payer: Self-pay | Admitting: Family

## 2017-04-04 NOTE — Progress Notes (Signed)
EXTENDED HOURS CLINIC PROGRESS NOTE    Date Time: 04/04/2017 3:04 PM  Patient Name: [name], [name]  Primary Care Physician: Rhunette Croft, MD      History of Presenting Illness:   OSAZE [name] is a 52 y.o. male who presents to the office with   Chief Complaint   Patient presents with   . Shoulder Pain     HPI:  The patient presents again for a follow up of left shoulder pain. He has been unable to see his PCP due to two cancellations of appointments. 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 He states he needs a more specific note to say what he can do and requests I talk with HR regarding his work restrictions as they now have been sending him home.     Past Medical History:     Past Medical History:   Diagnosis Date   . Complication of anesthesia     last time he was kicking his legs had to wake him up   . Diabetes mellitus    . Diabetic ulcer of left foot 12/26/14   . Disc    . Gastroesophageal reflux disease    . Hyperlipidemia    . Hypertension    . Seasonal allergic rhinitis    . Sleep apnea     not tested   . Type 2 diabetes mellitus, controlled        Past Surgical History:     Past Surgical History:   Procedure Laterality Date   . ARTHRODESIS, TOE Left 05/04/2015    Procedure: ARTHRODESIS, TOE;  Surgeon: Janora Norlander, DPM;  Location: [facility];  Service: Podiatry;  Laterality: Left;  1st MPJ FUSION   . DEBRIDEMENT & IRRIGATION, LOWER EXTREMITY Left 03/12/2015    Procedure: [place], LOWER EXTREMITY;  Surgeon: Janora Norlander, DPM;  [facility];  Service: Podiatry;  Laterality: Left;  I&D LEFT FOOT FOR BONE INFECTION   . DEBRIDEMENT & IRRIGATION, LOWER EXTREMITY Left 03/07/2016    Procedure: [place], LEFT FOOT 2ND TOE, EXCISION OF 2ND METATARSEL;  Surgeon: Janora Norlander, DPM;  [facility];  Service: Podiatry;   Laterality: Left;  I&D Left Foot   . HAND SURGERY         Family History:     Family History   Problem Relation Age of Onset   . Diabetes Mother    . Depression Sister    . COPD Sister    . Hypertension Brother        Social History:     History   Smoking Status   . Former Smoker   . Packs/day: 0.25   . Types: Cigarettes   Smokeless Tobacco   . Never Used     History   Alcohol Use No     History   Drug Use No       Allergies:     Allergies   Allergen Reactions   . Flu Virus Vaccine Anaphylaxis       Medications:     Prior to Admission medications    Medication Sig Start Date End Date Taking? Authorizing Provider   amoxicillin (AMOXIL) 500 MG capsule Take 1 capsule (500 mg total) by mouth 3 (three) times daily.for 10 days 03/30/17 04/09/17 Yes Aarilyn Dye, Cheryll Dessert, NP   aspirin EC 81 MG EC tablet Take  1 tablet (81 mg total) by mouth every evening. 07/01/16  Yes Sharp, Skyler T, NP   atorvastatin (LIPITOR) 40 MG tablet Take 1 tablet (40 mg total) by mouth daily. 09/20/16  Yes Rhunette Croft, MD   gabapentin (NEURONTIN) 300 MG capsule Take 1 capsule (300 mg total) by mouth 3 (three) times daily. 03/15/16  Yes Rhunette Croft, MD   hydroCHLOROthiazide (HYDRODIURIL) 12.5 MG tablet TAKE 1 TABLET BY MOUTH ONCE DAILY 11/04/16  Yes Rhunette Croft, MD   insulin aspart (NOVOLOG FLEXPEN) 100 UNIT/ML injection pen INJECT PER SLIDING SCALE MAX 30 UNITS PER DAY 02/24/17  Yes Rhunette Croft, MD   insulin glargine (LANTUS SOLOSTAR) 100 UNIT/ML injection pen INJECT 20 UNITS IN THE MORNING AND 40 UNITS AT BEDTIME 12/23/16  Yes Sharp, Skyler T, NP   liraglutide (VICTOZA) 18 MG/3ML injection 0.6mg  daily for 1 week, then increase to 1.2mg  daily 09/20/16  Yes Rhunette Croft, MD   lisinopril (PRINIVIL,ZESTRIL) 40 MG tablet Take 1 tablet (40 mg total) by mouth nightly. 07/01/16  Yes Janeece Riggers T, NP       Review of Systems:      Constitutional: Negative for fever, fatigue   Respiratory: Negative for cough and  shortness of breath.    Cardiovascular: Negative for chest pain, palpitations and leg swelling.   Gastrointestinal: Negative for nausea, vomiting, changes in bowel habits.  Genitourinary: Negative for dysuria, urgency and difficulty urinating.   Musculoskeletal: Positive for left shoulder pain.  Neurological: Negative for dizziness, weakness and headaches.   Skin: Negative for rashes    Physical Exam:     Vitals:    04/03/17 1724   BP: 120/66   Pulse: (!) 108   Temp: 98.4 F (36.9 C)   SpO2: 96%     Body mass index is 40.51 kg/m.    General:  no acute distress. Pleasant and well groomed.  HEENT: eomi, sclera anicteric, oropharynx clear without lesions, mucous membranes moist  Neck: supple, no lymphadenopathy  Cardiovascular: regular rate and rhythm, no murmurs  Lungs: clear to auscultation bilaterally, without wheezing, rhonchi, or rales  Abdomen: soft, non-tender, non-distended  Musculoskeletal: Patient is unable to lift left hand above the shoulder level due to pain.   Neuro:  alert and oriented.   Skin: no rashes    No results found for this or any previous visit (from the past 336 hour(s)).       Assessment:     1. Chronic left shoulder pain        Plan:   Patient Instructions   I will fax a letter to HR and also call them.  Let me know if you need anything further.  Please get x-ray of your shoulder.     Follow up if not improving.  Requested Prescriptions      No prescriptions requested or ordered in this encounter        Patient was counseled on possible medicine side effects which may include rash, swelling and/or stomach upset.  Patient was instructed to notify me or the ER if they experience problems.    Orders Placed This Encounter   Procedures   . XR Shoulder left 2+ views     Standing Status:   Future     Number of Occurrences:   1     Standing Expiration Date:   04/03/2018     Order Specific Question:   Reason for Exam:     Answer:   pain  left shoulder, repetitive motion at work makes it worse.         Before leaving the office, the patient was informed of all ordered diagnostic tests and consults.     If you have not heard back from [place] about test results in 14 days, please call the office at [phone].    Signed by: Frederick Peers, NP    Supervising Doctor: Terisa Starr, MD

## 2017-04-04 NOTE — Patient Instructions (Signed)
I will fax a letter to HR and also call them.  Let me know if you need anything further.  Please get x-ray of your shoulder.

## 2017-04-05 ENCOUNTER — Ambulatory Visit (RURAL_HEALTH_CENTER): Payer: BC Managed Care – PPO | Admitting: Family Medicine

## 2017-04-17 ENCOUNTER — Ambulatory Visit (RURAL_HEALTH_CENTER): Payer: BC Managed Care – HMO | Admitting: Family Medicine

## 2017-05-02 DIAGNOSIS — G629 Polyneuropathy, unspecified: Secondary | ICD-10-CM | POA: Insufficient documentation

## 2017-05-11 ENCOUNTER — Other Ambulatory Visit (RURAL_HEALTH_CENTER): Payer: Self-pay

## 2017-05-11 ENCOUNTER — Ambulatory Visit
Admission: RE | Admit: 2017-05-11 | Discharge: 2017-05-11 | Disposition: A | Discharge status: Home / Self Care | Payer: BC Managed Care – PPO | Source: Ambulatory Visit | Attending: Family Medicine | Admitting: Family Medicine

## 2017-05-11 ENCOUNTER — Ambulatory Visit: Payer: BC Managed Care – PPO | Attending: Internal Medicine | Admitting: Internal Medicine

## 2017-05-11 ENCOUNTER — Encounter (RURAL_HEALTH_CENTER): Payer: Self-pay

## 2017-05-11 VITALS — BP 124/70 | HR 92 | Ht 68.63 in | Wt 253.5 lb

## 2017-05-11 DIAGNOSIS — F17210 Nicotine dependence, cigarettes, uncomplicated: Secondary | ICD-10-CM

## 2017-05-11 DIAGNOSIS — Z794 Long term (current) use of insulin: Secondary | ICD-10-CM

## 2017-05-11 DIAGNOSIS — E1142 Type 2 diabetes mellitus with diabetic polyneuropathy: Secondary | ICD-10-CM | POA: Insufficient documentation

## 2017-05-11 DIAGNOSIS — E782 Mixed hyperlipidemia: Secondary | ICD-10-CM

## 2017-05-11 DIAGNOSIS — R252 Cramp and spasm: Secondary | ICD-10-CM | POA: Insufficient documentation

## 2017-05-11 DIAGNOSIS — E1165 Type 2 diabetes mellitus with hyperglycemia: Secondary | ICD-10-CM

## 2017-05-11 DIAGNOSIS — E1121 Type 2 diabetes mellitus with diabetic nephropathy: Secondary | ICD-10-CM

## 2017-05-11 DIAGNOSIS — I10 Essential (primary) hypertension: Secondary | ICD-10-CM

## 2017-05-11 LAB — COMPREHENSIVE METABOLIC PANEL
ALT: 32 U/L (ref 0–55)
AST (SGOT): 16 U/L (ref 10–42)
Albumin/Globulin Ratio: 0.82 Ratio (ref 0.70–1.50)
Albumin: 2.8 gm/dL — ABNORMAL LOW (ref 3.5–5.0)
Alkaline Phosphatase: 101 U/L (ref 40–145)
Anion Gap: 13 mMol/L (ref 7.0–18.0)
BUN / Creatinine Ratio: 20.9 Ratio (ref 10.0–30.0)
BUN: 27 mg/dL — ABNORMAL HIGH (ref 7–22)
Bilirubin, Total: 0.3 mg/dL (ref 0.1–1.2)
CO2: 26.5 mMol/L (ref 20.0–30.0)
Calcium: 9.2 mg/dL (ref 8.5–10.5)
Chloride: 107 mMol/L (ref 98–110)
Creatinine: 1.29 mg/dL (ref 0.80–1.30)
EGFR: 62 mL/min/{1.73_m2} (ref 60–150)
Globulin: 3.4 gm/dL (ref 2.0–4.0)
Glucose: 248 mg/dL — ABNORMAL HIGH (ref 71–99)
Osmolality Calc: 297 mOsm/kg (ref 275–300)
Potassium: 4.5 mMol/L (ref 3.5–5.3)
Protein, Total: 6.2 gm/dL (ref 6.0–8.3)
Sodium: 142 mMol/L (ref 136–147)

## 2017-05-11 LAB — LIPID PANEL
Cholesterol: 179 mg/dL (ref 75–199)
Coronary Heart Disease Risk: 6.17
HDL: 29 mg/dL — ABNORMAL LOW (ref 40–55)
LDL Calculated: UNDETERMINED mg/dL
Triglycerides: 575 mg/dL — ABNORMAL HIGH (ref 10–150)
VLDL: UNDETERMINED (ref 0–40)

## 2017-05-11 LAB — MICROALBUMIN, RANDOM URINE: Microalbumin-Random, U: 1493.37 ug/mL — ABNORMAL HIGH (ref 0.00–20.00)

## 2017-05-11 LAB — HEMOGLOBIN A1C: Hgb A1C, %: 12.3 %

## 2017-05-11 MED ORDER — INSULIN ASPART 100 UNIT/ML SC SOPN
PEN_INJECTOR | SUBCUTANEOUS | 1 refills | Status: DC
Start: ? — End: 2017-05-11

## 2017-05-11 NOTE — Progress Notes (Signed)
New Patient Diabetes Consult      Patient Name:  [name] [[id number]] DOB: 08-Mar-1965  Date: 05/11/2017    Subjective:          [name] is a 52 y.o. male who is referred by Dr. Candis Schatz for evaluation of type 2 diabetes mellitus. Diabetes was diagnosed 18-19 years ago when the patient was seen by ED for what was thought to be a spider bite on his hand.  Blood sugar was over 500 at that time...   The patient was hospitalized and started on insulin immediately. He was discharged after 3-4 days (had surgery on hand) on metformin for 5 years.  Novolog was then added, with Lantus being added 3 years ago.  He did take [place] for a few months but was stopped when hospitalized due to kidney function. Victoza was added last year.  He was advised to get an insulin pump. He has had multiple surgeries for osteomyelitis on his left foot in 2017.     Monitoring, Compliance and Complications:  Diagnosis Date: ~2000-2001  Prior Diabetic Treatment: Wilder Glade (discontinued due to renal function)  Hgb A1c at time of referral: 11.1% (done 12/20/16). A1c drawn today prior to appointment  HGB A1C goal based on age and comorbidities is 7%.  The patient has the following diabetic complications:  retinopathy    Glucose Meter: Reli-On, not sure which model  Glucose Meter Validation Date: never  The patient did not bring the glucose meter or glucose readings today.  Compliance with blood glucose monitoring: fair   The patient is currently testing home blood glucose  3-4 times per day  Glucose readings are as follows:  none to review.  Blood sugars "are running in the 300's, mid-to-low 200's when home from work"    Injections:   Injections are given by patient.   Injections sites include: abdominal wall  Injection compliance: The patient never misses injections.  No problems noted with injection sites.     Diet, Exercise, and Weight:     The patient follows no particular diet.  Compliance with diet has been poor.   Exercise:  The patient is unable to exercise due to previous feet surgery.  Weight: The patient notes weight gain of 50 lb since past year.     Hypoglycemia:   There is a history of mild occasional hypoglycemia.  Symptoms of hypoglycemia include: confusion, hunger and jitteriness . The patient treats hypoglycemia with eating a snack or meal, or glucose tablets.  The patient does not have a Glucagon Emergency Kit.  The patient does not have a medical alert bracelet or necklace.    Diabetic Education: The patient has never received diabetes education.         Diabetic Complication Screening/Treatment  Eye exam: Dr. Andres Ege [facility]". Last visit was 2017.  No evidence of diabetic retinopathy on last eye exam.  Urine microalbumin/creatinine ratio: pending today  Foot Care: The patient reports continued problems with  callouses and sores, and ulcers The patient cuts toenails without difficulty.   Monofilament: 05/11/17, partially intact  Aspirin Therapy: The patient is currently taking aspirin at dose indicated in the medication list.   Cardiovascular risk factors: diabetes mellitus, dyslipidemia, hypertension, male gender, obesity (BMI >= 30 kg/m2), sedentary lifestyle and smoking/ tobacco exposure  Ace Inhibitor/ARB: The patient is taking ACE-I/ARB at dose indicated in medication list and is tolerating this well.  Dental care: Patient has not received dental care in past year due to "bad  experience as a child, have phobia"     Current Outpatient Prescriptions   Medication Sig Dispense Refill   . aspirin EC 81 MG EC tablet Take 1 tablet (81 mg total) by mouth every evening. 90 tablet 3   . atorvastatin (LIPITOR) 40 MG tablet Take 1 tablet (40 mg total) by mouth daily. 90 tablet 3   . gabapentin (NEURONTIN) 300 MG capsule Take 1 capsule (300 mg total) by mouth 3 (three) times daily. 270 capsule 3   . hydroCHLOROthiazide (HYDRODIURIL) 12.5 MG tablet TAKE 1 TABLET BY MOUTH ONCE DAILY 90 tablet 3   . insulin aspart (NOVOLOG FLEXPEN)  100 UNIT/ML injection pen INJECT PER SLIDING SCALE MAX 30 UNITS PER DAY 15 mL 7   . insulin glargine (LANTUS SOLOSTAR) 100 UNIT/ML injection pen INJECT 20 UNITS IN THE MORNING AND 40 UNITS AT BEDTIME (Patient taking differently: INJECT 30 UNITS IN THE MORNING AND 45 UNITS AT BEDTIME) 27 mL 3   . Insulin Pen Needle 31G X 6 MM Misc by Does not apply route.Use up to 7 times a day as directed     . liraglutide (VICTOZA) 18 MG/3ML injection 0.6mg  daily for 1 week, then increase to 1.2mg  daily (Patient taking differently: Inject 1.2 mg into the skin daily.    ) 15 mL 3   . lisinopril (PRINIVIL,ZESTRIL) 40 MG tablet Take 1 tablet (40 mg total) by mouth nightly.       No current facility-administered medications for this visit.       Medication review with [name] suggested compliance all of the time.       Immunization History   Administered Date(s) Administered   . Td 06/14/2009     The following portions of the patient's history were reviewed and updated as appropriate: allergies, current medications, past family history, past medical history, past social history, past surgical history and problem list.  The following information was also reviewed at today's visit: office notes from referring provider and lab data    Review of Systems  11 point review of systems based on handwritten patient  questionnaire  was reviewed with the patient. This has been scanned into the chart  Pertinent positives and negatives are outlined above.  Other positives include: Fatigue, temperature intolerance, dry skin, blurred vision, decreased hearing, seasonal allergies, wheezing at times, pedal edema when he is on his feet all day, occasional palpitations, upset stomach with eating certain foods, frequent heartburn, urinary frequency with nocturia 2-3, erectile dysfunction, back muscle and joint pains, occasional dizziness, memory disturbance, numbness in his feet and occasionally in his arms     Objective:      Vital Signs: BP  124/70 (BP Site: Left arm, Patient Position: Sitting, Cuff Size: Large)   Pulse 92   Ht 1.743 m (5' 8.63")   Wt 115 kg (253 lb 8 oz)   BMI 37.85 kg/m   PE  Well nourished, well appearing, in no acute distress.  HEENT:  There is no stare or lid lag. There is no periorbital edema. Extraoccular movements are intact. No conjunctival injection.  Mucous membranes are moist.  Poor dentition.  Neck is supple without adenopathy.  The thyroid is normal in size and consistency and no nodules are appreciated.  Lungs are clear to auscultation.  Cardiac exam reveals a regular rate and rhythm. No murmur or gallop is appreciated.   Exam of the extremities reveals trace pitting edema.   Posterior tibial and doralis pedis pulses are normal.  There is deformity of the (L) foot, including past surgery.  Healed surgery scars..  Callouses are noted on feet. Toenail hygeine is  poor Onychomycosis is noted in multiple nails on both feet. severe dry skin is noted on both feet.    On neurologic exam, the patient is alert and appropriate. Gait and speech are normal.There is no tremor of the outstretched hands. Reflexes are  2+ and symmetrical with  normal relaxation phases.   Skin is cool and dry.      Lab Review    Lab Results   Component Value Date    HGBA1CPERCNT 11.1 12/19/2016    HGBA1CPERCNT 9.2 09/15/2016    HGBA1CPERCNT 8.7 04/23/2016    CHOL 179 05/11/2017    HDL 29 (L) 05/11/2017    LDL  05/11/2017     Unable to calculate due to triglycerides >400 mg/dl    TRIG 575 (H) 05/11/2017    ALT 32 05/11/2017    GLU 248 (H) 05/11/2017    K 4.5 05/11/2017    CA 9.2 05/11/2017    CREAT 1.29 05/11/2017    CO2 26.5 05/11/2017    EGFR 62 05/11/2017    NA 142 05/11/2017    ALKPHOS 101 05/11/2017          Assessment:      1. Type 2 diabetes mellitus with hyperglycemia, with long-term current use of insulin     2. Mixed hyperlipidemia     3. Cigarette nicotine dependence without complication     4. Essential hypertension     5. Diabetic  nephropathy associated with type 2 diabetes mellitus       Diabetes: Diabetes is suboptimally controlled. Discussed specific areas of diabetes management as detailed under patient instructions below.      Lipids: We have reviewed that the triglycerides are about goal. We have discussed that they are likely to improve with decreased intake of simple sugars and improved diabetic control.    BP: Blood pressure is well controlled on the current regimen which is well tolerated and will be continued.    I have also encouraged the patient to stop using tobacco products. 3 minutes spent counseling the patient regarding smoking cessation.             Plan:   Below is a summary of information and instructions we reviewed at today's appointment. Please review this information carefully and call if you have any questions regarding it.     Diabetes information/instructions:   Labwork: You are due for lab work  on or after 08/11/17.   You should fast for 10 hours prior to having this labwork done.  Please take your meter to the lab and check your fingerstick blood sugar while there. Please call or send [place] a message when you get home with the reading you got so we can compare to what the lab got and verify your meter is in range. Do not ask lab staff to check your meter, they do not know how!   Self-monitoring of blood glucose: We recommend that you monitor your blood glucose before breakfast, lunch, dinner and bedtime, and as needed for signs and/or symptoms of hypoglycemia. Please fax, email, drop off or MyChartMessage your blood sugar readings for further adjustment of regimen  in one week.  Please bring meter with you to every visit.  Insulin:  Continue current insulin regimen for now, except follow new sliding scale for Novolog and stop your AM Lantus shot tomorrow morning and  increase your PM Lantus to 60 units daily.   Diabetic Medication: Continue your current diabetic medication regimen unchanged except increase Victoza to  1.8 mg daily..  Hypoglycemia prevention and treatment:  Please carry fast-acting carbohydrate source (preferably glucose tablets) with you at all times. Please treat low blood sugar with 15 grams carbohydrate (4 glucose tablets), wait 15 minutes, and recheck sugar. Re-treat if necessary.   Please notify office for any episode of severe hypoglycemia, such as those requiring assistance from someone else.   Please obtain medical identification indicating that you have diabetes, and wear this all of the time.   Diet: Please aim for a consistent carbohydrate intake at each meal. 60 grams (or 4 "choices") per meal.  Exercise: We have recommended that you increase your exercise as able.  Foot care:  Please examine feet daily and appropriately treat callouses or lesions. Please avoid being barefoot or wearing of ill fitting shoes. Please use lotion as needed to treat dry skin on feet.  Maintain follow-up with your podiatrist.  Diabetic Education:  I have recommended that you meet with the nurse educator and dietition to review  Comprehensive Diabetes Education Program  If you do not hear back from the Diabetes Management Program about scheduling your appointment within 1 week, please call their office directly. [facility] DMP: [phone]; [facility] DMP [phone]; [facility] DMP: [phone]; [place] DMP: [place] DMP: [phone]; [place] DMP: [phone]   Diabetic screening is up-to-date except for a dental exam. Please schedule an appointment with your dentist.  You may wish to consider sedation dentistry. One provider is Dr. Burnadette Peter at [facility], [phone].    The following additional instructions relate to health conditions other than your diabetes:   Cholesterol/lipids: Your cholesterol is in good range but your triclyerides are elevated. We have reviewed that improvement in diabetic control and decreased intake of "simple" sugars (including fruit, fruit juice, other sweet drinks and  sugary foods) can help to improve your triglycerides.    Blood Pressure: Your blood pressure is well controlled on the current medication regimen and we will continue this.   Tobacco use:  We have reviewed the health risks associated with tobacco today. Please stop using tobacco products!  The Freedom From Smoking program is FREE and is open to [facility] patients as well as community members. Course dates, times and locations can be found at [email or web address], and when you, your friend or family member is ready to quit, you can register for a class by calling [phone].      Return in about 6 weeks (around 06/22/2017).    Briscoe Burns, MD     Patient seen with Merryl Hacker, RN, CDE

## 2017-05-11 NOTE — Telephone Encounter (Signed)
Patient called the office and he was placed on correction scale today and his max dosing on a bad day would be 54 units  Pending new refill

## 2017-05-11 NOTE — Patient Instructions (Signed)
Below is a summary of information and instructions we reviewed at today's appointment. Please review this information carefully and call if you have any questions regarding it.     Diabetes information/instructions:   Labwork: You are due for lab work  on or after 08/11/17.   You should fast for 10 hours prior to having this labwork done.  Please take your meter to the lab and check your fingerstick blood sugar while there. Please call or send [place] a message when you get home with the reading you got so we can compare to what the lab got and verify your meter is in range. Do not ask lab staff to check your meter, they do not know how!   Self-monitoring of blood glucose: We recommend that you monitor your blood glucose before breakfast, lunch, dinner and bedtime, and as needed for signs and/or symptoms of hypoglycemia. Please fax, email, drop off or MyChartMessage your blood sugar readings for further adjustment of regimen  in one week.  Please bring meter with you to every visit.  Insulin:  Continue current insulin regimen for now, except follow new sliding scale for Novolog and stop your AM Lantus shot tomorrow morning and increase your PM Lantus to 60 units daily.   Diabetic Medication: Continue your current diabetic medication regimen unchanged except increase Victoza to 1.8 mg daily..  Hypoglycemia prevention and treatment:  Please carry fast-acting carbohydrate source (preferably glucose tablets) with you at all times. Please treat low blood sugar with 15 grams carbohydrate (4 glucose tablets), wait 15 minutes, and recheck sugar. Re-treat if necessary.   Please notify office for any episode of severe hypoglycemia, such as those requiring assistance from someone else.   Please obtain medical identification indicating that you have diabetes, and wear this all of the time.   Diet: Please aim for a consistent carbohydrate intake at each meal. 60 grams (or 4 "choices") per meal.  Exercise: We have recommended that you  increase your exercise as able.  Foot care:  Please examine feet daily and appropriately treat callouses or lesions. Please avoid being barefoot or wearing of ill fitting shoes. Please use lotion as needed to treat dry skin on feet.  Maintain follow-up with your podiatrist.  Diabetic Education:  I have recommended that you meet with the nurse educator and dietition to review  Comprehensive Diabetes Education Program  If you do not hear back from the Diabetes Management Program about scheduling your appointment within 1 week, please call their office directly. [facility] DMP: [phone]; [facility] Gash DMP [phone]; [facility] DMP: [phone]; [place] DMP: [place] DMP: [phone]; [place] DMP: [phone]   Diabetic screening is up-to-date except for a dental exam. Please schedule an appointment with your dentist.  You may wish to consider sedation dentistry. One provider is Dr. Burnadette Peter at [facility], [phone].    The following additional instructions relate to health conditions other than your diabetes:   Cholesterol/lipids: Your cholesterol is in good range but your triclyerides are elevated. We have reviewed that improvement in diabetic control and decreased intake of "simple" sugars (including fruit, fruit juice, other sweet drinks and sugary foods) can help to improve your triglycerides.    Blood Pressure: Your blood pressure is well controlled on the current medication regimen and we will continue this.   Tobacco use:  We have reviewed the health risks associated with tobacco today. Please stop using tobacco products!  The Freedom From Smoking program is FREE and is open to [facility] patients  as well as community members. Course dates, times and locations can be found at [email or web address], and when you, your friend or family member is ready to quit, you can register for a class by calling [phone].

## 2017-05-12 ENCOUNTER — Other Ambulatory Visit (RURAL_HEALTH_CENTER): Payer: Self-pay | Admitting: Family Medicine

## 2017-05-12 DIAGNOSIS — Z794 Long term (current) use of insulin: Secondary | ICD-10-CM

## 2017-05-12 LAB — VITAMIN D,25 OH,TOTAL: Vitamin D 25-Hydroxy: 14 ng/mL — ABNORMAL LOW (ref 30–80)

## 2017-05-26 ENCOUNTER — Other Ambulatory Visit (RURAL_HEALTH_CENTER): Payer: Self-pay | Admitting: Family Medicine

## 2017-05-26 DIAGNOSIS — I10 Essential (primary) hypertension: Secondary | ICD-10-CM

## 2017-05-30 ENCOUNTER — Telehealth (RURAL_HEALTH_CENTER): Payer: Self-pay

## 2017-05-30 NOTE — Telephone Encounter (Signed)
I MyChart'd the patient regarding blood sugar readings from 4/1 to 4/11 received and reviewed.

## 2017-06-01 ENCOUNTER — Telehealth (RURAL_HEALTH_CENTER): Payer: Self-pay

## 2017-06-01 NOTE — Telephone Encounter (Signed)
4/18 lmtcb or read the my chart message

## 2017-06-01 NOTE — Telephone Encounter (Signed)
Please contact patient as it appears he has an unread MyChart message from 05/30/17.  Thanks.

## 2017-06-02 NOTE — Telephone Encounter (Signed)
Left message for patient to return call.

## 2017-06-05 ENCOUNTER — Telehealth (RURAL_HEALTH_CENTER): Payer: Self-pay

## 2017-06-05 NOTE — Telephone Encounter (Signed)
Reviewed [facility] message with patient. He states he does not know how to use his MYCHART. Reviewed blood sugar note from [place]. Patient verbalized understanding and will report blood sugars as requested.    [email or web address]

## 2017-06-12 ENCOUNTER — Telehealth (RURAL_HEALTH_CENTER): Payer: Self-pay

## 2017-06-12 NOTE — Telephone Encounter (Signed)
LMTCB on Cell VM [phone]

## 2017-06-12 NOTE — Telephone Encounter (Signed)
Patient aware and understands

## 2017-06-12 NOTE — Telephone Encounter (Signed)
My chart message was reviewed.

## 2017-06-12 NOTE — Telephone Encounter (Signed)
Please call the patient regarding blood sugar readings received with the following message: Blood sugar readings from 4/12 to 4/22 received and reviewed.  Blood sugars above target fasting and at various times during the day.. Please increase Novolog scale insulin 2 units for blood sugar 201 or higher.  Please increase bedtime Lantus insulin 2 units to 62 units and  bring glucose readings to next visit on 06/26/17.

## 2017-06-22 ENCOUNTER — Telehealth (RURAL_HEALTH_CENTER): Payer: Self-pay

## 2017-06-22 NOTE — Telephone Encounter (Signed)
I called the patient regarding blood sugar readings from 06/06/17 to 06/17/17 received and reviewed, left voice message.  Blood sugars above target fasting most mornings. Please increase Lantus insulin to 64 units at night and  bring glucose readings to next visit with Almyra Free on 06/26/17.

## 2017-06-26 ENCOUNTER — Ambulatory Visit: Payer: BC Managed Care – PPO | Attending: Family | Admitting: Family

## 2017-06-26 ENCOUNTER — Other Ambulatory Visit
Admission: RE | Admit: 2017-06-26 | Discharge: 2017-06-26 | Disposition: A | Discharge status: Home / Self Care | Payer: BC Managed Care – PPO | Source: Ambulatory Visit | Attending: Internal Medicine | Admitting: Internal Medicine

## 2017-06-26 ENCOUNTER — Encounter (RURAL_HEALTH_CENTER): Payer: Self-pay | Admitting: Family

## 2017-06-26 ENCOUNTER — Other Ambulatory Visit (RURAL_HEALTH_CENTER): Payer: Self-pay | Admitting: Family

## 2017-06-26 VITALS — BP 132/76 | HR 88 | Resp 14 | Wt 251.3 lb

## 2017-06-26 DIAGNOSIS — E1165 Type 2 diabetes mellitus with hyperglycemia: Secondary | ICD-10-CM

## 2017-06-26 DIAGNOSIS — Z794 Long term (current) use of insulin: Secondary | ICD-10-CM

## 2017-06-26 DIAGNOSIS — E559 Vitamin D deficiency, unspecified: Secondary | ICD-10-CM

## 2017-06-26 DIAGNOSIS — F17210 Nicotine dependence, cigarettes, uncomplicated: Secondary | ICD-10-CM

## 2017-06-26 DIAGNOSIS — E1121 Type 2 diabetes mellitus with diabetic nephropathy: Secondary | ICD-10-CM

## 2017-06-26 DIAGNOSIS — E782 Mixed hyperlipidemia: Secondary | ICD-10-CM

## 2017-06-26 DIAGNOSIS — I10 Essential (primary) hypertension: Secondary | ICD-10-CM

## 2017-06-26 MED ORDER — VITAMIN D 50 MCG (2000 UT) PO TABS
2000.0000 [IU] | ORAL_TABLET | Freq: Every day | ORAL | Status: DC
Start: ? — End: 2017-06-26

## 2017-06-26 MED ORDER — LANTUS SOLOSTAR 100 UNIT/ML SC SOPN
67.00 [IU] | PEN_INJECTOR | Freq: Every evening | SUBCUTANEOUS | 1 refills | Status: DC
Start: ? — End: 2017-06-26

## 2017-06-26 MED ORDER — LIRAGLUTIDE 18 MG/3ML SC SOPN
1.80 mg | PEN_INJECTOR | Freq: Every day | SUBCUTANEOUS | 1 refills | Status: DC
Start: ? — End: 2017-06-26

## 2017-06-26 MED ORDER — FREESTYLE LIBRE 14 DAY READER DEVI
1.00 | Freq: Four times a day (QID) | 3 refills | Status: DC
Start: ? — End: 2017-06-26

## 2017-06-26 MED ORDER — FREESTYLE LIBRE 14 DAY SENSOR MISC
1.00 | 2 refills | Status: DC
Start: ? — End: 2017-06-26

## 2017-06-26 NOTE — Progress Notes (Signed)
Endocrine Provider Progress Note      Patient Name:  [name] [[id number]] DOB: 16-May-1965  Date: 06/26/2017    Subjective:          [name] is a 52 y.o. male who presents for follow up of type 2 diabetes mellitus.   Since the last visit,  the patient has been well with no interval health problems.  Patient notes a history of vitamin D deficiency but has not been taking vitamin D.     Monitoring, Compliance and Complications:  Diagnosis Date: ~2000-2001  Prior Diabetic Treatment: Wilder Glade (discontinued due to renal function)  Hgb A1c at time of referral: 11.1% (done 12/20/16). A1c drawn today prior to appointment  HGB A1C goal based on age and comorbidities is 7%.  The patient has the following diabetic complications:  retinopathy  He has had multiple surgeries for osteomyelitis on his left foot in 2017.     Glucose Meter: Reli-On, not sure which model  Glucose Meter Validation Date: never  The patient did not bring the glucose meter or glucose readings today.  Compliance with blood glucose monitoring: fair   The patient is currently testing home blood glucose  4 times per day and before breakfast, lunch and dinner and bedtime  Glucose readings are as follows:  AC breakfast: 129,220-301  AC lunch: 184-237  AC dinner: 92-237  Bedtime: 163-253    Injections:   Injections are given by patient.   Injections sites include: abdominal wall  Injection compliance: The patient never misses injections.  No problems noted with injection sites.     Diet, Exercise, and Weight:     The patient follows no particular diet.  Compliance with diet has been poor.   Exercise: The patient is unable to exercise due to previous feet surgery.  Weight: Weight has been stable since last visit.    Hypoglycemia:   The patient has experienced mild occasional hypoglycemia since the last visit. Symptoms of hypoglycemia include: shaking with glucose as low as 90s . The patient treats hypoglycemia with meal or glucose tablets  The  patient does not have a Glucagon Emergency Kit.  The patient does not have a medical alert bracelet or necklace.    Diabetic Education: The patient has never received diabetes education.  He has been referred and is waiting to hear back from insurance about coverage.  He is interested in Keyser device but has concerns about cost.        Diabetic Complication Screening/Treatment  Eye exam: Dr. Andres Ege [facility]". Last visit was sometime in 2018.  No evidence of diabetic retinopathy on last eye exam.  Urine microalbumin/creatinine ratio: M:C ratio 4745  Foot Care: The patient reports no new foot problems. The patient checks the feet regularly.  The patient cuts toenails without difficulty.   Monofilament: 05/11/17, partially intact Overdue for follow up with podiatrist  Aspirin Therapy: The patient is currently taking aspirin at dose indicated in the medication list.   Cardiovascular risk factors: diabetes mellitus, dyslipidemia, hypertension, male gender, obesity (BMI >= 30 kg/m2), sedentary lifestyle and smoking/ tobacco exposure  Ace Inhibitor/ARB: The patient is taking ACE-I/ARB at dose indicated in medication list and is tolerating this well.  Dental care: Patient has not received dental care in past year due to "bad experience as a child, have phobia"     Current Outpatient Prescriptions   Medication Sig Dispense Refill   . aspirin EC 81 MG EC tablet Take 1 tablet (81 mg  total) by mouth every evening. 90 tablet 3   . atorvastatin (LIPITOR) 40 MG tablet Take 1 tablet (40 mg total) by mouth daily. 90 tablet 3   . Blood Glucose Monitoring Suppl (RELION PRIME MONITOR) Device by Does not apply route USE TO TEST BLOOD GLUCOSE FOUR TIMES DAILY     . gabapentin (NEURONTIN) 300 MG capsule TAKE ONE CAPSULE BY MOUTH THREE TIMES DAILY 90 capsule 11   . glucose blood (RELION PRIME TEST) test strip 1 each by Other route 4 (four) times daily Use as instructed     . hydroCHLOROthiazide (HYDRODIURIL) 12.5 MG tablet  TAKE 1 TABLET BY MOUTH ONCE DAILY 90 tablet 3   . insulin aspart (NOVOLOG FLEXPEN) 100 UNIT/ML injection pen INJECT PER SLIDING SCALE MAX 54 UNITS PER DAY (Patient taking differently: INJECT PRIOR TO MEALS PER CORRECTION SCALE UP TO 54 UNITS DAILY   ) 60 mL 1   . insulin glargine (LANTUS SOLOSTAR) 100 UNIT/ML injection pen INJECT 20 UNITS IN THE MORNING AND 40 UNITS AT BEDTIME (Patient taking differently: 64 UNITS AT BEDTIME) 27 mL 3   . Insulin Pen Needle 31G X 6 MM Misc by Does not apply route.Use up to 7 times a day as directed     . liraglutide (VICTOZA) 18 MG/3ML injection 0.6mg  daily for 1 week, then increase to 1.2mg  daily (Patient taking differently: Inject 1.2 mg into the skin daily.    ) 15 mL 3   . lisinopril (PRINIVIL,ZESTRIL) 20 MG tablet TAKE 2 TABLETS BY MOUTH NIGHTLY 180 tablet 3     No current facility-administered medications for this visit.       Medication review with [name] suggested compliance all of the time.       Immunization History   Administered Date(s) Administered   . Td 06/14/2009     The following portions of the patient's history were reviewed and updated as appropriate: allergies, current medications, past family history, past medical history, past social history, past surgical history and problem list.  The following information was also reviewed at today's visit: office notes from referring provider and lab data    Review of Systems  Positive for swelling at ankles x few years has discussed with PCP, leg cramps all his life. Negative for CP, SOB, C/D.      Objective:      Vital Signs: BP 132/76 (BP Site: Right arm, Patient Position: Sitting, Cuff Size: Large) Comment: manual  Pulse 88 Comment: regular  Resp 14   Wt 114 kg (251 lb 4.8 oz)   BMI 37.52 kg/m   PE  Well nourished, well appearing, in no acute distress.  HEENT:  There is no stare or lid lag. There is no periorbital edema. Extraoccular movements are intact. No conjunctival injection.  Mucous membranes are  slightly dry.  Neck is supple without adenopathy.  The thyroid is normal in size and firm in consistency. No nodules are appreciated.   Lungs are clear to auscultation.  Cardiac exam reveals a regular rate and rhythm. No murmur or gallop is appreciated.   Exam of the extremities reveals 1+ pitting edema to lower shin right worse than left.     There is deformity of the (L) foot, including past surgery.  Healed surgery scars..  Callouses are noted on feet. Toenail hygeine is  fair  severe dry skin is noted on both feet.    On neurologic exam, the patient is alert and appropriate. Gait and speech are  normal.There is no tremor of the outstretched hands. Reflexes are  2+ and symmetrical with  normal relaxation phases.   Skin is cool and dry.      Lab Review    Lab Results   Component Value Date    HGBA1CPERCNT 12.3 05/11/2017    HGBA1CPERCNT 11.1 12/19/2016    HGBA1CPERCNT 9.2 09/15/2016    CHOL 179 05/11/2017    HDL 29 (L) 05/11/2017    LDL  05/11/2017     Unable to calculate due to triglycerides >400 mg/dl    TRIG 575 (H) 05/11/2017    ALT 32 05/11/2017    VITD 14 (L) 05/11/2017    GLU 248 (H) 05/11/2017    K 4.5 05/11/2017    CA 9.2 05/11/2017    CREAT 1.29 05/11/2017    CO2 26.5 05/11/2017    EGFR 62 05/11/2017    NA 142 05/11/2017    ALKPHOS 101 05/11/2017          Assessment:      1. Type 2 diabetes mellitus with hyperglycemia, with long-term current use of insulin  liraglutide (VICTOZA) 18 MG/3ML injection    insulin glargine (LANTUS SOLOSTAR) 100 UNIT/ML injection pen    Continuous Blood Gluc Receiver (FREESTYLE LIBRE 14 DAY READER) Device    Continuous Blood Gluc Sensor (FREESTYLE LIBRE 14 DAY SENSOR) Misc   2. Essential hypertension     3. Mixed hyperlipidemia     4. Vitamin D deficiency  Vitamin D,25 OH, Total   5. Cigarette nicotine dependence without complication       Diabetes: Diabetes is suboptimally controlled but appears to be improving based on most recent glucose readings. Discussed specific areas of  diabetes management as detailed under patient instructions below.  Will check protein:creatinine ratio now.  Lipids: We have reviewed that the triglycerides are about goal. We have discussed that they are likely to improve with decreased intake of simple sugars and improved diabetic control.    BP: Blood pressure is well controlled on the current regimen which is well tolerated and will be continued.    Vitamin D: Low. Will start vitamin D replacement and recheck vitamin D along with upcoming DM labs.  I have also encouraged the patient to stop using tobacco products. 3 minutes spent counseling the patient regarding smoking cessation.          Plan:   Below is a summary of information and instructions we reviewed at today's appointment. Please review this information carefully and call if you have any questions regarding it.     Diabetes information/instructions:   Labwork: You are due for lab work  on or after 08/11/17.   You should fast for 10 hours prior to having this labwork done.  Please take your meter to the lab and check your fingerstick blood sugar while there. Please call or send [place] a message when you get home with the reading you got so we can compare to what the lab got and verify your meter is in range. Do not ask lab staff to check your meter, they do not know how!   Self-monitoring of blood glucose: We recommend that you monitor your blood glucose before breakfast, lunch, dinner and bedtime, and as needed for signs and/or symptoms of hypoglycemia. Please fax, email, drop off or MyChartMessage your blood sugar readings for further adjustment of regimen  1-2 weeks or sooner if problems with low glucose  For more information about continuous glucose sensor devices, please contact your  insurance provider and visit the following websites:   Libre:  [email or web address]  Insulin:  Increase Lantus to 67 units daily. Reduce Novolog at lunch by 6 units.  Diabetic Medication: Continue your current  diabetic medication regimen unchanged.   Hypoglycemia prevention and treatment:  Please carry fast-acting carbohydrate source (preferably glucose tablets) with you at all times. Please treat low blood sugar with 15 grams carbohydrate (4 glucose tablets), wait 15 minutes, and recheck sugar. Re-treat if necessary.   Please notify office for any episode of severe hypoglycemia, such as those requiring assistance from someone else.   Please obtain medical identification indicating that you have diabetes, and wear this all of the time.   Diet: Please watch your portions.  Please aim for a consistent carbohydrate intake at each meal. Please try to avoid missing meals. Reduce sodium in diet. Would avoid salting food.  Exercise: In addition to regular exercise, please incorporate 3 minutes light activity for every 30 minutes of sedentary activity in your day.  Try gentle walking for 10 minutes immediately after each meal to help minimize meal-related increase in blood sugar.  Foot care:  Please examine feet daily and appropriately treat callouses or lesions. Please avoid being barefoot or wearing of ill fitting shoes. Please use lotion as needed to treat dry skin on feet.  Maintain follow-up with your podiatrist.  Diabetic Education:  I have recommended that you meet with the nurse educator and dietition to review  Comprehensive Diabetes Education Program  If you do not hear back from the Diabetes Management Program about scheduling your appointment within 1 week, please call their office directly. [facility] DMP: [phone]; [facility] DMP [phone]; [facility] DMP: [phone]; [place] DMP: [place] DMP: [phone]; [place] DMP: [phone]   Diabetic screening is up-to-date except for a dental exam. Please schedule an appointment with your dentist.  You may wish to consider sedation dentistry. One provider is Dr. Burnadette Peter at [facility], [phone].    The following additional instructions  relate to health conditions other than your diabetes:   Vitamin D: Your vitamin D level is low.  Start taking vitamin D  at a dose of 2000 units.   Cholesterol/lipids: Your cholesterol is in good range but your triclyerides are elevated. We have reviewed that improvement in diabetic control and decreased intake of "simple" sugars (including fruit, fruit juice, other sweet drinks and sugary foods) can help to improve your triglycerides.    Blood Pressure: Your blood pressure is well controlled on the current medication regimen and we will continue this.   Tobacco use:  We have reviewed the health risks associated with tobacco today. Please stop using tobacco products!  The Freedom From Smoking program is FREE and is open to [facility] patients as well as community members. Course dates, times and locations can be found at [email or web address], and when you, your friend or family member is ready to quit, you can register for a class by calling [phone].      Return in about 6 weeks (around 08/07/2017).    Annamaria Boots, NP

## 2017-06-26 NOTE — Patient Instructions (Addendum)
Below is a summary of information and instructions we reviewed at today's appointment. Please review this information carefully and call if you have any questions regarding it.     Diabetes information/instructions:   Labwork: You are due for lab work  on or after 08/11/17.   You should fast for 10 hours prior to having this labwork done.  Please take your meter to the lab and check your fingerstick blood sugar while there. Please call or send [place] a message when you get home with the reading you got so we can compare to what the lab got and verify your meter is in range. Do not ask lab staff to check your meter, they do not know how!   Self-monitoring of blood glucose: We recommend that you monitor your blood glucose before breakfast, lunch, dinner and bedtime, and as needed for signs and/or symptoms of hypoglycemia. Please fax, email, drop off or MyChartMessage your blood sugar readings for further adjustment of regimen  1-2 weeks or sooner if problems with low glucose  For more information about continuous glucose sensor devices, please contact your insurance provider and visit the following websites:   Libre:  [email or web address]/CGM/Info  Insulin:  Increase Lantus to 67 units daily. Reduce Novolog at lunch by 6 units.  Diabetic Medication: Continue your current diabetic medication regimen unchanged.   Hypoglycemia prevention and treatment:  Please carry fast-acting carbohydrate source (preferably glucose tablets) with you at all times. Please treat low blood sugar with 15 grams carbohydrate (4 glucose tablets), wait 15 minutes, and recheck sugar. Re-treat if necessary.   Please notify office for any episode of severe hypoglycemia, such as those requiring assistance from someone else.   Please obtain medical identification indicating that you have diabetes, and wear this all of the time.   Diet: Please watch your portions.  Please aim for a consistent carbohydrate intake at each meal. Please try to avoid  missing meals. Reduce sodium in diet. Would avoid salting food.  Exercise: In addition to regular exercise, please incorporate 3 minutes light activity for every 30 minutes of sedentary activity in your day.  Try gentle walking for 10 minutes immediately after each meal to help minimize meal-related increase in blood sugar.  Foot care:  Please examine feet daily and appropriately treat callouses or lesions. Please avoid being barefoot or wearing of ill fitting shoes. Please use lotion as needed to treat dry skin on feet.  Maintain follow-up with your podiatrist.  Diabetic Education:  I have recommended that you meet with the nurse educator and dietition to review  Comprehensive Diabetes Education Program  If you do not hear back from the Diabetes Management Program about scheduling your appointment within 1 week, please call their office directly. [facility] DMP: [phone]; [facility] DMP [phone]; [facility] DMP: [phone]; [place] DMP: [place] DMP: [phone]; [place] DMP: [phone]   Diabetic screening is up-to-date except for a dental exam. Please schedule an appointment with your dentist.  You may wish to consider sedation dentistry. One provider is Dr. Burnadette Peter at [facility], [phone].    The following additional instructions relate to health conditions other than your diabetes:   Vitamin D: Your vitamin D level is low.  Start taking vitamin D  at a dose of 2000 units.   Cholesterol/lipids: Your cholesterol is in good range but your triclyerides are elevated. We have reviewed that improvement in diabetic control and decreased intake of "simple" sugars (including fruit, fruit juice, other sweet drinks and sugary foods) can  help to improve your triglycerides.    Blood Pressure: Your blood pressure is well controlled on the current medication regimen and we will continue this.   Tobacco use:  We have reviewed the health risks associated with tobacco today. Please stop using  tobacco products!  The Freedom From Smoking program is FREE and is open to [facility] patients as well as community members. Course dates, times and locations can be found at [email or web address], and when you, your friend or family member is ready to quit, you can register for a class by calling [phone].

## 2017-06-27 ENCOUNTER — Encounter (RURAL_HEALTH_CENTER): Payer: Self-pay | Admitting: Family

## 2017-06-27 LAB — PROTEIN / CREATININE RATIO, URINE
Protein/Creatinine Ratio: 13.31 Ratio — ABNORMAL HIGH (ref 0.00–0.09)
Urine Creatinine Random: 73.88 mg/dL
Urine Protein: 983.3 mg/dL — ABNORMAL HIGH (ref 0.0–14.0)

## 2017-06-29 ENCOUNTER — Telehealth (RURAL_HEALTH_CENTER): Payer: Self-pay | Admitting: Family

## 2017-06-29 NOTE — Telephone Encounter (Signed)
Please contact patient re: unread message in MyChart'

## 2017-06-29 NOTE — Telephone Encounter (Signed)
5/16 lmtcb

## 2017-06-30 NOTE — Telephone Encounter (Signed)
LMTCB on Cell VM [phone] or review and reply to MyChart

## 2017-07-03 NOTE — Telephone Encounter (Signed)
5/20 lmtcb

## 2017-07-04 NOTE — Telephone Encounter (Signed)
LMTCB on Tracy's VM [phone]

## 2017-07-05 ENCOUNTER — Other Ambulatory Visit (RURAL_HEALTH_CENTER): Payer: Self-pay | Admitting: Family

## 2017-07-05 DIAGNOSIS — E1121 Type 2 diabetes mellitus with diabetic nephropathy: Secondary | ICD-10-CM

## 2017-07-05 DIAGNOSIS — Z794 Long term (current) use of insulin: Secondary | ICD-10-CM

## 2017-07-05 DIAGNOSIS — E1165 Type 2 diabetes mellitus with hyperglycemia: Secondary | ICD-10-CM

## 2017-07-05 NOTE — Telephone Encounter (Signed)
Spoke with wife and gave MyChart message  Agreeable to Kidney specialist referral. She will let Dhairya know information.

## 2017-07-05 NOTE — Telephone Encounter (Signed)
FYI Referral placed. 

## 2017-07-06 NOTE — Progress Notes (Signed)
Note reviewed. Agree with assessment and plan as outlined by Ms. Costin, FNP-BC below.  Reesha Debes A. Shiasia Porro, MD, FACP, FACE

## 2017-07-24 ENCOUNTER — Telehealth (RURAL_HEALTH_CENTER): Payer: Self-pay

## 2017-07-24 NOTE — Telephone Encounter (Signed)
6/10 lmtcb

## 2017-07-24 NOTE — Telephone Encounter (Signed)
Please call the patient regarding blood sugar readings received with the following message: Blood sugar readings from 06/27/17 to 07/18/17 received and reviewed.  Blood sugars above target fasting.  Is he snacking at bedtime?  If so, what is he having?  What time is he taking his Lantus?Marland Kitchen

## 2017-07-25 NOTE — Telephone Encounter (Signed)
Spoke with patient and he verbalized understanding.

## 2017-07-25 NOTE — Telephone Encounter (Signed)
Can leave detailed on VM   Spoke with patient and he states not snacking  Takes Lantus around 7-8pm

## 2017-07-25 NOTE — Telephone Encounter (Signed)
Thanks for update.  Please have him increase the Lantus to 70 units (from 67 units) to try to get fasting readings down.

## 2017-08-07 ENCOUNTER — Telehealth (RURAL_HEALTH_CENTER): Payer: Self-pay

## 2017-08-07 NOTE — Telephone Encounter (Signed)
6/24 lmtcb

## 2017-08-07 NOTE — Telephone Encounter (Signed)
Patient aware and understands  Also let him know that if he experiences lows again to let us know

## 2017-08-07 NOTE — Telephone Encounter (Signed)
Please call the patient regarding blood sugar readings received with the following message: Blood sugar readings from 07/19/17 to 07/29/17 received and reviewed.  Blood sugars good overall but in low target range at times before supper. Please decrease lunch Novolog insulin by 1 unit and  send glucose readings again in 2 weeks.

## 2017-08-24 ENCOUNTER — Telehealth (RURAL_HEALTH_CENTER): Payer: Self-pay

## 2017-08-24 NOTE — Telephone Encounter (Signed)
I called the spouse regarding blood sugar readings from 07/30/17 to 08/20/17 received and reviewed, left voice mail.  Blood sugars above target fasting some days but low to low target range before supper at times.. Please decrease lunch Novolog insulin to 1 unit.  Please try walking 10 minutes after supper to help target fasting readings and continue sending glucose readings periodically.

## 2017-09-11 ENCOUNTER — Other Ambulatory Visit (RURAL_HEALTH_CENTER): Payer: Self-pay

## 2017-09-11 DIAGNOSIS — E1165 Type 2 diabetes mellitus with hyperglycemia: Secondary | ICD-10-CM

## 2017-09-11 DIAGNOSIS — Z794 Long term (current) use of insulin: Secondary | ICD-10-CM

## 2017-09-11 MED ORDER — FREESTYLE LIBRE 14 DAY SENSOR MISC
1.00 | 0 refills | Status: DC
Start: ? — End: 2017-09-11

## 2017-09-11 NOTE — Telephone Encounter (Signed)
Please call the spouse Linus Orn [phone]) regarding blood sugar readings received with the following message: Blood sugar readings from 08/21/17 to 08/31/17 received and reviewed.  Blood sugars above target fasting but often in low target range before supper. Please decrease lunch Novolog insulin by 2 units, try to be active after supper, and bring glucose readings to next visit.

## 2017-09-11 NOTE — Telephone Encounter (Signed)
Patient aware and understands

## 2017-09-11 NOTE — Telephone Encounter (Signed)
Patient request refill of his sensor  Patient will have labs done prior to his 8/20 appt

## 2017-09-13 ENCOUNTER — Ambulatory Visit
Admission: RE | Admit: 2017-09-13 | Discharge: 2017-09-13 | Disposition: A | Discharge status: Home / Self Care | Payer: BC Managed Care – PPO | Source: Ambulatory Visit | Attending: Internal Medicine | Admitting: Internal Medicine

## 2017-09-13 DIAGNOSIS — E559 Vitamin D deficiency, unspecified: Secondary | ICD-10-CM | POA: Insufficient documentation

## 2017-09-13 DIAGNOSIS — Z794 Long term (current) use of insulin: Secondary | ICD-10-CM | POA: Insufficient documentation

## 2017-09-13 DIAGNOSIS — E1165 Type 2 diabetes mellitus with hyperglycemia: Secondary | ICD-10-CM | POA: Insufficient documentation

## 2017-09-13 DIAGNOSIS — E782 Mixed hyperlipidemia: Secondary | ICD-10-CM | POA: Insufficient documentation

## 2017-09-13 LAB — COMPREHENSIVE METABOLIC PANEL
ALT: 41 U/L (ref 0–55)
AST (SGOT): 27 U/L (ref 10–42)
Albumin/Globulin Ratio: 0.9 Ratio (ref 0.80–2.00)
Albumin: 3.1 gm/dL — ABNORMAL LOW (ref 3.5–5.0)
Alkaline Phosphatase: 111 U/L (ref 40–145)
Anion Gap: 16.1 mMol/L (ref 7.0–18.0)
BUN / Creatinine Ratio: 23.3 Ratio (ref 10.0–30.0)
BUN: 28 mg/dL — ABNORMAL HIGH (ref 7–22)
Bilirubin, Total: 0.2 mg/dL (ref 0.1–1.2)
CO2: 24.8 mMol/L (ref 20.0–30.0)
Calcium: 9.6 mg/dL (ref 8.5–10.5)
Chloride: 105 mMol/L (ref 98–110)
Creatinine: 1.2 mg/dL (ref 0.80–1.30)
EGFR: 68 mL/min/{1.73_m2} (ref 60–150)
Globulin: 3.4 gm/dL (ref 2.0–4.0)
Glucose: 87 mg/dL (ref 71–99)
Osmolality Calc: 286 mOsm/kg (ref 275–300)
Potassium: 4.5 mMol/L (ref 3.5–5.3)
Protein, Total: 6.5 gm/dL (ref 6.0–8.3)
Sodium: 141 mMol/L (ref 136–147)

## 2017-09-13 LAB — LIPID PANEL
Cholesterol: 158 mg/dL (ref 75–199)
Coronary Heart Disease Risk: 5.85
HDL: 27 mg/dL — ABNORMAL LOW (ref 40–55)
LDL Calculated: 61 mg/dL
Triglycerides: 349 mg/dL — ABNORMAL HIGH (ref 10–150)
VLDL: 70 — ABNORMAL HIGH (ref 0–40)

## 2017-09-13 LAB — VH URINALYSIS WITH MICROSCOPIC AND CULTURE IF INDICATED
Bilirubin, UA: NEGATIVE mg/dL
Glucose, UA: NEGATIVE mg/dL
Ketones UA: NEGATIVE mg/dL
Leukocyte Esterase, UA: NEGATIVE Leu/uL
Nitrite, UA: NEGATIVE
Protein, UR: >=300 mg/dL — AB
Urine Specific Gravity: 1.025 (ref 1.001–1.040)
Urobilinogen, UA: 0.2 mg/dL
pH, Urine: 6.5 pH (ref 5.0–8.0)

## 2017-09-13 LAB — CBC AND DIFFERENTIAL
Basophils %: 0.8 % (ref 0.0–3.0)
Basophils Absolute: 0.1 10*3/uL (ref 0.0–0.3)
Eosinophils %: 3.3 % (ref 0.0–7.0)
Eosinophils Absolute: 0.4 10*3/uL (ref 0.0–0.8)
Hematocrit: 36.7 % — ABNORMAL LOW (ref 39.0–52.5)
Hemoglobin: 12.6 gm/dL — ABNORMAL LOW (ref 13.0–17.5)
Lymphocytes Absolute: 2.7 10*3/uL (ref 0.6–5.1)
Lymphocytes: 24 % (ref 15.0–46.0)
MCH: 30 pg (ref 28–35)
MCHC: 34 gm/dL (ref 31–36)
MCV: 87 fL (ref 80–100)
MPV: 9.1 fL (ref 6.0–10.0)
Monocytes Absolute: 0.7 10*3/uL (ref 0.1–1.7)
Monocytes: 6.4 % (ref 3.0–15.0)
Neutrophils %: 65.6 % (ref 42.0–78.0)
Neutrophils Absolute: 7.2 10*3/uL (ref 1.7–8.6)
PLT CT: 191 10*3/uL (ref 130–440)
RBC: 4.24 10*6/uL (ref 4.00–5.70)
RDW: 12.1 % (ref 10.5–14.5)
WBC: 11 10*3/uL (ref 4.0–11.0)

## 2017-09-13 LAB — HEPATITIS PANEL, ACUTE
Hepatitis A IgM: NONREACTIVE
Hepatitis B Core IgM: NONREACTIVE
Hepatitis B Surface Antigen: NONREACTIVE
Hepatitis C Antibody: NONREACTIVE

## 2017-09-13 LAB — BASIC METABOLIC PANEL
Anion Gap: 15.4 mMol/L (ref 7.0–18.0)
BUN / Creatinine Ratio: 23 Ratio (ref 10.0–30.0)
BUN: 28 mg/dL — ABNORMAL HIGH (ref 7–22)
CO2: 25.6 mMol/L (ref 20.0–30.0)
Calcium: 9.7 mg/dL (ref 8.5–10.5)
Chloride: 104 mMol/L (ref 98–110)
Creatinine: 1.22 mg/dL (ref 0.80–1.30)
EGFR: 67 mL/min/{1.73_m2} (ref 60–150)
Glucose: 87 mg/dL (ref 71–99)
Osmolality Calc: 286 mOsm/kg (ref 275–300)
Potassium: 4.5 mMol/L (ref 3.5–5.3)
Sodium: 141 mMol/L (ref 136–147)

## 2017-09-13 LAB — CREATININE, URINE, RANDOM: Urine Creatinine Random: 74 mg/dL

## 2017-09-13 LAB — C3 COMPLEMENT: Complement C3: 171 mg/dL (ref 82–185)

## 2017-09-13 LAB — HEMOGLOBIN A1C: Hgb A1C, %: 7.1 %

## 2017-09-13 LAB — ALT: ALT: 41 U/L (ref 0–55)

## 2017-09-13 LAB — PHOSPHORUS: Phosphorus: 3.9 mg/dL (ref 2.3–4.7)

## 2017-09-13 LAB — URIC ACID: Uric acid: 7.5 mg/dL — ABNORMAL HIGH (ref 3.5–7.2)

## 2017-09-13 LAB — SEDIMENTATION RATE: Sed Rate: 55 mm/hr — ABNORMAL HIGH (ref 0–20)

## 2017-09-13 LAB — PTH, INTACT: PTH Intact: 18.8 pg/mL — ABNORMAL LOW (ref 20.0–83.0)

## 2017-09-13 LAB — FERRITIN: Ferritin: 90 ng/mL (ref 21.8–274.6)

## 2017-09-13 LAB — C4 COMPLEMENT: Complement 4: 35 mg/dL (ref 15–53)

## 2017-09-13 LAB — TSH: TSH: 1.59 u[IU]/mL (ref 0.40–4.20)

## 2017-09-13 LAB — B-TYPE NATRIURETIC PEPTIDE: B-Natriuretic Peptide: 10.2 pg/mL (ref 0.0–100.0)

## 2017-09-13 LAB — IRON PROFILE
% Saturation: 15 % (ref 15–50)
Iron: 59.7 ug/dL (ref 50.0–175.0)
TIBC: 392 ug/dL (ref 250–450)
Transferrin: 280 mg/dL (ref 174.0–364.0)

## 2017-09-14 ENCOUNTER — Encounter: Payer: Self-pay | Admitting: Internal Medicine

## 2017-09-14 DIAGNOSIS — N189 Chronic kidney disease, unspecified: Secondary | ICD-10-CM

## 2017-09-14 LAB — VITAMIN B12 AND FOLATE
Folate: 12.4 ng/mL (ref 7.0–19.9)
Vitamin B-12: 419 pg/mL (ref 213–816)

## 2017-09-14 LAB — RHEUMATOID FACTOR: Rheumatoid Factor: <15 IU/mL (ref 0.0–29.9)

## 2017-09-14 LAB — C-REACTIVE PROTEIN: C-Reactive Protein: 0.11 mg/dL (ref 0.02–0.80)

## 2017-09-14 LAB — VITAMIN D,25 OH,TOTAL: Vitamin D 25-Hydroxy: 21 ng/mL — ABNORMAL LOW (ref 30–80)

## 2017-09-14 LAB — PROTEIN, URINE, RANDOM: Urine Protein: 466 mg/dL — ABNORMAL HIGH (ref 0–14)

## 2017-09-14 NOTE — Progress Notes (Signed)
Results reviewed. Will address at upcoming appointment.

## 2017-09-15 LAB — URINE IMMUNOFIXATION

## 2017-09-15 LAB — COMPLEMENT, TOTAL: Complement, Total (CH50): >60 U/mL — ABNORMAL HIGH (ref 31–60)

## 2017-09-15 LAB — ANA SCREEN ONLY: ANA Screen: NEGATIVE

## 2017-09-17 LAB — VH PROTEIN ELECTROPHORESIS RANDOM URINE
Albumin, UR: 65 %
Alpha-1 Globulin: 6 %
Beta Globulin: 14 %
CSF Alpha-2 Globulin: 8 %
Gamma Globulin: 8 %
Protein Urine Random: 491 mg/dL — ABNORMAL HIGH (ref 5–25)
Protein/Creatinine Ratio: 6726 mg/g creat — ABNORMAL HIGH (ref 22–128)
Urine Creatinine Random: 73 mg/dL (ref 20–320)

## 2017-09-17 NOTE — Progress Notes (Signed)
Please call the patient with the following information:  A1c much improved.  Results will be reviewed in detail at his appointment in a few weeks.  This note was generated using speech recognition software and may contain unintended errors in grammar, spelling or content. Please ask me for clarification if questions about its content prior to calling patient.

## 2017-09-18 ENCOUNTER — Ambulatory Visit
Admission: RE | Admit: 2017-09-18 | Discharge: 2017-09-18 | Disposition: A | Discharge status: Home / Self Care | Payer: BC Managed Care – PPO | Source: Ambulatory Visit | Attending: Internal Medicine | Admitting: Internal Medicine

## 2017-09-18 DIAGNOSIS — N189 Chronic kidney disease, unspecified: Secondary | ICD-10-CM | POA: Insufficient documentation

## 2017-09-18 DIAGNOSIS — E1122 Type 2 diabetes mellitus with diabetic chronic kidney disease: Secondary | ICD-10-CM | POA: Insufficient documentation

## 2017-09-18 LAB — DNA (DS) ANTIBODY, DOUBLE STRANDED: DNA (ds) Double Stranded Antibody: <1 IU/mL (ref <=4)

## 2017-09-18 LAB — PROTEINASE 3 AB (PR3), S: Proteinase 3 Antibody: <1 AI (ref <1.0)

## 2017-09-18 NOTE — Progress Notes (Signed)
Left detailed message on Cell VM [phone]

## 2017-09-19 ENCOUNTER — Encounter (RURAL_HEALTH_CENTER): Payer: Self-pay | Admitting: Physician Assistant

## 2017-09-19 ENCOUNTER — Ambulatory Visit: Payer: BC Managed Care – PPO | Attending: Physician Assistant | Admitting: Physician Assistant

## 2017-09-19 VITALS — BP 118/66 | HR 97 | Temp 98.3°F | Wt 252.4 lb

## 2017-09-19 DIAGNOSIS — K047 Periapical abscess without sinus: Secondary | ICD-10-CM

## 2017-09-19 LAB — IMMUNOFIXATION ELECTROPHORESIS

## 2017-09-19 LAB — PROTEIN ELECTRO,W-TPROT,SERUM
Albumin: 3 g/dL — ABNORMAL LOW (ref 3.8–4.8)
Alpha-1 Globulin: 0.3 g/dL (ref 0.2–0.3)
Beta 1 Globulin: 0.5 g/dL (ref 0.4–0.6)
Beta 2 Globulin: 0.4 g/dL (ref 0.2–0.5)
CSF Alpha-2 Globulin: 1.1 g/dL — ABNORMAL HIGH (ref 0.5–0.9)
Gamma Globulin: 0.7 g/dL — ABNORMAL LOW (ref 0.8–1.7)
Protein, Total: 5.9 g/dL — ABNORMAL LOW (ref 6.1–8.1)

## 2017-09-19 LAB — VH 25-HYDROXYVITAMIN D2 AND D3-S
25-Hydroxy D Total: 20 ng/mL
25-Hydroxy D2: <4 ng/mL
25-Hydroxy D3: 20 ng/mL

## 2017-09-19 MED ORDER — AMOXICILLIN-POT CLAVULANATE 875-125 MG PO TABS
1.0000 | ORAL_TABLET | Freq: Two times a day (BID) | ORAL | 0 refills | Status: AC
Start: 2017-09-19 — End: 2017-09-29

## 2017-09-19 NOTE — Progress Notes (Addendum)
Subjective:    Patient ID: [name] is a 52 y.o. male.    Dental Pain    This is a recurrent problem. The current episode started yesterday. The problem occurs constantly. The problem has been unchanged. The pain is moderate. Associated symptoms include facial pain and sinus pressure. Pertinent negatives include no fever, oral bleeding or thermal sensitivity.   has apt with dentist next week.  This is a recurrent problem.    The following portions of the patient's history were reviewed and updated as appropriate: allergies, current medications, past medical history, past social history and problem list.    Review of Systems   Constitutional: Negative for diaphoresis, fatigue and fever.   HENT: Positive for dental problem and sinus pressure. Negative for congestion and sinus pain.    Neurological: Positive for headaches.           Objective:    Physical Exam   Constitutional: He appears well-developed and well-nourished. No distress.   HENT:   Head: Normocephalic and atraumatic.   Right Ear: External ear normal.   Left Ear: External ear normal.   Mouth/Throat: No oral lesions. No trismus in the jaw.       No focal maxillary or mandibular tenderness.   Eyes: Conjunctivae and EOM are normal. No scleral icterus.   Neck: Neck supple.   Cardiovascular: Normal rate.    Pulmonary/Chest: Effort normal. No respiratory distress.   Neurological: He is alert.   Skin: Skin is warm and dry. Capillary refill takes less than 2 seconds. No rash noted. He is not diaphoretic. No erythema. No pallor.   Psychiatric: He has a normal mood and affect. His behavior is normal. Judgment and thought content normal.     Vitals:    09/19/17 1714   BP: 118/66   BP Site: Right arm   Patient Position: Sitting   Cuff Size: Large   Pulse: 97   Temp: 98.3 F (36.8 C)   TempSrc: Oral   SpO2: 98%   Weight: 114.5 kg (252 lb 6.4 oz)           Assessment:       1. Dental abscess          Plan:       1. Dental abscess  - amoxicillin-clavulanate  (AUGMENTIN) 875-125 MG per tablet; Take 1 tablet by mouth 2 (two) times daily for 10 days  Dispense: 20 tablet; Refill: 0    - Discussed risks/benefits of over-the-counter options/prescriptions as well as educated on therapies advised.  - discussed signs and symptoms that warrant immediate medical attention in the ER, patient clearly understands.  - Follow up with primary care doctor within the next week or sooner if symptoms are not improving.    Signed: Lynda Wanninger PA-C, 09/19/2017, 5:25 PM

## 2017-09-23 LAB — MYELOPEROXIDASE ANTIBODY: Myeloperoxidase AB: <1 AI (ref <1.0)

## 2017-10-02 ENCOUNTER — Telehealth (RURAL_HEALTH_CENTER): Payer: Self-pay

## 2017-10-02 NOTE — Telephone Encounter (Signed)
Please call the patient regarding blood sugar readings received with the following message: Blood sugar readings from 09/01/17 to 09/22/17 received and reviewed.  Overall blood sugars look good. Continue the current regimen with no change and  Bring updated glucose readings to visit tomorrow (10/03/17).

## 2017-10-02 NOTE — Telephone Encounter (Signed)
Left detailed message and if any questions to contact the office

## 2017-10-03 ENCOUNTER — Encounter (RURAL_HEALTH_CENTER): Payer: Self-pay | Admitting: Family

## 2017-10-03 ENCOUNTER — Ambulatory Visit: Payer: BC Managed Care – PPO | Attending: Family | Admitting: Family

## 2017-10-03 VITALS — BP 128/78 | HR 88 | Resp 18 | Wt 247.0 lb

## 2017-10-03 DIAGNOSIS — Z794 Long term (current) use of insulin: Secondary | ICD-10-CM

## 2017-10-03 DIAGNOSIS — I10 Essential (primary) hypertension: Secondary | ICD-10-CM

## 2017-10-03 DIAGNOSIS — Z6836 Body mass index (BMI) 36.0-36.9, adult: Secondary | ICD-10-CM

## 2017-10-03 DIAGNOSIS — E1165 Type 2 diabetes mellitus with hyperglycemia: Secondary | ICD-10-CM

## 2017-10-03 DIAGNOSIS — E782 Mixed hyperlipidemia: Secondary | ICD-10-CM

## 2017-10-03 MED ORDER — LANTUS SOLOSTAR 100 UNIT/ML SC SOPN
65.00 [IU] | PEN_INJECTOR | Freq: Every day | SUBCUTANEOUS | 1 refills | Status: DC
Start: ? — End: 2017-10-03

## 2017-10-03 MED ORDER — INSULIN ASPART 100 UNIT/ML SC SOPN
PEN_INJECTOR | SUBCUTANEOUS | 1 refills | Status: DC
Start: ? — End: 2017-10-03

## 2017-10-03 MED ORDER — FREESTYLE LIBRE 14 DAY SENSOR MISC
1.00 | 3 refills | Status: DC
Start: ? — End: 2017-10-03

## 2017-10-03 NOTE — Patient Instructions (Signed)
Below is a summary of information and instructions we reviewed at today's appointment. Please review this information carefully and call if you have any questions regarding it.     Diabetes information/instructions:   Labwork: You are due for lab work  on or after 12/14/17.   You should fast for 10 hours prior to having this labwork done.  Please take your meter to the lab and check your fingerstick blood sugar while there. Please call or send [place] a message when you get home with the reading you got so we can compare to what the lab got and verify your meter is in range. Do not ask lab staff to check your meter, they do not know how!   Self-monitoring of blood glucose: We recommend that you monitor your blood glucose before breakfast, lunch, dinner and bedtime, and as needed for signs and/or symptoms of hypoglycemia. Please fax, email, drop off or MyChartMessage your blood sugar readings for further adjustment of regimen  in one week  Insulin:  Reduce Novolog at lunch by 4 units.  Hypoglycemia prevention and treatment:  Please carry fast-acting carbohydrate source (preferably glucose tablets) with you at all times. Please treat low blood sugar with 15 grams carbohydrate (4 glucose tablets), wait 15 minutes, and recheck sugar. Re-treat if necessary.   Please notify office for any episode of severe hypoglycemia, such as those requiring assistance from someone else.   Diet: Please watch your portions.  Please aim for a consistent carbohydrate intake at each meal. Please try to avoid missing meals.  Exercise: We have recommended that you begin an exercise regimen.   Foot care:  Please examine feet daily and appropriately treat callouses or lesions. Please avoid being barefoot or wearing of ill fitting shoes. Please use lotion as needed to treat dry skin on feet.   Diabetic screening is up-to-date except for an eye exam and a dental exam.  It is important to have your eyes examined every year and to have regular dental  care.     The following additional instructions relate to health conditions other than your diabetes:   Vitamin D: Your vitamin D level is in the ideal range. Please continue the current dose of vitamin D.  Tobacco use:  Congratulations on quitting! please continue not to smoke!

## 2017-10-03 NOTE — Progress Notes (Signed)
Endocrine Provider Progress Note      Patient Name:  [name] [[id number]] DOB: December 17, 1965  Date: 10/03/2017    Subjective:          [name] is a 52 y.o. male who presents for follow up of type 2 diabetes mellitus.   Since the last visit, the patient has been well overall except for recent sinus congestion.  The patient quit smoking two weeks ago! He just joined a gym and would like gym form signed for clearance for exercise.    Monitoring, Compliance and Complications:  Diagnosis Date: ~2000-2001  Prior Diabetic Treatment: Wilder Glade (discontinued due to renal function)  Hgb A1c at time of referral: 11.1% (done 12/20/16). A1c drawn today prior to appointment  HGB A1C goal based on age and comorbidities is 7%.  The patient has the following diabetic complications:  retinopathy  He has had multiple surgeries for osteomyelitis on his left foot in 2017.     Glucose Meter: Reli-On, not sure which model  Glucose Meter Validation Date: never  The patient did not bring the glucose meter or glucose readings today.  Compliance with blood glucose monitoring: fair   The patient is not currently testing home blood glucose.   Glucose readings are as follows:  -      CGM Type:  Freestyle Libre  CGM use fequency:  all of the time   CGM Purchase Date: 2019  Average sensor glucose: 152  Percent of time in target range: 25  Percent of time above target range: 48  Percent of time below target range: 27 but target is 130-150  Scanned readings 8/19-820 AM 186,187 afternoon: 96-207 evening 102-150, night 126-190  Unable to download data for review in office. Did review graphs/data from patient's reader. Overnight glucose tracings are variable.    Injections:   Injections are given by patient.   Injections sites include: abdominal wall  Injection compliance: The patient never misses injections.  No problems noted with injection sites.   Novolog per correction scale: about 16 units at breakfast, 12 units lunch, less than 16  before dinner    Diet, Exercise, and Weight:     The patient follows no particular diet.  Compliance with diet has been poor.   Exercise: The patient is unable to exercise due to previous feet surgery.  Weight: Weight has been stable since last visit.    Hypoglycemia:   The patient has experienced mild occasional hypoglycemia since the last visit. Symptoms of hypoglycemia include: shaking vision change . The patient treats hypoglycemia with glucose tablets  The patient does not have a Glucagon Emergency Kit.  The patient does not have a medical alert bracelet or necklace.    Diabetic Education: The patient has never received diabetes education.  due to insurance not covering much cost.         Diabetic Complication Screening/Treatment  Eye exam: Dr. Andres Ege [facility]". Last visit was sometime in 2018.  No evidence of diabetic retinopathy on last eye exam.  Urine microalbumin/creatinine ratio: N/A. The patient has CKD followed by nephrologist.  Dr. Elby Beck  Foot Care: The patient reports no new foot problems. The patient checks the feet regularly.  The patient cuts toenails without difficulty.   Monofilament: 05/11/17, partially intact Overdue for follow up with podiatrist  Aspirin Therapy: The patient is currently taking aspirin at dose indicated in the medication list.   Cardiovascular risk factors: diabetes mellitus, dyslipidemia, hypertension, male gender, obesity (BMI >= 30 kg/m2),  sedentary lifestyle and smoking/ tobacco exposure  Ace Inhibitor/ARB: The patient is taking ACE-I/ARB at dose indicated in medication list and is tolerating this well.  Dental care: Patient has not received dental care in past year due to "bad experience as a child, have phobia"     Current Outpatient Prescriptions   Medication Sig Dispense Refill   . aspirin EC 81 MG EC tablet Take 1 tablet (81 mg total) by mouth every evening. 90 tablet 3   . atorvastatin (LIPITOR) 40 MG tablet Take 1 tablet (40 mg total) by mouth daily. 90 tablet 3    . Blood Glucose Monitoring Suppl (RELION PRIME MONITOR) Device by Does not apply route USE TO TEST BLOOD GLUCOSE FOUR TIMES DAILY     . Cholecalciferol (VITAMIN D) 2000 units tablet Take 2,000 Units by mouth daily     . Continuous Blood Gluc Receiver (FREESTYLE LIBRE 14 DAY READER) Device 1 each by Does not apply route 4 (four) times daily 1 each 3   . Continuous Blood Gluc Sensor (FREESTYLE LIBRE 14 DAY SENSOR) Misc 1 each by Does not apply route every 14 (fourteen) days 2 each 0   . gabapentin (NEURONTIN) 300 MG capsule TAKE ONE CAPSULE BY MOUTH THREE TIMES DAILY 90 capsule 11   . glucose blood (RELION PRIME TEST) test strip 1 each by Other route 4 (four) times daily Use as instructed     . insulin aspart (NOVOLOG FLEXPEN) 100 UNIT/ML injection pen INJECT PER SLIDING SCALE MAX 54 UNITS PER DAY (Patient taking differently: INJECT PRIOR TO MEALS PER CORRECTION SCALE UP TO 54 UNITS DAILY   ) 60 mL 1   . insulin glargine (LANTUS SOLOSTAR) 100 UNIT/ML injection pen Inject 67 Units into the skin daily (Patient taking differently: Inject 65 Units into the skin daily    ) 75 mL 1   . Insulin Pen Needle 31G X 6 MM Misc by Does not apply route.Use up to 7 times a day as directed     . liraglutide (VICTOZA) 18 MG/3ML injection Inject 1.8 mg into the skin daily 18 mL 1   . lisinopril (PRINIVIL,ZESTRIL) 20 MG tablet TAKE 2 TABLETS BY MOUTH NIGHTLY 180 tablet 3     No current facility-administered medications for this visit.       Medication review with [name] suggested compliance all of the time.       Immunization History   Administered Date(s) Administered   . Td 06/14/2009     The following portions of the patient's history were reviewed and updated as appropriate: allergies, current medications, past family history, past medical history, past social history, past surgical history and problem list.  The following information was also reviewed at today's visit: office notes from referring provider and lab  data    Review of Systems  As above.    Positive for nasal congestion attributed to allergies, productive cough , improving "normal color" sputum   Negative for CP     Objective:      Vital Signs: BP 128/78   Pulse 88   Resp 18   Wt 112 kg (247 lb)   BMI 36.88 kg/m   PE  Well nourished, well appearing, in no acute distress.  HEENT:  There is no stare or lid lag. There is no periorbital edema. Extraoccular movements are intact. No conjunctival injection.   Neck is supple without adenopathy.  The thyroid is normal in size and firm in consistency. No nodules are  appreciated. Firmness in left lobe.  Lungs are clear to auscultation.  Cardiac exam reveals a regular rate and rhythm. No murmur or gallop is appreciated.   Exam of the extremities reveals nonpitting edema noted at sock line     There is deformity of the (L) foot, including past surgery.  Healed surgery scars..  Callouses are noted on feet. Toenail hygeine is  fair  moderate dry skin is noted on both feet.    On neurologic exam, the patient is alert and appropriate. Gait and speech are normal. Reflexes not tested.  Skin is cool and dry.      Lab Review    Lab Results   Component Value Date    HGBA1CPERCNT 7.1 09/13/2017    HGBA1CPERCNT 12.3 05/11/2017    HGBA1CPERCNT 11.1 12/19/2016    TSH 1.59 09/13/2017    CHOL 158 09/13/2017    HDL 27 (L) 09/13/2017    LDL 61 09/13/2017    TRIG 349 (H) 09/13/2017    ALT 41 09/13/2017    B12 419 09/13/2017    VITD 21 (L) 09/13/2017    GLU 87 09/13/2017    K 4.5 09/13/2017    CA 9.6 09/13/2017    CREAT 1.20 09/13/2017    CO2 24.8 09/13/2017    EGFR 68 09/13/2017    NA 141 09/13/2017    ALKPHOS 111 09/13/2017      09/13/17 vitamin D2/D3 total 20 (normal 20-50)    Assessment:      1. Type 2 diabetes mellitus with hyperglycemia, with long-term current use of insulin  insulin aspart (NOVOLOG FLEXPEN) 100 UNIT/ML injection pen    Continuous Blood Gluc Sensor (FREESTYLE LIBRE 14 DAY SENSOR) Misc    insulin glargine (LANTUS  SOLOSTAR) 100 UNIT/ML injection pen    Hemoglobin A1C    Lipid panel    ALT    Basic Metabolic Panel   2. BMI 36.0-36.9,adult     3. Essential hypertension     4. Mixed hyperlipidemia       Diabetes: Diabetes is improved. Insulin regimen adjusted. Rationale for improved control discussed with the patient today. Discussed specific areas of diabetes management as detailed under patient instructions below. Form for gym signed.  Lipids: We have reviewed that the triglycerides are about goal. We have discussed that they are likely to improve with decreased intake of simple sugars and improved diabetic control.    BP: Blood pressure is well controlled on the current regimen which is well tolerated and will be continued.    Vitamin D: The vitamin D level is in optimal range on the current dose of vitamin D repletion and this will be continued. We will continue to monitor the vitamin D periodically and adjust the dose as needed.      Plan:   Below is a summary of information and instructions we reviewed at today's appointment. Please review this information carefully and call if you have any questions regarding it.     Diabetes information/instructions:   Labwork: You are due for lab work  on or after 12/14/17.   You should fast for 10 hours prior to having this labwork done.  Please take your meter to the lab and check your fingerstick blood sugar while there. Please call or send [place] a message when you get home with the reading you got so we can compare to what the lab got and verify your meter is in range. Do not ask lab staff to check your meter,  they do not know how!   Self-monitoring of blood glucose: We recommend that you monitor your blood glucose before breakfast, lunch, dinner and bedtime, and as needed for signs and/or symptoms of hypoglycemia. Please fax, email, drop off or MyChartMessage your blood sugar readings for further adjustment of regimen  in one week  Insulin:  Reduce Novolog at lunch by 4  units.  Hypoglycemia prevention and treatment:  Please carry fast-acting carbohydrate source (preferably glucose tablets) with you at all times. Please treat low blood sugar with 15 grams carbohydrate (4 glucose tablets), wait 15 minutes, and recheck sugar. Re-treat if necessary.   Please notify office for any episode of severe hypoglycemia, such as those requiring assistance from someone else.   Diet: Please watch your portions.  Please aim for a consistent carbohydrate intake at each meal. Please try to avoid missing meals.  Exercise: We have recommended that you begin an exercise regimen.   Foot care:  Please examine feet daily and appropriately treat callouses or lesions. Please avoid being barefoot or wearing of ill fitting shoes. Please use lotion as needed to treat dry skin on feet.   Diabetic screening is up-to-date except for an eye exam and a dental exam.  It is important to have your eyes examined every year and to have regular dental care.     The following additional instructions relate to health conditions other than your diabetes:   Vitamin D: Your vitamin D level is in the ideal range. Please continue the current dose of vitamin D.  Tobacco use:  Congratulations on quitting! please continue not to smoke!      Return in about 3 months (around 01/03/2018).    Annamaria Boots, NP

## 2017-10-04 ENCOUNTER — Other Ambulatory Visit (RURAL_HEALTH_CENTER): Payer: Self-pay

## 2017-10-04 DIAGNOSIS — Z794 Long term (current) use of insulin: Secondary | ICD-10-CM

## 2017-10-08 NOTE — Progress Notes (Signed)
Note reviewed. Agree with assessment and plan as outlined by Ms. Costin, FNP-BC below.  Tyleah Loh A. Natash Berman, MD, FACP, FACE

## 2017-10-09 MED ORDER — GABAPENTIN 300 MG PO CAPS
300.00 mg | ORAL_CAPSULE | Freq: Three times a day (TID) | ORAL | 1 refills | Status: DC
Start: 2017-10-09 — End: 2018-05-31

## 2017-10-17 ENCOUNTER — Other Ambulatory Visit (RURAL_HEALTH_CENTER): Payer: Self-pay | Admitting: Family Medicine

## 2017-10-17 DIAGNOSIS — E781 Pure hyperglyceridemia: Secondary | ICD-10-CM

## 2017-10-19 ENCOUNTER — Telehealth (RURAL_HEALTH_CENTER): Payer: Self-pay

## 2017-10-19 NOTE — Telephone Encounter (Signed)
Please call the patient regarding blood sugar readings received with the following message: Blood sugar readings from 09/23/17 to 10/14/17 received and reviewed.  Blood sugars good overall but low or low target range before supper at times. Please decrease lunch Log insulin dose by 2 units and  send glucose readings again in 2 weeks.

## 2017-10-20 NOTE — Telephone Encounter (Signed)
LMTCB on Cell VM [phone]

## 2017-10-23 NOTE — Telephone Encounter (Signed)
Patient aware and understands

## 2017-10-23 NOTE — Telephone Encounter (Signed)
9/9 lmtcb

## 2017-11-02 ENCOUNTER — Other Ambulatory Visit (RURAL_HEALTH_CENTER): Payer: Self-pay | Admitting: Family

## 2017-11-02 DIAGNOSIS — E1165 Type 2 diabetes mellitus with hyperglycemia: Secondary | ICD-10-CM

## 2017-11-02 DIAGNOSIS — Z794 Long term (current) use of insulin: Secondary | ICD-10-CM

## 2017-11-16 ENCOUNTER — Telehealth (RURAL_HEALTH_CENTER): Payer: Self-pay

## 2017-11-16 NOTE — Telephone Encounter (Signed)
Please call the patient regarding blood sugar readings received with the following message: Blood sugar readings from 10/15/17 to 11/05/17 received and reviewed.  Blood sugars good overall but often in low target  before supper . Please decrease lunch Log insulin to 7 units and  send glucose readings again in 2 weeks.

## 2017-11-16 NOTE — Telephone Encounter (Signed)
10/3 lmtcb

## 2017-11-17 NOTE — Telephone Encounter (Signed)
10/4 lmtcb

## 2017-11-17 NOTE — Telephone Encounter (Signed)
Patient aware and understands

## 2017-11-21 ENCOUNTER — Telehealth (RURAL_HEALTH_CENTER): Payer: Self-pay

## 2017-11-21 NOTE — Telephone Encounter (Signed)
Please call the patient regarding blood sugar readings received with the following message: Blood sugar readings from 11/06/17 to 11/16/17 received and reviewed. According to chart, patient received message to decrease lunchtime Log to 7 units on 11/17/17 (which was after readings sent). Will await updated readings on lowe lunchtime dose. Send readings again for review in 2 weeks.

## 2017-11-22 NOTE — Telephone Encounter (Signed)
10/9 lmtcb

## 2017-11-23 NOTE — Telephone Encounter (Signed)
Patient aware and understands

## 2017-12-18 ENCOUNTER — Telehealth (RURAL_HEALTH_CENTER): Payer: Self-pay

## 2017-12-18 NOTE — Telephone Encounter (Signed)
Aware and understand

## 2017-12-18 NOTE — Telephone Encounter (Signed)
11/4 lmtcb

## 2017-12-18 NOTE — Telephone Encounter (Signed)
Please call the patient regarding blood sugar readings received with the following message: Blood sugar readings from 11/17/17 to 12/08/17 received and reviewed.  Overall blood sugars look good. Continue the current regimen with no change and  bring glucose readings to next visit with Julieo n 01/15/18.

## 2018-01-04 ENCOUNTER — Telehealth (RURAL_HEALTH_CENTER): Payer: Self-pay

## 2018-01-04 NOTE — Telephone Encounter (Signed)
Please call the patient regarding blood sugar readings received with the following message: Blood sugar readings from 12/10/17 to 12/31/17 received and reviewed.  Overall blood sugars look good. Continue the current regimen with no change and  bring glucose readings to next visit on 01/15/18 with Almyra Free.

## 2018-01-04 NOTE — Telephone Encounter (Signed)
Patient informed and voiced understanding

## 2018-01-04 NOTE — Telephone Encounter (Signed)
Left message for return call.

## 2018-01-12 ENCOUNTER — Ambulatory Visit
Admission: RE | Admit: 2018-01-12 | Discharge: 2018-01-12 | Disposition: A | Discharge status: Home / Self Care | Payer: BC Managed Care – PPO | Source: Ambulatory Visit | Attending: Family | Admitting: Family

## 2018-01-12 DIAGNOSIS — E1165 Type 2 diabetes mellitus with hyperglycemia: Secondary | ICD-10-CM | POA: Insufficient documentation

## 2018-01-12 DIAGNOSIS — Z794 Long term (current) use of insulin: Secondary | ICD-10-CM | POA: Insufficient documentation

## 2018-01-12 LAB — BASIC METABOLIC PANEL
Anion Gap: 14.1 mMol/L (ref 7.0–18.0)
BUN / Creatinine Ratio: 21.5 Ratio (ref 10.0–30.0)
BUN: 32 mg/dL — ABNORMAL HIGH (ref 7–22)
CO2: 24.2 mMol/L (ref 20.0–30.0)
Calcium: 9.5 mg/dL (ref 8.5–10.5)
Chloride: 107 mMol/L (ref 98–110)
Creatinine: 1.49 mg/dL — ABNORMAL HIGH (ref 0.80–1.30)
EGFR: 52 mL/min/{1.73_m2} — ABNORMAL LOW (ref 60–150)
Glucose: 153 mg/dL — ABNORMAL HIGH (ref 71–99)
Osmolality Calculated: 290 mOsm/kg (ref 275–300)
Potassium: 4.4 mMol/L (ref 3.5–5.3)
Sodium: 141 mMol/L (ref 136–147)

## 2018-01-12 LAB — ALT: ALT: 27 U/L (ref 0–55)

## 2018-01-12 LAB — LIPID PANEL
Cholesterol: 169 mg/dL (ref 75–199)
Coronary Heart Disease Risk: 4.83
HDL: 35 mg/dL — ABNORMAL LOW (ref 40–55)
LDL Calculated: 61 mg/dL
Triglycerides: 363 mg/dL — ABNORMAL HIGH (ref 10–150)
VLDL: 73 — ABNORMAL HIGH (ref 0–40)

## 2018-01-13 LAB — HEMOGLOBIN A1C: Hgb A1C, %: 6.5 %

## 2018-01-15 ENCOUNTER — Encounter (RURAL_HEALTH_CENTER): Payer: Self-pay | Admitting: Family

## 2018-01-15 ENCOUNTER — Ambulatory Visit: Payer: BC Managed Care – PPO | Attending: Family | Admitting: Family

## 2018-01-15 VITALS — BP 110/67 | HR 96 | Wt 251.7 lb

## 2018-01-15 DIAGNOSIS — E11649 Type 2 diabetes mellitus with hypoglycemia without coma: Secondary | ICD-10-CM

## 2018-01-15 DIAGNOSIS — Z794 Long term (current) use of insulin: Secondary | ICD-10-CM

## 2018-01-15 DIAGNOSIS — E782 Mixed hyperlipidemia: Secondary | ICD-10-CM

## 2018-01-15 DIAGNOSIS — I10 Essential (primary) hypertension: Secondary | ICD-10-CM

## 2018-01-15 NOTE — Patient Instructions (Addendum)
Below is a summary of information and instructions we reviewed at today's appointment. Please review this information carefully and call if you have any questions regarding it.     Diabetes information/instructions:   Labwork: You are due for lab work  on or after 04/06/18.   You should fast for 10 hours prior to having this labwork done.  Please take your meter to the lab and check your fingerstick blood sugar while there. Please call or send [place] a message when you get home with the reading you got so we can compare to what the lab got and verify your meter is in range. Do not ask lab staff to check your meter, they do not know how!   Self-monitoring of blood glucose: We recommend that you monitor your blood glucose before breakfast, lunch, dinner and bedtime, and as needed for signs and/or symptoms of hypoglycemia. Please fax, email, drop off or MyChartMessage your blood sugar readings for further adjustment of regimen  in one week  Insulin:  use new lunchtime Novolog correction scale (reduced dose)  Hypoglycemia prevention and treatment:  Please carry fast-acting carbohydrate source (preferably glucose tablets) with you at all times. Please treat low blood sugar with 15 grams carbohydrate (4 glucose tablets), wait 15 minutes, and recheck sugar. Re-treat if necessary.   Please notify office for any episode of severe hypoglycemia, such as those requiring assistance from someone else.   Diet: Please watch your portions.  Please aim for a consistent carbohydrate intake at each meal. Please try to avoid missing meals.  Exercise: We have recommended that you begin an exercise regimen.   Foot care:  Please examine feet daily and appropriately treat callouses or lesions. Please avoid being barefoot or wearing of ill fitting shoes. Please use lotion as needed to treat dry skin on feet. Please follow up with your podiatrist regarding routine foot care and nail trimming  Diabetic screening is up-to-date except for an eye  exam and a dental exam.  It is important to have your eyes examined every year and to have regular dental care.     The following additional instructions relate to health conditions other than your diabetes:   Vitamin D: Your vitamin D level is in the ideal range. Please continue the current dose of vitamin D.  Tobacco use:  Congratulations on quitting! please continue not to smoke!

## 2018-01-15 NOTE — Progress Notes (Signed)
Endocrine Provider Progress Note      Patient Name:  [name] [[id number]] DOB: May 09, 1965  Date: 01/15/2018    Subjective:          [name] is a 52 y.o. male who presents for follow up of type 2 diabetes mellitus.   Since the last visit,  the patient has been well with no interval health problems.   He is not smoking.     Monitoring, Compliance and Complications:  Diagnosis Date: ~2000-2001  Prior Diabetic Treatment: Wilder Glade (discontinued due to renal function)  Hgb A1c at time of referral: 11.1% (done 12/20/16). A1c drawn today prior to appointment  HGB A1C goal based on age and comorbidities is 7%.  The patient has the following diabetic complications:  retinopathy  He has had multiple surgeries for osteomyelitis on his left foot in 2017.     Glucose Meter: Reli-On, not sure which model  Glucose Meter Validation Date: never  The patient did not bring the glucose meter or glucose readings today.  Compliance with blood glucose monitoring: fair   The patient is not currently testing home blood glucose.   Glucose readings are as follows:  -      CGM Type:  Freestyle Libre  CGM use fequency:  all of the time   CGM Purchase Date: 2019  Average sensor glucose: 150  Percent of time in target range: 50  Percent of time above target range: 46  Percent of time below target range: 4 but target is 90-150  Tracings 11/19-12/2 show trend of higher readings overnight and target range during the day.    Injections:   Injections are given by patient.   Injections sites include: abdominal wall  Injection compliance: The patient never misses injections.  No problems noted with injection sites.    Takes Novolog 7 units for glucose 100-150, will self adjust dose up to 12-15 if glucose greater 150    Diet, Exercise, and Weight:     The patient follows no particular diet.  Compliance with diet has been variable.   Exercise: The patient exercises regularly, 6-7 times per week.   Weight: Weight has increased 4 lbs since last  visit.    Hypoglycemia:   The patient has experienced mild occasional hypoglycemia since the last visit. Symptoms of hypoglycemia include: "seeing spots" Pattern in evening before dinner esp on gym days The patient treats hypoglycemia with meal or glucose tablets.  The patient does not have a Glucagon Emergency Kit.  The patient does not have a medical alert bracelet or necklace.    Diabetic Education: The patient has never received diabetes education.  Insurance doesn't "cover much of it"        Diabetic Complication Screening/Treatment  Eye exam: Dr. [facility]". Last visit was sometime in 2018.  No evidence of diabetic retinopathy on last eye exam.  Urine microalbumin/creatinine ratio: N/A. The patient has CKD followed by nephrologist.  Dr. Elby Beck  Foot Care: The patient reports no new foot problems. The patient checks the feet regularly.  The patient cuts toenails without difficulty.   Monofilament: 05/11/17, partially intact Overdue for follow up with podiatrist  Aspirin Therapy: The patient is currently taking aspirin at dose indicated in the medication list.   Cardiovascular risk factors: diabetes mellitus, dyslipidemia, hypertension, male gender, obesity (BMI >= 30 kg/m2), sedentary lifestyle and smoking/ tobacco exposure  Ace Inhibitor/ARB: The patient is taking ACE-I/ARB at dose indicated in medication list and is tolerating this well.  Dental care: Patient has not received dental care in past year due to "bad experience as a child, have phobia"     Current Outpatient Medications   Medication Sig Dispense Refill   . aspirin EC 81 MG EC tablet Take 1 tablet (81 mg total) by mouth every evening. 90 tablet 3   . atorvastatin (LIPITOR) 40 MG tablet TAKE 1 TABLET BY MOUTH ONCE DAILY 30 tablet 11   . Blood Glucose Monitoring Suppl (RELION PRIME MONITOR) Device by Does not apply route USE TO TEST BLOOD GLUCOSE FOUR TIMES DAILY     . Cholecalciferol (VITAMIN D) 2000 units tablet Take 2,000 Units by mouth  daily     . Continuous Blood Gluc Receiver (FREESTYLE LIBRE 14 DAY READER) Device 1 each by Does not apply route 4 (four) times daily 1 each 3   . Continuous Blood Gluc Sensor (FREESTYLE LIBRE 14 DAY SENSOR) Misc 1 each by Does not apply route every 14 (fourteen) days 6 each 3   . gabapentin (NEURONTIN) 300 MG capsule Take 1 capsule (300 mg total) by mouth 3 (three) times daily 270 capsule 1   . glucose blood (RELION PRIME TEST) test strip 1 each by Other route 4 (four) times daily Use as instructed     . insulin aspart (NOVOLOG FLEXPEN) 100 UNIT/ML injection pen INJECT PRIOR TO MEALS PER CORRECTION SCALE UP TO 54 UNITS DAILY 60 mL 1   . insulin glargine (LANTUS SOLOSTAR) 100 UNIT/ML injection pen Inject 65 Units into the skin daily 60 mL 1   . Insulin Pen Needle 31G X 6 MM Misc by Does not apply route.Use up to 7 times a day as directed     . lisinopril (PRINIVIL,ZESTRIL) 20 MG tablet TAKE 2 TABLETS BY MOUTH NIGHTLY 180 tablet 3   . VICTOZA 18 MG/3ML injection INJECT 1.8MG   SUBCUTANEOUSLY ONCE DAILY 18 mL 1     No current facility-administered medications for this visit.       Medication review with [name] suggested compliance all of the time.       Immunization History   Administered Date(s) Administered   . Td 06/14/2009     The following portions of the patient's history were reviewed and updated as appropriate: allergies, current medications, past family history, past medical history, past social history, past surgical history and problem list.  The following information was also reviewed at today's visit: office notes from referring provider and lab data    Review of Systems  As above.    Negative for CP   Positive for "drainage from sinuses that doesn't taste right" history of sinus issues    Objective:      Vital Signs: BP 110/67 (BP Site: Right arm, Patient Position: Sitting, Cuff Size: Large)   Pulse 96   Wt 114.2 kg (251 lb 11.2 oz)   BMI 37.58 kg/m   PE  Well nourished, well appearing, in  no acute distress.  HEENT:  There is no stare or lid lag. There is no periorbital edema. Extraoccular movements are intact. No conjunctival injection.   Neck is supple without adenopathy.  The thyroid is normal in size and firm in consistency. No nodules are appreciated.   Lungs are clear to auscultation.  Cardiac exam reveals a regular rate and rhythm. No murmur or gallop is appreciated.   Exam of the extremities reveals nonpitting edema noted at sock line     There is deformity of the on feet foot, including  hammertoe 2nd toes.  Callouses are noted on feet. Toenail hygeine is  poor Onychomycosis is noted in multiple nails on both feet. Dry skin is noted on both feet.  On neurologic exam, the patient is alert and appropriate. Gait and speech are normal. Reflexes not tested.  Skin is cool and dry.      Lab Review    Lab Results   Component Value Date    HGBA1CPERCNT 6.5 01/12/2018    HGBA1CPERCNT 7.1 09/13/2017    HGBA1CPERCNT 12.3 05/11/2017    TSH 1.59 09/13/2017    CHOL 169 01/12/2018    HDL 35 (L) 01/12/2018    LDL 61 01/12/2018    TRIG 363 (H) 01/12/2018    ALT 27 01/12/2018    B12 419 09/13/2017    VITD 21 (L) 09/13/2017    GLU 153 (H) 01/12/2018    K 4.4 01/12/2018    CA 9.5 01/12/2018    CREAT 1.49 (H) 01/12/2018    CO2 24.2 01/12/2018    EGFR 52 (L) 01/12/2018    NA 141 01/12/2018    ALKPHOS 111 09/13/2017      09/13/17 vitamin D2/D3 total 20 (normal 20-50)    Assessment:      1. Type 2 diabetes mellitus with hypoglycemia without coma, with long-term current use of insulin  Hemoglobin A1C    Lipid panel    ALT    Basic Metabolic Panel   2. Mixed hyperlipidemia     3. Essential hypertension       Diabetes: Diabetes is improved. Insulin regimen adjusted. Rationale for improved control discussed with the patient today. Discussed specific areas of diabetes management as detailed under patient instructions below. Form for gym signed.  Lipids: We have reviewed that the triglycerides are about goal. We have  discussed that they are likely to improve with decreased intake of simple sugars and improved diabetic control.    BP: Blood pressure is well controlled on the current regimen which is well tolerated and will be continued.    Vitamin D: The vitamin D level is in optimal range on the current dose of vitamin D repletion and this will be continued. We will continue to monitor the vitamin D periodically and adjust the dose as needed.      Plan:   Below is a summary of information and instructions we reviewed at today's appointment. Please review this information carefully and call if you have any questions regarding it.     Diabetes information/instructions:   Labwork: You are due for lab work  on or after 04/06/18.   You should fast for 10 hours prior to having this labwork done.  Please take your meter to the lab and check your fingerstick blood sugar while there. Please call or send [place] a message when you get home with the reading you got so we can compare to what the lab got and verify your meter is in range. Do not ask lab staff to check your meter, they do not know how!   Self-monitoring of blood glucose: We recommend that you monitor your blood glucose before breakfast, lunch, dinner and bedtime, and as needed for signs and/or symptoms of hypoglycemia. Please fax, email, drop off or MyChartMessage your blood sugar readings for further adjustment of regimen  in one week  Insulin:  use new lunchtime Novolog correction scale (reduced dose)  Hypoglycemia prevention and treatment:  Please carry fast-acting carbohydrate source (preferably glucose tablets) with you at all times. Please treat  low blood sugar with 15 grams carbohydrate (4 glucose tablets), wait 15 minutes, and recheck sugar. Re-treat if necessary.   Please notify office for any episode of severe hypoglycemia, such as those requiring assistance from someone else.   Diet: Please watch your portions.  Please aim for a consistent carbohydrate intake at each  meal. Please try to avoid missing meals.  Exercise: We have recommended that you begin an exercise regimen.   Foot care:  Please examine feet daily and appropriately treat callouses or lesions. Please avoid being barefoot or wearing of ill fitting shoes. Please use lotion as needed to treat dry skin on feet.  Please follow up with your podiatrist regarding routine foot care and nail trimming  Diabetic screening is up-to-date except for an eye exam and a dental exam.  It is important to have your eyes examined every year and to have regular dental care.     The following additional instructions relate to health conditions other than your diabetes:   Vitamin D: Your vitamin D level is in the ideal range. Please continue the current dose of vitamin D.  Tobacco use:  Congratulations on quitting! please continue not to smoke!      Return in about 3 months (around 04/16/2018).    Annamaria Boots, NP

## 2018-01-25 ENCOUNTER — Telehealth (RURAL_HEALTH_CENTER): Payer: Self-pay

## 2018-01-25 NOTE — Telephone Encounter (Signed)
Please call the patient regarding blood sugar readings from 01/01/18 to 01/22/18 received and reviewed.  Blood sugars good overall but low before supper at times and often above target fasting.. Please decrease lunch insulin scale by 1 unit. Please increase Lantus insulin dose 1 unit and  send glucose readings again in 2 weeks.

## 2018-01-25 NOTE — Telephone Encounter (Signed)
12/12 lmtcb

## 2018-01-26 NOTE — Telephone Encounter (Signed)
12/13 lmtcb

## 2018-01-29 ENCOUNTER — Encounter (RURAL_HEALTH_CENTER): Payer: Self-pay

## 2018-01-29 NOTE — Telephone Encounter (Signed)
12/16 lmtcb letter sent

## 2018-02-19 ENCOUNTER — Telehealth (RURAL_HEALTH_CENTER): Payer: Self-pay

## 2018-02-19 NOTE — Telephone Encounter (Signed)
1/6 lmtcb

## 2018-02-19 NOTE — Telephone Encounter (Signed)
Please call the patient regarding blood sugar readings received with the following message: Blood sugar readings from 01/23/18 to 02/13/18 received and reviewed.  Overall blood sugars look good and fasting readings slightly improved with last Lantus increase. Continue the current regimen with no change and with sending glucose readings periodically.

## 2018-02-20 NOTE — Telephone Encounter (Signed)
1/7 lmtcb

## 2018-02-21 ENCOUNTER — Encounter (RURAL_HEALTH_CENTER): Payer: Self-pay

## 2018-02-21 NOTE — Telephone Encounter (Signed)
1/8 lmtcb letter sent

## 2018-03-12 ENCOUNTER — Telehealth (RURAL_HEALTH_CENTER): Payer: Self-pay

## 2018-03-12 NOTE — Telephone Encounter (Signed)
Please call the patient regarding blood sugar readings from 02/15/18 to 03/08/18 received and reviewed.  Overall blood sugars look really good! Continue the current regimen with no change and  sending glucose readings periodically.

## 2018-03-12 NOTE — Telephone Encounter (Signed)
Aware and understands

## 2018-03-14 ENCOUNTER — Other Ambulatory Visit (RURAL_HEALTH_CENTER): Payer: Self-pay | Admitting: Family

## 2018-03-14 ENCOUNTER — Telehealth (RURAL_HEALTH_CENTER): Payer: Self-pay | Admitting: Family

## 2018-03-14 ENCOUNTER — Encounter (RURAL_HEALTH_CENTER): Payer: Self-pay | Admitting: Medical

## 2018-03-14 ENCOUNTER — Ambulatory Visit: Payer: BC Managed Care – PPO | Attending: Medical | Admitting: Medical

## 2018-03-14 VITALS — BP 130/72 | HR 106 | Temp 97.0°F | Resp 16 | Wt 245.0 lb

## 2018-03-14 DIAGNOSIS — J0100 Acute maxillary sinusitis, unspecified: Secondary | ICD-10-CM | POA: Insufficient documentation

## 2018-03-14 DIAGNOSIS — I10 Essential (primary) hypertension: Secondary | ICD-10-CM

## 2018-03-14 DIAGNOSIS — E1165 Type 2 diabetes mellitus with hyperglycemia: Secondary | ICD-10-CM

## 2018-03-14 DIAGNOSIS — Z794 Long term (current) use of insulin: Secondary | ICD-10-CM

## 2018-03-14 MED ORDER — AMOXICILLIN 500 MG PO CAPS
500.00 mg | ORAL_CAPSULE | Freq: Three times a day (TID) | ORAL | 0 refills | Status: AC
Start: 2018-03-14 — End: 2018-03-24

## 2018-03-14 NOTE — Telephone Encounter (Signed)
Please call patient: Wanted to follow up. Please confirm still following with renal. Any recent labs? If so please request copy. Noted he has been sick and was taking NSAIDs. Recommend avoid NSAIDs given renal function. Stay hydrated. If no recent labs or plan for labs, can get nonfasting chemistry at soonest convenience. Order in chart.

## 2018-03-14 NOTE — Progress Notes (Signed)
[place]   MULTI-SPECIALTY   Office Visit Note    Demographics:     Date Time: 03/14/2018 5:07 PM  Patient Name: [name], [name]  Date of Birth: 01/09/1966  Age: 53 y.o.   Primary Care Provider: Asistores, Ellwood Dense, MD    Chief Complaint:     Chief Complaint   Patient presents with    URI     cough, sore throat; fever at night; chest congestion; 1 week       History of Presenting Illness:     [name] is a 53 y.o. male that presents today with a history of The encounter diagnosis was Acute non-recurrent maxillary sinusitis.    He reports increased cough and bad smell and taste in his mouth. He reports increased pressure and pain along his bilateral maxillary sinus. He has been using allergy medication over the counter along with NSAIDs. He does admits to symptoms resolving about 1.5 weeks ago and then they returned.     He states cough is getting worse, and states at night time it is even worse. He denies tooth pain.     He reports tylenol as well.       The patient reports a pain level of  Pain Score: 8-severe pain out of 10.    Medical History     Past Medical History:   Diagnosis Date    Complication of anesthesia     last time he was kicking his legs had to wake him up    Diabetes mellitus     Diabetic ulcer of left foot 12/26/14    Disc     Gastroesophageal reflux disease     Hyperlipidemia     Hypertension     Seasonal allergic rhinitis     Sleep apnea     not tested    Type 2 diabetes mellitus, controlled       Past Surgical History:   Procedure Laterality Date    ARTHRODESIS, TOE Left 05/04/2015    Procedure: ARTHRODESIS, TOE;  Surgeon: Janora Norlander, DPM;  Location: [facility];  Service: Podiatry;  Laterality: Left;  1st MPJ FUSION    DEBRIDEMENT & IRRIGATION, LOWER EXTREMITY Left 03/12/2015    Procedure: [place], LOWER EXTREMITY;  Surgeon: Janora Norlander, DPM;  [facility];  Service: Podiatry;   Laterality: Left;  I&D LEFT FOOT FOR BONE INFECTION    DEBRIDEMENT & IRRIGATION, LOWER EXTREMITY Left 03/07/2016    Procedure: DEBRIDEMENT & IRRIGATION, LEFT FOOT 2ND TOE, EXCISION OF 2ND METATARSEL;  Surgeon: Janora Norlander, DPM;  [facility];  Service: Podiatry;  Laterality: Left;  I&D Left Foot    HAND SURGERY        (Not in a hospital admission)    Allergies   Allergen Reactions    Flu Virus Vaccine Anaphylaxis      Social History     Tobacco Use    Smoking status: Former Smoker     Packs/day: 0.25     Types: Cigarettes     Last attempt to quit: 09/06/2017     Years since quitting: 0.5    Smokeless tobacco: Never Used    Tobacco comment: smokes at work, not at home   Substance Use Topics    Alcohol use: No     Alcohol/week: 0.0 standard drinks     Comment: in past      Family History   Problem Relation  Age of Onset    Diabetes Mother     Lung cancer Mother     Cancer Mother         lung    Depression Sister     COPD Sister     Cancer Sister         breast    Hypertension Brother         Review of Systems:      Review of Systems   Constitutional: Positive for fever and malaise/fatigue. Negative for chills.   HENT: Positive for congestion, ear pain, sinus pain and sore throat. Negative for ear discharge.    Eyes: Negative for photophobia, pain and discharge.   Respiratory: Negative for cough and wheezing.    Cardiovascular: Negative for chest pain and palpitations.   Gastrointestinal: Negative for abdominal pain, constipation, diarrhea, nausea and vomiting.   Genitourinary: Negative for dysuria, frequency, hematuria and urgency.   Musculoskeletal: Negative for back pain, joint pain, myalgias and neck pain.   Skin: Negative for itching and rash.   Neurological: Positive for headaches. Negative for dizziness.        Objective:     Vitals:    03/14/18 1652   BP: 130/72   Pulse: (!) 106   Resp: 16   Temp: 97 F (36.1 C)   SpO2: 96%     Body mass index is 36.58 kg/m.  Physical Exam   Constitutional:       Appearance: Normal appearance. He is normal weight. He is ill-appearing. He is not toxic-appearing or diaphoretic.   HENT:      Head: Normocephalic and atraumatic.      Right Ear: Ear canal and external ear normal. No middle ear effusion. There is no impacted cerumen. Tympanic membrane is injected. Tympanic membrane is not erythematous, retracted or bulging.      Left Ear: Ear canal and external ear normal.  No middle ear effusion. There is no impacted cerumen. Tympanic membrane is injected. Tympanic membrane is not erythematous, retracted or bulging.      Ears:        Nose: Mucosal edema, congestion and rhinorrhea present.      Right Nostril: No septal hematoma.      Left Nostril: No septal hematoma.      Right Turbinates: Enlarged and swollen.      Left Turbinates: Enlarged and swollen.      Right Sinus: Maxillary sinus tenderness present.      Left Sinus: Maxillary sinus tenderness present.        Mouth/Throat:      Mouth: Mucous membranes are dry.      Pharynx: Oropharynx is clear. No oropharyngeal exudate or posterior oropharyngeal erythema.   Eyes:      Extraocular Movements: Extraocular movements intact.      Conjunctiva/sclera: Conjunctivae normal.      Pupils: Pupils are equal, round, and reactive to light.   Cardiovascular:      Rate and Rhythm: Normal rate and regular rhythm.      Heart sounds: Normal heart sounds.   Pulmonary:      Effort: Pulmonary effort is normal. No respiratory distress.      Breath sounds: Normal breath sounds. No stridor. No decreased breath sounds, wheezing, rhonchi or rales.   Chest:      Chest wall: No tenderness.   Lymphadenopathy:      Head:      Right side of head: No submandibular, tonsillar, preauricular or posterior auricular adenopathy.  Left side of head: No submandibular, tonsillar, preauricular or posterior auricular adenopathy.      Cervical: No cervical adenopathy.      Right cervical: No superficial cervical adenopathy.     Left cervical:  No superficial cervical adenopathy.   Skin:     General: Skin is warm.   Neurological:      General: No focal deficit present.      Mental Status: He is alert and oriented to person, place, and time. Mental status is at baseline.   Psychiatric:         Mood and Affect: Mood normal.         Behavior: Behavior normal.         Thought Content: Thought content normal.         Judgment: Judgment normal.         Hearing/Vision Exam:     No exam data present       Image Review:     .No results found.    Lab Review:     Results     None          Assessment:     1. Acute non-recurrent maxillary sinusitis        Plan:     The patient was counseled on possible medication/treatment side effects. The patient was encouraged to take any and all medications as prescribed, drink plenty of fluids and to get plenty of rest. The patient was instructed to follow up with their primary care provider. The patient was also instructed to report directly to the emergency department if they experience worsening symptoms and to follow up in 2-3 days if no improvement in symptoms. The patient was instructed to keep all current healthcare appointments. At the conclusion of the visit we reviewed diagnosis, treatment plan, diagnostic tests, prescriptions and follow up instructions pertaining to this visit. All patient questions and concerns regarding the present current condition were addressed. Results of any diagnostic test ordered today are listed above under lab review or image review. Patient voiced understanding education on worsening of signs and symptoms and when to seek ER attention.    Orders as below:    1. Acute non-recurrent maxillary sinusitis        Orders/Procedures:       No orders of the defined types were placed in this encounter.     - MAR ACTION REPORT  (last 24 hrs)         ** No medications to display **        New Prescriptions    AMOXICILLIN (AMOXIL) 500 MG CAPSULE    Take 1 capsule (500 mg total) by mouth 3 (three) times  daily for 10 days     Modified Medications    No medications on file       Signed electronically by  Jamse Belfast, PA 03/14/2018, 5:07 PM     *This note was generated by the Epic EMR system/ Dragon speech recognition and may contain inherent errors or omissions not intended by the user. Grammatical errors, random word insertions, deletions, pronoun errors and incomplete sentences are occasional consequences of this technology due to software limitations. Not all errors are caught or corrected. If there are questions or concerns about the content of this note or information contained within the body of this dictation they should be addressed directly with the author for clarification.

## 2018-03-20 ENCOUNTER — Other Ambulatory Visit (RURAL_HEALTH_CENTER): Payer: Self-pay | Admitting: Family

## 2018-03-20 DIAGNOSIS — E1165 Type 2 diabetes mellitus with hyperglycemia: Secondary | ICD-10-CM

## 2018-03-20 DIAGNOSIS — Z794 Long term (current) use of insulin: Secondary | ICD-10-CM

## 2018-04-05 ENCOUNTER — Telehealth (RURAL_HEALTH_CENTER): Payer: Self-pay

## 2018-04-05 NOTE — Telephone Encounter (Signed)
2/20 lmtcb

## 2018-04-05 NOTE — Telephone Encounter (Signed)
Please call the patient regarding blood sugar readings received with the following message: Blood sugar readings from 03/09/18 to 03/30/18 received and reviewed.  Blood sugars are fluctuating with no clear pattern but overall stable. No change in regimen now, but  bring glucose readings to next visit on 04/18/18.

## 2018-04-06 NOTE — Telephone Encounter (Signed)
2/21 lmtcb

## 2018-04-09 ENCOUNTER — Encounter (RURAL_HEALTH_CENTER): Payer: Self-pay

## 2018-04-09 ENCOUNTER — Telehealth (RURAL_HEALTH_CENTER): Payer: Self-pay

## 2018-04-09 NOTE — Telephone Encounter (Signed)
2/24 lmtcb and letter sent

## 2018-04-09 NOTE — Telephone Encounter (Signed)
Gave pt information from arlene

## 2018-04-11 ENCOUNTER — Ambulatory Visit
Admission: RE | Admit: 2018-04-11 | Discharge: 2018-04-11 | Disposition: A | Discharge status: Home / Self Care | Payer: BC Managed Care – PPO | Source: Ambulatory Visit | Attending: Family | Admitting: Family

## 2018-04-11 DIAGNOSIS — Z794 Long term (current) use of insulin: Secondary | ICD-10-CM | POA: Insufficient documentation

## 2018-04-11 DIAGNOSIS — E11649 Type 2 diabetes mellitus with hypoglycemia without coma: Secondary | ICD-10-CM | POA: Insufficient documentation

## 2018-04-11 LAB — LIPID PANEL
Cholesterol: 163 mg/dL (ref 75–199)
Coronary Heart Disease Risk: 5.26
HDL: 31 mg/dL — ABNORMAL LOW (ref 40–55)
LDL Calculated: 94 mg/dL
Triglycerides: 190 mg/dL — ABNORMAL HIGH (ref 10–150)
VLDL: 38 (ref 0–40)

## 2018-04-11 LAB — BASIC METABOLIC PANEL
Anion Gap: 11 mMol/L (ref 7.0–18.0)
BUN / Creatinine Ratio: 19.2 Ratio (ref 10.0–30.0)
BUN: 25 mg/dL — ABNORMAL HIGH (ref 7–22)
CO2: 26.8 mMol/L (ref 20.0–30.0)
Calcium: 8.7 mg/dL (ref 8.5–10.5)
Chloride: 108 mMol/L (ref 98–110)
Creatinine: 1.3 mg/dL (ref 0.80–1.30)
EGFR: 62 mL/min/{1.73_m2} (ref 60–150)
Glucose: 159 mg/dL — ABNORMAL HIGH (ref 71–99)
Osmolality Calculated: 289 mOsm/kg (ref 275–300)
Potassium: 4.8 mMol/L (ref 3.5–5.3)
Sodium: 141 mMol/L (ref 136–147)

## 2018-04-11 LAB — HEMOGLOBIN A1C: Hgb A1C, %: 6.7 %

## 2018-04-11 LAB — ALT: ALT: 22 U/L (ref 0–55)

## 2018-04-18 ENCOUNTER — Encounter (RURAL_HEALTH_CENTER): Payer: Self-pay | Admitting: Family

## 2018-04-18 ENCOUNTER — Ambulatory Visit: Payer: BC Managed Care – PPO | Attending: Family | Admitting: Family

## 2018-04-18 VITALS — BP 132/84 | HR 110 | Wt 251.8 lb

## 2018-04-18 DIAGNOSIS — E782 Mixed hyperlipidemia: Secondary | ICD-10-CM

## 2018-04-18 DIAGNOSIS — I10 Essential (primary) hypertension: Secondary | ICD-10-CM

## 2018-04-18 DIAGNOSIS — E1165 Type 2 diabetes mellitus with hyperglycemia: Secondary | ICD-10-CM

## 2018-04-18 DIAGNOSIS — Z794 Long term (current) use of insulin: Secondary | ICD-10-CM

## 2018-04-18 MED ORDER — INSULIN ASPART 100 UNIT/ML SC SOPN
PEN_INJECTOR | SUBCUTANEOUS | 5 refills | Status: DC
Start: 2018-04-18 — End: 2018-06-07

## 2018-04-18 MED ORDER — LANTUS SOLOSTAR 100 UNIT/ML SC SOPN
60.00 [IU] | PEN_INJECTOR | Freq: Every day | SUBCUTANEOUS | 3 refills | Status: DC
Start: 2018-04-18 — End: 2018-07-23

## 2018-04-18 MED ORDER — GLUCAGON 3 MG/DOSE NA POWD
1.00 | NASAL | 1 refills | Status: DC | PRN
Start: 2018-04-18 — End: 2019-10-23

## 2018-04-18 NOTE — Patient Instructions (Addendum)
Below is a summary of information and instructions we reviewed at today's appointment. Please review this information carefully and call if you have any questions regarding it.     Diabetes information/instructions:   Labwork: You are due for lab work  on or after 07/11/18.   You should fast for 10 hours prior to having this labwork done.  Please take your meter to the lab and check your fingerstick blood sugar while there. Please call or send [place] a message when you get home with the reading you got so we can compare to what the lab got and verify your meter is in range. Do not ask lab staff to check your meter, they do not know how!   Self-monitoring of blood glucose: We recommend that you monitor your blood glucose before breakfast, lunch, dinner and bedtime, and as needed for signs and/or symptoms of hypoglycemia. Please fax, email, drop off or MyChartMessage your blood sugar readings for further adjustment of regimen  in one week  Insulin:  Reduce Lantus to 60 units. Use new correction scale for  Novolog  Hypoglycemia prevention and treatment:  Please carry fast-acting carbohydrate source (preferably glucose tablets) with you at all times. Please treat low blood sugar with 15 grams carbohydrate (4 glucose tablets), wait 15 minutes, and recheck sugar. Re-treat if necessary.   Please notify office for any episode of severe hypoglycemia, such as those requiring assistance from someone else.   Diet: Please watch your portions.  Please aim for a consistent carbohydrate intake at each meal. Please try to avoid missing meals.  Exercise: We have recommended that you begin an exercise regimen.   Foot care:  Please examine feet daily and appropriately treat callouses or lesions. Please avoid being barefoot or wearing of ill fitting shoes. Please use lotion as needed to treat dry skin on feet.  Please follow up with your podiatrist regarding routine foot care and nail trimming  Diabetic screening is up-to-date except for an  eye exam and a dental exam.  It is important to have your eyes examined every year and to have regular dental care.   Follow up with primary care if sinus symptoms persist/worsen, fever    The following additional instructions relate to health conditions other than your diabetes:   Tobacco use:  Congratulations on quitting! please continue not to smoke!  Please follow up with primary care about sleep problem.

## 2018-04-18 NOTE — Progress Notes (Signed)
Endocrine Provider Progress Note      Patient Name:  [name] [[id number]] DOB: Jul 02, 1965  Date: 04/18/2018    Subjective:          [name] is a 54 y.o. male who presents for follow up of type 2 diabetes mellitus.   Since the last visit, the patient has had sinus and ear infection. He completed antibiotics about 3 weeks ago. He notes that ears feel fine. He still has blood tinged nasal discharge. No fever. No facial pain. He feels good overall except does not sleep well on some nights. He admits to daytime drowsiness. He is not smoking.     Monitoring, Compliance and Complications:  Diagnosis Date: ~2000-2001  Prior Diabetic Treatment: Wilder Glade (discontinued due to renal function)  Hgb A1c at time of referral: 11.1% (done 12/20/16). A1c drawn today prior to appointment  HGB A1C goal based on age and comorbidities is 7%.  The patient has the following diabetic complications:  retinopathy  He has had multiple surgeries for osteomyelitis on his left foot in 2017.     Glucose Meter: Reli-On, not sure which model  Glucose Meter Validation Date: never  The patient did not bring the glucose meter or glucose readings today.  Compliance with blood glucose monitoring: fair   The patient is not currently testing home blood glucose.   Glucose readings are as follows:  -      CGM Type:  Freestyle Libre  CGM use fequency:  all of the time   Calibration frequency: This CGM does not require callibration  Percent of time in target range: 49  For additional CGM data, see scanned CGM download attached to this visit note.   Average sensor glucose: 41  Percent of time above target range: 10  Percent of time below target range: . but target is 90-150  Tracings 2/20-3/4 show glucose is in target much of the time with higher fluctuations in afternoon.    Injections:   Injections are given by patient.   Injections sites include: abdominal wall  Injection compliance: The patient never misses injections.  No problems noted with  injection sites.    Takes Novolog 4-14 units breakfast, 4 units lunch, 0-8 units  Lantus 65 units daily    Diet, Exercise, and Weight:     The patient follows no particular diet.  Compliance with diet has been variable.   Exercise: The patient exercises regularly, 6-7 times per week.  more active at work  Weight: Weight has been stable since last visit.    Hypoglycemia:   The patient has experienced mild occasional hypoglycemia since the last visit. Symptoms of hypoglycemia include: shaking, sees spots, nausea.  Glucose has been as low as 50s. The patient treats hypoglycemia with glucose tablets or carb snack  Pattern: none    The patient does not have a Glucagon Emergency Kit.  The patient does not have a medical alert bracelet or necklace.    Diabetic Education: The patient has never received diabetes education.  Insurance doesn't "cover much of it"        Diabetic Complication Screening/Treatment  Eye exam: Dr. Andres Ege [facility]". Last visit was sometime in 2018.  No evidence of diabetic retinopathy on last eye exam.  Urine microalbumin/creatinine ratio: N/A. The patient has CKD followed by nephrologist.  Dr. Elby Beck  Foot Care: The patient reports no new foot problems. The patient checks the feet regularly.  The patient cuts toenails without difficulty.   Monofilament: 05/11/17,  partially intact Overdue for follow up with podiatrist  Aspirin Therapy: The patient is currently taking aspirin at dose indicated in the medication list.   Cardiovascular risk factors: diabetes mellitus, dyslipidemia, hypertension, male gender, obesity (BMI >= 30 kg/m2), sedentary lifestyle and smoking/ tobacco exposure  Ace Inhibitor/ARB: The patient is taking ACE-I/ARB at dose indicated in medication list and is tolerating this well.  Dental care: Patient has not received dental care in past year due to "bad experience as a child, have phobia"     Current Outpatient Medications   Medication Sig Dispense Refill    aspirin EC 81 MG EC  tablet Take 1 tablet (81 mg total) by mouth every evening. 90 tablet 3    atorvastatin (LIPITOR) 40 MG tablet TAKE 1 TABLET BY MOUTH ONCE DAILY 30 tablet 11    Blood Glucose Monitoring Suppl (RELION PRIME MONITOR) Device by Does not apply route USE TO TEST BLOOD GLUCOSE FOUR TIMES DAILY      Cholecalciferol (VITAMIN D) 2000 units tablet Take 2,000 Units by mouth daily      Continuous Blood Gluc Receiver (FREESTYLE LIBRE 14 DAY READER) Device 1 each by Does not apply route 4 (four) times daily 1 each 3    Continuous Blood Gluc Sensor (FREESTYLE LIBRE 14 DAY SENSOR) Misc 1 each by Does not apply route every 14 (fourteen) days 6 each 3    furosemide (LASIX) 20 MG tablet Take 20 mg by mouth daily      gabapentin (NEURONTIN) 300 MG capsule Take 1 capsule (300 mg total) by mouth 3 (three) times daily 270 capsule 1    glucose blood (RELION PRIME TEST) test strip 1 each by Other route 4 (four) times daily Use as instructed      insulin aspart (NOVOLOG FLEXPEN) 100 UNIT/ML injection pen INJECT PRIOR TO MEALS PER CORRECTION SCALE UP TO 54 UNITS DAILY 60 mL 1    insulin glargine (LANTUS SOLOSTAR) 100 UNIT/ML injection pen Inject 65 Units into the skin daily 60 mL 1    Insulin Pen Needle 31G X 6 MM Misc by Does not apply route.Use up to 7 times a day as directed      lisinopril (PRINIVIL,ZESTRIL) 20 MG tablet TAKE 2 TABLETS BY MOUTH NIGHTLY 180 tablet 3    VICTOZA 18 MG/3ML injection INJECT 1.8 MG SUBCUTANEOUSLY ONCE DAILY 18 mL 0     No current facility-administered medications for this visit.       Medication review with [name] suggested compliance all of the time.       Immunization History   Administered Date(s) Administered    Td 06/14/2009     The following portions of the patient's history were reviewed and updated as appropriate: allergies, current medications, past family history, past medical history, past social history, past surgical history and problem list.  The following information was  also reviewed at today's visit: office notes from referring provider and lab data    Review of Systems  As above.    Negative for CP    Objective:      Vital Signs: BP 132/84 (BP Site: Right arm, Patient Position: Sitting)    Pulse (!) 110    Wt 114.2 kg (251 lb 12.8 oz)    BMI 37.59 kg/m   PE  Well nourished, well appearing, in no acute distress.  HEENT:  There is no stare or lid lag. There is no periorbital edema. Extraoccular movements are intact. No conjunctival injection.  Neck is supple without adenopathy.  The thyroid is normal in size and firm in consistency. No nodules are appreciated.   Lungs are clear to auscultation.  Cardiac exam reveals a regular rate and rhythm. No murmur or gallop is appreciated.   Exam of the extremities reveals no pitting edema  There is deformity of the on feet foot, including hammertoe 2nd toes.  Callouses are noted on feet. Toenail hygeine is  poor Onychomycosis is noted in multiple nails on both feet. Dry skin is noted on both feet. Bruising and callousing on side of right great toe.  On neurologic exam, the patient is alert and appropriate. Gait and speech are normal. Reflexes not tested.  Skin is cool and dry.      Lab Review    Lab Results   Component Value Date    HGBA1CPERCNT 6.7 04/11/2018    HGBA1CPERCNT 6.5 01/12/2018    HGBA1CPERCNT 7.1 09/13/2017    TSH 1.59 09/13/2017    CHOL 163 04/11/2018    HDL 31 (L) 04/11/2018    LDL 94 04/11/2018    TRIG 190 (H) 04/11/2018    ALT 22 04/11/2018    B12 419 09/13/2017    VITD 21 (L) 09/13/2017    GLU 159 (H) 04/11/2018    K 4.8 04/11/2018    CA 8.7 04/11/2018    CREAT 1.30 04/11/2018    CO2 26.8 04/11/2018    EGFR 62 04/11/2018    NA 141 04/11/2018    ALKPHOS 111 09/13/2017      09/13/17 vitamin D2/D3 total 20 (normal 20-50)    Assessment:      1. Type 2 diabetes mellitus with hyperglycemia, with long-term current use of insulin  Hemoglobin A1C    Lipid panel    ALT    Basic Metabolic Panel    insulin glargine (LANTUS SOLOSTAR)  100 UNIT/ML injection pen    insulin aspart (NOVOLOG FLEXPEN) 100 UNIT/ML injection pen    Glucagon (BAQSIMI TWO PACK) 3 MG/DOSE Powder   2. Essential hypertension     3. Mixed hyperlipidemia       Diabetes: Diabetes is suboptimally controlled. Discussed specific areas of diabetes management as detailed under patient instructions below.   Lipids: We have reviewed that the triglycerides are above goal but improving. We have discussed that they are likely to improve with decreased intake of simple sugars and improved diabetic control.    BP: Blood pressure is well controlled on the current regimen which is well tolerated and will be continued.    Vitamin D: The vitamin D level normal on most recent lab testing Will check periodically.     Plan:   Below is a summary of information and instructions we reviewed at today's appointment. Please review this information carefully and call if you have any questions regarding it.     Diabetes information/instructions:   Labwork: You are due for lab work  on or after 07/11/18.   You should fast for 10 hours prior to having this labwork done.  Please take your meter to the lab and check your fingerstick blood sugar while there. Please call or send [place] a message when you get home with the reading you got so we can compare to what the lab got and verify your meter is in range. Do not ask lab staff to check your meter, they do not know how!   Self-monitoring of blood glucose: We recommend that you monitor your blood glucose before breakfast, lunch, dinner  and bedtime, and as needed for signs and/or symptoms of hypoglycemia. Please fax, email, drop off or MyChartMessage your blood sugar readings for further adjustment of regimen  in one week  Insulin:  Reduce Lantus to 60 units. Use new correction scale for  Novolog  Hypoglycemia prevention and treatment:  Please carry fast-acting carbohydrate source (preferably glucose tablets) with you at all times. Please treat low blood sugar with  15 grams carbohydrate (4 glucose tablets), wait 15 minutes, and recheck sugar. Re-treat if necessary.   Please notify office for any episode of severe hypoglycemia, such as those requiring assistance from someone else.   Diet: Please watch your portions.  Please aim for a consistent carbohydrate intake at each meal. Please try to avoid missing meals.  Exercise: We have recommended that you begin an exercise regimen.   Foot care:  Please examine feet daily and appropriately treat callouses or lesions. Please avoid being barefoot or wearing of ill fitting shoes. Please use lotion as needed to treat dry skin on feet.  Please follow up with your podiatrist regarding routine foot care and nail trimming  Diabetic screening is up-to-date except for an eye exam and a dental exam.  It is important to have your eyes examined every year and to have regular dental care.   Follow up with primary care if sinus symptoms persist/worsen, fever    The following additional instructions relate to health conditions other than your diabetes:   Tobacco use:  Congratulations on quitting! please continue not to smoke!  Please follow up with primary care about sleep problem.    Return in about 3 months (around 07/19/2018).    Annamaria Boots, NP

## 2018-04-22 NOTE — Progress Notes (Signed)
Note reviewed. Agree with assessment and plan as outlined by Ms. Costin, FNP-BC below.  [name] A. Stephen Stockburger, MD, FACP, FACE

## 2018-05-18 ENCOUNTER — Other Ambulatory Visit (RURAL_HEALTH_CENTER): Payer: Self-pay | Admitting: Family

## 2018-05-18 DIAGNOSIS — Z794 Long term (current) use of insulin: Secondary | ICD-10-CM

## 2018-05-18 DIAGNOSIS — E1165 Type 2 diabetes mellitus with hyperglycemia: Secondary | ICD-10-CM

## 2018-05-28 ENCOUNTER — Telehealth (RURAL_HEALTH_CENTER): Payer: Self-pay

## 2018-05-28 NOTE — Telephone Encounter (Signed)
I called the patient regarding blood sugar readings received.  Blood sugar readings from 04/22/18 to 05/13/18 received and reviewed. Fasting blood sugars above target, but good rest of day and A1c in range. Continue the current regimen with no change and  sending glucose readings periodically.

## 2018-05-31 ENCOUNTER — Other Ambulatory Visit (RURAL_HEALTH_CENTER): Payer: Self-pay | Admitting: Family Medicine

## 2018-05-31 DIAGNOSIS — Z794 Long term (current) use of insulin: Secondary | ICD-10-CM

## 2018-05-31 DIAGNOSIS — I10 Essential (primary) hypertension: Secondary | ICD-10-CM

## 2018-06-07 ENCOUNTER — Other Ambulatory Visit (RURAL_HEALTH_CENTER): Payer: Self-pay | Admitting: Family

## 2018-06-07 DIAGNOSIS — E1165 Type 2 diabetes mellitus with hyperglycemia: Secondary | ICD-10-CM

## 2018-06-12 ENCOUNTER — Telehealth (RURAL_HEALTH_CENTER): Payer: Self-pay

## 2018-06-12 NOTE — Telephone Encounter (Signed)
4/28 lmtcb

## 2018-06-12 NOTE — Telephone Encounter (Signed)
Please call the patient regarding blood sugar readings received with the following message: Blood sugar readings from 05/14/18 to 06/04/18 received and reviewed.  Blood sugars borderline low at times throughout the day.  Please decrease Lantus insulin dose 4 units and  send glucose readings again in 2 weeks.

## 2018-06-13 NOTE — Telephone Encounter (Signed)
4/29 lmtcb

## 2018-06-14 ENCOUNTER — Encounter (RURAL_HEALTH_CENTER): Payer: Self-pay

## 2018-06-14 NOTE — Telephone Encounter (Signed)
4/30 lmtcb and letter sent

## 2018-06-20 NOTE — Telephone Encounter (Signed)
Patient finally called back aware and understands

## 2018-06-29 ENCOUNTER — Telehealth (RURAL_HEALTH_CENTER): Payer: Self-pay

## 2018-06-29 NOTE — Telephone Encounter (Signed)
[name],     Patient insurance prefers him to have Humalog over the novolog, can this be changed

## 2018-07-03 ENCOUNTER — Telehealth (RURAL_HEALTH_CENTER): Payer: Self-pay

## 2018-07-03 NOTE — Telephone Encounter (Signed)
Please call the patient regarding blood sugar readings received with the following message: Blood sugar readings from 06/05/18 to 06/26/18 received and reviewed.  Overall blood sugars look good. Continue the current regimen with no change and  bring glucose readings to next visit.

## 2018-07-03 NOTE — Telephone Encounter (Signed)
Patient made aware.

## 2018-07-16 ENCOUNTER — Telehealth (RURAL_HEALTH_CENTER): Payer: Self-pay

## 2018-07-16 NOTE — Telephone Encounter (Signed)
-----   Message from Annamaria Boots, NP sent at 07/16/2018  7:59 AM EDT -----  Regarding: RE: 6/8  If has time  ----- Message -----  From: Jeanell Sparrow, LPN  Sent: 9/45/8592  10:20 AM EDT  To: Annamaria Boots, NP  Subject: 6/8                                              Do you want patient to have labs?

## 2018-07-16 NOTE — Telephone Encounter (Signed)
Will have labs done

## 2018-07-19 ENCOUNTER — Ambulatory Visit
Admission: RE | Admit: 2018-07-19 | Discharge: 2018-07-19 | Disposition: A | Discharge status: Home / Self Care | Payer: BC Managed Care – PPO | Source: Ambulatory Visit | Attending: Family | Admitting: Family

## 2018-07-19 DIAGNOSIS — Z794 Long term (current) use of insulin: Secondary | ICD-10-CM | POA: Insufficient documentation

## 2018-07-19 DIAGNOSIS — I10 Essential (primary) hypertension: Secondary | ICD-10-CM | POA: Insufficient documentation

## 2018-07-19 DIAGNOSIS — E1165 Type 2 diabetes mellitus with hyperglycemia: Secondary | ICD-10-CM | POA: Insufficient documentation

## 2018-07-19 LAB — BASIC METABOLIC PANEL
Anion Gap: 11.2 mMol/L (ref 7.0–18.0)
BUN / Creatinine Ratio: 19.4 Ratio (ref 10.0–30.0)
BUN: 25 mg/dL — ABNORMAL HIGH (ref 7–22)
CO2: 25.5 mMol/L (ref 20.0–30.0)
Calcium: 9.2 mg/dL (ref 8.5–10.5)
Chloride: 110 mMol/L (ref 98–110)
Creatinine: 1.29 mg/dL (ref 0.80–1.30)
EGFR: 62 mL/min/{1.73_m2} (ref 60–150)
Glucose: 111 mg/dL — ABNORMAL HIGH (ref 71–99)
Osmolality Calculated: 288 mOsm/kg (ref 275–300)
Potassium: 4.7 mMol/L (ref 3.5–5.3)
Sodium: 142 mMol/L (ref 136–147)

## 2018-07-19 LAB — LIPID PANEL
Cholesterol: 130 mg/dL (ref 75–199)
Coronary Heart Disease Risk: 4.81
HDL: 27 mg/dL — ABNORMAL LOW (ref 40–55)
LDL Calculated: 57 mg/dL
Triglycerides: 230 mg/dL — ABNORMAL HIGH (ref 10–150)
VLDL: 46 — ABNORMAL HIGH (ref 0–40)

## 2018-07-19 LAB — HEMOGLOBIN A1C: Hgb A1C, %: 6.6 %

## 2018-07-19 LAB — ALT: ALT: 35 U/L (ref 0–55)

## 2018-07-21 NOTE — Progress Notes (Signed)
Results reviewed. Will address at upcoming appointment.

## 2018-07-22 NOTE — Progress Notes (Signed)
Endocrine Provider Progress Note      Patient Name:  [name] [[id number]] DOB: 23-Aug-1965  Date: 07/23/2018  This visit was conducted with the use of interactive audio telecommunication via Telephone that permitted real time communication between [employer] and me.  [name] requested telecommunication visit due to Coronavirus pandemic and consented to participation. [name] received services at home, while I was located at [facility], [place], [place].  Time in: 4:12 PM  Time out:  4:27 PM     Subjective:          [name] is a 53 y.o. male who presents for follow up of type 2 diabetes mellitus.   Since the last visit, the patient has had problems with blister/infection of foot. He was treated with antibiotics per podiatry. This area has healed up.   He would like to establish primary care at this office since PCP retired.    Monitoring, Compliance and Complications:  Diagnosis Date: ~2000-2001  Prior Diabetic Treatment: Wilder Glade (discontinued due to renal function)  Hgb A1c at time of referral: 11.1% (done 12/20/16). A1c drawn today prior to appointment  HGB A1C goal based on age and comorbidities is 7%.  The patient has the following diabetic complications:  retinopathy  He has had multiple surgeries for osteomyelitis on his left foot in 2017.     Glucose Meter: Reli-On, not sure which model  Glucose Meter Validation Date: never  The patient did not bring the glucose meter or glucose readings today.  Compliance with blood glucose monitoring: fair   The patient is not currently testing home blood glucose.   Glucose readings are as follows:  -      CGM Type:  Freestyle Libre  CGM use fequency:  all of the time   Calibration frequency: This CGM does not require callibration  Unable to share data with [place] as he does not have computer. Phone also "not compatible."   Scanned readings:   AC B 160-174  AC L 99-137  AC D 91-103  HS 101-113      Injections:   Injections are given by patient.   Injections sites include: abdominal wall  Injection compliance: The patient never misses injections.  No problems noted with injection sites.    Takes average Novolog 4-10 units breakfast, 2-4 units lunch, 2-4 units dinner  Lantus 60 units daily    Diet, Exercise, and Weight:     The patient follows no particular diet.  Compliance with diet has been variable.   Exercise: The patient exercises regularly, 6-7 times per week.  more active at work  Weight: Weight has been stable since last visit.    Hypoglycemia:   The patient has experienced no episodes of hypoglycemia since the last visit. Symptoms of hypoglycemia include: shaking, sees spots, nausea.  . The patient treats hypoglycemia with meal    The patient has Nasal Glucagon (Baqsimi). This is up-to-date and family members have been instructed in its use.   The patient does not have a medical alert bracelet or necklace.    Diabetic Education: The patient has never received diabetes education.  Insurance doesn't "cover much of it"        Diabetic Complication Screening/Treatment  Eye exam: Dr. Andres Ege [facility]". Last visit was sometime in 2018.  No evidence of diabetic retinopathy on last eye exam.  Urine microalbumin/creatinine ratio: N/A. The patient has CKD followed by nephrologist.  Dr.  Barmar  Foot Care: The patient reports no new foot problems. The patient checks the feet regularly.  The patient cuts toenails without difficulty.    Monofilament: 05/11/17, partially intact Followed by podiatrist  Aspirin Therapy: The patient is currently taking aspirin at dose indicated in the medication list.   Cardiovascular risk factors: diabetes mellitus, dyslipidemia, hypertension, male gender, obesity (BMI >= 30 kg/m2), sedentary lifestyle and smoking/ tobacco exposure  Ace Inhibitor/ARB: The patient is taking ACE-I/ARB at dose indicated in medication list and is tolerating this well.  Dental care: Patient has not  received dental care in past year due to "bad experience as a child, have phobia"     Current Outpatient Medications   Medication Sig Dispense Refill    aspirin EC 81 MG EC tablet Take 1 tablet (81 mg total) by mouth every evening. 90 tablet 3    atorvastatin (LIPITOR) 40 MG tablet TAKE 1 TABLET BY MOUTH ONCE DAILY 30 tablet 11    Blood Glucose Monitoring Suppl (RELION PRIME MONITOR) Device by Does not apply route USE TO TEST BLOOD GLUCOSE FOUR TIMES DAILY      Cholecalciferol (VITAMIN D) 2000 units tablet Take 2,000 Units by mouth daily      Continuous Blood Gluc Receiver (FREESTYLE LIBRE 14 DAY READER) Device 1 each by Does not apply route 4 (four) times daily 1 each 3    Continuous Blood Gluc Sensor (FREESTYLE LIBRE 14 DAY SENSOR) Misc 1 each by Does not apply route every 14 (fourteen) days 6 each 3    furosemide (LASIX) 20 MG tablet Take 20 mg by mouth daily      gabapentin (NEURONTIN) 300 MG capsule TAKE 1 CAPSULE BY MOUTH THREE TIMES DAILY 270 capsule 0    Glucagon (BAQSIMI TWO PACK) 3 MG/DOSE Powder 1 each by Nasal route as needed (severe HYPOglycemia) 1 each 1    glucose blood (RELION PRIME TEST) test strip 1 each by Other route 4 (four) times daily Use as instructed      insulin glargine (LANTUS SOLOSTAR) 100 UNIT/ML injection pen Inject 60 Units into the skin daily 60 mL 3    Insulin Pen Needle 31G X 6 MM Misc by Does not apply route.Use up to 7 times a day as directed      lisinopril (PRINIVIL,ZESTRIL) 20 MG tablet TAKE 2 TABLETS BY MOUTH NIGHTLY 180 tablet 3    NOVOLOG FLEXPEN 100 UNIT/ML injection pen INJECT SUBCUTANEOUSLY PRIOR TO MEALS PER CORRECTION SCALE UP TO 54 UNITS DAILY. 60 mL 3    VICTOZA 18 MG/3ML injection INJECT 1.8MG  SUBCUTANEOUSLY ONCE DAILY 18 mL 0     No current facility-administered medications for this visit.       Medication review with [name] suggested compliance all of the time.       Immunization History   Administered Date(s) Administered    Td  06/14/2009     The following portions of the patient's history were reviewed and updated as appropriate: allergies, current medications, past family history, past medical history, past social history, past surgical history and problem list.  The following information was also reviewed at today's visit: office notes from referring provider and lab data    Review of Systems  As above.      Objective:      Vital Signs: There were no vitals taken for this visit.  PE  No physical exam.      Lab Review    Lab Results   Component  Value Date    HGBA1CPERCNT 6.6 07/19/2018    HGBA1CPERCNT 6.7 04/11/2018    HGBA1CPERCNT 6.5 01/12/2018    TSH 1.59 09/13/2017    CHOL 130 07/19/2018    HDL 27 (L) 07/19/2018    LDL 57 07/19/2018    TRIG 230 (H) 07/19/2018    ALT 35 07/19/2018    B12 419 09/13/2017    VITD 21 (L) 09/13/2017    GLU 111 (H) 07/19/2018    K 4.7 07/19/2018    CA 9.2 07/19/2018    CREAT 1.29 07/19/2018    CO2 25.5 07/19/2018    EGFR 62 07/19/2018    NA 142 07/19/2018    ALKPHOS 111 09/13/2017      09/13/17 vitamin D2/D3 total 20 (normal 20-50)    Assessment:      1. Type 2 diabetes mellitus with hyperglycemia, with long-term current use of insulin  liraglutide (Victoza) 18 MG/3ML injection    Hemoglobin A1C    Lipid panel    ALT    Basic Metabolic Panel    insulin glargine (LANTUS SOLOSTAR) 100 UNIT/ML injection pen    NovoLOG FlexPen 100 UNIT/ML injection pen   2. Essential hypertension     3. Mixed hyperlipidemia  Lipid panel    ALT     Diabetes: Hemoglobin A1c in target. FBG remains elevated. Insulin regimen adjusted. Encouraged to continue working on lifestyle modifications. Discussed specific areas of diabetes management as detailed under patient instructions below.   Lipids: We have reviewed that the triglycerides.We have discussed that they are likely to improve with good diabetic control.    BP: Not reviewed today.    Vitamin D: Followed by nephrology       Plan:   Below is a summary of information and  instructions we reviewed at today's appointment. Please review this information carefully and call if you have any questions regarding it.     Diabetes information/instructions:   Labwork: You are due for lab work  on or after 10/19/18.   You should fast for 10 hours prior to having this labwork done.  Please take your meter to the lab and check your fingerstick blood sugar while there. Please call or send [place] a message when you get home with the reading you got so we can compare to what the lab got and verify your meter is in range. Do not ask lab staff to check your meter, they do not know how!   Self-monitoring of blood glucose: We recommend that you monitor your blood glucose before breakfast, lunch, dinner and bedtime, and as needed for signs and/or symptoms of hypoglycemia. Please fax, email, drop off or MyChartMessage your blood sugar readings for further adjustment of regimen  in one week  Insulin:  Increase Lantus to 62 units daily. Reduce Novolog to 2 units before meals if glucose above 100. You can skip this dose if you are planning increased phyical activity after meal.  Hypoglycemia prevention and treatment:  Please carry fast-acting carbohydrate source (preferably glucose tablets) with you at all times. Please treat low blood sugar with 15 grams carbohydrate (4 glucose tablets), wait 15 minutes, and recheck sugar. Re-treat if necessary.   Please notify office for any episode of severe hypoglycemia, such as those requiring assistance from someone else.   Diet: Please decrease your intake of simple sugars and starches. Please watch your portions.  Please aim for a consistent carbohydrate intake at each meal. Please try to avoid missing meals.  Exercise: We  have recommended that you begin an exercise regimen.   Foot care:  Please examine feet daily and appropriately treat callouses or lesions. Please avoid being barefoot or wearing of ill fitting shoes. Please use lotion as needed to treat dry skin on feet.   Please follow up with your podiatrist regarding routine foot care and nail trimming  Diabetic screening is up-to-date except for an eye exam and a dental exam.  It is important to have your eyes examined every year and to have regular dental care.     The following additional instructions relate to health conditions other than your diabetes:   Cholesterol/lipids: Your cholesterol is in good range but your triclyerides are elevated. We have reviewed that improvement in diabetic control and decreased intake of "simple" sugars (including fruit, fruit juice, other sweet drinks and sugary foods) can help to improve your triglycerides.    Please contact office to schedule primary care appointment as discussed.    Return in about 3 months (around 10/23/2018).    Annamaria Boots, NP

## 2018-07-23 ENCOUNTER — Other Ambulatory Visit (RURAL_HEALTH_CENTER): Payer: Self-pay | Admitting: Family

## 2018-07-23 ENCOUNTER — Ambulatory Visit: Payer: BC Managed Care – PPO | Attending: Family | Admitting: Family

## 2018-07-23 DIAGNOSIS — Z794 Long term (current) use of insulin: Secondary | ICD-10-CM

## 2018-07-23 DIAGNOSIS — I10 Essential (primary) hypertension: Secondary | ICD-10-CM

## 2018-07-23 DIAGNOSIS — E782 Mixed hyperlipidemia: Secondary | ICD-10-CM

## 2018-07-23 DIAGNOSIS — E1165 Type 2 diabetes mellitus with hyperglycemia: Secondary | ICD-10-CM

## 2018-07-23 MED ORDER — NOVOLOG FLEXPEN 100 UNIT/ML SC SOPN
PEN_INJECTOR | SUBCUTANEOUS | 3 refills | Status: DC
Start: 2018-07-23 — End: 2019-06-24

## 2018-07-23 MED ORDER — VICTOZA 18 MG/3ML SC SOPN
PEN_INJECTOR | SUBCUTANEOUS | 5 refills | Status: DC
Start: 2018-07-23 — End: 2019-10-23

## 2018-07-23 MED ORDER — LANTUS SOLOSTAR 100 UNIT/ML SC SOPN
62.00 [IU] | PEN_INJECTOR | Freq: Every day | SUBCUTANEOUS | 3 refills | Status: DC
Start: 2018-07-23 — End: 2018-11-09

## 2018-07-23 NOTE — Patient Instructions (Signed)
Below is a summary of information and instructions we reviewed at today's appointment. Please review this information carefully and call if you have any questions regarding it.     Diabetes information/instructions:   Labwork: You are due for lab work  on or after 07/11/18.   You should fast for 10 hours prior to having this labwork done.  Please take your meter to the lab and check your fingerstick blood sugar while there. Please call or send [place] a message when you get home with the reading you got so we can compare to what the lab got and verify your meter is in range. Do not ask lab staff to check your meter, they do not know how!   Self-monitoring of blood glucose: We recommend that you monitor your blood glucose before breakfast, lunch, dinner and bedtime, and as needed for signs and/or symptoms of hypoglycemia. Please fax, email, drop off or MyChartMessage your blood sugar readings for further adjustment of regimen  in one week  Insulin:  Increase Lantus to 62 units daily. Reduce Novolog to 2 units before meals if glucose above 100. You can skip this dose if you are planning increased phyical activity after meal.  Hypoglycemia prevention and treatment:  Please carry fast-acting carbohydrate source (preferably glucose tablets) with you at all times. Please treat low blood sugar with 15 grams carbohydrate (4 glucose tablets), wait 15 minutes, and recheck sugar. Re-treat if necessary.   Please notify office for any episode of severe hypoglycemia, such as those requiring assistance from someone else.   Diet: Please decrease your intake of simple sugars and starches. Please watch your portions.  Please aim for a consistent carbohydrate intake at each meal. Please try to avoid missing meals.  Exercise: We have recommended that you begin an exercise regimen.   Foot care:  Please examine feet daily and appropriately treat callouses or lesions. Please avoid being barefoot or wearing of ill fitting shoes. Please use  lotion as needed to treat dry skin on feet.  Please follow up with your podiatrist regarding routine foot care and nail trimming  Diabetic screening is up-to-date except for an eye exam and a dental exam.  It is important to have your eyes examined every year and to have regular dental care.   Follow up with primary care if sinus symptoms persist/worsen, fever    The following additional instructions relate to health conditions other than your diabetes:   Cholesterol/lipids: Your cholesterol is in good range but your triclyerides are elevated. We have reviewed that improvement in diabetic control and decreased intake of "simple" sugars (including fruit, fruit juice, other sweet drinks and sugary foods) can help to improve your triglycerides.    Please contact office to schedule primary care appointment as discussed.      Triglycerides  Does this test have other names?  Lipid panel, fasting lipoprotein panel  What is this test?  This test measures the amount of triglycerides in your blood.  Triglycerides are one of several types of fats in your blood. Other kinds are LDL ("bad") cholesterol and HDL ("good") cholesterol.  Knowing your triglyceride level is important, especially if you have diabetes, areoverweight or a smoker, or are mostly inactive. High triglyceride levels may put you at greater risk for a heart attack or stroke.   This test is part of a group of cholesterol and blood fat tests called a fasting lipoprotein panel, or lipid panel. This panel is recommended for all adults at least once  every 5 years, or as recommended by your healthcare provider.  Knowing your triglyceride level helps your healthcare provider suggest healthy changes to your diet or lifestyle. If you have triglycerides that are high to very high, your provider is more likely to prescribe medicines to lower your triglycerides or your LDL cholesterol.  Why do I need this test?  You may need this test as part of a routine checkup. You may  also need this test if you're overweight, drink too much alcohol, rarely exercise, or have other conditions like high blood pressure or diabetes.  If you are on cholesterol-lowering medicines, you may have this test to see how well your treatment is working.  What other tests might I have along with this test?  Your healthcare provider will order screening tests for LDL, HDL, and total cholesterol.  What do my test results mean?  Test results may vary depending on your age, gender, health history, the method used for the test, and other things. Your test results may not mean you have a problem. Ask your healthcare provider what your test results mean for you.  Results are given in milligrams per deciliter (mg/dL). Normal levels of triglycerides are less than 150 mg/dL.  Here are how higher numbers are classified:   150 to 199 mg/dL: Borderline high   200 to 499 mg/dL: High   500 mg/dL and above: Very high  If you have a high triglyceride level, you have a greater risk for heart attack and stroke.  A triglyceride level above 150 mg/dL also means that you may have an increased risk for metabolic syndrome. This is a cluster of symptoms including high blood pressure, high blood sugar, and high body fat around the waist. These symptoms have been linked to increased risk for diabetes, heart disease, and stroke.  High triglycerides levels can also be caused by certain diseases or inherited conditions.  If your triglyceride level is above 200 mg/dL, your healthcare provider may recommend that you:   Lose weight   Limit high-fat foods containing saturated fats. These are animal fats found in meat, butter, and whole milk.   Limit trans fats, which are found in many processed foods like chips and store-bought cookies   Cut back on drinks with added sugars, such as soda   Limit your alcohol intake   Stop smoking   Control your blood pressure   Exercise for at least 30 minutes a day, 5 days a week   Limit the  calories from fat in your diet to 25% to 35% of your total intake  If your triglycerides are extremely highabove 500 mg/dLyou may have an added risk for problems with your pancreas. You will likely need medicine to lower your levels along with recommended changes in diet and lifestyle.  How is this test done?  The test is done with a blood sample. A needle is used to draw blood from a vein in your arm or hand.  Does this test pose any risks?  Having a blood test with a needle carries some risks. These include bleeding, infection, bruising, and feeling lightheaded. When the needle pricks your arm or hand, you may feel a slight sting or pain. Afterward, the site may be sore.  What might affect my test results?  Not fasting for the required length of time before the test can affect your results. Certain medicines can affect your results, as can drinking alcohol.  How do I prepare for the test?  If  you have this test as part of a cholesterol screening, you will need to not eat or drink anything but water for 9 to 14 hours before the test. Be sure your healthcare provider knows about all medicines, herbs, vitamins, and supplements you are taking. This includes medicines that don't need a prescription and any illicit drugs you may use.  [employer] last reviewed this educational content on 01/15/2016   2000-2020 [employer]. [address] [place] [address]. All rights reserved. This information is not intended as a substitute for professional medical care. Always follow your healthcare professional's instructions.

## 2018-09-06 ENCOUNTER — Other Ambulatory Visit (RURAL_HEALTH_CENTER): Payer: Self-pay | Admitting: Family Medicine

## 2018-09-06 ENCOUNTER — Telehealth (RURAL_HEALTH_CENTER): Payer: Self-pay

## 2018-09-06 DIAGNOSIS — Z794 Long term (current) use of insulin: Secondary | ICD-10-CM

## 2018-09-06 NOTE — Telephone Encounter (Signed)
Please call the patient regarding blood sugar readings received with the following message: Blood sugar readings from 07/31/18 to 08/21/18 received and reviewed.  Overall blood sugars look good. Continue the current regimen with no change and  sending glucose readings periodically.

## 2018-09-06 NOTE — Telephone Encounter (Signed)
7/23 lmtcb

## 2018-09-07 NOTE — Telephone Encounter (Signed)
7/24 lmtcb

## 2018-09-10 ENCOUNTER — Encounter (RURAL_HEALTH_CENTER): Payer: Self-pay

## 2018-09-10 NOTE — Telephone Encounter (Signed)
7/27 lmtcb and letter sent

## 2018-09-17 ENCOUNTER — Other Ambulatory Visit (RURAL_HEALTH_CENTER): Payer: Self-pay | Admitting: Family

## 2018-09-17 DIAGNOSIS — E1165 Type 2 diabetes mellitus with hyperglycemia: Secondary | ICD-10-CM

## 2018-09-17 DIAGNOSIS — Z794 Long term (current) use of insulin: Secondary | ICD-10-CM

## 2018-09-24 ENCOUNTER — Telehealth (RURAL_HEALTH_CENTER): Payer: Self-pay

## 2018-09-24 NOTE — Telephone Encounter (Signed)
Please call the patient regarding blood sugar readings received with the following message: Blood sugar readings from 09/04/18 to 09/14/18 received and reviewed.  Overall blood sugars look good. Continue the current regimen with no change and  sending glucose readings periodically.

## 2018-09-24 NOTE — Telephone Encounter (Signed)
8/10 lmtcb

## 2018-09-25 NOTE — Telephone Encounter (Signed)
8/11 lmtcb

## 2018-09-26 ENCOUNTER — Encounter (RURAL_HEALTH_CENTER): Payer: Self-pay

## 2018-09-26 NOTE — Telephone Encounter (Signed)
Will not send certified letter

## 2018-09-26 NOTE — Telephone Encounter (Addendum)
Please advise patient to follow up with pcp about that symptom.   If waking up out of blue, should check glucose then be sure not getting too low.

## 2018-09-26 NOTE — Telephone Encounter (Signed)
8/12 lmtcb and lmtcb for emergency contact. Certified letter sent.

## 2018-09-26 NOTE — Telephone Encounter (Signed)
Wife called back aware and understands

## 2018-09-26 NOTE — Telephone Encounter (Signed)
8/12 left detailed message like wife asked

## 2018-09-26 NOTE — Telephone Encounter (Signed)
Wife called back and will relay message to patient. States he is waking up in the middle of the night screaming with severe leg cramps

## 2018-10-01 ENCOUNTER — Ambulatory Visit (RURAL_HEALTH_CENTER): Payer: Self-pay | Admitting: Family Medicine

## 2018-10-02 ENCOUNTER — Ambulatory Visit (RURAL_HEALTH_CENTER): Payer: BC Managed Care – PPO | Admitting: Family Medicine

## 2018-10-20 ENCOUNTER — Other Ambulatory Visit (RURAL_HEALTH_CENTER): Payer: Self-pay | Admitting: Family Medicine

## 2018-10-20 DIAGNOSIS — E781 Pure hyperglyceridemia: Secondary | ICD-10-CM

## 2018-10-23 ENCOUNTER — Other Ambulatory Visit (RURAL_HEALTH_CENTER): Payer: Self-pay | Admitting: Nurse Practitioner

## 2018-10-23 DIAGNOSIS — E782 Mixed hyperlipidemia: Secondary | ICD-10-CM

## 2018-10-23 DIAGNOSIS — I10 Essential (primary) hypertension: Secondary | ICD-10-CM

## 2018-10-23 NOTE — Telephone Encounter (Signed)
Left message with patient

## 2018-10-23 NOTE — Telephone Encounter (Signed)
Patient needs an appointment with fasting labs prior to additional refills.     Sindy Messing, FNP-C

## 2018-11-01 ENCOUNTER — Telehealth (RURAL_HEALTH_CENTER): Payer: Self-pay

## 2018-11-01 NOTE — Telephone Encounter (Signed)
9/17 left a detailed message to remind patient that he is due for labs before appt

## 2018-11-07 ENCOUNTER — Ambulatory Visit
Admission: RE | Admit: 2018-11-07 | Discharge: 2018-11-07 | Disposition: A | Discharge status: Home / Self Care | Payer: BC Managed Care – PPO | Source: Ambulatory Visit | Attending: Family | Admitting: Family

## 2018-11-07 DIAGNOSIS — E782 Mixed hyperlipidemia: Secondary | ICD-10-CM | POA: Insufficient documentation

## 2018-11-07 DIAGNOSIS — Z794 Long term (current) use of insulin: Secondary | ICD-10-CM | POA: Insufficient documentation

## 2018-11-07 DIAGNOSIS — E1165 Type 2 diabetes mellitus with hyperglycemia: Secondary | ICD-10-CM | POA: Insufficient documentation

## 2018-11-07 DIAGNOSIS — I10 Essential (primary) hypertension: Secondary | ICD-10-CM | POA: Insufficient documentation

## 2018-11-07 LAB — COMPREHENSIVE METABOLIC PANEL
ALT: 33 U/L (ref 0–55)
AST (SGOT): 24 U/L (ref 10–42)
Albumin/Globulin Ratio: 0.79 Ratio — ABNORMAL LOW (ref 0.80–2.00)
Albumin: 2.6 gm/dL — ABNORMAL LOW (ref 3.5–5.0)
Alkaline Phosphatase: 106 U/L (ref 40–145)
Anion Gap: 12.7 mMol/L (ref 7.0–18.0)
BUN / Creatinine Ratio: 20.6 Ratio (ref 10.0–30.0)
BUN: 27 mg/dL — ABNORMAL HIGH (ref 7–22)
Bilirubin, Total: 0.2 mg/dL (ref 0.1–1.2)
CO2: 24.6 mMol/L (ref 20.0–30.0)
Calcium: 8.1 mg/dL — ABNORMAL LOW (ref 8.5–10.5)
Chloride: 107 mMol/L (ref 98–110)
Creatinine: 1.31 mg/dL — ABNORMAL HIGH (ref 0.80–1.30)
EGFR: 61 mL/min/{1.73_m2} (ref 60–150)
Globulin: 3.3 gm/dL (ref 2.0–4.0)
Glucose: 115 mg/dL — ABNORMAL HIGH (ref 71–99)
Osmolality Calculated: 285 mOsm/kg (ref 275–300)
Potassium: 4.3 mMol/L (ref 3.5–5.3)
Protein, Total: 5.9 gm/dL — ABNORMAL LOW (ref 6.0–8.3)
Sodium: 140 mMol/L (ref 136–147)

## 2018-11-07 LAB — LIPID PANEL
Cholesterol: 161 mg/dL (ref 75–199)
Coronary Heart Disease Risk: 5.37
HDL: 30 mg/dL — ABNORMAL LOW (ref 40–55)
LDL Calculated: 73 mg/dL
Triglycerides: 291 mg/dL — ABNORMAL HIGH (ref 10–150)
VLDL: 58 — ABNORMAL HIGH (ref 0–40)

## 2018-11-08 LAB — HEMOGLOBIN A1C: Hgb A1C, %: 7.2 %

## 2018-11-08 NOTE — Progress Notes (Signed)
Results reviewed. Will address at upcoming appointment.

## 2018-11-09 ENCOUNTER — Ambulatory Visit: Payer: BC Managed Care – PPO | Attending: Family | Admitting: Family

## 2018-11-09 ENCOUNTER — Encounter (RURAL_HEALTH_CENTER): Payer: Self-pay | Admitting: Family

## 2018-11-09 VITALS — Wt 250.0 lb

## 2018-11-09 DIAGNOSIS — E1165 Type 2 diabetes mellitus with hyperglycemia: Secondary | ICD-10-CM

## 2018-11-09 DIAGNOSIS — E559 Vitamin D deficiency, unspecified: Secondary | ICD-10-CM

## 2018-11-09 DIAGNOSIS — Z794 Long term (current) use of insulin: Secondary | ICD-10-CM

## 2018-11-09 DIAGNOSIS — E782 Mixed hyperlipidemia: Secondary | ICD-10-CM

## 2018-11-09 MED ORDER — LANTUS SOLOSTAR 100 UNIT/ML SC SOPN
64.00 [IU] | PEN_INJECTOR | Freq: Every day | SUBCUTANEOUS | 3 refills | Status: DC
Start: 2018-11-09 — End: 2019-02-19

## 2018-11-09 NOTE — Patient Instructions (Signed)
Below is a summary of information and instructions we reviewed at today's appointment. Please review this information carefully and call if you have any questions regarding it.     Diabetes information/instructions:   Labwork: You are due for lab work  on or after 02/06/19.   You should fast for 10 hours prior to having this labwork done.  Please take your meter to the lab and check your fingerstick blood sugar while there. Please call or send [place] a message when you get home with the reading you got so we can compare to what the lab got and verify your meter is in range. Do not ask lab staff to check your meter, they do not know how!   Self-monitoring of blood glucose: We recommend that you monitor your blood glucose before breakfast, lunch, dinner and bedtime, and as needed for signs and/or symptoms of hypoglycemia. Please fax, email, drop off or MyChartMessage your blood sugar readings for further adjustment of regimen  in one week  Insulin:  Increase Lantus to 64 units daily. Reduce Novolog to 2 units for breakfast and lunch and 4 units by dinner.  Hypoglycemia prevention and treatment:  Please carry fast-acting carbohydrate source (preferably glucose tablets) with you at all times. Please treat low blood sugar with 15 grams carbohydrate (4 glucose tablets), wait 15 minutes, and recheck sugar. Re-treat if necessary.   Please notify office for any episode of severe hypoglycemia, such as those requiring assistance from someone else.   Diet: Please decrease your intake of simple sugars and starches. Please watch your portions.  Please aim for a consistent carbohydrate intake at each meal. Please try to avoid missing meals.  Exercise: We have recommended that you begin an exercise regimen.   Foot care:  Please examine feet daily and appropriately treat callouses or lesions. Please avoid being barefoot or wearing of ill fitting shoes. Please use lotion as needed to treat dry skin on feet.  Please follow up with your  podiatrist regarding routine foot care and nail trimming  Diabetic screening is up-to-date except for an eye exam and a dental exam.  It is important to have your eyes examined every year and to have regular dental care.  You may wish to consider sedation dentistry. One provider is Dr. Burnadette Peter at [facility], [phone].    The following additional instructions relate to health conditions other than your diabetes:   Cholesterol/lipids: Your cholesterol is in good range but your triclyerides are elevated. We have reviewed that improvement in diabetic control and decreased intake of "simple" sugars (including fruit, fruit juice, other sweet drinks and sugary foods) can help to improve your triglycerides.    Please follow up with primary care as planned.

## 2018-11-09 NOTE — Progress Notes (Signed)
Endocrine Provider Progress Note      Patient Name:  [name] [[id number]] DOB: January 16, 1966  Date: 11/09/2018  This visit was conducted with the use of interactive audio telecommunication via Telephone that permitted real time communication between [employer] and me.  [name] requested telecommunication visit due to Coronavirus pandemic and consented to participation. [name] received services at home, while I was located at [facility], [place], [place].    Time in: 4:23 PM  Time out:  4:41 PM   Greater than 50% of the office visit was dedicated to counseling, reviewing tests & labs, treatment options and follow up plans.     Subjective:          [name] is a 53 y.o. male who presents for follow up of type 2 diabetes mellitus.   Since the last visit, the patient has had problems with cramping in legs and sometimes hands. He notes a history of restless leg syndrome. He has never had a sleep study.    Monitoring, Compliance and Complications:  Diagnosis Date: ~2000-2001  Prior Diabetic Treatment: Wilder Glade (discontinued due to renal function)  Hgb A1c at time of referral: 11.1% (done 12/20/16). A1c drawn today prior to appointment  HGB A1C goal based on age and comorbidities is 7%.  The patient has the following diabetic complications:  retinopathy  He has had multiple surgeries for osteomyelitis on his left foot in 2017.     Glucose Meter: Reli-On, not sure which model  Glucose Meter Validation Date: never  The patient did not bring the glucose meter or glucose readings today.  Compliance with blood glucose monitoring: fair   The patient is not currently testing home blood glucose.   Glucose readings are as follows:  -      CGM Type:  Freestyle Libre  CGM use fequency:  all of the time   Calibration frequency: This CGM does not require callibration  Unable to share data with [place] as he does not have computer. Phone also "not  compatible."   Scanned readings 9/21-9/25:   AC B 109-156  AC L 98-130  AC D 81-111  HS 97-156    Injections:   Injections are given by patient.   Injections sites include: abdominal wall  Injection compliance: The patient never misses injections.  No problems noted with injection sites.      Diet, Exercise, and Weight:     The patient follows no particular diet.  Compliance with diet has been variable.   Exercise: The patient exercises intermittently.  Active in yard in evening and on weekends  Weight: Weight has been stable since last visit.    Hypoglycemia:   The patient has experienced no episodes of hypoglycemia since the last visit. Symptoms of hypoglycemia include: shaking, sees spots, nausea. Has symptoms on occasion if wait too long between meals . The patient treats hypoglycemia with meal  or carb snack  The patient has Nasal Glucagon (Baqsimi). This is up-to-date and family members have been instructed in its use.   The patient does not have a medical alert bracelet or necklace.    Diabetic Education: The patient has never received diabetes education.  Insurance doesn't "cover much of it"        Diabetic Complication Screening/Treatment  Eye exam: Dr. [facility]". Last visit was two months ago. "Some damage there. I assume blood vessels. Has f/u next month.   Urine microalbumin/creatinine  ratio: N/A. The patient has CKD followed by nephrologist.  Dr. Elby Beck  Foot Care: The patient reports no new foot problems. The patient checks the feet regularly.  The patient cuts toenails without difficulty.    Monofilament: 05/11/17, partially intact Followed by podiatrist  Aspirin Therapy: The patient is currently taking aspirin at dose indicated in the medication list.   Cardiovascular risk factors: diabetes mellitus, dyslipidemia, hypertension, male gender, obesity (BMI >= 30 kg/m2), sedentary lifestyle and smoking/ tobacco exposure  Ace Inhibitor/ARB: The patient is taking ACE-I/ARB at dose indicated in  medication list and is tolerating this well.  Dental care: Patient has not received dental care in past year due to "bad experience as a child, have phobia"     Current Outpatient Medications   Medication Sig Dispense Refill    aspirin EC 81 MG EC tablet Take 1 tablet (81 mg total) by mouth every evening. 90 tablet 3    atorvastatin (LIPITOR) 40 MG tablet Take 1 tablet by mouth once daily 30 tablet 0    Blood Glucose Monitoring Suppl (RELION PRIME MONITOR) Device by Does not apply route USE TO TEST BLOOD GLUCOSE FOUR TIMES DAILY      Cholecalciferol (VITAMIN D) 2000 units tablet Take 2,000 Units by mouth daily      Continuous Blood Gluc Receiver (FREESTYLE LIBRE 14 DAY READER) Device 1 each by Does not apply route 4 (four) times daily 1 each 3    Continuous Blood Gluc Sensor (FreeStyle Libre 14 Day Sensor) Misc INJECT INTO SKIN EVERY 14 DAYS 6 each 0    furosemide (LASIX) 20 MG tablet Take 20 mg by mouth daily      gabapentin (NEURONTIN) 300 MG capsule TAKE 1 CAPSULE BY MOUTH THREE TIMES DAILY 270 capsule 3    Glucagon (BAQSIMI TWO PACK) 3 MG/DOSE Powder 1 each by Nasal route as needed (severe HYPOglycemia) 1 each 1    glucose blood (RELION PRIME TEST) test strip 1 each by Other route 4 (four) times daily Use as instructed      insulin glargine (LANTUS SOLOSTAR) 100 UNIT/ML injection pen Inject 62 Units into the skin daily 60 mL 3    Insulin Pen Needle 31G X 6 MM Misc by Does not apply route.Use up to 7 times a day as directed      liraglutide (Victoza) 18 MG/3ML injection INJECT 1.8MG  SUBCUTANEOUSLY ONCE DAILY 9 mL 5    lisinopril (PRINIVIL,ZESTRIL) 20 MG tablet TAKE 2 TABLETS BY MOUTH NIGHTLY 180 tablet 3    NovoLOG FlexPen 100 UNIT/ML injection pen INJECT SUBCUTANEOUSLY PRIOR TO MEALS PER CORRECTION SCALE UP TO 20 UNITS DAILY. 60 mL 3     No current facility-administered medications for this visit.       Medication review with [name] suggested compliance all of the time.        Immunization History   Administered Date(s) Administered    Td 06/14/2009     The following portions of the patient's history were reviewed and updated as appropriate: current medications, past medical history and problem list.  The following information was also reviewed at today's visit: lab data    Review of Systems  Positive for cramping legs and hands, leg cramps worse at night.. Negative for CP, SOB or edema.      Objective:      Vital Signs: Wt 113.4 kg (250 lb)    BMI 37.32 kg/m   PE  No physical exam.  Lab Review    Lab Results   Component Value Date    HGBA1CPERCNT 7.2 11/07/2018    HGBA1CPERCNT 6.6 07/19/2018    HGBA1CPERCNT 6.7 04/11/2018    TSH 1.59 09/13/2017    CHOL 161 11/07/2018    HDL 30 (L) 11/07/2018    LDL 73 11/07/2018    TRIG 291 (H) 11/07/2018    ALT 33 11/07/2018    B12 419 09/13/2017    VITD 21 (L) 09/13/2017    GLU 115 (H) 11/07/2018    K 4.3 11/07/2018    CA 8.1 (L) 11/07/2018    CREAT 1.31 (H) 11/07/2018    CO2 24.6 11/07/2018    EGFR 61 11/07/2018    NA 140 11/07/2018    ALKPHOS 106 11/07/2018      09/13/17 vitamin D2/D3 total 20 (normal 20-50)    Assessment:      1. Type 2 diabetes mellitus with hyperglycemia, with long-term current use of insulin  insulin glargine (LANTUS SOLOSTAR) 100 UNIT/ML injection pen   2. Mixed hyperlipidemia     3. Vitamin D deficiency  Vitamin D,25 OH, Total     Diabetes: Diabetes is suboptimally controlled. Discussed specific areas of diabetes management as detailed under patient instructions below.   Lipids: We have reviewed that the triglycerides.We have discussed that they are likely to improve with good diabetic control.    Vitamin D: Will check vitamin D now.     Plan:   Below is a summary of information and instructions we reviewed at today's appointment. Please review this information carefully and call if you have any questions regarding it.     Diabetes information/instructions:   Labwork: You are due for lab work  on or after 02/06/19.    You should fast for 10 hours prior to having this labwork done.  Please take your meter to the lab and check your fingerstick blood sugar while there. Please call or send [place] a message when you get home with the reading you got so we can compare to what the lab got and verify your meter is in range. Do not ask lab staff to check your meter, they do not know how!   Self-monitoring of blood glucose: We recommend that you monitor your blood glucose before breakfast, lunch, dinner and bedtime, and as needed for signs and/or symptoms of hypoglycemia. Please fax, email, drop off or MyChartMessage your blood sugar readings for further adjustment of regimen  in one week  Insulin:  Increase Lantus to 64 units daily. Reduce Novolog to 2 units for breakfast and lunch and 4 units by dinner.  Hypoglycemia prevention and treatment:  Please carry fast-acting carbohydrate source (preferably glucose tablets) with you at all times. Please treat low blood sugar with 15 grams carbohydrate (4 glucose tablets), wait 15 minutes, and recheck sugar. Re-treat if necessary.   Please notify office for any episode of severe hypoglycemia, such as those requiring assistance from someone else.   Diet: Please decrease your intake of simple sugars and starches. Please watch your portions.  Please aim for a consistent carbohydrate intake at each meal. Please try to avoid missing meals.  Exercise: We have recommended that you begin an exercise regimen.   Foot care:  Please examine feet daily and appropriately treat callouses or lesions. Please avoid being barefoot or wearing of ill fitting shoes. Please use lotion as needed to treat dry skin on feet.  Please follow up with your podiatrist regarding routine foot care and nail  trimming  Diabetic screening is up-to-date except for an eye exam and a dental exam.  It is important to have your eyes examined every year and to have regular dental care.  You may wish to consider sedation dentistry. One  provider is Dr. Burnadette Peter at [facility], [phone].    The following additional instructions relate to health conditions other than your diabetes:   Cholesterol/lipids: Your cholesterol is in good range but your triclyerides are elevated. We have reviewed that improvement in diabetic control and decreased intake of "simple" sugars (including fruit, fruit juice, other sweet drinks and sugary foods) can help to improve your triglycerides.    Please follow up with primary care as planned.    Return in about 3 months (around 02/08/2019).    Annamaria Boots, NP

## 2018-11-13 NOTE — Progress Notes (Signed)
Unable to LM. CNR

## 2018-11-14 ENCOUNTER — Telehealth (RURAL_HEALTH_CENTER): Payer: Self-pay | Admitting: Family Medicine

## 2018-11-14 NOTE — Progress Notes (Signed)
LMTCB CNR

## 2018-11-14 NOTE — Telephone Encounter (Signed)
Tried to call and contact patient about rescheduling his appointment, but the mail box was full and I was unable to leave a message.

## 2018-11-24 ENCOUNTER — Other Ambulatory Visit (RURAL_HEALTH_CENTER): Payer: Self-pay | Admitting: Nurse Practitioner

## 2018-11-24 DIAGNOSIS — E781 Pure hyperglyceridemia: Secondary | ICD-10-CM

## 2018-11-30 ENCOUNTER — Other Ambulatory Visit (RURAL_HEALTH_CENTER): Payer: Self-pay | Admitting: Nurse Practitioner

## 2018-11-30 DIAGNOSIS — E781 Pure hyperglyceridemia: Secondary | ICD-10-CM

## 2018-12-06 ENCOUNTER — Emergency Department
Admission: EM | Admit: 2018-12-06 | Discharge: 2018-12-06 | Disposition: A | Discharge status: Home / Self Care | Payer: BC Managed Care – PPO | Attending: Emergency Medicine | Admitting: Emergency Medicine

## 2018-12-06 DIAGNOSIS — K029 Dental caries, unspecified: Secondary | ICD-10-CM | POA: Insufficient documentation

## 2018-12-06 MED ORDER — PENICILLIN V POTASSIUM 500 MG PO TABS
500.0000 mg | ORAL_TABLET | Freq: Four times a day (QID) | ORAL | 0 refills | Status: AC
Start: 2018-12-06 — End: 2018-12-13

## 2018-12-06 MED ORDER — OXYCODONE-ACETAMINOPHEN 5-325 MG PO TABS
1.0000 | ORAL_TABLET | Freq: Four times a day (QID) | ORAL | 0 refills | Status: DC | PRN
Start: 2018-12-06 — End: 2019-01-13

## 2018-12-06 NOTE — ED Provider Notes (Signed)
ED DOC NOTE      History provided by the patient    History is limited by none    HPI     53 y.o. male  Here for dental pain, long standing but getting worse hasn't seen a dentist in "quite some time because im afraid and they need to put me to sleep"      Associated symptoms no fevers no chills no chest pain no shortness of breath        Review of Systems   Review of Systems   All other systems reviewed and are negative.          PAST MEDICAL/SURGICAL HISTORY:  Past Medical History:   Diagnosis Date    Complication of anesthesia     last time he was kicking his legs had to wake him up    Diabetes mellitus     Diabetic ulcer of left foot 12/26/14    Disc     Gastroesophageal reflux disease     Hyperlipidemia     Hypertension     Seasonal allergic rhinitis     Sleep apnea     not tested    Type 2 diabetes mellitus, controlled       Past Surgical History:   Procedure Laterality Date    ARTHRODESIS, TOE Left 05/04/2015    Procedure: ARTHRODESIS, TOE;  Surgeon: Janora Norlander, DPM;  Location: [facility];  Service: Podiatry;  Laterality: Left;  1st MPJ FUSION    DEBRIDEMENT & IRRIGATION, LOWER EXTREMITY Left 03/12/2015    Procedure: [place], LOWER EXTREMITY;  Surgeon: Janora Norlander, DPM;  [facility];  Service: Podiatry;  Laterality: Left;  I&D LEFT FOOT FOR BONE INFECTION    DEBRIDEMENT & IRRIGATION, LOWER EXTREMITY Left 03/07/2016    Procedure: DEBRIDEMENT & IRRIGATION, LEFT FOOT 2ND TOE, EXCISION OF 2ND METATARSEL;  Surgeon: Janora Norlander, DPM;  [facility];  Service: Podiatry;  Laterality: Left;  I&D Left Foot    HAND SURGERY       Additional Past Medical/Surgical History:       SOCIAL AND FAMILY HISTORY:  Social History     Tobacco Use    Smoking status: Former Smoker     Packs/day: 0.25     Types: Cigarettes     Quit date: 09/06/2017     Years since quitting: 1.2    Smokeless tobacco: Never Used    Tobacco comment:  smokes at work, not at home   Substance Use Topics    Alcohol use: No     Alcohol/week: 0.0 standard drinks     Comment: in past    Drug use: No     Comment: in past     Family History   Problem Relation Age of Onset    Diabetes Mother     Lung cancer Mother     Cancer Mother         lung    Depression Sister     COPD Sister     Cancer Sister         breast    Hypertension Brother         Additional Social and Family History:  Additional SH: [x]   I reviewed SH above       Additional FH: [x]  I reviewed FH above    Allergies   Allergen Reactions    Flu Virus Vaccine Anaphylaxis     Additional Allergies:  Prior to Admission medications    Medication Sig Start Date End Date Taking? Authorizing Provider   aspirin EC 81 MG EC tablet Take 1 tablet (81 mg total) by mouth every evening. 07/01/16   Janeece Riggers T, NP   atorvastatin (LIPITOR) 40 MG tablet Take 1 tablet by mouth once daily 10/23/18   Janeece Riggers T, NP   Blood Glucose Monitoring Suppl (RELION PRIME MONITOR) Device by Does not apply route USE TO TEST BLOOD GLUCOSE FOUR TIMES DAILY    [provider]   Cholecalciferol (VITAMIN D) 2000 units tablet Take 2,000 Units by mouth daily 06/26/17   Costin, Bonita Quin, NP   Continuous Blood Gluc Receiver (FREESTYLE LIBRE 14 DAY READER) Device 1 each by Does not apply route 4 (four) times daily 06/26/17   Costin, Bonita Quin, NP   Continuous Blood Gluc Sensor (FreeStyle Libre 14 Day Sensor) Misc INJECT INTO SKIN EVERY 14 DAYS 09/17/18   Briscoe Burns, MD   furosemide (LASIX) 20 MG tablet Take 20 mg by mouth daily 02/14/18   [provider]   gabapentin (NEURONTIN) 300 MG capsule TAKE 1 CAPSULE BY MOUTH THREE TIMES DAILY 09/08/18   Rhunette Croft, MD   Glucagon (BAQSIMI TWO PACK) 3 MG/DOSE Powder 1 each by Nasal route as needed (severe HYPOglycemia) 04/18/18   Costin, Bonita Quin, NP   glucose blood (RELION PRIME TEST) test strip 1 each by Other route 4 (four) times daily Use as instructed    [provider]   insulin glargine (LANTUS SOLOSTAR) 100 UNIT/ML injection pen Inject 64 Units into the skin daily 11/09/18   Annamaria Boots, NP   Insulin Pen Needle 31G X 6 MM Misc by Does not apply route.Use up to 7 times a day as directed    [provider]   liraglutide (Victoza) 18 MG/3ML injection INJECT 1.8MG  SUBCUTANEOUSLY ONCE DAILY 07/23/18   Costin, Bonita Quin, NP   lisinopril (PRINIVIL,ZESTRIL) 20 MG tablet TAKE 2 TABLETS BY MOUTH NIGHTLY 05/31/18   Rhunette Croft, MD   NovoLOG FlexPen 100 UNIT/ML injection pen INJECT SUBCUTANEOUSLY PRIOR TO MEALS PER CORRECTION SCALE UP TO 20 UNITS DAILY. 07/23/18   Annamaria Boots, NP       PHYSICAL EXAM   Vitals:    12/06/18 1308   BP: (!) 191/101   Pulse: 95   Resp: 16   Temp: 98.2 F (36.8 C)   SpO2: 100%     [x]  Nursing note reviewed [x]  Vitals reviewed   Pulse Oximetry on Room Air Normal  Physical Exam  HENT:      Head: Normocephalic and atraumatic.      Mouth/Throat:      Mouth: Mucous membranes are moist.      Dentition: Does not have dentures. No gingival swelling or dental abscesses.      Comments: Overall very poor dentition with multiple caries and fractured teeth to gum line  Neck:      Musculoskeletal: Normal range of motion.   Pulmonary:      Effort: Pulmonary effort is normal.   Abdominal:      General: Abdomen is flat.   Lymphadenopathy:      Cervical: No cervical adenopathy.   Skin:     Capillary Refill: Capillary refill takes less than 2 seconds.   Neurological:      General: No focal deficit present.      Mental Status: He is alert.   Psychiatric:  Mood and Affect: Mood normal.         LABS/TESTS:  Results     ** No results found for the last 24 hours. **            No results found.          EKG:       ED COURSE:  BP (!) 191/101    Pulse 95    Temp 98.2 F (36.8 C) (Skin)    Resp 16    Ht 1.743 m    Wt 112.2 kg    SpO2 100%    BMI 36.93 kg/m          PROCEDURES:        IMPRESSION:     53 y.o. male with  No diagnosis  found.    DIFFERENTIAL DIAGNOSIS:  Includes but is not limited to  ABSCESS, ANUG, DENTAL CARIES, DENTAL FRACTURE, DENTAL AVULSION.      MEDICAL DECISION MAKING:  I have evaluated all labs and imaging studies in the scope of a single Emergency Department visit and have determined that at this time an acute life threatening illness  does not exist. I have explained all results to the patient and all questions have been answered.  The patient have been given strict return instructions to return to the ER for any worsening or changing symptoms and that they should follow up with a primary care provider. The patient was discharge home in stable condition.      PLAN:   ED Disposition     None        New Prescriptions    No medications on file          Paula Libra, MD  12/06/18 1335

## 2018-12-06 NOTE — Discharge Instructions (Signed)
Understanding Tooth Decay  Plaque is a sticky coating of bacteria and other substances that forms on your teeth and gums. It can cause 2 serious problems:tooth decay and gum disease. These problems damage the teeth and gums. They may even lead to tooth loss. When the mouth is well cared for, tooth decay and gum disease can be reversed in their early stages. Better yet, you can prevent these problems from starting by:    Brushing and flossing daily   Maintaining a healthy diet   Not snacking between meals on foods high in sugarand starch  How tooth decay occurs  Tooth decay happens when bacteria in plaque make acids that eat away at the tooth.Cavities (also called caries) are holes that form in the teeth. They are most common in places that are hard to reach with a toothbrush. This includes the grooves at the tops of the back teeth, and on the sides where the teeth touch. In late stages, tooth decay can be painful. It can also lead to tooth loss.   Treating tooth decay  Tooth decaycan be treated to keep it from moving fartherinto the tooth. This is often done by filling cavities. First any tooth decay is removed. This protects the tooth from more damage. Then the cavity is filled with a hard material. This filling protects the damaged tooth and restores the tooth's surface. If the tooth is severely damaged by decay, other treatments are available.   Follow-up visits  Visit your dental team at least every 6 months for a checkup and cleaning. If youre being treated for tooth decay or gum disease, you may need more frequent visits. These visits will likely decrease as your mouth care effortsstart to pay off. Keep flossing and brushing, and maintain a healthy diet. Followany special instructions your dentist or Copywriter, advertising gives you. And enjoy flashing your healthy smile!      Once tooth decay eats through the enamel, it spreads quickly through the softer dentin.       After tooth decay is removed, the  hole is filled with a hard material.      StayWell last reviewed this educational content on 09/15/2018   2000-2020 [employer]. [address] [place] [address]. All rights reserved. This information is not intended as a substitute for professional medical care. Always follow your healthcare professional's instructions.

## 2018-12-06 NOTE — ED Triage Notes (Signed)
Pt states left sided dental pain x a couple days. Pt believes it is a tooth on the lower left. Pt states he is unable to eat. C/o swelling to the left jaw and upper lip. Pain radiates behind left ear. Denies known fever. Does not regularly see a dentist.

## 2018-12-15 ENCOUNTER — Other Ambulatory Visit (RURAL_HEALTH_CENTER): Payer: Self-pay | Admitting: Internal Medicine

## 2018-12-15 DIAGNOSIS — E1165 Type 2 diabetes mellitus with hyperglycemia: Secondary | ICD-10-CM

## 2018-12-15 DIAGNOSIS — Z794 Long term (current) use of insulin: Secondary | ICD-10-CM

## 2019-01-13 ENCOUNTER — Emergency Department
Admission: EM | Admit: 2019-01-13 | Discharge: 2019-01-13 | Disposition: A | Discharge status: Home / Self Care | Payer: BC Managed Care – PPO | Attending: Emergency Medicine | Admitting: Emergency Medicine

## 2019-01-13 DIAGNOSIS — K047 Periapical abscess without sinus: Secondary | ICD-10-CM | POA: Insufficient documentation

## 2019-01-13 MED ORDER — AMOXICILLIN 500 MG PO TABS
500.0000 mg | ORAL_TABLET | Freq: Once | ORAL | Status: AC
Start: 2019-01-13 — End: 2019-01-13
  Administered 2019-01-13: 17:00:00 500 mg via ORAL

## 2019-01-13 MED ORDER — AMOXICILLIN 500 MG PO TABS
ORAL_TABLET | ORAL | Status: AC
Start: 2019-01-13 — End: ?
  Filled 2019-01-13: qty 1

## 2019-01-13 MED ORDER — AMOXICILLIN 500 MG PO CAPS
500.00 mg | ORAL_CAPSULE | Freq: Three times a day (TID) | ORAL | 0 refills | Status: AC
Start: 2019-01-13 — End: 2019-01-23

## 2019-01-13 MED ORDER — HYDROCODONE-ACETAMINOPHEN 5-325 MG PO TABS
1.0000 | ORAL_TABLET | Freq: Four times a day (QID) | ORAL | 0 refills | Status: DC | PRN
Start: 2019-01-13 — End: 2019-01-13

## 2019-01-13 MED ORDER — HYDROCODONE-ACETAMINOPHEN 5-325 MG PO TABS
ORAL_TABLET | ORAL | Status: AC
Start: 2019-01-13 — End: ?
  Filled 2019-01-13: qty 2

## 2019-01-13 MED ORDER — HYDROCODONE-ACETAMINOPHEN 5-325 MG PO TABS
2.0000 | ORAL_TABLET | Freq: Once | ORAL | Status: AC
Start: 2019-01-13 — End: 2019-01-13
  Administered 2019-01-13: 17:00:00 2 via ORAL

## 2019-01-13 MED ORDER — HYDROCODONE-ACETAMINOPHEN 5-325 MG PO TABS
1.0000 | ORAL_TABLET | Freq: Four times a day (QID) | ORAL | 0 refills | Status: DC | PRN
Start: 2019-01-13 — End: 2019-02-19

## 2019-01-13 MED ORDER — AMOXICILLIN 500 MG PO CAPS
500.00 mg | ORAL_CAPSULE | Freq: Three times a day (TID) | ORAL | 0 refills | Status: DC
Start: 2019-01-13 — End: 2019-01-13

## 2019-01-13 NOTE — Discharge Instructions (Signed)
Dental Abscess    An abscess is a pocket of fluid (pus) at the tip of a tooth root in your jawbone. It's caused by an infection at the root of the tooth. It can cause pain and swelling of the gum, cheek, or jaw. Pain may spread from the tooth to your ear. Or pain may spread to the part of your jaw on the same side. If the abscess isnt treated, it looks like a bubble or swelling on the gum near the tooth. The pressure that builds in this swelling causes pain. More serious infections make your face swell.  An abscess can occur when bacteria enter the tooth through a crack in the tooth, a cavity, a gum infection, or a combination of these. The pulp inside the tooth gets infected. Then bacteria can spread down the roots to the tip. If the bacteria are not stopped, they can harm the bone and soft tissue. Then an abscess can form.  Symptoms can include pain, redness, or swelling of the gums, and fever.  Home care  Follow these guidelines when caring for yourself at home:   Don't have hot and cold foods and drinks. Your tooth may be sensitive to changes in temperature. Dont chew on the side of the infected tooth.   If your tooth is chipped or cracked, or if there is a large open cavity, put clove oil right on the tooth to ease pain. You can buy clove oil at pharmacies. Some pharmacies carry an over-the-counter toothache kit. This has a paste that you can put on the exposed tooth to make it less sensitive.   Put a cold pack on your jaw over the sore area to help ease pain.   You may use over-the-counter medicine to ease pain, unless another medicine was prescribed. If you have chronic liver or kidney disease or if you've had a stomach ulcer or GI bleeding, talk with your healthcare provider before using acetaminophen or ibuprofen.   An antibiotic will be prescribed. Take it until finished, even if you are feeling better after a few days.  Follow-up care  Follow up with your dentist or an oral surgeon, or as  advised. Once an infection occurs in a tooth, it will be a problem until the infection is drained. This is done through surgery or a root canal. Or you may need to have your tooth pulled.  Call 911  Call 911 if any of these occur:   Abnormal drowsiness   Headache or stiff neck   Weakness or fainting   Trouble swallowing, breathing, or opening your mouth   Swollen eyelids  When to get medical advice  Call your healthcare provider right away if any of these occur:   Your face gets more swollen or red   Pain gets worse or spreads to your neck   Fever of 100.53F (38.0C) or higher, or as directed by your provider   Pus drains from the tooth  StayWell last reviewed this educational content on 01/14/2018   2000-2020 [employer]. [address] [place] [address]. All rights reserved. This information is not intended as a substitute for professional medical care. Always follow your healthcare professional's instructions.    LOCAL DENTAL CLINICS     [place], [place]   [phone].  Berlinda Last      [place]      [phone].  Lanae Boast  [place]   [phone].  Titus Mould       [place]    [phone].  Brandt Loosen        [place]    [phone].  [facility] [place]    [phone].  Arther Abbott       [phone].  Sherley Bounds          [place]    [phone].  Darrin Luis        [place]        [phone].  Avelino Leeds        [place]     [phone].  Ranae Palms   [place]      [phone].  Bryson Corona   [phone].  Lurlean Horns ,    [place],      [phone]    FREE DENTAL CLINICS:  [place]:  [phone].  [place]:  [phone].  [facility]: [phone] (most insurances and Medicaid accepted)

## 2019-01-13 NOTE — ED Provider Notes (Signed)
[facility] EMERGENCY DEPARTMENT History and Physical Exam      Patient Name: [name], [name]  Encounter Date:  01/13/2019  Attending Physician: Malena Peer, MD  PCP: Francisco Capuchin, MD  Patient DOB:  1965/08/03  MRN:  [id number]  Room:  E11/EDA11-A      History of Presenting Illness     Chief complaint: Dental Pain    HPI/ROS is limited by: none  HPI/ROS given by: patient    Location: L jaw  Duration: 1 week  Severity: moderate    CEDAR DITULLIO is a 53 y.o. male who presents with pain in left jaw which is progressed to include pain in the left side of his face and swelling left-sided face.  He denies any fevers or vomiting.  He has tried over-the-counter pain medications without relief.  He notes he has bad teeth      Review of Systems     Review of Systems   Constitutional: Negative for fever.   Gastrointestinal: Negative for vomiting.       Allergies     Pt is allergic to flu virus vaccine.    Medications     No current facility-administered medications for this encounter.     Current Outpatient Medications:     aspirin EC 81 MG EC tablet, Take 1 tablet (81 mg total) by mouth every evening., Disp: 90 tablet, Rfl: 3    atorvastatin (LIPITOR) 40 MG tablet, Take 1 tablet by mouth once daily, Disp: 30 tablet, Rfl: 0    Blood Glucose Monitoring Suppl (RELION PRIME MONITOR) Device, by Does not apply route USE TO TEST BLOOD GLUCOSE FOUR TIMES DAILY, Disp: , Rfl:     Cholecalciferol (VITAMIN D) 2000 units tablet, Take 2,000 Units by mouth daily, Disp: , Rfl:     Continuous Blood Gluc Receiver (FREESTYLE LIBRE 14 DAY READER) Device, 1 each by Does not apply route 4 (four) times daily, Disp: 1 each, Rfl: 3    Continuous Blood Gluc Sensor (FreeStyle Libre 14 Day Sensor) Misc, Inject 1 each into the skin every 14 (fourteen) days, Disp: 6 each, Rfl: 0    furosemide (LASIX) 20 MG tablet, Take 20 mg by mouth daily, Disp: , Rfl:     gabapentin (NEURONTIN) 300 MG capsule, TAKE 1 CAPSULE BY MOUTH THREE TIMES  DAILY, Disp: 270 capsule, Rfl: 3    Glucagon (BAQSIMI TWO PACK) 3 MG/DOSE Powder, 1 each by Nasal route as needed (severe HYPOglycemia), Disp: 1 each, Rfl: 1    glucose blood (RELION PRIME TEST) test strip, 1 each by Other route 4 (four) times daily Use as instructed, Disp: , Rfl:     insulin glargine (LANTUS SOLOSTAR) 100 UNIT/ML injection pen, Inject 64 Units into the skin daily, Disp: 60 mL, Rfl: 3    Insulin Pen Needle 31G X 6 MM Misc, by Does not apply route.Use up to 7 times a day as directed, Disp: , Rfl:     liraglutide (Victoza) 18 MG/3ML injection, INJECT 1.8MG  SUBCUTANEOUSLY ONCE DAILY, Disp: 9 mL, Rfl: 5    lisinopril (PRINIVIL,ZESTRIL) 20 MG tablet, TAKE 2 TABLETS BY MOUTH NIGHTLY, Disp: 180 tablet, Rfl: 3    NovoLOG FlexPen 100 UNIT/ML injection pen, INJECT SUBCUTANEOUSLY PRIOR TO MEALS PER CORRECTION SCALE UP TO 20 UNITS DAILY., Disp: 60 mL, Rfl: 3    amoxicillin (AMOXIL) 500 MG capsule, Take 1 capsule (500 mg total) by mouth 3 (three) times daily for 10 days, Disp: 30 capsule, Rfl: 0  HYDROcodone-acetaminophen (NORCO) 5-325 MG per tablet, Take 1-2 tablets by mouth every 6 (six) hours as needed for Pain, Disp: 12 tablet, Rfl: 0     Past Medical History     Pt has a past medical history of Complication of anesthesia, Diabetes mellitus, Diabetic ulcer of left foot (12/26/14), Disc, Gastroesophageal reflux disease, Hyperlipidemia, Hypertension, Seasonal allergic rhinitis, Sleep apnea, and Type 2 diabetes mellitus, controlled.    Past Surgical History     Pt has a past surgical history that includes Hand surgery; DEBRIDEMENT & IRRIGATION, LOWER EXTREMITY (Left, 03/12/2015); ARTHRODESIS, TOE (Left, 05/04/2015); and DEBRIDEMENT & IRRIGATION, LOWER EXTREMITY (Left, 03/07/2016).    Family History     The family history includes COPD in his sister; Cancer in his mother and sister; Depression in his sister; Diabetes in his mother; Hypertension in his brother; Lung cancer in his mother.    Social History      Pt reports that he quit smoking about 16 months ago. His smoking use included cigarettes. He smoked 0.25 packs per day. He has never used smokeless tobacco. He reports that he does not drink alcohol or use drugs.    Physical Exam     Blood pressure 187/89, pulse 91, temperature 98.1 F (36.7 C), temperature source Oral, resp. rate 18, weight 112.2 kg, SpO2 98 %.    Constitutional: Vital signs reviewed. Well appearing.  Head: Normocephalic, atraumatic  Eyes: Conjunctiva and sclera are normal.  No injection or discharge.  Ears, Nose, Throat:  Normal external examination of the nose and ears.  There is left-sided facial swelling.  There is poor dentition.  There is no palpable drainable fluid collection  Neck: Normal range of motion. Trachea midline.  No anterior adenopathy palpated  Respiratory/Chest:  No respiratory distress.    Upper Extremity:  No edema. No cyanosis.  Skin: Warm and dry. No rash.  Psychiatric:  Normal affect.  Normal insight  Neuro: alert.    Orders Placed     No orders of the defined types were placed in this encounter.      Diagnostic Results       The results of the diagnostic studies below have been reviewed by myself:    Labs  Results     ** No results found for the last 24 hours. **          Radiologic Studies  Radiology Results (24 Hour)     ** No results found for the last 24 hours. **          EKG: none      MDM / Critical Care     Blood pressure 187/89, pulse 91, temperature 98.1 F (36.7 C), temperature source Oral, resp. rate 18, weight 112.2 kg, SpO2 98 %.    DDX includes periodontitis, facial cellulitis, dental abscess    ED Course     Patient was treated with amoxicillin and Norco.  He was encouraged to follow-up with dentist ASAP.  He was encouraged to return the emergency department if his swelling increases         Procedures         Diagnosis / Disposition     Clinical Impression  1. Dental abscess        Disposition  ED Disposition     ED Disposition Condition Date/Time  Comment    Discharge  Sun Jan 13, 2019  4:46 PM [name] discharge to home/self care.    Condition at disposition: Stable  Prescriptions  Discharge Medication List as of 01/13/2019  4:46 PM      START taking these medications    Details   amoxicillin (AMOXIL) 500 MG capsule Take 1 capsule (500 mg total) by mouth 3 (three) times daily for 10 days, Starting Sun 01/13/2019, Until Wed 01/23/2019, E-Rx      HYDROcodone-acetaminophen (NORCO) 5-325 MG per tablet Take 1-2 tablets by mouth every 6 (six) hours as needed for Pain, Starting Sun 01/13/2019, E-Rx                        Malena Peer, MD  01/13/19 1733

## 2019-01-13 NOTE — ED Triage Notes (Signed)
Chief Complaint   Patient presents with    Dental Pain     Past Medical History:   Diagnosis Date    Complication of anesthesia     last time he was kicking his legs had to wake him up    Diabetes mellitus     Diabetic ulcer of left foot 12/26/14    Disc     Gastroesophageal reflux disease     Hyperlipidemia     Hypertension     Seasonal allergic rhinitis     Sleep apnea     not tested    Type 2 diabetes mellitus, controlled      BP 187/89 Comment: takes b/p pill at night   Pulse 91    Temp 98.1 F (36.7 C) (Oral)    Resp 18    Wt 112.2 kg    SpO2 98%    BMI 36.93 kg/m

## 2019-02-14 ENCOUNTER — Ambulatory Visit
Admission: RE | Admit: 2019-02-14 | Discharge: 2019-02-14 | Disposition: A | Discharge status: Home / Self Care | Payer: BC Managed Care – PPO | Source: Ambulatory Visit | Attending: Family | Admitting: Family

## 2019-02-14 DIAGNOSIS — E559 Vitamin D deficiency, unspecified: Secondary | ICD-10-CM | POA: Insufficient documentation

## 2019-02-15 DIAGNOSIS — N186 End stage renal disease: Secondary | ICD-10-CM

## 2019-02-15 HISTORY — DX: End stage renal disease: N18.6

## 2019-02-15 LAB — VITAMIN D,25 OH,TOTAL: Vitamin D 25-Hydroxy: 19.6 ng/mL — ABNORMAL LOW (ref 30.0–80.0)

## 2019-02-15 NOTE — Progress Notes (Signed)
Results reviewed. Will address at upcoming appointment.

## 2019-02-18 ENCOUNTER — Other Ambulatory Visit (RURAL_HEALTH_CENTER): Payer: Self-pay

## 2019-02-19 ENCOUNTER — Ambulatory Visit: Payer: BC Managed Care – PPO | Attending: Family | Admitting: Family

## 2019-02-19 ENCOUNTER — Encounter (RURAL_HEALTH_CENTER): Payer: Self-pay | Admitting: Family

## 2019-02-19 VITALS — BP 142/76 | HR 90 | Ht 67.32 in | Wt 256.1 lb

## 2019-02-19 DIAGNOSIS — E1165 Type 2 diabetes mellitus with hyperglycemia: Secondary | ICD-10-CM

## 2019-02-19 DIAGNOSIS — E1142 Type 2 diabetes mellitus with diabetic polyneuropathy: Secondary | ICD-10-CM

## 2019-02-19 DIAGNOSIS — L97521 Non-pressure chronic ulcer of other part of left foot limited to breakdown of skin: Secondary | ICD-10-CM

## 2019-02-19 DIAGNOSIS — Z794 Long term (current) use of insulin: Secondary | ICD-10-CM

## 2019-02-19 DIAGNOSIS — Z87828 Personal history of other (healed) physical injury and trauma: Secondary | ICD-10-CM

## 2019-02-19 DIAGNOSIS — N182 Chronic kidney disease, stage 2 (mild): Secondary | ICD-10-CM

## 2019-02-19 DIAGNOSIS — E559 Vitamin D deficiency, unspecified: Secondary | ICD-10-CM

## 2019-02-19 DIAGNOSIS — E782 Mixed hyperlipidemia: Secondary | ICD-10-CM

## 2019-02-19 MED ORDER — BASAGLAR KWIKPEN 100 UNIT/ML SC SOPN
62.00 [IU] | PEN_INJECTOR | Freq: Every evening | SUBCUTANEOUS | 1 refills | Status: DC
Start: 2019-02-19 — End: 2019-10-23

## 2019-02-19 MED ORDER — ERGOCALCIFEROL 1.25 MG (50000 UT) PO CAPS
50000.00 [IU] | ORAL_CAPSULE | ORAL | 0 refills | Status: DC
Start: 2019-02-19 — End: 2019-05-16

## 2019-02-19 MED ORDER — DEXCOM G6 RECEIVER DEVI
1.00 | Freq: Every day | 0 refills | Status: DC
Start: 2019-02-19 — End: 2021-04-12

## 2019-02-19 MED ORDER — DEXCOM G6 SENSOR MISC
1.00 | 3 refills | Status: DC
Start: 2019-02-19 — End: 2019-10-23

## 2019-02-19 MED ORDER — DEXCOM G6 TRANSMITTER MISC
1.00 | Freq: Every day | 0 refills | Status: DC
Start: 2019-02-19 — End: 2019-05-21

## 2019-02-19 NOTE — Patient Instructions (Addendum)
Below is a summary of information and instructions we reviewed at today's appointment. Please review this information carefully and call if you have any questions regarding it.     Diabetes information/instructions:   Labwork: You are due for lab work  now.  You do not need to fast prior to having this labwork done.  Please take your meter to the lab and check your fingerstick blood sugar while there. Please call or send [place] a message when you get home with the reading you got so we can compare to what the lab got and verify your meter is in range. Do not ask lab staff to check your meter, they do not know how!   Self-monitoring of blood glucose: We recommend that you monitor your blood glucose before breakfast, lunch, dinner and bedtime, and as needed for signs and/or symptoms of hypoglycemia. Please fax, email, drop off or MyChartMessage your blood sugar readings for further adjustment of regimen  in one week  Insulin:  keep doses the same   Hypoglycemia prevention and treatment:  Please carry fast-acting carbohydrate source (preferably glucose tablets) with you at all times. Please treat low blood sugar with 15 grams carbohydrate (4 glucose tablets), wait 15 minutes, and recheck sugar. Re-treat if necessary.   Please notify office for any episode of severe hypoglycemia, such as those requiring assistance from someone else.   Diet: Please decrease your intake of simple sugars and starches. Please watch your portions.  Please aim for a consistent carbohydrate intake at each meal. Please try to avoid missing meals.  Exercise: We have recommended that you begin an exercise regimen.   Foot care:  Please follow up with your podiatrist regarding the ulcer on your left foot. Keep the appt for mid January and if symptoms worsen call him or go to ED sooner   Diabetic screening is up-to-date except for a dental exam. Please schedule an appointment with your dentist.  You may wish to consider sedation dentistry. One  provider is Dr. Burnadette Peter at [facility], [phone].    The following additional instructions relate to health conditions other than your diabetes:     Vitamin D: Your vitamin D level is low.  Start taking Vitamin D2  at a dose of 50,000 IU once weekly .   Cholesterol/lipids: Your cholesterol is in good range but your triclyerides are elevated. We have reviewed that improvement in diabetic control and decreased intake of "simple" sugars (including fruit, fruit juice, other sweet drinks and sugary foods) can help to improve your triglycerides.    RENAL/KIDNEYS: follow up with your kidney specialist to have the protein in your urine checked.        Please follow up with primary care as planned.

## 2019-02-19 NOTE — Progress Notes (Signed)
Endocrine Provider Progress Note      Patient Name:  [name] [[id number]] DOB: 08-16-1965  Date: 02/19/2019    Subjective:          [name] is a 54 y.o. male who presents for follow up of type 2 diabetes mellitus.   Since the last visit, the patient seen in ER for dental infection. Treated and improved. Has had L foot callous for >1 yr that over the last 1-2 weeks has been "shiny" and "opened up". Has been keeping it clean and has an scheduled appointment with Podiatry next week. 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 Circulation checked at the time of that hospitalization was "good" per patient. Additionally, pt taken off of the SGLT-2 t that time d/t concern of amputation risk. Pt was taking SGLT-2 for DM and renal protection from h/o nephrotic range proteinuria. Pt has not seen his nephrologist in over a year d/t cost. He states he has the funds to f/u now and plans to do so.    Pt denies polyuria, polydipsia. Experiencing some hypoglycemia    Monitoring, Compliance and Complications:  Diagnosis Date: ~2000-2001  Prior Diabetic Treatment: Wilder Glade (discontinued due to renal function) -patient reports today Wilder Glade was stopped during hospitalization for osteomyelitis.   Hgb A1c at time of referral: 11.1% (done 12/20/16). A1C due now  HGB A1C goal based on age and comorbidities is 7%.  The patient has the following diabetic complications:  retinopathy, nephropathy and neuropathy  He has had multiple surgeries for osteomyelitis on his left foot in 2017.     Glucose Meter: Reli-On, not sure which model  Glucose Meter Validation Date: never  The patient did not bring the glucose meter or glucose readings today.  Compliance with blood glucose monitoring: fair   The patient is not currently testing home blood glucose.   Glucose readings are as follows:  -    CGM Type:  Freestyle Libre  CGM use fequency:  all of the time   Calibration frequency: This CGM does not require callibration  Percent of  time in target range: 37  For additional CGM data, see scanned CGM download attached to this visit note.   Average sensor glucose: 160  Percent of time above target range: 57  Percent of time below target range: 6         Injections:   Injections are given by patient.   Injections sites include: abdominal wall  Injection compliance: The patient never misses injections.  No problems noted with injection sites.      Diet, Exercise, and Weight:     The patient follows no particular diet.  Compliance with diet has been variable.   Exercise: The patient exercises intermittently.  Active in yard in evening and on weekends  Weight: Weight has increased 4 lbs since last visit.    Hypoglycemia:   The patient has experienced mild occasional hypoglycemia since the last visit. Symptoms of hypoglycemia include: hunger and shaking, blurred vision, nausea. Feels these symptoms when BS are into the 70's . The patient treats hypoglycemia with rice crispy treat or small amount of M&Ms (about 10-15)    The patient has Nasal Glucagon (Baqsimi). This is up-to-date and family members have been instructed in its use.   The patient does not have a medical alert bracelet or necklace.    Diabetic Education: The patient has never received diabetes education.  Would like to see if insurance covers it now since  he has not had it in the past d/t limited coverage         Diabetic Complication Screening/Treatment  Eye exam: Dr. Andres Ege [facility]". Last visit was two months ago. "Some damage there. I assume blood vessels. Has f/u next month.   Urine microalbumin/creatinine ratio: N/A. The patient has CKD followed by nephrologist.  Dr. Elby Beck. Has not seen him on >1 year  Foot Care: The patient reports a new problem with  ulcer on plantar aspect of left MTP    Monofilament: not intact Followed by podiatrist, has appointment next week  Aspirin Therapy: The patient is currently taking aspirin at dose indicated in the medication  list.   Cardiovascular risk factors: diabetes mellitus, dyslipidemia, hypertension, male gender, obesity (BMI >= 30 kg/m2), sedentary lifestyle and smoking/ tobacco exposure  Ace Inhibitor/ARB: The patient is taking ACE-I/ARB at dose indicated in medication list and is tolerating this well.  Dental care: Patient has not received dental care in past year due to "bad experience as a child, have phobia"   dental abcess 11/2018    Current Outpatient Medications   Medication Sig Dispense Refill    aspirin EC 81 MG EC tablet Take 1 tablet (81 mg total) by mouth every evening. 90 tablet 3    atorvastatin (LIPITOR) 40 MG tablet Take 1 tablet by mouth once daily 30 tablet 0    Blood Glucose Monitoring Suppl (RELION PRIME MONITOR) Device by Does not apply route USE TO TEST BLOOD GLUCOSE FOUR TIMES DAILY      Cholecalciferol (VITAMIN D) 2000 units tablet Take 2,000 Units by mouth daily      Continuous Blood Gluc Receiver (Dexcom G6 Receiver) Device 1 Device by Does not apply route daily      Continuous Blood Gluc Sensor (Dexcom G6 Sensor) Misc 1 each by Does not apply route every 10 (ten) days      Continuous Blood Gluc Transmit (Dexcom G6 Transmitter) Misc 1 Device by Does not apply route daily      furosemide (LASIX) 20 MG tablet Take 20 mg by mouth daily      gabapentin (NEURONTIN) 300 MG capsule TAKE 1 CAPSULE BY MOUTH THREE TIMES DAILY 270 capsule 3    Glucagon (BAQSIMI TWO PACK) 3 MG/DOSE Powder 1 each by Nasal route as needed (severe HYPOglycemia) 1 each 1    glucose blood (RELION PRIME TEST) test strip 1 each by Other route 4 (four) times daily Use as instructed      insulin glargine (LANTUS SOLOSTAR) 100 UNIT/ML injection pen Inject 64 Units into the skin daily 60 mL 3    Insulin Pen Needle 31G X 6 MM Misc by Does not apply route.Use up to 7 times a day as directed      liraglutide (Victoza) 18 MG/3ML injection INJECT 1.8MG  SUBCUTANEOUSLY ONCE DAILY 9 mL 5    lisinopril (PRINIVIL,ZESTRIL) 20 MG tablet  TAKE 2 TABLETS BY MOUTH NIGHTLY 180 tablet 3    NovoLOG FlexPen 100 UNIT/ML injection pen INJECT SUBCUTANEOUSLY PRIOR TO MEALS PER CORRECTION SCALE UP TO 20 UNITS DAILY. 60 mL 3     No current facility-administered medications for this visit.       Medication review with [name] suggested compliance all of the time.       Immunization History   Administered Date(s) Administered    Td 06/14/2009     The following portions of the patient's history were reviewed and updated as appropriate: current medications, past medical history  and problem list.  The following information was also reviewed at today's visit: lab data    Review of Systems  Positive for cramping legs and hands, leg cramps worse at night.. Negative for CP, SOB or edema.      Objective:      Vital Signs: BP 142/76    Pulse 90    Ht 1.71 m (5' 7.32")    Wt 116.2 kg (256 lb 1.6 oz)    BMI 39.73 kg/m   PE  Well nourished, well appearing, in no acute distress.  HEENT:  There is no  stare.  There is no periorbital edema.   Neck is supple without adenopathy.  The thyroid is normal in size and consistency and no nodules are appreciated.  Lungs are clear to auscultation.  Cardiac exam reveals a regular rate and rhythm. No murmur or gallop is appreciated.   Exam of the extremities reveals trace non- pitting edema.   Posterior tibial and doralis pedis pulses are normal.  There is no significant deformity of the feet.  Callouses are noted heels and great toes. There is a ~3 cm round open callous on plantar aspect of  left MTP joint. It is open and shiny. No drainage, pain or heat felt over the site. . Onychomycosis is noted in multiple nails on both feet. moderate dry skin is noted on both feet.    On neurologic exam, the patient is alert and appropriate. Gait and speech are normal.  Monofilament sensation is absent  bilaterally  Skin is warm and dry.     Lab Review    Lab Results   Component Value Date    HGBA1CPERCNT 7.2 11/07/2018    HGBA1CPERCNT 6.6  07/19/2018    HGBA1CPERCNT 6.7 04/11/2018    TSH 1.59 09/13/2017    CHOL 161 11/07/2018    HDL 30 (L) 11/07/2018    LDL 73 11/07/2018    TRIG 291 (H) 11/07/2018    ALT 33 11/07/2018    B12 419 09/13/2017    VITD 19.6 (L) 02/14/2019    GLU 115 (H) 11/07/2018    K 4.3 11/07/2018    CA 8.1 (L) 11/07/2018    CREAT 1.31 (H) 11/07/2018    CO2 24.6 11/07/2018    EGFR 61 11/07/2018    NA 140 11/07/2018    ALKPHOS 106 11/07/2018      09/13/17 vitamin D2/D3 total 20 (normal 20-50)    Assessment:      1. Type 2 diabetes mellitus with hyperglycemia, with long-term current use of insulin  Hemoglobin A1C    ALT    Basic Metabolic Panel    Continuous Blood Gluc Receiver (Dexcom G6 Receiver) Device    Continuous Blood Gluc Sensor (Dexcom G6 Sensor) Misc    Continuous Blood Gluc Transmit (Dexcom G6 Transmitter) Misc    insulin glargine (BASAGLAR KWIKPEN) 100 UNIT/ML injection pen    Lipid panel    Ambulatory referral to Diabetic Education    CANCELED: Ambulatory referral to Diabetic Education   2. Diabetic polyneuropathy associated with type 2 diabetes mellitus     3. Ulcer of left foot, limited to breakdown of skin     4. CKD (chronic kidney disease) stage 2, GFR 60-89 ml/min  Basic Metabolic Panel   5. History of kidney injury     6. Mixed hyperlipidemia  Lipid panel   7. Vitamin D deficiency  ergocalciferol (ERGOCALCIFEROL) 1.25 MG (50000 UT) capsule    25-Hydroxyvitamin D2 and D3-S  Diabetes: Diabetes is suboptimally controlled. Discussed specific areas of diabetes management as detailed under patient instructions below. Patient is experiencing hypoglycemia intermittently and post meal BS variability. No dangerous hypoglycemia. Factors: I:CHO mismatch, increase in activity sometimes causes low BS. Hyperglycemia related to too high CHO intake. Pt would benefit from RD counseling to learn about CHO containing foods and better food choices to prevent variability. He also needs to be trained on his new DEXCOM once received.    Lipids: We have reviewed that the triglycerides.We have discussed that they are likely to improve with good diabetic control.    Vitamin D: Below optimal level despite OTC Vitamin D3 2000 IU per day.   Proteinuria/CKD 2: Last protein/cr ratio on file on  09/13/2017 was 6726. Pt has not seen nephrology in >1 year. The SGLT-2 was stopped in 2017 d/t osteomyelitis in left foot (2017) and assoc risk of amputation with SGLT-2 use.   Left foot ulcer: occurred recently (1-2 weeks) without current signs of infection. Ulcer d/t callous formation, neuropathy. Pt will be seeing Podiatry next week and would benefit from diabetic shoes. Circulation is intact.      Plan:   Below is a summary of information and instructions we reviewed at today's appointment. Please review this information carefully and call if you have any questions regarding it.     Diabetes information/instructions:   Labwork: You are due for lab work  now.  You do not need to fast prior to having this labwork done.  Please take your meter to the lab and check your fingerstick blood sugar while there. Please call or send [place] a message when you get home with the reading you got so we can compare to what the lab got and verify your meter is in range. Do not ask lab staff to check your meter, they do not know how!   Self-monitoring of blood glucose: We recommend that you monitor your blood glucose before breakfast, lunch, dinner and bedtime, and as needed for signs and/or symptoms of hypoglycemia. Please fax, email, drop off or MyChartMessage your blood sugar readings for further adjustment of regimen  in one week  Insulin:  keep doses the same   Hypoglycemia prevention and treatment:  Please carry fast-acting carbohydrate source (preferably glucose tablets) with you at all times. Please treat low blood sugar with 15 grams carbohydrate (4 glucose tablets), wait 15 minutes, and recheck sugar. Re-treat if necessary.   Please notify office for any episode of severe  hypoglycemia, such as those requiring assistance from someone else.   Diet: Please decrease your intake of simple sugars and starches. Please watch your portions.  Please aim for a consistent carbohydrate intake at each meal. Please try to avoid missing meals. Advised follow up with RD for MNT   Exercise: We have recommended that you begin an exercise regimen.   Foot care:  Please follow up with your podiatrist regarding the ulcer on your left foot. Keep the appt for mid January and if symptoms worsen call him or go to ED sooner   Diabetic screening is up-to-date except for a dental exam. Please schedule an appointment with your dentist.  You may wish to consider sedation dentistry. One provider is Dr. Burnadette Peter at [facility], [phone].    The following additional instructions relate to health conditions other than your diabetes:     Vitamin D: Your vitamin D level is low.  Start taking Vitamin D2  at a dose of 50,000 IU  once weekly .   Cholesterol/lipids: Your cholesterol is in good range but your triclyerides are elevated. We have reviewed that improvement in diabetic control and decreased intake of "simple" sugars (including fruit, fruit juice, other sweet drinks and sugary foods) can help to improve your triglycerides.    RENAL/KIDNEYS: follow up with your kidney specialist to have the protein in your urine checked.        Please follow up with primary care as planned.    Return in about 3 months (around 05/20/2019).    Annamaria Boots, NP

## 2019-02-20 ENCOUNTER — Telehealth (RURAL_HEALTH_CENTER): Payer: Self-pay

## 2019-02-20 NOTE — Telephone Encounter (Signed)
[name],   This patients insurance will not cover Basaglar, they will cover:  Lantus, Toujeo, Tresiba, Humalog, Levemir, Lantus, Glargine, Apidra and toujeo.

## 2019-02-20 NOTE — Telephone Encounter (Signed)
See message below. He was on lantus and that letter said it wasn't covered that basaglar was

## 2019-03-05 ENCOUNTER — Ambulatory Visit
Admission: RE | Admit: 2019-03-05 | Discharge: 2019-03-05 | Disposition: A | Discharge status: Home / Self Care | Payer: BC Managed Care – PPO | Source: Ambulatory Visit | Attending: Family | Admitting: Family

## 2019-03-05 DIAGNOSIS — E782 Mixed hyperlipidemia: Secondary | ICD-10-CM | POA: Insufficient documentation

## 2019-03-05 DIAGNOSIS — E1165 Type 2 diabetes mellitus with hyperglycemia: Secondary | ICD-10-CM | POA: Insufficient documentation

## 2019-03-05 DIAGNOSIS — N182 Chronic kidney disease, stage 2 (mild): Secondary | ICD-10-CM | POA: Insufficient documentation

## 2019-03-05 DIAGNOSIS — Z794 Long term (current) use of insulin: Secondary | ICD-10-CM | POA: Insufficient documentation

## 2019-03-05 DIAGNOSIS — E559 Vitamin D deficiency, unspecified: Secondary | ICD-10-CM | POA: Insufficient documentation

## 2019-03-05 LAB — BASIC METABOLIC PANEL
Anion Gap: 11.5 mMol/L (ref 7.0–18.0)
BUN / Creatinine Ratio: 17.4 Ratio (ref 10.0–30.0)
BUN: 28 mg/dL — ABNORMAL HIGH (ref 7–22)
CO2: 26.7 mMol/L (ref 20.0–30.0)
Calcium: 8.5 mg/dL (ref 8.5–10.5)
Chloride: 109 mMol/L (ref 98–110)
Creatinine: 1.61 mg/dL — ABNORMAL HIGH (ref 0.80–1.30)
EGFR: 47 mL/min/{1.73_m2} — ABNORMAL LOW (ref 60–150)
Glucose: 92 mg/dL (ref 71–99)
Osmolality Calculated: 290 mOsm/kg (ref 275–300)
Potassium: 4.2 mMol/L (ref 3.5–5.3)
Sodium: 143 mMol/L (ref 136–147)

## 2019-03-05 LAB — LIPID PANEL
Cholesterol: 158 mg/dL (ref 75–199)
Coronary Heart Disease Risk: 4.94
HDL: 32 mg/dL — ABNORMAL LOW (ref 40–55)
LDL Calculated: 60 mg/dL
Triglycerides: 331 mg/dL — ABNORMAL HIGH (ref 10–150)
VLDL: 66 — ABNORMAL HIGH (ref 0–40)

## 2019-03-05 LAB — HEMOGLOBIN A1C: Hgb A1C, %: 7.2 %

## 2019-03-05 LAB — ALT: ALT: 33 U/L (ref 0–55)

## 2019-03-05 NOTE — Progress Notes (Signed)
CC lab results to Dr. Elby Beck.    Please let patient know glucose 92 at the lab.  Triglycerides are elevated.  We have discussed that they are likely to improve with decreased intake of simple sugars and improved diabetic control.   Renal function declined slightly creatinine 1.6.  We will send copy of results to his kidney specialist. Please avoid NSAIDs such as Aleve or Motrin. Please stay hydrated. Please schedule appointment with kidney specialist if not already done so

## 2019-03-07 ENCOUNTER — Encounter (RURAL_HEALTH_CENTER): Payer: Self-pay

## 2019-03-09 ENCOUNTER — Other Ambulatory Visit (RURAL_HEALTH_CENTER): Payer: Self-pay | Admitting: Family

## 2019-03-09 DIAGNOSIS — R7989 Other specified abnormal findings of blood chemistry: Secondary | ICD-10-CM

## 2019-03-09 DIAGNOSIS — E1165 Type 2 diabetes mellitus with hyperglycemia: Secondary | ICD-10-CM

## 2019-03-09 DIAGNOSIS — Z794 Long term (current) use of insulin: Secondary | ICD-10-CM

## 2019-03-09 LAB — VH 25-HYDROXYVITAMIN D2 AND D3-S
25-Hydroxy D Total: 21 ng/mL
25-Hydroxy D2: 9.2 ng/mL
25-Hydroxy D3: 12 ng/mL

## 2019-03-09 NOTE — Progress Notes (Signed)
Could you mail lab order to patient with instruction to have lab rechecked since last one showed kidney function reduced. Assuming he did not yet have labs for kidney specialist. Please be sure he is following up with kidney specialist.

## 2019-03-12 ENCOUNTER — Telehealth (RURAL_HEALTH_CENTER): Payer: Self-pay

## 2019-03-12 NOTE — Telephone Encounter (Signed)
1/26 patient is aware of lab results, states he doesn't have a follow up appointment with kidney specialist and would like a referral to another kidney specialist. I asked the patient if there was a reason so I could let Almyra Free know and he states he just prefers a different one. Patient states understanding with no further questions at this time.     Routed to [employer].

## 2019-03-14 ENCOUNTER — Other Ambulatory Visit (RURAL_HEALTH_CENTER): Payer: Self-pay | Admitting: Family

## 2019-03-14 DIAGNOSIS — R7989 Other specified abnormal findings of blood chemistry: Secondary | ICD-10-CM

## 2019-03-14 DIAGNOSIS — E1165 Type 2 diabetes mellitus with hyperglycemia: Secondary | ICD-10-CM

## 2019-03-14 NOTE — Telephone Encounter (Signed)
done

## 2019-03-14 NOTE — Telephone Encounter (Signed)
[name] Referral placed for Dr. [facility].     Please let patient know referral placed. Recommend get chemistry checked again as renal function was trending down. Avoid NSAIDs such as Aleve/Motrin

## 2019-03-20 ENCOUNTER — Telehealth (RURAL_HEALTH_CENTER): Payer: Self-pay

## 2019-03-20 ENCOUNTER — Ambulatory Visit
Admission: RE | Admit: 2019-03-20 | Discharge: 2019-03-20 | Disposition: A | Discharge status: Home / Self Care | Payer: BC Managed Care – PPO | Source: Ambulatory Visit | Attending: Family | Admitting: Family

## 2019-03-20 DIAGNOSIS — E1165 Type 2 diabetes mellitus with hyperglycemia: Secondary | ICD-10-CM | POA: Insufficient documentation

## 2019-03-20 DIAGNOSIS — R7989 Other specified abnormal findings of blood chemistry: Secondary | ICD-10-CM | POA: Insufficient documentation

## 2019-03-20 DIAGNOSIS — Z794 Long term (current) use of insulin: Secondary | ICD-10-CM | POA: Insufficient documentation

## 2019-03-20 LAB — BASIC METABOLIC PANEL
Anion Gap: 12.3 mMol/L (ref 7.0–18.0)
BUN / Creatinine Ratio: 20.4 Ratio (ref 10.0–30.0)
BUN: 30 mg/dL — ABNORMAL HIGH (ref 7–22)
CO2: 25.2 mMol/L (ref 20.0–30.0)
Calcium: 8.3 mg/dL — ABNORMAL LOW (ref 8.5–10.5)
Chloride: 107 mMol/L (ref 98–110)
Creatinine: 1.47 mg/dL — ABNORMAL HIGH (ref 0.80–1.30)
EGFR: 53 mL/min/{1.73_m2} — ABNORMAL LOW (ref 60–150)
Glucose: 104 mg/dL — ABNORMAL HIGH (ref 71–99)
Osmolality Calculated: 286 mOsm/kg (ref 275–300)
Potassium: 4.5 mMol/L (ref 3.5–5.3)
Sodium: 140 mMol/L (ref 136–147)

## 2019-03-20 NOTE — Telephone Encounter (Signed)
There is a referral in for Nephrology for [facility], patient is established with [facility].  Is he changing offices?

## 2019-03-20 NOTE — Telephone Encounter (Signed)
Yes please review records patient requested different practice

## 2019-04-01 ENCOUNTER — Telehealth (RURAL_HEALTH_CENTER): Payer: Self-pay

## 2019-04-01 NOTE — Telephone Encounter (Signed)
2/15 office closed will try tomorrow

## 2019-04-01 NOTE — Telephone Encounter (Signed)
We see for diabetes, not primary care. I don't see major contraindication (estCrCl 94, ALT ok) However he is followed by podiatry-they could certainly check with them if any specific concerns.

## 2019-04-01 NOTE — Telephone Encounter (Signed)
Dr. Baldemar Friday office called, states pt has athlete's foot and they'd like to start him on Lamisil, wanted to make sure that was okay with our office. Requesting a call back at [phone].

## 2019-04-02 NOTE — Telephone Encounter (Signed)
Dr. Kandice Moos office aware and understands

## 2019-04-17 ENCOUNTER — Ambulatory Visit: Payer: BC Managed Care – PPO | Attending: Family Medicine | Admitting: Family Medicine

## 2019-04-17 ENCOUNTER — Encounter (RURAL_HEALTH_CENTER): Payer: Self-pay | Admitting: Family Medicine

## 2019-04-17 VITALS — BP 146/74 | HR 90 | Temp 99.0°F | Resp 12 | Ht 68.0 in | Wt 248.8 lb

## 2019-04-17 DIAGNOSIS — Z794 Long term (current) use of insulin: Secondary | ICD-10-CM

## 2019-04-17 DIAGNOSIS — E1165 Type 2 diabetes mellitus with hyperglycemia: Secondary | ICD-10-CM

## 2019-04-17 DIAGNOSIS — E1142 Type 2 diabetes mellitus with diabetic polyneuropathy: Secondary | ICD-10-CM

## 2019-04-17 DIAGNOSIS — I10 Essential (primary) hypertension: Secondary | ICD-10-CM

## 2019-04-17 DIAGNOSIS — E1121 Type 2 diabetes mellitus with diabetic nephropathy: Secondary | ICD-10-CM

## 2019-04-17 DIAGNOSIS — E782 Mixed hyperlipidemia: Secondary | ICD-10-CM

## 2019-04-17 NOTE — Progress Notes (Signed)
History and Physical Exam       Patient Name: [name], [name]  Encounter Date:  04/17/2019   Attending Physician: Marijo Sanes, MD  PCP: Almond Lint., MD  Patient DOB:  [birth year]  MRN:  [id number]      History of Presenting Illness     Chief complaint: Establish Care (new patient. Already pt with Dr Duffy Bruce, he is seen by Dr Nada Boozer for Podiatry, he has referrl in for Renal specialists placed by Temple Pacini, NP) and Establish Care (care gaps: colonoscopy, high risk PNA, due low dose CT lungs, urine microalbumin. Pt is due for eye exam will f/u with one. )    HPI/ROS given by: Patient    [name] is a 54 y.o. male who presents with as new patient to me with a history of hypertension hyperlipidemia diabetes and diabetic neuropathy and nephropathy.  He sees endocrinology his A1c is under control his last GFR was 55 out of 60.  He is also established with podiatry.  Preventative no previous colonoscopy he is not interested at this time  No other complaints see review of systems         Review of Systems     Review of Systems   Constitutional: Negative for chills and fever.   Respiratory: Negative for cough and shortness of breath.    Cardiovascular: Negative for chest pain.   Gastrointestinal: Negative for nausea and vomiting.   Genitourinary: Negative for dysuria.   Skin: Negative for color change and rash.   Neurological: Negative for dizziness and syncope.        Allergies & Medications     Pt is allergic to flu virus vaccine.    Current/Home Medications    ASPIRIN EC 81 MG EC TABLET    Take 1 tablet (81 mg total) by mouth every evening.    ATORVASTATIN (LIPITOR) 40 MG TABLET    Take 1 tablet by mouth once daily    BLOOD GLUCOSE MONITORING SUPPL (RELION PRIME MONITOR) DEVICE    by Does not apply route USE TO TEST BLOOD GLUCOSE FOUR TIMES DAILY    CHOLECALCIFEROL (VITAMIN D) 2000 UNITS TABLET    Take 2,000 Units by mouth daily    CONTINUOUS BLOOD GLUC RECEIVER (DEXCOM G6 RECEIVER) DEVICE    1 Device by Does  not apply route daily    CONTINUOUS BLOOD GLUC SENSOR (DEXCOM G6 SENSOR) MISC    1 each by Does not apply route every 10 (ten) days    CONTINUOUS BLOOD GLUC TRANSMIT (DEXCOM G6 TRANSMITTER) MISC    1 Device by Does not apply route daily    ERGOCALCIFEROL (ERGOCALCIFEROL) 1.25 MG (50000 UT) CAPSULE    Take 1 capsule (50,000 Units total) by mouth once a week    FUROSEMIDE (LASIX) 20 MG TABLET    Take 20 mg by mouth daily       GABAPENTIN (NEURONTIN) 300 MG CAPSULE    TAKE 1 CAPSULE BY MOUTH THREE TIMES DAILY    GLUCAGON (BAQSIMI TWO PACK) 3 MG/DOSE POWDER    1 each by Nasal route as needed (severe HYPOglycemia)    GLUCOSE BLOOD (RELION PRIME TEST) TEST STRIP    1 each by Other route 4 (four) times daily Use as instructed    INSULIN GLARGINE (BASAGLAR KWIKPEN) 100 UNIT/ML INJECTION PEN    Inject 62 Units into the skin nightly    INSULIN PEN NEEDLE 31G X 6 MM MISC    by Does not apply  route.Use up to 7 times a day as directed    LIRAGLUTIDE (VICTOZA) 18 MG/3ML INJECTION    INJECT 1.8MG  SUBCUTANEOUSLY ONCE DAILY    LISINOPRIL (PRINIVIL,ZESTRIL) 20 MG TABLET    TAKE 2 TABLETS BY MOUTH NIGHTLY    NOVOLOG FLEXPEN 100 UNIT/ML INJECTION PEN    INJECT SUBCUTANEOUSLY PRIOR TO MEALS PER CORRECTION SCALE UP TO 20 UNITS DAILY.        Past Medical History     Pt has a past medical history of Complication of anesthesia, Diabetes mellitus, Diabetic ulcer of left foot (12/26/14), Disc, Gastroesophageal reflux disease, Hyperlipidemia, Hypertension, Seasonal allergic rhinitis, Sleep apnea, and Type 2 diabetes mellitus, controlled.     Past Surgical History     Pt  has a past surgical history that includes Hand surgery; DEBRIDEMENT & IRRIGATION, LOWER EXTREMITY (Left, 03/12/2015); ARTHRODESIS, TOE (Left, 05/04/2015); and DEBRIDEMENT & IRRIGATION, LOWER EXTREMITY (Left, 03/07/2016).     Family History     The family history includes COPD in his sister; Cancer in his mother and sister; Depression in his sister; Diabetes in his mother;  Hypertension in his brother; Lung cancer in his mother.     Social History     Pt reports that he quit smoking about 19 months ago. His smoking use included cigarettes. He smoked 0.25 packs per day. He has never used smokeless tobacco. He reports that he does not drink alcohol or use drugs.     Physical Exam     Blood pressure 146/74, pulse 90, temperature 99 F (37.2 C), temperature source Oral, resp. rate 12, height 1.727 m (5\' 8" ), weight 112.9 kg (248 lb 12.8 oz), SpO2 98 %.    Physical Exam   Constitutional: He appears well-developed and well-nourished. No distress.   Cardiovascular: Normal rate, regular rhythm and normal heart sounds. Exam reveals no gallop and no friction rub.   No murmur heard.  Pulmonary/Chest: Effort normal and breath sounds normal. No respiratory distress. He has no wheezes. He has no rales.   Musculoskeletal: Normal range of motion.   Neurological: He is alert.   Skin: No rash noted. He is not diaphoretic.   Nursing note and vitals reviewed.        Diagnostic Results     The results of the diagnostic studies below have been reviewed by myself:    [facility] Outpatient Visit on 03/20/2019   Component Date Value Ref Range Status    Sodium 03/20/2019 140  136 - 147 mMol/L Final    Potassium 03/20/2019 4.5  3.5 - 5.3 mMol/L Final    Chloride 03/20/2019 107  98 - 110 mMol/L Final    CO2 03/20/2019 25.2  20.0 - 30.0 mMol/L Final    Calcium 03/20/2019 8.3* 8.5 - 10.5 mg/dL Final    Glucose 03/20/2019 104* 71 - 99 mg/dL Final    Creatinine 03/20/2019 1.47* 0.80 - 1.30 mg/dL Final    BUN 03/20/2019 30* 7 - 22 mg/dL Final    Anion Gap 03/20/2019 12.3  7.0 - 18.0 mMol/L Final    BUN / Creatinine Ratio 03/20/2019 20.4  10.0 - 30.0 Ratio Final    EGFR 03/20/2019 53* 60 - 150 mL/min/1.14m2 Final     eGFR determined using CKD-EPI equation    Osmolality Calculated 03/20/2019 286  275 - 300 mOsm/kg Final    Comment: The above 12 analytes were performed by [facility] [phone]  [address],[place] [address]  Radiologic Studies  No results found.     EKG:      Assessment     Current Diagnoses  1. Essential hypertension    2. Mixed hyperlipidemia    3. Type 2 diabetes mellitus with hyperglycemia, with long-term current use of insulin    4. Diabetic nephropathy associated with type 2 diabetes mellitus            Plan     Continue meds continue see endocrinology follow-up 6 months    Current Diagnoses  1. Essential hypertension    2. Mixed hyperlipidemia    3. Type 2 diabetes mellitus with hyperglycemia, with long-term current use of insulin    4. Diabetic nephropathy associated with type 2 diabetes mellitus          Prescriptions for Discharged Patients  New Prescriptions    No medications on file

## 2019-05-12 ENCOUNTER — Emergency Department: Payer: BC Managed Care – PPO

## 2019-05-12 ENCOUNTER — Emergency Department
Admission: EM | Admit: 2019-05-12 | Discharge: 2019-05-12 | Disposition: A | Discharge status: Home / Self Care | Payer: BC Managed Care – PPO | Attending: Nurse Practitioner | Admitting: Nurse Practitioner

## 2019-05-12 DIAGNOSIS — R079 Chest pain, unspecified: Secondary | ICD-10-CM | POA: Insufficient documentation

## 2019-05-12 DIAGNOSIS — K8020 Calculus of gallbladder without cholecystitis without obstruction: Secondary | ICD-10-CM | POA: Insufficient documentation

## 2019-05-12 DIAGNOSIS — R1013 Epigastric pain: Secondary | ICD-10-CM

## 2019-05-12 LAB — LIPASE: Lipase: 54 U/L (ref 8–78)

## 2019-05-12 LAB — COMPREHENSIVE METABOLIC PANEL
ALT: 27 U/L (ref 0–55)
AST (SGOT): 14 U/L (ref 10–42)
Albumin/Globulin Ratio: 0.63 Ratio — ABNORMAL LOW (ref 0.80–2.00)
Albumin: 2.4 gm/dL — ABNORMAL LOW (ref 3.5–5.0)
Alkaline Phosphatase: 133 U/L (ref 40–145)
Anion Gap: 13.3 mMol/L (ref 7.0–18.0)
BUN / Creatinine Ratio: 13 Ratio (ref 10.0–30.0)
BUN: 20 mg/dL (ref 7–22)
Bilirubin, Total: 0.3 mg/dL (ref 0.1–1.2)
CO2: 27.8 mMol/L (ref 20.0–30.0)
Calcium: 8.3 mg/dL — ABNORMAL LOW (ref 8.5–10.5)
Chloride: 105 mMol/L (ref 98–110)
Creatinine: 1.54 mg/dL — ABNORMAL HIGH (ref 0.80–1.30)
EGFR: 50 mL/min/{1.73_m2} — ABNORMAL LOW (ref 60–150)
Globulin: 3.8 gm/dL (ref 2.0–4.0)
Glucose: 169 mg/dL — ABNORMAL HIGH (ref 71–99)
Osmolality Calculated: 290 mOsm/kg (ref 275–300)
Potassium: 4.1 mMol/L (ref 3.5–5.3)
Protein, Total: 6.2 gm/dL (ref 6.0–8.3)
Sodium: 142 mMol/L (ref 136–147)

## 2019-05-12 LAB — CBC AND DIFFERENTIAL
Basophils %: 0.6 % (ref 0.0–3.0)
Basophils Absolute: 0.1 10*3/uL (ref 0.0–0.3)
Eosinophils %: 1.8 % (ref 0.0–7.0)
Eosinophils Absolute: 0.2 10*3/uL (ref 0.0–0.8)
Hematocrit: 40.1 % (ref 39.0–52.5)
Hemoglobin: 13.2 gm/dL (ref 13.0–17.5)
Lymphocytes Absolute: 2 10*3/uL (ref 0.6–5.1)
Lymphocytes: 18.7 % (ref 15.0–46.0)
MCH: 30 pg (ref 28–35)
MCHC: 33 gm/dL (ref 31–36)
MCV: 90 fL (ref 80–100)
MPV: 8.8 fL (ref 6.0–10.0)
Monocytes Absolute: 0.6 10*3/uL (ref 0.1–1.7)
Monocytes: 5.8 % (ref 3.0–15.0)
Neutrophils %: 73.2 % (ref 42.0–78.0)
Neutrophils Absolute: 7.9 10*3/uL (ref 1.7–8.6)
PLT CT: 259 10*3/uL (ref 130–440)
RBC: 4.46 10*6/uL (ref 4.00–5.70)
RDW: 12.1 % (ref 10.5–14.5)
WBC: 10.7 10*3/uL (ref 4.0–11.0)

## 2019-05-12 LAB — TROPONIN I
Troponin I: 0.03 ng/mL — ABNORMAL HIGH (ref 0.00–0.02)
Troponin I: 0.03 ng/mL — ABNORMAL HIGH (ref 0.00–0.02)

## 2019-05-12 LAB — VH DEXTROSE STICK GLUCOSE: Glucose POCT: 139 mg/dL — ABNORMAL HIGH (ref 71–99)

## 2019-05-12 MED ORDER — ONDANSETRON HCL 4 MG/2ML IJ SOLN
INTRAMUSCULAR | Status: AC
Start: 2019-05-12 — End: ?
  Filled 2019-05-12: qty 2

## 2019-05-12 MED ORDER — DICYCLOMINE HCL 10 MG/5ML PO SOLN
ORAL | Status: AC
Start: 2019-05-12 — End: ?
  Filled 2019-05-12: qty 5

## 2019-05-12 MED ORDER — SODIUM CHLORIDE 0.9 % IV BOLUS
1000.00 mL | Freq: Once | INTRAVENOUS | Status: AC
Start: 2019-05-12 — End: 2019-05-12
  Administered 2019-05-12: 19:00:00 1000 mL via INTRAVENOUS

## 2019-05-12 MED ORDER — ALUM & MAG HYDROXIDE-SIMETH 200-200-20 MG/5ML PO SUSP
Freq: Once | ORAL | Status: AC
Start: 2019-05-12 — End: 2019-05-12

## 2019-05-12 MED ORDER — VH HYDROMORPHONE HCL PF 1 MG/ML CARPUJECT
0.50 mg | Freq: Once | INTRAMUSCULAR | Status: AC
Start: 2019-05-12 — End: 2019-05-12
  Administered 2019-05-12: 19:00:00 0.5 mg via INTRAVENOUS

## 2019-05-12 MED ORDER — IOHEXOL 350 MG/ML IV SOLN
100.00 mL | Freq: Once | INTRAVENOUS | Status: AC | PRN
Start: 2019-05-12 — End: 2019-05-12
  Administered 2019-05-12: 100 mL via INTRAVENOUS

## 2019-05-12 MED ORDER — ONDANSETRON HCL 4 MG/2ML IJ SOLN
4.00 mg | Freq: Once | INTRAMUSCULAR | Status: AC
Start: 2019-05-12 — End: 2019-05-12
  Administered 2019-05-12: 19:00:00 4 mg via INTRAVENOUS

## 2019-05-12 MED ORDER — FAMOTIDINE 20 MG/2ML IV SOLN
INTRAVENOUS | Status: AC
Start: 2019-05-12 — End: ?
  Filled 2019-05-12: qty 2

## 2019-05-12 MED ORDER — FAMOTIDINE 20 MG/2ML IV SOLN
20.00 mg | Freq: Once | INTRAVENOUS | Status: AC
Start: 2019-05-12 — End: 2019-05-12
  Administered 2019-05-12: 19:00:00 20 mg via INTRAVENOUS

## 2019-05-12 MED ORDER — DEXTROSE 50 % IV SOLN
25.00 mL | Freq: Once | INTRAVENOUS | Status: DC
Start: 2019-05-12 — End: 2019-05-12

## 2019-05-12 MED ORDER — VH HYDROMORPHONE HCL PF 1 MG/ML CARPUJECT
INTRAMUSCULAR | Status: AC
Start: 2019-05-12 — End: ?
  Filled 2019-05-12: qty 1

## 2019-05-12 MED ORDER — ONDANSETRON HCL 8 MG PO TABS
8.0000 mg | ORAL_TABLET | Freq: Four times a day (QID) | ORAL | 0 refills | Status: DC | PRN
Start: 2019-05-12 — End: 2020-09-11

## 2019-05-12 MED ORDER — ALUM & MAG HYDROXIDE-SIMETH 200-200-20 MG/5ML PO SUSP
ORAL | Status: AC
Start: 2019-05-12 — End: ?
  Filled 2019-05-12: qty 30

## 2019-05-12 MED ORDER — LIDOCAINE VISCOUS HCL 2 % MT SOLN
OROMUCOSAL | Status: AC
Start: 2019-05-12 — End: ?
  Filled 2019-05-12: qty 20

## 2019-05-12 NOTE — Discharge Instructions (Signed)
Epigastric Pain (Uncertain Cause)  Epigastric pain is pain in the upper abdomen. It can be a sign of disease. Common causes include:   · Acid reflux (stomach acid flowing up into the esophagus)  · Gastritis (irritation of the stomach lining) Most often this is from aspirin or NSAID medicines such as ibuprofen, bacteria called H. pylori, or frequent alcohol use.  · Peptic ulcer disease  · Inflammation of the pancreas  · Gallstone  · Infection in the gallbladder  Pain may be dull or burning. It may spread upward to the chest or to the back. There may be other symptoms such as belching, bloating, cramps or hunger pains. There may be weight loss or poor appetite, nausea or vomiting.   Since the cause of your pain is not certain yet, you may need more tests. Sometimes the doctor will treat you for the most likely condition to see if there is improvement before doing more tests.     Home care  Medicines  · Antacids help neutralize the normal acids in your stomach. If you don’t like the liquid, you can try a chewable one. You may find one works better than another for you. Overuse can cause diarrhea or constipation. Call your provider if you have questions about your medicines or concerns about side effects.  · Acid blockers (H2 blockers) decrease acid production. Examples are cimetidine and famotidine.  · Acid inhibitors (PPIs) decrease acid production in a different way than the blockers. You may find they work better, but can take a little longer to take effect.  Examples are omeprazole, lansoprazole, pantoprazole, rabeprazole, and esomeprazole. Many of these are available over-the-counter or available as generics.  · Take an antacid 30 to 60 minutes after eating and at bedtime, but not at the same time as an acid blocker.  · Try not to take NSAIDs such as ibuprofen. Aspirin may also cause problems, but if taking it for your heart or other medical reasons, talk to your doctor before stopping it.  Diet  · If certain  foods seem to cause your pain, try not to eat them. Certain foods can worsen symptoms of gastritis. Limit or avoid fatty, fried, and spicy foods, as well as coffee, chocolate, mint, and foods with high acid content such as tomatoes and citrus fruit and juices (orange, grapefruit, lemon).  · Eat slowly and chew food well before swallowing. Symptoms of gastritis can be worsened by certain foods.  · Don't drink alcohol. It can irritate the stomach. If you have trouble giving up alcohol, ask your doctor for treatment resources.  · Don't consume caffeine, or use tobacco. These can delay healing and worsen your problem.  · Try eating smaller meals with snacks in between. Don't eat large meals before bedtime.  · Keep an empty stomach for 2 to 3 hours before lying down.  · Prop the head of the bed up if you have overnight symptoms. This helps acid clear from your esophagus.    Follow-up care  Follow up with your healthcare provider or as advised.  When to seek medical advice  Call your healthcare provider right away if any of the following occur:  · Stomach pain worsens or moves to the right lower part of the abdomen  · Chest pain appears, or if it worsens or spreads to the chest, back, neck, shoulder, or arm  · Frequent vomiting (can’t keep down liquids)  · Blood in the stool or vomit (red or black color)  · Feeling weak or   dizzy, fainting, or having trouble breathing   Fever of 100.60F (38C) or higher, or as directed by your healthcare provider   Abdominal swelling   Worsening symptoms or new symptoms  [employer]Well last reviewed this educational content on 06/15/2018   2000-2020 [employer]. [address] [place] [address]. All rights reserved. This information is not intended as a substitute for professional medical care. Always follow your healthcare professional's instructions.          Treating Gallstones    The gallbladder is an organ that stores bile. This is a substance that helps with  digestion. Deposits in bile can clump together, creating hard, pebble-like stones. These gallstones may not cause any symptoms. In most cases, gallstones have no symptoms.In some cases, though, they irritate the walls of the gallbladder. Or they can block flow of bile out of the gallbladder. If they fall into the common bile duct, stones can block the flow of bile into the small bowel. This can lead to jaundice, pain, or serious infection. Stones are treated only if you have symptoms. If treatment is needed, your healthcare provider will discuss your choices with you. The most common treatments are listed below.   Medicine  Medicines to dissolve gallstones don't really work well and you may have to be on them for a long time to work. They work best for small stones. But, once the medicine is stopped, the gallstones usually build up again.   ERCP  ERCP (endoscopic retrograde cholangiopancreatography) is an outpatient procedure that needs heavy sedation to remove stones. It uses a thin tube with video and X-rays to locate stones blocking the flow of bile. The stones are then removed. ERCP may be done alone. Or it may be used before surgery is done to remove the gallbladder.   Surgery  A cholecystectomy is an operation to remove the gallbladder and its contents (gallstones). Today, most cholecystectomies are done laparoscopically. This means using several very small abdominal incisions. Cholecystectomy may also be done with the traditional single, larger incision.   Prevent future symptoms  After treatment, follow a low-fat diet as directed. This diet includeslean meats, lean poultry, and fish. Stay away from full-fat dairy products. And limit vegetable oils. Read food labels to be sure theyre low in fat.   StayWell last reviewed this educational content on 07/15/2017   2000-2020 [employer]. [address] [place] [address]. All rights reserved. This information is not intended as a  substitute for professional medical care. Always follow your healthcare professional's instructions.

## 2019-05-12 NOTE — ED Triage Notes (Signed)
Patient arrives via pov with complaint of "sharp" abd pain that radiates up into his chest and back beginning last Thursday.  Patient reports that pain happens after he eats.  Patient has tried Pepto Bismol without relief, and Tums with a little relief.  He had no results with alka seltzer.  Last bowel movement was Saturday and it was normal for him.

## 2019-05-12 NOTE — ED Provider Notes (Signed)
[facility]  History and Physical Exam       Patient Name: [name], [name]  Encounter Date:  05/12/2019  Nurse Practitioner: Rodney Langton, FNP  Attending Physician: Aaron Mose, MD  PCP: Almond Lint., MD  Patient DOB:  1965/03/10  MRN:  [id number]  Room:  Y86/VHQ46-N      History of Presenting Illness     Chief complaint: Abdominal Pain    HPI/ROS given by: Patient    [name] is a 54 y.o. male who presents with sharp burning abdominal pain, radiating into upper chest and back, progressing over the past 2 days, patterned to worsen after eating or drinking anything. Reports some associated nausea and dry heaves.  Reports last BM yesterday, normal.     Denies fever, chills, cough, shortness of breath.       Review of Systems     Review of Systems   Constitutional: Negative for chills, fatigue and fever.   HENT: Negative for congestion, sinus pain and sore throat.    Eyes: Negative for visual disturbance.   Respiratory: Negative for cough, shortness of breath and wheezing.    Cardiovascular: Negative for chest pain.   Gastrointestinal: Positive for abdominal pain (sharp burning centered abdominal pain, shooting up chest, after eating) and nausea. Negative for constipation, diarrhea and vomiting.   Genitourinary: Negative for dysuria.   Musculoskeletal: Positive for back pain (from abdomen into back when eating). Negative for neck pain.   Skin: Negative for rash.   Neurological: Negative for dizziness, numbness and headaches.        Allergies & Medications     Pt is allergic to flu virus vaccine.    Current/Home Medications    ASPIRIN EC 81 MG EC TABLET    Take 1 tablet (81 mg total) by mouth every evening.    ATORVASTATIN (LIPITOR) 40 MG TABLET    Take 1 tablet by mouth once daily    BLOOD GLUCOSE MONITORING SUPPL (RELION PRIME MONITOR) DEVICE    by Does not apply route USE TO TEST BLOOD GLUCOSE FOUR TIMES DAILY    CHOLECALCIFEROL (VITAMIN D) 2000 UNITS TABLET     Take 2,000 Units by mouth daily    CLINDAMYCIN (CLEOCIN) 150 MG CAPSULE    Take 300 mg by mouth 3 (three) times daily    CONTINUOUS BLOOD GLUC RECEIVER (DEXCOM G6 RECEIVER) DEVICE    1 Device by Does not apply route daily    CONTINUOUS BLOOD GLUC SENSOR (DEXCOM G6 SENSOR) MISC    1 each by Does not apply route every 10 (ten) days    CONTINUOUS BLOOD GLUC TRANSMIT (DEXCOM G6 TRANSMITTER) MISC    1 Device by Does not apply route daily    ERGOCALCIFEROL (ERGOCALCIFEROL) 1.25 MG (50000 UT) CAPSULE    Take 1 capsule (50,000 Units total) by mouth once a week    FUROSEMIDE (LASIX) 20 MG TABLET    Take 20 mg by mouth daily       GABAPENTIN (NEURONTIN) 300 MG CAPSULE    TAKE 1 CAPSULE BY MOUTH THREE TIMES DAILY    GLUCAGON (BAQSIMI TWO PACK) 3 MG/DOSE POWDER    1 each by Nasal route as needed (severe HYPOglycemia)    GLUCOSE BLOOD (RELION PRIME TEST) TEST STRIP    1 each by Other route 4 (four) times daily Use as instructed    INSULIN GLARGINE (BASAGLAR KWIKPEN) 100 UNIT/ML INJECTION PEN    Inject 62 Units  into the skin nightly    INSULIN PEN NEEDLE 31G X 6 MM MISC    by Does not apply route.Use up to 7 times a day as directed    LIRAGLUTIDE (VICTOZA) 18 MG/3ML INJECTION    INJECT 1.8MG  SUBCUTANEOUSLY ONCE DAILY    LISINOPRIL (PRINIVIL,ZESTRIL) 20 MG TABLET    TAKE 2 TABLETS BY MOUTH NIGHTLY    NOVOLOG FLEXPEN 100 UNIT/ML INJECTION PEN    INJECT SUBCUTANEOUSLY PRIOR TO MEALS PER CORRECTION SCALE UP TO 20 UNITS DAILY.    TERBINAFINE (LAMISIL) 250 MG TABLET    Take 250 mg by mouth daily        Past Medical History     Pt has a past medical history of Complication of anesthesia, Diabetes mellitus, Diabetic ulcer of left foot (12/26/14), Disc, Gastroesophageal reflux disease, Hyperlipidemia, Hypertension, Seasonal allergic rhinitis, Sleep apnea, and Type 2 diabetes mellitus, controlled.     Past Surgical History     Pt  has a past surgical history that includes Hand surgery; DEBRIDEMENT & IRRIGATION, LOWER EXTREMITY (Left,  03/12/2015); ARTHRODESIS, TOE (Left, 05/04/2015); and DEBRIDEMENT & IRRIGATION, LOWER EXTREMITY (Left, 03/07/2016).     Family History     The family history includes COPD in his sister; Cancer in his mother and sister; Depression in his sister; Diabetes in his mother; Hypertension in his brother; Lung cancer in his mother.     Social History     Pt reports that he quit smoking about 20 months ago. His smoking use included cigarettes. He smoked 0.25 packs per day. He has never used smokeless tobacco. He reports that he does not drink alcohol and does not use drugs.     Physical Exam     Vitals:    05/12/19 2005 05/12/19 2035 05/12/19 2105 05/12/19 2135   BP: 152/79 163/84 151/77 142/71   Pulse: 91 91 94 89   Resp: 12 (!) 9 18 (!) 9   Temp:       SpO2: 98% 97% 95% 94%   Weight:       Height:             Physical Exam   Constitutional: He is oriented to person, place, and time. He appears well-developed and well-nourished. No distress.   Obese, pleasantly interactive   HENT:   Head: Normocephalic and atraumatic.   Mouth/Throat: Oropharynx is clear and moist.   Eyes: Pupils are equal, round, and reactive to light. Conjunctivae and EOM are normal.   Cardiovascular: Normal rate and regular rhythm.   Pulmonary/Chest: Effort normal and breath sounds normal.   Abdominal: He exhibits distension. There is no abdominal tenderness. There is no rebound and no guarding.   Musculoskeletal:         General: No edema. Normal range of motion.      Cervical back: Normal range of motion.   Neurological: He is alert and oriented to person, place, and time.   Skin: Skin is warm. He is not diaphoretic. No erythema.   Psychiatric: He has a normal mood and affect. His behavior is normal. Judgment and thought content normal.   Nursing note and vitals reviewed.         Diagnostic Results     The results of the diagnostic studies below have been reviewed by myself:    Labs  Results     Procedure Component Value Units Date/Time    Troponin I  [[id number]]  (Abnormal) Collected: 05/12/19 2117    Specimen: Plasma  Updated: 05/12/19 2141     Troponin I 0.03 ng/mL     Dextrose Stick Glucose [[id number]]  (Abnormal) Collected: 05/12/19 2021    Specimen: Blood Updated: 05/12/19 2039     Glucose POCT 139 mg/dL     Troponin I [[id number]]  (Abnormal) Collected: 05/12/19 1818    Specimen: Plasma Updated: 05/12/19 1856     Troponin I 0.03 ng/mL     Comprehensive metabolic panel [[id number]]  (Abnormal) Collected: 05/12/19 1818    Specimen: Plasma Updated: 05/12/19 1851     Sodium 142 mMol/L      Potassium 4.1 mMol/L      Chloride 105 mMol/L      CO2 27.8 mMol/L      Calcium 8.3 mg/dL      Glucose 169 mg/dL      Creatinine 1.54 mg/dL      BUN 20 mg/dL      Protein, Total 6.2 gm/dL      Albumin 2.4 gm/dL      Alkaline Phosphatase 133 U/L      ALT 27 U/L      AST (SGOT) 14 U/L      Bilirubin, Total 0.3 mg/dL      Albumin/Globulin Ratio 0.63 Ratio      Anion Gap 13.3 mMol/L      BUN / Creatinine Ratio 13.0 Ratio      EGFR 50 mL/min/1.78m2      Osmolality Calculated 290 mOsm/kg      Globulin 3.8 gm/dL     Lipase [[id number]] Collected: 05/12/19 1818    Specimen: Plasma Updated: 05/12/19 1851     Lipase 54 U/L     CBC and differential [[id number]] Collected: 05/12/19 1818    Specimen: Blood Updated: 05/12/19 1837     WBC 10.7 K/cmm      RBC 4.46 M/cmm      Hemoglobin 13.2 gm/dL      Hematocrit 40.1 %      MCV 90 fL      MCH 30 pg      MCHC 33 gm/dL      RDW 12.1 %      PLT CT 259 K/cmm      MPV 8.8 fL      Neutrophils % 73.2 %      Lymphocytes 18.7 %      Monocytes 5.8 %      Eosinophils % 1.8 %      Basophils % 0.6 %      Neutrophils Absolute 7.9 K/cmm      Lymphocytes Absolute 2.0 K/cmm      Monocytes Absolute 0.6 K/cmm      Eosinophils Absolute 0.2 K/cmm      Basophils Absolute 0.1 K/cmm           Radiologic Studies  CT Abdomen Pelvis with IV Cont    Result Date: 05/12/2019  Cholelithiasis. No free fluid or inflammatory changes are seen in the abdomen or pelvis.  Diverticulosis. There is a hypodense focus in the spleen that is too small to definitely characterize. There are currently no studies available for direct comparison. ReadingStation:[id number]      EKG: @ 1809, Sinus Rhythm, rate 95, without depression, elevation, ectopy.       ED Course and Medical Decision Making     ED Medication Orders (From admission, onward)    Start Ordered     Status Ordering Provider    05/12/19 2019 05/12/19 2018    Once in ED  Route: Intravenous  Ordered Dose: 25 mL     Discontinued PAYNE, MATTHEW P    05/12/19 1947 05/12/19 1946  GI cocktail (dicyclomine/viscous lidocaine/Maalox) suspension 45 mL  Once in ED     Route: Oral     Last MAR action: Given PAYNE, MATTHEW P    05/12/19 1927 05/12/19 1927  iohexol (OMNIPAQUE) 350 MG/ML injection 100 mL  IMG once as needed     Route: Intravenous  Ordered Dose: 100 mL     Last MAR action: Imaging Agent Given Martin Majestic II    05/12/19 1902 05/12/19 1901  sodium chloride 0.9 % bolus 1,000 mL  Once in ED     Route: Intravenous  Ordered Dose: 1,000 mL     Last MAR action: New Bag PAYNE, MATTHEW P    05/12/19 1817 05/12/19 1816  ondansetron (ZOFRAN) injection 4 mg  Once in ED     Route: Intravenous  Ordered Dose: 4 mg     Last MAR action: Given PAYNE, MATTHEW P    05/12/19 1817 05/12/19 1816  famotidine (PEPCID) injection 20 mg  Once in ED     Route: Intravenous  Ordered Dose: 20 mg     Last MAR action: Given PAYNE, MATTHEW P    05/12/19 1817 05/12/19 1816  HYDROmorphone (DILAUDID) injection 0.5 mg  Once in ED     Route: Intravenous  Ordered Dose: 0.5 mg     Last MAR action: Given PAYNE, MATTHEW P                 Heart Score Calculator    0 points 1 point 2 points    History Slightly suspicious Moderately suspicious Highly suspicious 0   EKG Normal Non-specific repolarization disturbance Significant ST depression 0   Age (years) <45 45-64 ?5 +1   Risk factors* No known risk factors 1-2 risk factors ?3 risk factors or history of  atherosclerotic disease +1   Initial troponin ?normal limit 1-3 normal limit >3 normal limit 0      Total Score   2     *Risk factors: HTN, hypercholesterolemia, DM, obesity (BMI >30 kg/m), smoking (current, or smoking cessation ?3 month), positive family history (parent or sibling with CVD before age 22); atherosclerotic disease: prior MI, PCI/CABG, CVA/TIA, or peripheral arterial disease.  General Recommendations   (Some patients may require a more conservative plan of care)   Score 0 - 3:  Discharge and follow up with PCP. (Major Adverse Cardiac Event risk 2.5% over next six weeks)   Score 4 - 6: Observe for 6 hours and consider inpatient or early outpatient stress testing in the [facility]. (Major Adverse Cardiac Event risk 20.3% over next six weeks)   Score 7 - 10: Observe and consider in-hospital stress testing or Cardiology consultation. (Major Adverse Cardiac Event risk 72.7% over next six weeks)  A Major Adverse Cardiac Event was defined as all-cause mortality, myocardial infarction, or coronary revascularization.  In addition to the above history, please see nursing notes. Allergies, meds, past medical, family, social hx, and the results of the diagnostic studies performed have been reviewed by myself.    I discussed this case with the attending physician in the emergency department and they agree with the assessment and treatment plan.     This chart was generated by an EMR and may contain errors or omissions not intended by the user.     Procedures / Critical Care     None  Diagnosis / Disposition     Clinical Impression  1. Epigastric pain    2. Chest pain, unspecified type    3. Calculus of gallbladder without cholecystitis without obstruction        Disposition  ED Disposition     ED Disposition Condition Date/Time Comment    Discharge  Sun May 12, 2019 10:50 PM [name] discharge to home/self care.    Condition at disposition: Stable            Follow up for Discharged  Patients  Almond Lint., MD  [place] [place] [address]  Endocrinology and Internal Medicine  [place] [address]  [phone]          Pulizzi, McLeod, MD  [address]  [place] [address]  [phone]            Prescriptions for Discharged Patients  New Prescriptions    ONDANSETRON (ZOFRAN) 8 MG TABLET    Take 1 tablet (8 mg total) by mouth every 6 (six) hours as needed for Nausea          Signed by:  Rodney Langton, FNP, DNP     Aaron Mose, MD  05/12/19 2251

## 2019-05-13 ENCOUNTER — Encounter (RURAL_HEALTH_CENTER): Payer: Self-pay | Admitting: Family Medicine

## 2019-05-13 ENCOUNTER — Telehealth (RURAL_HEALTH_CENTER): Payer: Self-pay | Admitting: Surgery

## 2019-05-13 ENCOUNTER — Ambulatory Visit: Payer: BC Managed Care – PPO | Attending: Family Medicine | Admitting: Family Medicine

## 2019-05-13 VITALS — BP 155/65 | HR 89 | Resp 16 | Ht 67.0 in | Wt 240.3 lb

## 2019-05-13 DIAGNOSIS — E1165 Type 2 diabetes mellitus with hyperglycemia: Secondary | ICD-10-CM

## 2019-05-13 DIAGNOSIS — I10 Essential (primary) hypertension: Secondary | ICD-10-CM

## 2019-05-13 DIAGNOSIS — K8020 Calculus of gallbladder without cholecystitis without obstruction: Secondary | ICD-10-CM

## 2019-05-13 DIAGNOSIS — K219 Gastro-esophageal reflux disease without esophagitis: Secondary | ICD-10-CM

## 2019-05-13 DIAGNOSIS — Z794 Long term (current) use of insulin: Secondary | ICD-10-CM

## 2019-05-13 DIAGNOSIS — Z1211 Encounter for screening for malignant neoplasm of colon: Secondary | ICD-10-CM

## 2019-05-13 LAB — ECG 12-LEAD
P Wave Axis: 2 deg
P-R Interval: 169 ms
Patient Age: 54 years
Q-T Interval(Corrected): 468 ms
Q-T Interval: 372 ms
QRS Axis: -10 deg
QRS Duration: 95 ms
T Axis: 55 years
Ventricular Rate: 95 //min

## 2019-05-13 MED ORDER — PANTOPRAZOLE SODIUM 40 MG PO TBEC
40.00 mg | DELAYED_RELEASE_TABLET | Freq: Every day | ORAL | 0 refills | Status: DC
Start: ? — End: 2019-05-13

## 2019-05-13 NOTE — Progress Notes (Signed)
History and Physical Exam       Patient Name: [name], [name]  Encounter Date:  05/13/2019   Attending Physician: Marijo Sanes, MD  PCP: Almond Lint., MD  Patient DOB:  1965/08/27  MRN:  [id number]      History of Presenting Illness     Chief complaint: Hypertension and ER Follow-up (possible ulcers / gallstones.burning stomach feeling started last thrusday has eatten since last thrusday no bowel movement since saturday.)    HPI/ROS given by: Patient    [name] is a 54 y.o. male who presents with an ER follow-up from yesterday for chest and abdominal pain reviewed all testing from this visit CAT scan did show gallstones.  He does not give any acid reducing medication he is still having complaints of nausea and intermittent vomiting with abdominal pain denies any chest pain since yesterday denies any blood in stool diarrhea constipation or decreased urination.  Denies any fever shortness of breath cough.  No previous colonoscopy.  He does not consume alcohol.  He also has a history of hypertension diabetes.         Review of Systems     Review of Systems   Constitutional: Negative for chills and fever.   Respiratory: Negative for cough and shortness of breath.    Cardiovascular: Negative for leg swelling.   Gastrointestinal: Positive for abdominal pain, nausea and vomiting. Negative for blood in stool, constipation and diarrhea.   Genitourinary: Negative for decreased urine volume.   Skin: Negative for color change and rash.   Neurological: Negative for dizziness and syncope.        Allergies & Medications     Pt is allergic to flu virus vaccine.    Current/Home Medications    ASPIRIN EC 81 MG EC TABLET    Take 1 tablet (81 mg total) by mouth every evening.    ATORVASTATIN (LIPITOR) 40 MG TABLET    Take 1 tablet by mouth once daily    BLOOD GLUCOSE MONITORING SUPPL (RELION PRIME MONITOR) DEVICE    by Does not apply route USE TO TEST BLOOD GLUCOSE FOUR TIMES DAILY    CHOLECALCIFEROL (VITAMIN D) 2000  UNITS TABLET    Take 2,000 Units by mouth daily    CLINDAMYCIN (CLEOCIN) 150 MG CAPSULE    Take 300 mg by mouth 3 (three) times daily    CONTINUOUS BLOOD GLUC RECEIVER (DEXCOM G6 RECEIVER) DEVICE    1 Device by Does not apply route daily    CONTINUOUS BLOOD GLUC SENSOR (DEXCOM G6 SENSOR) MISC    1 each by Does not apply route every 10 (ten) days    CONTINUOUS BLOOD GLUC TRANSMIT (DEXCOM G6 TRANSMITTER) MISC    1 Device by Does not apply route daily    ERGOCALCIFEROL (ERGOCALCIFEROL) 1.25 MG (50000 UT) CAPSULE    Take 1 capsule (50,000 Units total) by mouth once a week    FUROSEMIDE (LASIX) 20 MG TABLET    Take 20 mg by mouth daily       GABAPENTIN (NEURONTIN) 300 MG CAPSULE    TAKE 1 CAPSULE BY MOUTH THREE TIMES DAILY    GLUCAGON (BAQSIMI TWO PACK) 3 MG/DOSE POWDER    1 each by Nasal route as needed (severe HYPOglycemia)    GLUCOSE BLOOD (RELION PRIME TEST) TEST STRIP    1 each by Other route 4 (four) times daily Use as instructed    INSULIN GLARGINE (BASAGLAR KWIKPEN) 100 UNIT/ML INJECTION PEN    Inject  62 Units into the skin nightly    INSULIN PEN NEEDLE 31G X 6 MM MISC    by Does not apply route.Use up to 7 times a day as directed    LIRAGLUTIDE (VICTOZA) 18 MG/3ML INJECTION    INJECT 1.8MG  SUBCUTANEOUSLY ONCE DAILY    LISINOPRIL (PRINIVIL,ZESTRIL) 20 MG TABLET    TAKE 2 TABLETS BY MOUTH NIGHTLY    NOVOLOG FLEXPEN 100 UNIT/ML INJECTION PEN    INJECT SUBCUTANEOUSLY PRIOR TO MEALS PER CORRECTION SCALE UP TO 20 UNITS DAILY.    ONDANSETRON (ZOFRAN) 8 MG TABLET    Take 1 tablet (8 mg total) by mouth every 6 (six) hours as needed for Nausea    TERBINAFINE (LAMISIL) 250 MG TABLET    Take 250 mg by mouth daily        Past Medical History     Pt has a past medical history of Complication of anesthesia, Diabetes mellitus, Diabetic ulcer of left foot (12/26/14), Disc, Gastroesophageal reflux disease, Hyperlipidemia, Hypertension, Seasonal allergic rhinitis, Sleep apnea, and Type 2 diabetes mellitus, controlled.     Past  Surgical History     Pt  has a past surgical history that includes Hand surgery; DEBRIDEMENT & IRRIGATION, LOWER EXTREMITY (Left, 03/12/2015); ARTHRODESIS, TOE (Left, 05/04/2015); and DEBRIDEMENT & IRRIGATION, LOWER EXTREMITY (Left, 03/07/2016).     Family History     The family history includes COPD in his sister; Cancer in his mother and sister; Depression in his sister; Diabetes in his mother; Hypertension in his brother; Lung cancer in his mother.     Social History     Pt reports that he quit smoking about 20 months ago. His smoking use included cigarettes. He smoked 0.25 packs per day. He has never used smokeless tobacco. He reports that he does not drink alcohol and does not use drugs.     Physical Exam     Blood pressure 155/65, pulse 89, resp. rate 16, height 1.702 m (5\' 7" ), weight 109 kg (240 lb 4.8 oz), SpO2 98 %.    Physical Exam   Constitutional: He appears well-developed and well-nourished. No distress.   Cardiovascular: Normal rate, regular rhythm and normal heart sounds. Exam reveals no gallop and no friction rub.   No murmur heard.  Pulmonary/Chest: Effort normal and breath sounds normal. No respiratory distress. He has no wheezes. He has no rales.   Musculoskeletal:         General: Normal range of motion.   Neurological: He is alert.   Skin: No rash noted. He is not diaphoretic.   Nursing note and vitals reviewed.        Diagnostic Results     The results of the diagnostic studies below have been reviewed by myself:    Labs  Admission on 05/12/2019, Discharged on 05/12/2019   Component Date Value Ref Range Status    Patient Age 25/28/2021 54  years Final    Patient DOB 05/12/2021 12-28-63   Final    Interpretation Text 05/12/2019    Final                    Value:Sinus rhythm  Compared to ECG 03/04/2016 17:02:13  Sinus tachycardia no longer present  Electronically Signed On 05-13-2019 8:50:05 EDT by Wyline Copas      Physician Interpreter 05/12/2019 Wyline Copas   Final    Ventricular Rate  05/12/2019 95  //min Final    QRS Duration 05/12/2019 95  ms Final  P-R Interval 05/12/2019 169  ms Final    Q-T Interval 05/12/2019 372  ms Final    Q-T Interval(Corrected) 05/12/2019 468  ms Final    P Wave Axis 05/12/2019 2  deg Final    QRS Axis 05/12/2019 -10  deg Final    T Axis 05/12/2019 53  years Final    WBC 05/12/2019 10.7  4.0 - 11.0 K/cmm Final    RBC 05/12/2019 4.46  4.00 - 5.70 M/cmm Final    Hemoglobin 05/12/2019 13.2  13.0 - 17.5 gm/dL Final    Hematocrit 05/12/2019 40.1  39.0 - 52.5 % Final    MCV 05/12/2019 90  80 - 100 fL Final    MCH 05/12/2019 30  28 - 35 pg Final    MCHC 05/12/2019 33  31 - 36 gm/dL Final    RDW 05/12/2019 12.1  10.5 - 14.5 % Final    PLT CT 05/12/2019 259  130 - 440 K/cmm Final    MPV 05/12/2019 8.8  6.0 - 10.0 fL Final    Neutrophils % 05/12/2019 73.2  42.0 - 78.0 % Final    Lymphocytes 05/12/2019 18.7  15.0 - 46.0 % Final    Monocytes 05/12/2019 5.8  3.0 - 15.0 % Final    Eosinophils % 05/12/2019 1.8  0.0 - 7.0 % Final    Basophils % 05/12/2019 0.6  0.0 - 3.0 % Final    Neutrophils Absolute 05/12/2019 7.9  1.7 - 8.6 K/cmm Final    Lymphocytes Absolute 05/12/2019 2.0  0.6 - 5.1 K/cmm Final    Monocytes Absolute 05/12/2019 0.6  0.1 - 1.7 K/cmm Final    Eosinophils Absolute 05/12/2019 0.2  0.0 - 0.8 K/cmm Final    Basophils Absolute 05/12/2019 0.1  0.0 - 0.3 K/cmm Final    Comment: The above 20 analytes were performed by [facility] [phone])  [address],[place] [address]      Sodium 05/12/2019 142  136 - 147 mMol/L Final    Potassium 05/12/2019 4.1  3.5 - 5.3 mMol/L Final    Chloride 05/12/2019 105  98 - 110 mMol/L Final    CO2 05/12/2019 27.8  20.0 - 30.0 mMol/L Final    Calcium 05/12/2019 8.3* 8.5 - 10.5 mg/dL Final    Glucose 05/12/2019 169* 71 - 99 mg/dL Final    Creatinine 05/12/2019 1.54* 0.80 - 1.30 mg/dL Final    BUN 05/12/2019 20  7 - 22 mg/dL Final    Protein, Total 05/12/2019 6.2  6.0 - 8.3  gm/dL Final    Albumin 05/12/2019 2.4* 3.5 - 5.0 gm/dL Final    Alkaline Phosphatase 05/12/2019 133  40 - 145 U/L Final    ALT 05/12/2019 27  0 - 55 U/L Final    AST (SGOT) 05/12/2019 14  10 - 42 U/L Final    Bilirubin, Total 05/12/2019 0.3  0.1 - 1.2 mg/dL Final    Albumin/Globulin Ratio 05/12/2019 0.63* 0.80 - 2.00 Ratio Final    Anion Gap 05/12/2019 13.3  7.0 - 18.0 mMol/L Final    BUN / Creatinine Ratio 05/12/2019 13.0  10.0 - 30.0 Ratio Final    EGFR 05/12/2019 50* 60 - 150 mL/min/1.69m2 Final     eGFR determined using CKD-EPI equation    Osmolality Calculated 05/12/2019 290  275 - 300 mOsm/kg Final    Globulin 05/12/2019 3.8  2.0 - 4.0 gm/dL Final    Comment: The above 20 analytes were performed by [facility]  Main Lab [phone])  [address],[place] [address]      Lipase 05/12/2019 54  8 - 78 U/L Final    Comment: The above 1 analytes were performed by [facility] [phone]  [address],[place] [address]      Troponin I 05/12/2019 0.03* 0.00 - 0.02 ng/mL Final    Comment: Troponin Interpretative Comment:    Indeterminate    Levels are above the 99th percentile of the reference range. To determine whether or not this increase is due to acute cardiac damage, it is recommended to perform serial blood draws every 4 to 8 hours. An increase of 0.04 ng/mL or greater indicates acute cardiac damage.    The above 1 analytes were performed by [facility] [phone])  [address],[place] [address]      Troponin I 05/12/2019 0.03* 0.00 - 0.02 ng/mL Final    Comment: Troponin Interpretative Comment:    Indeterminate    Levels are above the 99th percentile of the reference range. To determine whether or not this increase is due to acute cardiac damage, it is recommended to perform serial blood draws every 4 to 8 hours. An increase of 0.04 ng/mL or greater indicates acute cardiac damage.    The above 1 analytes were  performed by [facility] Main Lab ([id number])  [address],[place] [address]      Glucose POCT 05/12/2019 139* 71 - 99 mg/dL Final    Comment: The above 1 analytes were performed by [facility] [phone] [place] [id number]          Radiologic Studies  CT Abdomen Pelvis with IV Cont    Result Date: 05/12/2019  Cholelithiasis. No free fluid or inflammatory changes are seen in the abdomen or pelvis. Diverticulosis. There is a hypodense focus in the spleen that is too small to definitely characterize. There are currently no studies available for direct comparison. ReadingStation:[id number]       EKG:      Assessment     Current Diagnoses  1. Gallstones    2. Gastroesophageal reflux disease without esophagitis    3. Screening for colon cancer    4. Essential hypertension    5. Type 2 diabetes mellitus with hyperglycemia, with long-term current use of insulin            Plan     Trial of PPI refer to general surgery follow-up as needed or next visit    Current Diagnoses  1. Gallstones    2. Gastroesophageal reflux disease without esophagitis    3. Screening for colon cancer    4. Essential hypertension    5. Type 2 diabetes mellitus with hyperglycemia, with long-term current use of insulin          Prescriptions for Discharged Patients  New Prescriptions    PANTOPRAZOLE (PROTONIX) 40 MG TABLET    Take 1 tablet (40 mg total) by mouth daily

## 2019-05-13 NOTE — Telephone Encounter (Signed)
Called and left voice message for pt to return call to complete colonoscopy questionnaire with this nurse navigator.    Cain Sieve, RN, MSN, OCN  Nurse Navigator'  [phone]

## 2019-05-14 NOTE — Progress Notes (Signed)
Pt doesn't have insur coverage for diab ed ---only preventative services (thru [employer])

## 2019-05-16 ENCOUNTER — Telehealth (RURAL_HEALTH_CENTER): Payer: Self-pay | Admitting: Surgery

## 2019-05-16 ENCOUNTER — Telehealth: Payer: Self-pay

## 2019-05-16 ENCOUNTER — Encounter: Payer: Self-pay | Admitting: Podiatric Surgery

## 2019-05-16 NOTE — Telephone Encounter (Signed)
Completed colonoscopy questionnaire with pt over the telephone and sent it to the General Surgery clinic at [facility] via secure e-mail.    Cain Sieve, RN, MSN, OCN  Nurse Navigator  [phone]

## 2019-05-17 ENCOUNTER — Encounter (INDEPENDENT_AMBULATORY_CARE_PROVIDER_SITE_OTHER): Payer: Self-pay

## 2019-05-21 ENCOUNTER — Other Ambulatory Visit (RURAL_HEALTH_CENTER): Payer: Self-pay | Admitting: Family

## 2019-05-21 ENCOUNTER — Ambulatory Visit: Payer: BC Managed Care – PPO

## 2019-05-21 ENCOUNTER — Encounter (RURAL_HEALTH_CENTER): Payer: Self-pay

## 2019-05-21 ENCOUNTER — Other Ambulatory Visit
Admission: RE | Admit: 2019-05-21 | Discharge: 2019-05-21 | Disposition: A | Discharge status: Home / Self Care | Payer: BC Managed Care – PPO | Source: Ambulatory Visit | Attending: Podiatric Surgery | Admitting: Podiatric Surgery

## 2019-05-21 DIAGNOSIS — Z1152 Encounter for screening for COVID-19: Secondary | ICD-10-CM

## 2019-05-21 DIAGNOSIS — Z794 Long term (current) use of insulin: Secondary | ICD-10-CM

## 2019-05-21 DIAGNOSIS — E1165 Type 2 diabetes mellitus with hyperglycemia: Secondary | ICD-10-CM

## 2019-05-22 LAB — VH APTIMA SARS-COV-2 ASSAY (PANTHER SYSTEM)(TM)
Aptima SARS-CoV-2: NEGATIVE
Does patient have symptoms related to condition of interest?: NEGATIVE
Does patient reside in a congregate care setting?: NEGATIVE
Is patient admitted to the intensive care unit?: NEGATIVE
Is patient employed in a healthcare setting?: NEGATIVE
Is the patient hospitalized because of this condition?: NEGATIVE
Is the patient pregnant?: NEGATIVE

## 2019-05-23 ENCOUNTER — Ambulatory Visit
Admission: RE | Admit: 2019-05-23 | Discharge: 2019-05-23 | Disposition: A | Discharge status: Home / Self Care | Payer: BC Managed Care – PPO | Source: Ambulatory Visit | Attending: Family | Admitting: Family

## 2019-05-23 ENCOUNTER — Telehealth (RURAL_HEALTH_CENTER): Payer: Self-pay

## 2019-05-23 ENCOUNTER — Other Ambulatory Visit (RURAL_HEALTH_CENTER): Payer: Self-pay

## 2019-05-23 DIAGNOSIS — E1165 Type 2 diabetes mellitus with hyperglycemia: Secondary | ICD-10-CM | POA: Insufficient documentation

## 2019-05-23 DIAGNOSIS — Z5181 Encounter for therapeutic drug level monitoring: Secondary | ICD-10-CM | POA: Insufficient documentation

## 2019-05-23 DIAGNOSIS — I10 Essential (primary) hypertension: Secondary | ICD-10-CM

## 2019-05-23 DIAGNOSIS — B351 Tinea unguium: Secondary | ICD-10-CM | POA: Insufficient documentation

## 2019-05-23 DIAGNOSIS — Z794 Long term (current) use of insulin: Secondary | ICD-10-CM

## 2019-05-23 DIAGNOSIS — R7989 Other specified abnormal findings of blood chemistry: Secondary | ICD-10-CM | POA: Insufficient documentation

## 2019-05-23 LAB — HEPATIC FUNCTION PANEL
ALT: 22 U/L (ref 0–55)
AST (SGOT): 22 U/L (ref 10–42)
Albumin/Globulin Ratio: 0.59 Ratio — ABNORMAL LOW (ref 0.80–2.00)
Albumin: 2.2 gm/dL — ABNORMAL LOW (ref 3.5–5.0)
Alkaline Phosphatase: 134 U/L (ref 40–145)
Bilirubin Direct: <0.1 mg/dL (ref 0.0–0.3)
Bilirubin, Total: <0.1 mg/dL — ABNORMAL LOW (ref 0.1–1.2)
Globulin: 3.7 gm/dL (ref 2.0–4.0)
Protein, Total: 5.9 gm/dL — ABNORMAL LOW (ref 6.0–8.3)

## 2019-05-23 LAB — BASIC METABOLIC PANEL
Anion Gap: 14.2 mMol/L (ref 7.0–18.0)
BUN / Creatinine Ratio: 18.9 Ratio (ref 10.0–30.0)
BUN: 34 mg/dL — ABNORMAL HIGH (ref 7–22)
CO2: 20.8 mMol/L (ref 20.0–30.0)
Calcium: 8 mg/dL — ABNORMAL LOW (ref 8.5–10.5)
Chloride: 111 mMol/L — ABNORMAL HIGH (ref 98–110)
Creatinine: 1.8 mg/dL — ABNORMAL HIGH (ref 0.80–1.30)
EGFR: 41 mL/min/{1.73_m2} — ABNORMAL LOW (ref 60–150)
Glucose: 96 mg/dL (ref 71–99)
Osmolality Calculated: 289 mOsm/kg (ref 275–300)
Potassium: 5 mMol/L (ref 3.5–5.3)
Sodium: 141 mMol/L (ref 136–147)

## 2019-05-23 MED ORDER — LISINOPRIL 20 MG PO TABS
40.0000 mg | ORAL_TABLET | Freq: Every evening | ORAL | 0 refills | Status: DC
Start: ? — End: 2019-05-23

## 2019-05-23 MED ORDER — GABAPENTIN 300 MG PO CAPS
300.00 mg | ORAL_CAPSULE | Freq: Three times a day (TID) | ORAL | 0 refills | Status: DC
Start: ? — End: 2019-05-23

## 2019-05-23 NOTE — Telephone Encounter (Signed)
Received a voicemail from patient requesting medication refills, attempted to call patient back to get more information on which meds need to be refilled but was unsuccessful. [email or web address]

## 2019-05-24 ENCOUNTER — Ambulatory Visit: Payer: BC Managed Care – PPO | Admitting: Anesthesiology

## 2019-05-24 ENCOUNTER — Ambulatory Visit: Payer: BC Managed Care – PPO

## 2019-05-24 ENCOUNTER — Encounter: Payer: Self-pay | Admitting: Podiatric Surgery

## 2019-05-24 ENCOUNTER — Encounter
Admission: RE | Disposition: A | Discharge status: Home / Self Care | Payer: Self-pay | Source: Ambulatory Visit | Attending: Podiatric Surgery

## 2019-05-24 ENCOUNTER — Ambulatory Visit
Admission: RE | Admit: 2019-05-24 | Discharge: 2019-05-24 | Disposition: A | Discharge status: Home / Self Care | Payer: BC Managed Care – PPO | Source: Ambulatory Visit | Attending: Podiatric Surgery | Admitting: Podiatric Surgery

## 2019-05-24 DIAGNOSIS — Z794 Long term (current) use of insulin: Secondary | ICD-10-CM | POA: Insufficient documentation

## 2019-05-24 DIAGNOSIS — L97529 Non-pressure chronic ulcer of other part of left foot with unspecified severity: Secondary | ICD-10-CM | POA: Insufficient documentation

## 2019-05-24 DIAGNOSIS — Z8631 Personal history of diabetic foot ulcer: Secondary | ICD-10-CM | POA: Insufficient documentation

## 2019-05-24 DIAGNOSIS — Z87892 Personal history of anaphylaxis: Secondary | ICD-10-CM | POA: Insufficient documentation

## 2019-05-24 DIAGNOSIS — Z887 Allergy status to serum and vaccine status: Secondary | ICD-10-CM | POA: Insufficient documentation

## 2019-05-24 DIAGNOSIS — N186 End stage renal disease: Secondary | ICD-10-CM | POA: Insufficient documentation

## 2019-05-24 DIAGNOSIS — Z7982 Long term (current) use of aspirin: Secondary | ICD-10-CM | POA: Insufficient documentation

## 2019-05-24 DIAGNOSIS — Z981 Arthrodesis status: Secondary | ICD-10-CM | POA: Insufficient documentation

## 2019-05-24 DIAGNOSIS — K219 Gastro-esophageal reflux disease without esophagitis: Secondary | ICD-10-CM | POA: Insufficient documentation

## 2019-05-24 DIAGNOSIS — E1122 Type 2 diabetes mellitus with diabetic chronic kidney disease: Secondary | ICD-10-CM | POA: Insufficient documentation

## 2019-05-24 DIAGNOSIS — M7752 Other enthesopathy of left foot: Secondary | ICD-10-CM | POA: Insufficient documentation

## 2019-05-24 DIAGNOSIS — G4730 Sleep apnea, unspecified: Secondary | ICD-10-CM | POA: Insufficient documentation

## 2019-05-24 DIAGNOSIS — I120 Hypertensive chronic kidney disease with stage 5 chronic kidney disease or end stage renal disease: Secondary | ICD-10-CM | POA: Insufficient documentation

## 2019-05-24 DIAGNOSIS — E11621 Type 2 diabetes mellitus with foot ulcer: Secondary | ICD-10-CM | POA: Insufficient documentation

## 2019-05-24 DIAGNOSIS — Z79899 Other long term (current) drug therapy: Secondary | ICD-10-CM | POA: Insufficient documentation

## 2019-05-24 DIAGNOSIS — E785 Hyperlipidemia, unspecified: Secondary | ICD-10-CM | POA: Insufficient documentation

## 2019-05-24 HISTORY — PX: SESAMOIDECTOMY: SHX510127

## 2019-05-24 HISTORY — DX: Unspecified visual disturbance: H53.9

## 2019-05-24 LAB — VH DEXTROSE STICK GLUCOSE: Glucose POCT: 179 mg/dL — ABNORMAL HIGH (ref 71–99)

## 2019-05-24 SURGERY — SESAMOIDECTOMY
Anesthesia: Anesthesia MAC / Sedation | Site: Foot | Laterality: Left | Wound class: Dirty or Infected

## 2019-05-24 MED ORDER — LIDOCAINE HCL (PF) 2 % IJ SOLN
INTRAMUSCULAR | Status: DC | PRN
Start: 2019-05-24 — End: 2019-05-24
  Administered 2019-05-24: 60 mg via INTRAVENOUS

## 2019-05-24 MED ORDER — DEXAMETHASONE SODIUM PHOSPHATE 4 MG/ML IJ SOLN (WRAP)
INTRAMUSCULAR | Status: DC | PRN
Start: 2019-05-24 — End: 2019-05-24
  Administered 2019-05-24: 4 mg via INTRAVENOUS

## 2019-05-24 MED ORDER — VH PROPOFOL INFUSION 10 MG/ML (WRAPPED)
INTRAVENOUS | Status: DC | PRN
Start: 2019-05-24 — End: 2019-05-24
  Administered 2019-05-24: 50 ug/kg/min via INTRAVENOUS

## 2019-05-24 MED ORDER — PROPOFOL 200 MG/20ML IV EMUL
INTRAVENOUS | Status: AC
Start: 2019-05-24 — End: ?
  Filled 2019-05-24: qty 20

## 2019-05-24 MED ORDER — LIDOCAINE HCL (PF) 2 % IJ SOLN
INTRAMUSCULAR | Status: DC | PRN
Start: 2019-05-24 — End: 2019-05-24
  Administered 2019-05-24: 10:00:00 24 mL via INTRAMUSCULAR

## 2019-05-24 MED ORDER — LIDOCAINE HCL (PF) 2 % IJ SOLN
INTRAMUSCULAR | Status: AC
Start: 2019-05-24 — End: ?
  Filled 2019-05-24: qty 5

## 2019-05-24 MED ORDER — PROPOFOL 200 MG/20ML IV EMUL
INTRAVENOUS | Status: DC | PRN
Start: 2019-05-24 — End: 2019-05-24
  Administered 2019-05-24 (×2): 50 mg via INTRAVENOUS
  Administered 2019-05-24: 40 mg via INTRAVENOUS
  Administered 2019-05-24: 50 mg via INTRAVENOUS
  Administered 2019-05-24: 30 mg via INTRAVENOUS
  Administered 2019-05-24: 50 mg via INTRAVENOUS
  Administered 2019-05-24 (×2): 30 mg via INTRAVENOUS
  Administered 2019-05-24: 80 mg via INTRAVENOUS

## 2019-05-24 MED ORDER — DEXAMETHASONE SODIUM PHOSPHATE 4 MG/ML IJ SOLN
INTRAMUSCULAR | Status: AC
Start: 2019-05-24 — End: ?
  Filled 2019-05-24: qty 1

## 2019-05-24 MED ORDER — MASTISOL ADHESIVE EX LIQD
CUTANEOUS | Status: AC
Start: 2019-05-24 — End: ?
  Filled 2019-05-24: qty 0.67

## 2019-05-24 MED ORDER — MIDAZOLAM HCL 1 MG/ML IJ SOLN (WRAP)
INTRAMUSCULAR | Status: AC
Start: 2019-05-24 — End: ?
  Filled 2019-05-24: qty 2

## 2019-05-24 MED ORDER — SODIUM CHLORIDE 0.9 % IV SOLN
1000.0000 mg | Freq: Once | INTRAVENOUS | Status: AC
Start: 2019-05-24 — End: 2019-05-24
  Administered 2019-05-24: 11:00:00 1000 mg via INTRAVENOUS
  Filled 2019-05-24: qty 1000

## 2019-05-24 MED ORDER — LIDOCAINE HCL 2 % IJ SOLN
INTRAMUSCULAR | Status: AC
Start: 2019-05-24 — End: ?
  Filled 2019-05-24: qty 20

## 2019-05-24 MED ORDER — FENTANYL CITRATE (PF) 50 MCG/ML IJ SOLN (WRAP)
INTRAMUSCULAR | Status: DC | PRN
Start: 2019-05-24 — End: 2019-05-24
  Administered 2019-05-24: 50 ug via INTRAVENOUS
  Administered 2019-05-24 (×2): 25 ug via INTRAVENOUS

## 2019-05-24 MED ORDER — VH BIO-K PLUS PROBIOTIC 50 BIL CFU CAPSULE
50.0000 | DELAYED_RELEASE_CAPSULE | Freq: Every day | ORAL | Status: DC
Start: 2019-05-24 — End: 2019-05-24

## 2019-05-24 MED ORDER — MASTISOL ADHESIVE EX LIQD
CUTANEOUS | Status: DC | PRN
Start: 2019-05-24 — End: 2019-05-24
  Administered 2019-05-24: .67 mL via TOPICAL

## 2019-05-24 MED ORDER — FENTANYL CITRATE (PF) 50 MCG/ML IJ SOLN (WRAP)
INTRAMUSCULAR | Status: AC
Start: 2019-05-24 — End: ?
  Filled 2019-05-24: qty 2

## 2019-05-24 MED ORDER — SODIUM CHLORIDE 0.9 % IV SOLN
INTRAVENOUS | Status: AC
Start: 2019-05-24 — End: ?
  Filled 2019-05-24: qty 250

## 2019-05-24 MED ORDER — VANCOMYCIN HCL 1 G IV SOLR
INTRAVENOUS | Status: AC
Start: 2019-05-24 — End: ?
  Filled 2019-05-24: qty 1000

## 2019-05-24 MED ORDER — ONDANSETRON HCL 4 MG/2ML IJ SOLN
INTRAMUSCULAR | Status: AC
Start: 2019-05-24 — End: ?
  Filled 2019-05-24: qty 2

## 2019-05-24 MED ORDER — ONDANSETRON HCL 4 MG/2ML IJ SOLN
INTRAMUSCULAR | Status: DC | PRN
Start: 2019-05-24 — End: 2019-05-24
  Administered 2019-05-24: 4 mg via INTRAVENOUS

## 2019-05-24 MED ORDER — BUPIVACAINE HCL (PF) 0.5 % IJ SOLN
INTRAMUSCULAR | Status: AC
Start: 2019-05-24 — End: ?
  Filled 2019-05-24: qty 30

## 2019-05-24 MED ORDER — LACTATED RINGERS IV SOLN
INTRAVENOUS | Status: DC
Start: 2019-05-24 — End: 2019-05-24

## 2019-05-24 MED ORDER — MIDAZOLAM HCL 1 MG/ML IJ SOLN (WRAP)
INTRAMUSCULAR | Status: DC | PRN
Start: 2019-05-24 — End: 2019-05-24
  Administered 2019-05-24: 2 mg via INTRAVENOUS

## 2019-05-24 SURGICAL SUPPLY — 19 items
BANDAGE COBAN 4" LF STERILE (Dressings) ×1 IMPLANT
BANDAGE SOF BAND KERLIX 4.5 IN (Dressings) ×3 IMPLANT
BLADE SCALPEL #15 (Supply) ×8 IMPLANT
CUFF TOURNIQUET 18 (Supply) ×2 IMPLANT
DRAPE C-ARM COVER 60 X 42 (Supply) ×1 IMPLANT
GLOVE BIOGEL UND PI IND SZ 8.5 (Supply) ×2 IMPLANT
GOWN COVER XLG (Supply) ×2 IMPLANT
NDL 18GA X 1.5 SAFETYGLIDE (Supply) ×2 IMPLANT
NDL HYPO 25GA X 1.5 (Supply) ×6 IMPLANT
PACK SMH BASIC ORTHOPEDIC (Supply) ×2 IMPLANT
SPONGE GAUZE 4 X 4 12 PLY (Dressings) ×2 IMPLANT
SPONGE GAUZE 4 X 4 STERILE (Dressings) ×1 IMPLANT
STERISTRIP .25 (Supply) ×2 IMPLANT
SUT MONOCRYL 4-0 Y496G (Supply) ×2 IMPLANT
SUT PDS 3-0 Z423H (Supply) IMPLANT
SUT VICRYL 4-0 J496H (Supply) ×2 IMPLANT
SYRINGE 10CC LL (Supply) ×6 IMPLANT
TRAY SKIN PREP  DRY (Supply) ×1 IMPLANT
WIRE KIRSHNER 9X1.6 THREADED ×1 IMPLANT

## 2019-05-24 NOTE — Progress Notes (Signed)
Per patient's dexcom BG 166

## 2019-05-24 NOTE — Pre-Procedure Instructions (Signed)
History & physical reviewed, patient examined and no changes in history and physical.

## 2019-05-24 NOTE — Anesthesia Postprocedure Evaluation (Signed)
Anesthesia Post Evaluation    Patient: [name]    Procedure(s):  SESAMOIDECTOMY    Anesthesia type: MAC    Last Vitals:   Vitals Value Taken Time   BP 128/89 05/24/19 1155   Temp 36.4 C (97.6 F) 05/24/19 1155   Pulse 85 05/24/19 1155   Resp 18 05/24/19 1155   SpO2 96 % 05/24/19 1155                 Anesthesia Post Evaluation:     Patient Evaluated: PACU  Patient Participation: complete - patient participated  Level of Consciousness: awake and alert  Pain Score: 0  Pain Management: adequate  Multimodal analgesia pain management approach    Airway Patency: patent    Anesthetic complications: No      PONV Status: none    Cardiovascular status: acceptable  Respiratory status: acceptable  Hydration status: acceptable        Signed by: Juanita Laster, 05/24/2019 12:11 PM

## 2019-05-24 NOTE — Discharge Instructions (Signed)
ObstructiveSleep Apnea  Obstructive sleep apnea is a condition that causes your air passages to become narrowed or blocked during sleep. As a result, breathing stops for short periods. Your body wakes up enough for breathing to start again,though you don't remember it. The cycle of stopped breathing and brief awakenings can repeat dozens of times a night. This prevents the body from getting to the deeper stages of sleep that are needed for good rest and may cause your body's oxygen level to fall.   Signs of sleep apnea include loud snoring, noisy breathing, and gasping sounds during sleep. Daytime symptoms include waking up tired after a full night's sleep,waking up with headaches, feeling very sleepy or falling asleep during the day,and having problems with memory or concentration.   Risk factors for sleep apnea include:   Being overweight   Being a man, or a woman in menopause   Smoking   Using alcohol or sedating medicines   Having enlarged structures in the nose or throat such as enlarged tonsils or adenoids, or extra tissue in the airway  Home care  Lifestyle changes that can help treat snoring and sleep apnea include the following:   If you are overweight, lose weight. Talk with your healthcare provider about a weight-loss plan for you.   Don't drink alcohol for 3 to 4 hours before bedtime. Don't take sedating medicines. Ask your healthcare provider about the medicines you take.   If you smoke, talk to your healthcare provider about ways to quit. It's important to stay away from secondhand smoke. don't use e-cigarettes because of their harmful side effects.   Sleep on your side. This can help prevent gravity from pulling relaxed throat tissues into your breathing passages.   If you have allergies or sinus problems that block your nose, ask your healthcare provider for help.   Use positive airway pressure (PAP): Discuss with your healthcare provider the benefits of using PAP at home and the  type of PAP that is best for you.  Follow-up care  Follow up with your healthcare provider, or as advised.A diagnosis of sleep apnea is made with a sleep study. Your healthcare provider can tell you more about this test.   When to seek medical advice  See your healthcare provider if you have daytime symptoms of sleep apnea. These include:    Waking up tired after a full night's sleep   Waking up with a headache   Feeling very sleepy or falling asleep during the day   Having problems with memory or concentration  Also talk with your provider if your partner tells you that you snore, gasp for air, or stop breathing while you sleep.   Seeing your provider is important because sleep apnea can make you more likely to have certain health problems. These include high blood pressure, heart attack, stroke, and sexual dysfunction. If you have sleep apnea, talk with your healthcare provider about the best treatments for you.   [employer] last reviewed this educational content on 10/15/2017   2000-2020 [employer] [address], [place] [address]. All rights reserved. This information is not intended as a substitute for professional medical care. Always follow your healthcare professional's instructions.

## 2019-05-24 NOTE — Progress Notes (Signed)
Per patient's dexcom BG is 174

## 2019-05-24 NOTE — Transfer of Care (Signed)
Anesthesia Transfer of Care Note    Patient: [name]    Last vitals:   Vitals:    05/24/19 1151   BP: 144/76   Pulse: 87   Resp: 16   Temp:    SpO2: 97%       Oxygen: Room Air     Mental Status:awake    Airway: Natural    Cardiovascular Status:  stable

## 2019-05-24 NOTE — H&P (Signed)
[name] is an 54 y.o. male who more recently presented to our office 04/01/2019 with a neglected ulcer under a large plantar tyloma L 1st MPJ.  He had previously had bone resected from the same area due to osteomyelitis 2016.    Past Medical History:   Diagnosis Date    Abnormal vision     Complication of anesthesia     last time he was kicking his legs had to wake him up    Diabetes mellitus     Diabetic ulcer of left foot 12/26/14    Disc     End-stage renal disease 2021    Stage 3 Kidney Disease    Gastroesophageal reflux disease     Hyperlipidemia     Hypertension     Seasonal allergic rhinitis     Sleep apnea     not tested    Type 2 diabetes mellitus, controlled        Allergies:   Allergies   Allergen Reactions    Flu Virus Vaccine Anaphylaxis     No current facility-administered medications for this encounter.     Current Outpatient Medications   Medication Sig Dispense Refill    aspirin EC 81 MG EC tablet Take 1 tablet (81 mg total) by mouth every evening. 90 tablet 3    atorvastatin (LIPITOR) 40 MG tablet Take 1 tablet by mouth once daily 30 tablet 0    Cholecalciferol (VITAMIN D) 2000 units tablet Take 2,000 Units by mouth daily      clindamycin (CLEOCIN) 150 MG capsule Take 300 mg by mouth 3 (three) times daily      furosemide (LASIX) 20 MG tablet Take 20 mg by mouth daily         insulin glargine (BASAGLAR KWIKPEN) 100 UNIT/ML injection pen Inject 62 Units into the skin nightly 60 mL 1    liraglutide (Victoza) 18 MG/3ML injection INJECT 1.8MG  SUBCUTANEOUSLY ONCE DAILY 9 mL 5    Multiple Vitamin (multivitamin) capsule Take 1 capsule by mouth daily      NovoLOG FlexPen 100 UNIT/ML injection pen INJECT SUBCUTANEOUSLY PRIOR TO MEALS PER CORRECTION SCALE UP TO 20 UNITS DAILY. 60 mL 3    ondansetron (ZOFRAN) 8 MG tablet Take 1 tablet (8 mg total) by mouth every 6 (six) hours as needed for Nausea 10 tablet 0    pantoprazole (Protonix) 40 MG tablet Take 1 tablet (40 mg total) by  mouth daily 30 tablet 0    terbinafine (LamISIL) 250 MG tablet Take 250 mg by mouth daily      Blood Glucose Monitoring Suppl (RELION PRIME MONITOR) Device by Does not apply route USE TO TEST BLOOD GLUCOSE FOUR TIMES DAILY      Continuous Blood Gluc Receiver (Dexcom G6 Receiver) Device 1 Device by Does not apply route daily 1 Device 0    Continuous Blood Gluc Sensor (Dexcom G6 Sensor) Misc 1 each by Does not apply route every 10 (ten) days 9 each 3    Continuous Blood Gluc Transmit (Dexcom G6 Transmitter) Misc USE TO CHECK BLOOD SUGAR DAILY 1 each 0    gabapentin (NEURONTIN) 300 MG capsule Take 1 capsule (300 mg total) by mouth 3 (three) times daily 270 capsule 0    Glucagon (BAQSIMI TWO PACK) 3 MG/DOSE Powder 1 each by Nasal route as needed (severe HYPOglycemia) 1 each 1    glucose blood (RELION PRIME TEST) test strip 1 each by Other route 4 (four) times daily Use as instructed  Insulin Pen Needle 31G X 6 MM Misc by Does not apply route.Use up to 7 times a day as directed      lisinopril (ZESTRIL) 20 MG tablet Take 2 tablets (40 mg total) by mouth nightly 180 tablet 0      Past Surgical History:   Procedure Laterality Date    ARTHRODESIS, TOE Left 05/04/2015    Procedure: ARTHRODESIS, TOE;  Surgeon: Janora Norlander, DPM;  Location: [facility];  Service: Podiatry;  Laterality: Left;  1st MPJ FUSION    DEBRIDEMENT & IRRIGATION, LOWER EXTREMITY Left 03/12/2015    Procedure: Colo, LOWER EXTREMITY;  Surgeon: Janora Norlander, DPM;  [facility];  Service: Podiatry;  Laterality: Left;  I&D LEFT FOOT FOR BONE INFECTION    DEBRIDEMENT & IRRIGATION, LOWER EXTREMITY Left 03/07/2016    Procedure: DEBRIDEMENT & IRRIGATION, LEFT FOOT 2ND TOE, EXCISION OF 2ND METATARSEL;  Surgeon: Janora Norlander, DPM;  Location: [facility];  Service: Podiatry;  Laterality: Left;  I&D Left Foot    HAND SURGERY       Active Problems:    * No active hospital  problems. *    There were no vitals taken for this visit.    Review of Systems   All other systems reviewed and are negative.      Physical Exam  Vitals reviewed.   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  I suspect the retained sesamoids and some other bony abnormality is responsible for the chronic non-healing ulcer.    Neurological:      Comments: Peripheral neuropathy.   Psychiatric:         Mood and Affect: Mood normal.         Behavior: Behavior normal.     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    Plan:  Patient has elected to have a sesamoidectomy left foot to be performed 05/24/2019 at [facility].    [name]  05/24/2019

## 2019-05-24 NOTE — Anesthesia Preprocedure Evaluation (Addendum)
Anesthesia Evaluation    AIRWAY    Mallampati: IV    TM distance: <3 FB  Neck ROM: full  Mouth Opening:full  Planned to use difficult airway equipment: No CARDIOVASCULAR    cardiovascular exam normal       DENTAL           PULMONARY    pulmonary exam normal     OTHER FINDINGS                  Relevant Problems   NEURO/PSYCH   (+) History of kidney injury      CARDIO   (+) Essential hypertension      GU/RENAL   (+) Diabetic nephropathy      ENDO   (+) Type 2 diabetes mellitus with hyperglycemia, with long-term current use of insulin      OTHER   (+) Osteomyelitis   (+) Osteomyelitis of left foot, unspecified chronicity       PSS Anesthesia Comments:  CONCLUSIONS    The left ventricular ejection fraction is normal, estimated at 60%. There is grade I diastolic dysfunction of the left ventricle     (impaired relaxation pattern).    No vegetations noted on any of the cardiac valves.    No significant valvular abnormalities.       There is no prior study for comparison.              Corbin Ade MD     (Electronically Signed)     Final Date:16 March 2015 15:37         Anesthesia Plan    ASA 4     MAC               (Risks, benefits and alternatives discussed of monitored anesthesia care, including airway issues, aspiration,  nausea, pain, cardiac and or neurologic issues, prolonged mechanical ventilation , ICU stay, damage to teeth and eyes. Patient appropriately NPO.  Questions answered. Pt or guardian accepts.)      intravenous induction   Detailed anesthesia plan: MAC      Post Op: plan for postoperative opioid use    Post op pain management: per surgeon    informed consent obtained    ECG reviewed               Signed by: Romie Levee 05/24/19 8:52 AM

## 2019-05-24 NOTE — Discharge Instr - AVS First Page (Addendum)
Richard D. Kent, D.P.M.  Podiatric Medicine & Surgery  [address]  [place], [place] [address]  Telephone [phone]  Fax [phone]      POST-OPERATIVE HOME CARE    Having performed your operation, we are interested in your prompt recovery and comfort.  Please cooperate with us by carefully adhering to the following rules of post-operative home care.      1.  Day of surgery - Elevate both feet in the car if possible and please go directly home.  Keep your feet elevated by putting pillows under the knees and legs.  The heels should floated without the feet or heels resting on anything.    2.  Discomfort and swelling - The numbness will last 4-5 hours, and swelling is expected.  In some cases the skin of the foot or leg may take on a bruised appearance.  Apply ice 20-30 mins off and on.    3.  Bleeding - A slight amount of drainage on the bandage is normal and should cause no concern.  Resting the foot in an elevated position will limit bleeding.  If bleeding continues, wrap additional bandage around the foot and apply an ice pack to your oozing area.  The surgical solution that was painted on your foot prior to the operation is brown in color.  If a drain tube is present, call for instructions on changing this, if uncertain.    4.  Dressing - Keep the bandage absolutely DRY and DO NOT remove it.  If the bandage becomes too snug, remove the ace bandage and reapply.  If this does not help, call the office.  If the bandage should inadvertently become wet, call the office immediately.    5.  Stitches - The stitches will remain in place approximately 20 days, depending upon the nature of the operation.  A slight pulling sensation may be felt due to the stitches, but this is a normal occurrence.    6.  Medications - Have prescriptions filled prior to the day of surgery.  Take prescriptions as directed.    7.  Begin Aspirin 325mg twice per day starting the evening of surgery unless you are already taking a blood  thinner, have an allergy to Aspirin, or are instructed otherwise by Dr. Kent or your other physicians.  Continue taking until completely      healed and ambulating normally.    8.  After surgery, call the doctor if you have:   A.  Undue discomfort or swelling   B.  Excessive heavy or prolonged bleeding   C.  Dizziness or fainting   D.  If you are troubled or worried about anything related to your surgery please call the office or after hours call [phone] and leave a message!    9.  Please remain non-weight bearing with the use of crutches, walkers, or a wheelchair, unless otherwise directed by the doctor.    Thank you in advance for your cooperation.    Richard D. Kent, DPM.     [facility]  Discharge Instructions    A.  To have a safe recovery from your operation, for the next 24 hours.   1.  Do not drink alcoholic beverages.   2.  Do not make important personal or business decisions.   3.  Do not drive a vehicle or operate hazardous machinery.   4.  Do not become overheated or too cold.    B.  Medications:  See attached   medication form.    C.  Diet:   1.  Initially start with light foods and then progress to your normal diet.   2.  Drink plenty of fluids such as water, tea and fruit juices.    D.  Educational Handouts:   1.  Physician's Instruction sheet.    E.  Contact your surgeon if you have any questions or if any of the following occur:   1.  Your bandages become soaked with bright red blood (do not remove the original bandages).   2.  Excessive swelling around the wound/incision.   3.  Chills and fever above 101F.   4.  Excessive pain.   5.  Excessive drainage.   6.  Continued nausea or vomiting.    These instructions have been reviewed with the patient and responsible adult.    Telephone numbers:   [facility] Switchboard   [phone]   [facility]  [phone]   Emergency Department  [phone]

## 2019-05-26 NOTE — Progress Notes (Signed)
Results reviewed. Will address at upcoming appointment.

## 2019-05-26 NOTE — Op Note (Signed)
Date: 05/24/2019    SURGEON: Delfino Lovett D. Dovid Bartko, DPM.    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    PROCEDURE:  1.  Sesamoidectomy, left foot.  2.  Resection of bone spurs, 1st metatarsal and hallux proximal   phalanx, left foot.    ANESTHESIA: Local MAC; equal mixture of 2% lidocaine plain plus 0.5%   bupivacaine plain x 14ml.    HEMOSTASIS: PAT at 213mmHg x 75 minutes.    ESTIMATED BLOOD LOSS: Less than 10ml.    DESCRIPTION OF PROCEDURE:   The patient was taken to the operating room and placed on the OR table   in a supine position.  A pneumatic ankle tourniquet was applied to the   left lower extremity, but not inflated.  Following adequate IV   sedation, approximately 16ml equal mixture of 2% lidocaine plain plus   0.5% bupivacaine plain was infiltrated into the left foot in Mayo   block fashion.  The left foot was prepped and draped in the usual   sterile manner and elevated for approximately 3 minutes, following   which the tourniquet was inflated to 261mmHg.    Attention was directed to the inferomedial aspect of the patient's   left foot where a 6cm curvilinear longitudinal skin incision was made   overlying the 1st MPJ.  The incision was sharply deepened through   subcutaneous tissue taking care to avoid all neurovascular and   tendinous structures.  A curvilinear capsulotomy was performed in line   with the original skin incision.  The dorsal aspect of the tibial   sesamoid was sharply circumscribed and a surgical plane was sharply   created exposing the medial half of the tibial sesamoid.  Using a   power wire driver, a 0.300" diameter threaded K-wire was driven into  the medial aspect of the tibial sesamoid.  The K-wire was then used   like a joystick to manipulate the sesamoid as it was sharply resected,   taking care to avoid  trauma to FHB and FHL tendons.  Attention was   then directed to the fibular sesamoid, which was similarly resected.    Multiple bone spurs were encountered on the distal plantar aspect of   the 1st metatarsal and to a lesser extent on the plantar proximal   aspect of the hallux proximal phalanx caused by significant DJD from   retained sesamoids and previous damage caused by osteomyelitis of the  1st ray.  The bone spurs were resected using rongeurs until the   plantar aspect of the 1st metatarsal and hallux proximal phalanx were   relatively flat and the bone edges were rasped smooth. FHL and FHB   tendons were noted to be intact with an improvement in hallux   flexibility and some reduction in the severe hallux malleus deformity.    The surgical site was copiously lavaged with sterile saline and  inspected.  C-arm fluoroscopy was utilized during the procedure to   ascertain adequate resection of bone.  Vancomycin 1g IV was initiated   at this point.    Deep fascia was reapproximated using 4-0 Vicryl simple interrupted   utures.  Subcutaneous tissue was reapproximated using 4-0 Vicryl   orizontal mattress sutures.  Skin was closed using 4-0 Monocryl   subcuticular sutures.  Mastisol and Steri-Strips were transversely   applied across the incision  which was dressed with Xeroform, dry   sterile gauze, and Kerlix over which a Coban bandage was applied.  The   patient tolerated the anesthesia and procedure well and left the   operating room to the recovery room with vital signs stable and   vascular status returned to preoperative levels.        [id number]  DD: 05/26/2019 14:56:37  DT: 05/26/2019 16:22:48  JOB: [id number]

## 2019-05-27 ENCOUNTER — Encounter: Payer: Self-pay | Admitting: Podiatric Surgery

## 2019-05-27 ENCOUNTER — Ambulatory Visit: Payer: BC Managed Care – PPO | Attending: Family | Admitting: Family

## 2019-05-27 VITALS — Wt 247.0 lb

## 2019-05-27 DIAGNOSIS — N184 Chronic kidney disease, stage 4 (severe): Secondary | ICD-10-CM | POA: Insufficient documentation

## 2019-05-27 DIAGNOSIS — N185 Chronic kidney disease, stage 5: Secondary | ICD-10-CM | POA: Insufficient documentation

## 2019-05-27 DIAGNOSIS — Z794 Long term (current) use of insulin: Secondary | ICD-10-CM

## 2019-05-27 DIAGNOSIS — E1165 Type 2 diabetes mellitus with hyperglycemia: Secondary | ICD-10-CM

## 2019-05-27 DIAGNOSIS — I10 Essential (primary) hypertension: Secondary | ICD-10-CM

## 2019-05-27 DIAGNOSIS — E1122 Type 2 diabetes mellitus with diabetic chronic kidney disease: Secondary | ICD-10-CM

## 2019-05-27 DIAGNOSIS — E782 Mixed hyperlipidemia: Secondary | ICD-10-CM

## 2019-05-27 DIAGNOSIS — N189 Chronic kidney disease, unspecified: Secondary | ICD-10-CM

## 2019-05-27 NOTE — Progress Notes (Signed)
Endocrine Provider Progress Note      Patient Name:  [name] [[id number]] DOB: Nov 19, 1965  Date: 05/27/2019  This visit was conducted with the use of interactive audio telecommunication via Telephone that permitted real time communication between [employer] and me.  [name] requested telecommunication visit due to Coronavirus pandemic and consented to participation. [name] received services at home, while I was located at [facility], [place], [place].  Time in: 4:03 PM  Time out:  4:16 PM   More than 50% of the time was spent counseling the patient regarding the above outlined medical conditions.   Subjective:          [name] is a 54 y.o. male who presents for follow up of type 2 diabetes mellitus.   Since the last visit, the patient seen in ER for dental infection. Treated and improved. Has had L foot callous for >1 yr that over the last 1-2 weeks has been "shiny" and "opened up". Has been keeping it clean and has an scheduled appointment with Podiatry next week. 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 Circulation checked at the time of that hospitalization was "good" per patient. Additionally, pt taken off of the SGLT-2 t that time d/t concern of amputation risk. Pt was taking SGLT-2 for DM and renal protection from h/o nephrotic range proteinuria. Pt has not seen his nephrologist in over a year d/t cost. He states he has the funds to f/u now and plans to do so.    Pt denies polyuria, polydipsia. Experiencing some hypoglycemia    Since last visit, patient had foot surgery last week. Records indicate patient had sesamoidectomy, left foot. Resection of bone spurs, 1st metatarsal and hallux proximal phalanx, left foot.    Monitoring, Compliance and Complications:  Diagnosis Date: ~2000-2001  Prior Diabetic Treatment: Wilder Glade (discontinued due to renal function) -patient reports today Wilder Glade was stopped during  hospitalization for osteomyelitis.   Hgb A1c at time of referral: 11.1% (done 12/20/16). A1C due now  HGB A1C goal based on age and comorbidities is 7%.  The patient has the following diabetic complications:  retinopathy, nephropathy and neuropathy, history of foot ulcer.  He has had multiple surgeries for osteomyelitis on his left foot in 2017.     Glucose Meter: Reli-On, not sure which model  Glucose Meter Validation Date: never  The patient did not bring the glucose meter or glucose readings today.  Compliance with blood glucose monitoring: fair   The patient is not currently testing home blood glucose.   Glucose readings are as follows:  -    CGM Type:  Dexcom G6  CGM use fequency:  all of the time   Calibration frequency: This CGM does not require callibration  Percent of time in target range: -  For additional CGM data, see scanned CGM download attached to this visit note.   Average sensor glucose: -  Percent of time above target range: -  Percent of time below target range: -   Patient not currently sharing sensor data with [place].  Glucose ranges 4/10-4/12 reported 101-179      Injections:   Injections are given by patient.   Injections sites include: abdominal wall  Injection compliance: The patient never misses injections.  No problems noted with injection sites.      Diet, Exercise, and Weight:     The patient follows no particular diet.  Compliance with  diet has been variable.   Exercise: LImited since foot surgery. using knee scooter   Weight: Weight has been stable since last visit.    Hypoglycemia:   The patient has experienced no episodes of hypoglycemia since the last visit. Symptoms of hypoglycemia include: hunger and shaking, blurred vision, nausea. . The patient treats hypoglycemia with rice crispy treat or small amount of M&Ms (about 10-15)    The patient has Nasal Glucagon (Baqsimi). This is up-to-date and family members have been instructed in its use.   The patient does not have a medical alert  bracelet or necklace.    Diabetic Education: The patient has never received diabetes education.  Would like to see if insurance covers it now since he has not had it in the past d/t limited coverage         Diabetic Complication Screening/Treatment  Eye exam: Dr. Andres Ege [facility]". Last visit was 2020 "Some damage there. I assume blood vessels. He was due for f/u in Feb but has not yet followed up.   Urine microalbumin/creatinine ratio: N/A. The patient has CKD followed by nephrologist.  Dr. Elby Beck. He is planning to establish with new provider per his preference. Referred to Dr. Julienne Kass Care: Denies pain in feet right now. Followed by Dr. Nada Boozer   Monofilament: not intact   Aspirin Therapy: The patient is currently taking aspirin at dose indicated in the medication list.   Cardiovascular risk factors: diabetes mellitus, dyslipidemia, hypertension, male gender, obesity (BMI >= 30 kg/m2), sedentary lifestyle and smoking/ tobacco exposure  Ace Inhibitor/ARB: The patient is taking ACE-I/ARB at dose indicated in medication list and is tolerating this well.  Dental care: Patient has received regular dental care, most recently Feb-March 2021 Had 4 teeth extracted    Current Outpatient Medications   Medication Sig Dispense Refill    aspirin EC 81 MG EC tablet Take 1 tablet (81 mg total) by mouth every evening. 90 tablet 3    atorvastatin (LIPITOR) 40 MG tablet Take 1 tablet by mouth once daily 30 tablet 0    Blood Glucose Monitoring Suppl (RELION PRIME MONITOR) Device by Does not apply route USE TO TEST BLOOD GLUCOSE FOUR TIMES DAILY      Cholecalciferol (VITAMIN D) 2000 units tablet Take 2,000 Units by mouth daily      clindamycin (CLEOCIN) 150 MG capsule Take 300 mg by mouth 3 (three) times daily      Continuous Blood Gluc Receiver (Dexcom G6 Receiver) Device 1 Device by Does not apply route daily 1 Device 0    Continuous Blood Gluc Sensor (Dexcom G6 Sensor) Misc 1 each by Does not apply route every 10 (ten)  days 9 each 3    Continuous Blood Gluc Transmit (Dexcom G6 Transmitter) Misc USE TO CHECK BLOOD SUGAR DAILY 1 each 0    furosemide (LASIX) 20 MG tablet Take 20 mg by mouth daily         gabapentin (NEURONTIN) 300 MG capsule Take 1 capsule (300 mg total) by mouth 3 (three) times daily 270 capsule 0    Glucagon (BAQSIMI TWO PACK) 3 MG/DOSE Powder 1 each by Nasal route as needed (severe HYPOglycemia) 1 each 1    glucose blood (RELION PRIME TEST) test strip 1 each by Other route 4 (four) times daily Use as instructed      insulin glargine (BASAGLAR KWIKPEN) 100 UNIT/ML injection pen Inject 62 Units into the skin nightly 60 mL 1    Insulin Pen Needle 31G  X 6 MM Misc by Does not apply route.Use up to 7 times a day as directed      liraglutide (Victoza) 18 MG/3ML injection INJECT 1.8MG  SUBCUTANEOUSLY ONCE DAILY 9 mL 5    lisinopril (ZESTRIL) 20 MG tablet Take 2 tablets (40 mg total) by mouth nightly 180 tablet 0    Multiple Vitamin (multivitamin) capsule Take 1 capsule by mouth daily      NovoLOG FlexPen 100 UNIT/ML injection pen INJECT SUBCUTANEOUSLY PRIOR TO MEALS PER CORRECTION SCALE UP TO 20 UNITS DAILY. 60 mL 3    ondansetron (ZOFRAN) 8 MG tablet Take 1 tablet (8 mg total) by mouth every 6 (six) hours as needed for Nausea 10 tablet 0    pantoprazole (Protonix) 40 MG tablet Take 1 tablet (40 mg total) by mouth daily 30 tablet 0    terbinafine (LamISIL) 250 MG tablet Take 250 mg by mouth daily       No current facility-administered medications for this visit.      Medication review with [name] suggested compliance all of the time.       Immunization History   Administered Date(s) Administered    Td 06/14/2009    Td,preservative free 06/14/2009    Tdap 07/06/2011     The following portions of the patient's history were reviewed and updated as appropriate: current medications, past medical history and problem list.  The following information was also reviewed at today's visit: lab data    Review  of Systems  As above.      Objective:      Vital Signs: Wt 112 kg (247 lb)    BMI 39.87 kg/m   PE  No physical exam This is a telephone visit.     Lab Review    Lab Results   Component Value Date    HGBA1CPERCNT 7.2 03/05/2019    HGBA1CPERCNT 7.2 11/07/2018    HGBA1CPERCNT 6.6 07/19/2018    TSH 1.59 09/13/2017    CHOL 158 03/05/2019    HDL 32 (L) 03/05/2019    LDL 60 03/05/2019    TRIG 331 (H) 03/05/2019    ALT 22 05/23/2019    B12 419 09/13/2017    VITD 19.6 (L) 02/14/2019    GLU 96 05/23/2019    K 5.0 05/23/2019    CA 8.0 (L) 05/23/2019    CREAT 1.80 (H) 05/23/2019    CO2 20.8 05/23/2019    EGFR 41 (L) 05/23/2019    NA 141 05/23/2019    ALKPHOS 134 05/23/2019       03/09/19 vitamin D2/D3 total 21 (normal 20-50)    Assessment:      1. Essential hypertension     2. Type 2 diabetes mellitus with hyperglycemia, with long-term current use of insulin  Lipid panel    Hemoglobin O0A    Basic Metabolic Panel    ALT   3. Mixed hyperlipidemia     4. Chronic kidney disease, unspecified CKD stage       Diabetes: Limited glucose data available for review today. Discussed reported sensor readings appear in target much of time. Patient will set up data sharing so we can get better picture of glycemic status.  Discussed importance of glycemic control especially given recent surgery.  Lipids: We have reviewed that the triglycerides.We have discussed that they are likely to improve with good diabetic control.    Vitamin D: Blood pressure is well controlled on the current regimen which is well tolerated and will be  continued.   Proteinuria/CKD 2: Patient was referred to nephrology again in January. States he has not been contacted. Will follow up on this.     Plan:   Below is a summary of information and instructions we reviewed at today's appointment. Please review this information carefully and call if you have any questions regarding it.     Diabetes information/instructions:   Labwork: You are due for lab work  on or after  06/03/19.   You should fast for 10 hours prior to having this labwork done.    Self-monitoring of blood glucose: We recommend that you monitor your blood glucose at the current frequency Please fax, email, drop off or MyChartMessage your blood sugar readings for further adjustment of regimen  -please set up glucose sensor data sharing in the next week so we can review information.  Please contact Dexcom for help with troubleshooting.    Diabetic Medication/Insulin Instructions:  Continue your current diabetic medication regimen unchanged.   .  .    Low Blood Sugar Prevention and Treatment:   Please carry fast-acting carbohydrate source (preferably glucose tablets) with you at all times. Please treat low blood sugar with 15 grams carbohydrate (4 glucose tablets), wait 15 minutes, and recheck sugar. Re-treat if necessary.   Please notify office for any episode of severe hypoglycemia, such as those requiring assistance from someone else.     Aspirin: Please continue taking low-dose aspirin.    Diet:   Please decrease your intake of simple sugars and starches. Please watch your portions.  Please try to avoid missing meals.    Activity:   Resume gentle exercise as your health condition allows.     Foot Care:   Please examine feet daily and appropriately treat callouses or lesions. Please avoid being barefoot or wearing of ill fitting shoes. Please use lotion as needed to treat dry skin on feet.  Please follow up with your podiatrist regarding recent foot surgery, routine foot care    Diabetic Education:   We did not discuss diabetic education today. If you would like to follow up with a diabetic educator, please call the office and we will arrange for this    Screening for Health Complications of Diabetes:   Diabetic screening is up-to-date except for an eye exam. It is important to have your eyes examined every year. .    The following additional instructions relate to health conditions other than your diabetes:   I  will follow up on referral to kidney specialist.     Return in about 3 months (around 08/26/2019).    Annamaria Boots, NP

## 2019-05-27 NOTE — Patient Instructions (Signed)
Below is a summary of information and instructions we reviewed at today's appointment. Please review this information carefully and call if you have any questions regarding it.     Diabetes information/instructions:   Labwork: You are due for lab work  on or after 06/03/19.   You should fast for 10 hours prior to having this labwork done.    Self-monitoring of blood glucose: We recommend that you monitor your blood glucose at the current frequency Please fax, email, drop off or MyChartMessage your blood sugar readings for further adjustment of regimen  -please set up glucose sensor data sharing in the next week so we can review information.  Please contact Dexcom for help with troubleshooting.    Diabetic Medication/Insulin Instructions:  Continue your current diabetic medication regimen unchanged.   .  .    Low Blood Sugar Prevention and Treatment:   Please carry fast-acting carbohydrate source (preferably glucose tablets) with you at all times. Please treat low blood sugar with 15 grams carbohydrate (4 glucose tablets), wait 15 minutes, and recheck sugar. Re-treat if necessary.   Please notify office for any episode of severe hypoglycemia, such as those requiring assistance from someone else.     Aspirin: Please continue taking low-dose aspirin.    Diet:   Please decrease your intake of simple sugars and starches. Please watch your portions.  Please try to avoid missing meals.    Activity:   Resume gentle exercise as your health condition allows.     Foot Care:   Please examine feet daily and appropriately treat callouses or lesions. Please avoid being barefoot or wearing of ill fitting shoes. Please use lotion as needed to treat dry skin on feet.  Please follow up with your podiatrist regarding recent foot surgery, routine foot care    Diabetic Education:   We did not discuss diabetic education today. If you would like to follow up with a diabetic educator, please call the office and we will arrange for  this    Screening for Health Complications of Diabetes:   Diabetic screening is up-to-date except for an eye exam. It is important to have your eyes examined every year. .    The following additional instructions relate to health conditions other than your diabetes:   I will follow up on referral to kidney specialist.

## 2019-05-29 ENCOUNTER — Other Ambulatory Visit (RURAL_HEALTH_CENTER): Payer: Self-pay

## 2019-05-29 DIAGNOSIS — Z794 Long term (current) use of insulin: Secondary | ICD-10-CM

## 2019-05-29 DIAGNOSIS — E1140 Type 2 diabetes mellitus with diabetic neuropathy, unspecified: Secondary | ICD-10-CM

## 2019-05-29 MED ORDER — GABAPENTIN 300 MG PO CAPS
300.00 mg | ORAL_CAPSULE | Freq: Three times a day (TID) | ORAL | 0 refills | Status: DC
Start: 2019-05-29 — End: 2019-08-26

## 2019-06-06 ENCOUNTER — Ambulatory Visit
Admission: RE | Admit: 2019-06-06 | Discharge: 2019-06-06 | Disposition: A | Discharge status: Home / Self Care | Payer: BC Managed Care – PPO | Source: Ambulatory Visit | Attending: Family | Admitting: Family

## 2019-06-06 DIAGNOSIS — Z794 Long term (current) use of insulin: Secondary | ICD-10-CM | POA: Insufficient documentation

## 2019-06-06 DIAGNOSIS — E1165 Type 2 diabetes mellitus with hyperglycemia: Secondary | ICD-10-CM | POA: Insufficient documentation

## 2019-06-06 LAB — ALT: ALT: 29 U/L (ref 0–55)

## 2019-06-06 LAB — LIPID PANEL
Cholesterol: 214 mg/dL — ABNORMAL HIGH (ref 75–199)
Coronary Heart Disease Risk: 5.35
HDL: 40 mg/dL (ref 40–55)
LDL Calculated: 96 mg/dL
Triglycerides: 391 mg/dL — ABNORMAL HIGH (ref 10–150)
VLDL: 78 — ABNORMAL HIGH (ref 0–40)

## 2019-06-06 LAB — BASIC METABOLIC PANEL
Anion Gap: 14.3 mMol/L (ref 7.0–18.0)
BUN / Creatinine Ratio: 19.4 Ratio (ref 10.0–30.0)
BUN: 33 mg/dL — ABNORMAL HIGH (ref 7–22)
CO2: 23.3 mMol/L (ref 20.0–30.0)
Calcium: 8.5 mg/dL (ref 8.5–10.5)
Chloride: 108 mMol/L (ref 98–110)
Creatinine: 1.7 mg/dL — ABNORMAL HIGH (ref 0.80–1.30)
EGFR: 44 mL/min/{1.73_m2} — ABNORMAL LOW (ref 60–150)
Glucose: 158 mg/dL — ABNORMAL HIGH (ref 71–99)
Osmolality Calculated: 292 mOsm/kg (ref 275–300)
Potassium: 4.6 mMol/L (ref 3.5–5.3)
Sodium: 141 mMol/L (ref 136–147)

## 2019-06-07 LAB — HEMOGLOBIN A1C: Hgb A1C, %: 7.9 %

## 2019-06-16 NOTE — Progress Notes (Signed)
Please call the patient with the following information:   Hemoglobin A1c result was 7.9%. This indicates worsening diabetic control.Please continue the current regimen and continue to send in blood sugars per prior instructions. Blood sugar result at the lab was 158. Did the patient take the meter and test the blood sugar while there? If so what was the meter result?   Creatinine slightly improved 1.7 (last time 1.8). Please keep appt with nephrology as planned.  Cholesterol is elevated. Worse than prior Triglycerides are above goal. But likely to improve with decreased intake of simple sugars and improved diabetic control.  Confirm taking atorvastatin.

## 2019-06-24 ENCOUNTER — Other Ambulatory Visit (RURAL_HEALTH_CENTER): Payer: Self-pay | Admitting: Family

## 2019-06-24 DIAGNOSIS — E1165 Type 2 diabetes mellitus with hyperglycemia: Secondary | ICD-10-CM

## 2019-06-24 DIAGNOSIS — Z794 Long term (current) use of insulin: Secondary | ICD-10-CM

## 2019-08-22 ENCOUNTER — Other Ambulatory Visit (RURAL_HEALTH_CENTER): Payer: Self-pay | Admitting: Family Medicine

## 2019-08-22 DIAGNOSIS — I10 Essential (primary) hypertension: Secondary | ICD-10-CM

## 2019-08-22 DIAGNOSIS — Z794 Long term (current) use of insulin: Secondary | ICD-10-CM

## 2019-08-26 ENCOUNTER — Ambulatory Visit (RURAL_HEALTH_CENTER): Payer: Self-pay | Admitting: Family

## 2019-08-26 ENCOUNTER — Other Ambulatory Visit (RURAL_HEALTH_CENTER): Payer: Self-pay | Admitting: Family Medicine

## 2019-08-26 ENCOUNTER — Other Ambulatory Visit (RURAL_HEALTH_CENTER): Payer: Self-pay | Admitting: Family

## 2019-08-26 DIAGNOSIS — Z794 Long term (current) use of insulin: Secondary | ICD-10-CM

## 2019-08-26 DIAGNOSIS — E1165 Type 2 diabetes mellitus with hyperglycemia: Secondary | ICD-10-CM

## 2019-08-28 ENCOUNTER — Other Ambulatory Visit (RURAL_HEALTH_CENTER): Payer: Self-pay | Admitting: Family Medicine

## 2019-08-28 ENCOUNTER — Other Ambulatory Visit (RURAL_HEALTH_CENTER): Payer: Self-pay

## 2019-08-28 DIAGNOSIS — Z794 Long term (current) use of insulin: Secondary | ICD-10-CM

## 2019-08-28 DIAGNOSIS — E1140 Type 2 diabetes mellitus with diabetic neuropathy, unspecified: Secondary | ICD-10-CM

## 2019-08-28 NOTE — Telephone Encounter (Signed)
Patients prescription for Gabapentin was set to print and the pharmacy did not receive the medication. Medication repended and set to [facility] for provider approval. Routing to Dr. Vickki Muff

## 2019-08-30 MED ORDER — GABAPENTIN 300 MG PO CAPS
300.00 mg | ORAL_CAPSULE | Freq: Three times a day (TID) | ORAL | 0 refills | Status: DC
Start: ? — End: 2019-08-30

## 2019-09-04 ENCOUNTER — Other Ambulatory Visit (RURAL_HEALTH_CENTER): Payer: Self-pay

## 2019-09-04 DIAGNOSIS — Z794 Long term (current) use of insulin: Secondary | ICD-10-CM

## 2019-09-04 MED ORDER — GABAPENTIN 300 MG PO CAPS
300.00 mg | ORAL_CAPSULE | Freq: Three times a day (TID) | ORAL | 0 refills | Status: DC
Start: ? — End: 2019-09-04

## 2019-09-04 NOTE — Telephone Encounter (Signed)
Medications were refilled on 08/30/2019 but were set to print. Pharmacy did not receive the printed copy of medications. Please send new prescription for [facility]-scribe. Routing to Dr. Vickki Muff

## 2019-09-24 ENCOUNTER — Telehealth (RURAL_HEALTH_CENTER): Payer: Self-pay

## 2019-09-24 NOTE — Telephone Encounter (Signed)
I think he should be fine. They are very different vaccines, it should be fine but they will ask him screening questions before they give it and if there is a problem they think applies to this vaccine they will tell him not to get it.

## 2019-09-24 NOTE — Telephone Encounter (Signed)
8/10 patient aware and understands, will try to get the vaccine.

## 2019-09-24 NOTE — Telephone Encounter (Signed)
8/10 Patient called asking if it was safe for him to take the Covid Vaccine, stating that he is worried because he is allergic to the flu vaccine.

## 2019-10-18 ENCOUNTER — Other Ambulatory Visit (RURAL_HEALTH_CENTER): Payer: Self-pay | Admitting: Family

## 2019-10-18 ENCOUNTER — Telehealth (RURAL_HEALTH_CENTER): Payer: Self-pay

## 2019-10-18 DIAGNOSIS — E1165 Type 2 diabetes mellitus with hyperglycemia: Secondary | ICD-10-CM

## 2019-10-18 DIAGNOSIS — Z794 Long term (current) use of insulin: Secondary | ICD-10-CM

## 2019-10-18 NOTE — Telephone Encounter (Signed)
[facility], states that patient came to lab to get lab work done and did not have lab orders in the system. What would you like pended for him?

## 2019-10-18 NOTE — Telephone Encounter (Signed)
Orders placed.

## 2019-10-18 NOTE — Telephone Encounter (Signed)
9/3 left detailed message for patient

## 2019-10-19 ENCOUNTER — Ambulatory Visit
Admission: RE | Admit: 2019-10-19 | Discharge: 2019-10-19 | Disposition: A | Discharge status: Home / Self Care | Payer: BC Managed Care – PPO | Source: Ambulatory Visit | Attending: Family | Admitting: Family

## 2019-10-19 DIAGNOSIS — E049 Nontoxic goiter, unspecified: Secondary | ICD-10-CM

## 2019-10-19 DIAGNOSIS — E1165 Type 2 diabetes mellitus with hyperglycemia: Secondary | ICD-10-CM | POA: Insufficient documentation

## 2019-10-19 DIAGNOSIS — Z794 Long term (current) use of insulin: Secondary | ICD-10-CM | POA: Insufficient documentation

## 2019-10-19 LAB — BASIC METABOLIC PANEL
Anion Gap: 11.9 mMol/L (ref 7.0–18.0)
BUN / Creatinine Ratio: 15.9 Ratio (ref 10.0–30.0)
BUN: 28 mg/dL — ABNORMAL HIGH (ref 7–22)
CO2: 25.6 mMol/L (ref 20.0–30.0)
Calcium: 9.4 mg/dL (ref 8.5–10.5)
Chloride: 109 mMol/L (ref 98–110)
Creatinine: 1.76 mg/dL — ABNORMAL HIGH (ref 0.80–1.30)
EGFR: 42 mL/min/{1.73_m2} — ABNORMAL LOW (ref 60–150)
Glucose: 133 mg/dL — ABNORMAL HIGH (ref 71–99)
Osmolality Calculated: 291 mOsm/kg (ref 275–300)
Potassium: 4.5 mMol/L (ref 3.5–5.3)
Sodium: 142 mMol/L (ref 136–147)

## 2019-10-19 LAB — HEMOGLOBIN A1C: Hgb A1C, %: 7.3 %

## 2019-10-19 LAB — ALT: ALT: 26 U/L (ref 0–55)

## 2019-10-23 ENCOUNTER — Ambulatory Visit: Payer: BC Managed Care – PPO | Attending: Family | Admitting: Family

## 2019-10-23 ENCOUNTER — Ambulatory Visit (RURAL_HEALTH_CENTER): Payer: Self-pay | Admitting: Family Medicine

## 2019-10-23 ENCOUNTER — Other Ambulatory Visit (RURAL_HEALTH_CENTER): Payer: Self-pay

## 2019-10-23 ENCOUNTER — Encounter (RURAL_HEALTH_CENTER): Payer: Self-pay | Admitting: Family

## 2019-10-23 VITALS — BP 138/78 | HR 85 | Wt 252.8 lb

## 2019-10-23 DIAGNOSIS — Z794 Long term (current) use of insulin: Secondary | ICD-10-CM

## 2019-10-23 DIAGNOSIS — E782 Mixed hyperlipidemia: Secondary | ICD-10-CM

## 2019-10-23 DIAGNOSIS — E049 Nontoxic goiter, unspecified: Secondary | ICD-10-CM

## 2019-10-23 DIAGNOSIS — E1165 Type 2 diabetes mellitus with hyperglycemia: Secondary | ICD-10-CM

## 2019-10-23 DIAGNOSIS — N189 Chronic kidney disease, unspecified: Secondary | ICD-10-CM

## 2019-10-23 DIAGNOSIS — E559 Vitamin D deficiency, unspecified: Secondary | ICD-10-CM

## 2019-10-23 DIAGNOSIS — I10 Essential (primary) hypertension: Secondary | ICD-10-CM

## 2019-10-23 LAB — THYROID STIMULATING HORMONE (TSH), REFLEX ON ABNORMAL TO FREE T4, SERUM: TSH (Reflex Free T4 if Indicated): 1.33 u[IU]/mL (ref 0.40–4.20)

## 2019-10-23 MED ORDER — BASAGLAR KWIKPEN 100 UNIT/ML SC SOPN
62.00 [IU] | PEN_INJECTOR | Freq: Every evening | SUBCUTANEOUS | 1 refills | Status: DC
Start: ? — End: 2019-10-23

## 2019-10-23 MED ORDER — VICTOZA 18 MG/3ML SC SOPN
PEN_INJECTOR | SUBCUTANEOUS | 5 refills | Status: DC
Start: ? — End: 2019-10-23

## 2019-10-23 MED ORDER — NOVOLOG FLEXPEN 100 UNIT/ML SC SOPN
PEN_INJECTOR | SUBCUTANEOUS | 5 refills | Status: DC
Start: ? — End: 2019-10-23

## 2019-10-23 MED ORDER — NOVOLOG FLEXPEN 100 UNIT/ML SC SOPN
PEN_INJECTOR | SUBCUTANEOUS | 3 refills | Status: DC
Start: 2019-10-23 — End: 2020-03-30

## 2019-10-23 MED ORDER — DEXCOM G6 TRANSMITTER MISC
1.00 | Freq: Every day | 0 refills | Status: DC
Start: ? — End: 2019-10-23

## 2019-10-23 MED ORDER — DEXCOM G6 SENSOR MISC
1.00 | 3 refills | Status: DC
Start: ? — End: 2019-10-23

## 2019-10-23 MED ORDER — BAQSIMI TWO PACK 3 MG/DOSE NA POWD
1.00 | NASAL | 1 refills | Status: DC | PRN
Start: ? — End: 2019-10-23

## 2019-10-23 MED ORDER — VITAMIN D 50 MCG (2000 UT) PO TABS
50.0000 ug | ORAL_TABLET | Freq: Every day | ORAL | Status: DC
Start: ? — End: 2019-10-23

## 2019-10-23 NOTE — Patient Instructions (Signed)
Below is a summary of information and instructions we reviewed at today's appointment. Please review this information carefully and call if you have any questions regarding it.     Diabetes information/instructions:   Labwork: You are due for lab work  on or after 06/03/19.   You should fast for 10 hours prior to having this labwork done.    Self-monitoring of blood glucose: We recommend that you monitor your blood glucose at the current frequency Please fax, email, drop off or MyChartMessage your blood sugar readings for further adjustment of regimen  if at anytime you notice too many high or low blood sugar readings  Please contact Dexcom for help with troubleshooting.    Diabetic Medication/Insulin Instructions:  Continue your current diabetic medication regimen unchanged except only take Novolog insulin before dinner 5 units for before high carb dinner..  .  .    Low Blood Sugar Prevention and Treatment:   Please carry fast-acting carbohydrate source (preferably glucose tablets) with you at all times. Please treat low blood sugar with 15 grams carbohydrate (4 glucose tablets), wait 15 minutes, and recheck sugar. Re-treat if necessary.   Please notify office for any episode of severe hypoglycemia, such as those requiring assistance from someone else.     Aspirin: Please continue taking low-dose aspirin.    Diet:   Please decrease your intake of simple sugars and starches. Please watch your portions.  Please try to avoid missing meals.    Activity:   Resume gentle exercise as your health condition allows.     Foot Care:   Please examine feet daily and appropriately treat callouses or lesions. Please avoid being barefoot or wearing of ill fitting shoes. Please use lotion as needed to treat dry skin on feet.  Please follow up with your podiatrist regarding recent foot surgery, routine foot care    Diabetic Education:   We did not discuss diabetic education today. If you would like to follow up with a diabetic  educator, please call the office and we will arrange for this    Screening for Health Complications of Diabetes:   Diabetic screening is up-to-date except for an eye exam. It is important to have your eyes examined every year. .    The following additional instructions relate to health conditions other than your diabetes:   Leg swelling: Seek medical attention ifyou develop leg pain, redness, tenderness, fever, shortness of breath   Please see kidney specialist as planned.

## 2019-10-23 NOTE — Progress Notes (Signed)
Order pended for add on

## 2019-10-23 NOTE — Progress Notes (Signed)
Endocrine Provider Progress Note      Patient Name:  [name] [[id number]] DOB: Sep 17, 1965  Date: 10/23/2019    Subjective:          [name] is a 54 y.o. male who presents for follow up of type 2 diabetes mellitus.   Since the last visit, the patient has been well overall. 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  Second dose due later this week. Patient has noticed glucose better controlled since he switched from Lantus to basaglar. He will establish with new PCP next month. He has been taking lasix daily.    Monitoring, Compliance and Complications:  Diagnosis Date: ~2000-2001  Prior Diabetic Treatment: Wilder Glade (discontinued due to renal function) -patient reports today Wilder Glade was stopped during hospitalization for osteomyelitis.   Hgb A1c at time of referral: 11.1% (done 12/20/16). A1C due now  HGB A1C goal based on age and comorbidities is 7%.  The patient has the following diabetic complications:  retinopathy, nephropathy and neuropathy, history of foot ulcer.  He has had multiple surgeries for osteomyelitis on his left foot in 2017.     Glucose Meter: Reli-On, not sure which model  Glucose Meter Validation Date: never  The patient did not bring the glucose meter or glucose readings today.  Compliance with blood glucose monitoring: fair   The patient is not currently testing home blood glucose.   Glucose readings are as follows:  -    CGM Type:  Dexcom G6  CGM use fequency:  all of the time   Calibration frequency: This CGM does not require callibration  Percent of time in target range: 81  For additional CGM data, see scanned CGM download attached to this visit note.   Average sensor glucose: 143  Percent of time above target range: 18,1  Percent of time below target range: <1   Glucose ranges 8/26-9/8 tracing in target much of the time with occasional spike around 9 pm and 3 am. Lower normal at times around 2 pm.      Injections:   Injections are given by patient.   Injections sites  include: abdominal wall  Injection compliance: The patient never misses injections.  The patient reports some bruising at injection sites. with basaglar   Will take LOG before meals for glucose >120: 3-4 units Needs breakfast or lunch dose about 1-2 times a week    Diet, Exercise, and Weight:     The patient follows no particular diet.  Compliance with diet has been variable.   Exercise: Type of exercise: walking.    Weight: Weight has increased 5 lbs since last visit.    Hypoglycemia:   The patient has experienced mild occasional hypoglycemia since the last visit. Symptoms of hypoglycemia include: hunger and shaking, blurred vision, nausea. . The patient treats hypoglycemia with carb snack    The patient has Nasal Glucagon (Baqsimi). This is up-to-date and family members have been instructed in its use.   The patient does not have a medical alert bracelet or necklace.    Diabetic Education: The patient has never received diabetes education.  Would like to see if insurance covers it now since he has not had it in the past d/t limited coverage         Diabetic Complication Screening/Treatment  Eye exam: Dr. [facility]". Last visit was 2020 "Some damage there. I assume blood vessels. He was due for f/u in Feb but has not yet followed up.  Urine microalbumin/creatinine ratio: N/A. The patient has CKD followed by nephrologist.  Dr. Fernande Boyden appt next month  Foot Care: The patient reports continued problems with  neuropathic pain Dr. Nada Boozer Notes athletes foot resolved.  Monofilament: not intact   Aspirin Therapy: The patient is currently taking aspirin at dose indicated in the medication list.   Cardiovascular risk factors: diabetes mellitus, dyslipidemia, hypertension, male gender, obesity (BMI >= 30 kg/m2), sedentary lifestyle and smoking/ tobacco exposure  Ace Inhibitor/ARB: The patient is taking ACE-I/ARB at dose indicated in medication list and is tolerating this well.  Dental care: Patient has received  regular dental care, most recently Feb-March 2021 Had 4 teeth extracted    Current Outpatient Medications   Medication Sig Dispense Refill    aspirin EC 81 MG EC tablet Take 1 tablet (81 mg total) by mouth every evening. 90 tablet 3    atorvastatin (LIPITOR) 40 MG tablet Take 1 tablet by mouth once daily 30 tablet 0    Blood Glucose Monitoring Suppl (RELION PRIME MONITOR) Device by Does not apply route USE TO TEST BLOOD GLUCOSE FOUR TIMES DAILY      Cholecalciferol (VITAMIN D) 2000 units tablet Take 2,000 Units by mouth daily      Continuous Blood Gluc Receiver (Dexcom G6 Receiver) Device 1 Device by Does not apply route daily 1 Device 0    Continuous Blood Gluc Sensor (Dexcom G6 Sensor) Misc 1 each by Does not apply route every 10 (ten) days 9 each 3    Continuous Blood Gluc Transmit (Dexcom G6 Transmitter) Misc USE TO TEST BLOOD SUGAR DAILY 1 each 0    furosemide (LASIX) 20 MG tablet Take 20 mg by mouth daily         gabapentin (NEURONTIN) 300 MG capsule Take 1 capsule (300 mg total) by mouth 3 (three) times daily 270 capsule 0    Glucagon (BAQSIMI TWO PACK) 3 MG/DOSE Powder 1 each by Nasal route as needed (severe HYPOglycemia) 1 each 1    glucose blood (RELION PRIME TEST) test strip 1 each by Other route 4 (four) times daily Use as instructed      insulin glargine (BASAGLAR KWIKPEN) 100 UNIT/ML injection pen Inject 62 Units into the skin nightly 60 mL 1    Insulin Pen Needle 31G X 6 MM Misc by Does not apply route.Use up to 7 times a day as directed      liraglutide (Victoza) 18 MG/3ML injection INJECT 1.8MG  SUBCUTANEOUSLY ONCE DAILY 9 mL 5    lisinopril (ZESTRIL) 20 MG tablet TAKE 2 TABLETS BY MOUTH NIGHTLY 180 tablet 0    Multiple Vitamin (multivitamin) capsule Take 1 capsule by mouth daily      NovoLOG FlexPen 100 UNIT/ML injection pen INJECT SUBCUTANEOUSLY PRIOR TO MEALS PER CORRECTION SCALE UP TO 54 UNITS DAILY 30 mL 5    clindamycin (CLEOCIN) 150 MG capsule Take 300 mg by mouth 3 (three)  times daily      ondansetron (ZOFRAN) 8 MG tablet Take 1 tablet (8 mg total) by mouth every 6 (six) hours as needed for Nausea 10 tablet 0     No current facility-administered medications for this visit.      Medication review with [name] suggested compliance all of the time.       Immunization History   Administered Date(s) Administered    Td 06/14/2009    Td,preservative free 06/14/2009    Tdap 07/06/2011     The following portions of the patient's history  were reviewed and updated as appropriate: current medications, past medical history and problem list.  The following information was also reviewed at today's visit: lab data    Review of Systems  As above.    Positive for edema BLE worse on left x few weeks. No calf tenderness or pain   Objective:      Vital Signs: BP 138/78    Pulse 85    Wt 114.7 kg (252 lb 12.8 oz)    BMI 40.80 kg/m  CGM glucose 71 (15 g CHO, recheck 84 gave 15 g CHO juice)  PE  Well nourished, well appearing, in no acute distress.  HEENT:  There is no stare or lid lag. There is no periorbital edema. Extraoccular movements are intact. No conjunctival injection.   Neck is supple without adenopathy.  The thyroid is normal on inspection. On palpation, the thyroid is normal except for somewhat lobular .   Lungs are clear to auscultation.  Cardiac exam reveals a regular rate and rhythm. No murmur or gallop is appreciated.   Exam of the extremities reveals 2+ pitting edema R>L No calf tenderness, redness or pain.      There is deformity of the right foot, including bunion.  Callouses are noted on bottoms of feet. Onychomycosis is noted in multiple nails on both feet.  On neurologic exam, the patient is alert and appropriate. Gait and speech are normal.   Skin is warm and dry.     Lab Review    Lab Results   Component Value Date    HGBA1CPERCNT 7.3 10/19/2019    HGBA1CPERCNT 7.9 06/06/2019    HGBA1CPERCNT 7.2 03/05/2019    TSH 1.59 09/13/2017    CHOL 214 (H) 06/06/2019    HDL 40  06/06/2019    LDL 96 06/06/2019    TRIG 391 (H) 06/06/2019    ALT 26 10/19/2019    B12 419 09/13/2017    VITD 19.6 (L) 02/14/2019    GLU 133 (H) 10/19/2019    K 4.5 10/19/2019    CA 9.4 10/19/2019    CREAT 1.76 (H) 10/19/2019    CO2 25.6 10/19/2019    EGFR 42 (L) 10/19/2019    NA 142 10/19/2019    ALKPHOS 134 05/23/2019       03/09/19 vitamin D2/D3 total 21 (normal 20-50)    Assessment:      1. Type 2 diabetes mellitus with hyperglycemia, with long-term current use of insulin  Hemoglobin A1C    Lipid panel    ALT    Basic Metabolic Panel    insulin glargine (BASAGLAR KWIKPEN) 100 UNIT/ML injection pen    Glucagon (Baqsimi Two Pack) 3 MG/DOSE Powder    liraglutide (Victoza) 18 MG/3ML injection    Continuous Blood Gluc Sensor (Dexcom G6 Sensor) Misc    Continuous Blood Gluc Transmit (Dexcom G6 Transmitter) Misc    NovoLOG FlexPen 100 UNIT/ML injection pen    DISCONTINUED: NovoLOG FlexPen 100 UNIT/ML injection pen   2. Mixed hyperlipidemia     3. Essential hypertension     4. Chronic kidney disease, unspecified CKD stage     5. Goiter  [place] Head Neck Soft Tissue   6. Vitamin D deficiency  Vitamin D3 (CHOLECALCIFEROL) 50 MCG (2000 UT) tablet     Diabetes: Diabetes is improved but still not adequately controlled on the current regimen. Rationale for improved control discussed with the patient today. Discussed specific areas of diabetes management as detailed under patient instructions below.  Discussed importance of glycemic control especially given recent surgery.  Lipids: We have reviewed that the triglycerides.We have discussed that they are likely to improve with good diabetic control.    Blood pressure: Blood pressure is well controlled on the current regimen which is well tolerated and will be continued.   Vitamin D: Normal on most recent lab testing.   Goiter: Recommend thyroid US.  Plan:   Below is a summary of information and instructions we reviewed at today's appointment. Please review this information carefully  and call if you have any questions regarding it.     Diabetes information/instructions:   Labwork: You are due for lab work  on or after 01/18/20.   You should fast for 10 hours prior to having this labwork done.    Self-monitoring of blood glucose: We recommend that you monitor your blood glucose at the current frequency Please fax, email, drop off or MyChartMessage your blood sugar readings for further adjustment of regimen  if at anytime you notice too many high or low blood sugar readings  Please contact Dexcom for help with troubleshooting.    Diabetic Medication/Insulin Instructions:  Continue your current diabetic medication regimen unchanged except only take Novolog insulin before dinner 5 units for before high carb dinner..  .  .    Low Blood Sugar Prevention and Treatment:   Please carry fast-acting carbohydrate source (preferably glucose tablets) with you at all times. Please treat low blood sugar with 15 grams carbohydrate (4 glucose tablets), wait 15 minutes, and recheck sugar. Re-treat if necessary.   Please notify office for any episode of severe hypoglycemia, such as those requiring assistance from someone else.     Aspirin: Please continue taking low-dose aspirin.    Diet:   Please decrease your intake of simple sugars and starches. Please watch your portions.  Please try to avoid missing meals.    Activity:   Resume gentle exercise as your health condition allows.     Foot Care:   Please examine feet daily and appropriately treat callouses or lesions. Please avoid being barefoot or wearing of ill fitting shoes. Please use lotion as needed to treat dry skin on feet.  Please follow up with your podiatrist regarding recent foot surgery, routine foot care    Diabetic Education:   We did not discuss diabetic education today. If you would like to follow up with a diabetic educator, please call the office and we will arrange for this    Screening for Health Complications of Diabetes:   Diabetic screening  is up-to-date except for an eye exam. It is important to have your eyes examined every year. .    The following additional instructions relate to health conditions other than your diabetes:   Leg swelling: Seek medical attention ifyou develop leg pain, redness, tenderness, fever, shortness of breath   Please see kidney specialist as planned.  You are due for thyroid ultrasound.    Return in about 3 months (around 01/22/2020).    Annamaria Boots, NP

## 2019-10-24 ENCOUNTER — Ambulatory Visit (RURAL_HEALTH_CENTER): Payer: Self-pay | Admitting: Internal Medicine

## 2019-11-12 ENCOUNTER — Ambulatory Visit: Payer: BC Managed Care – PPO

## 2019-11-14 ENCOUNTER — Ambulatory Visit: Payer: BC Managed Care – PPO

## 2019-11-18 ENCOUNTER — Ambulatory Visit
Admission: RE | Admit: 2019-11-18 | Discharge: 2019-11-18 | Disposition: A | Discharge status: Home / Self Care | Payer: BC Managed Care – PPO | Source: Ambulatory Visit | Attending: Family | Admitting: Family

## 2019-11-18 ENCOUNTER — Ambulatory Visit (RURAL_HEALTH_CENTER): Payer: Self-pay | Admitting: Internal Medicine

## 2019-11-18 ENCOUNTER — Ambulatory Visit: Payer: BC Managed Care – PPO | Attending: Internal Medicine | Admitting: Internal Medicine

## 2019-11-18 VITALS — BP 124/62 | HR 88 | Temp 98.9°F | Resp 16 | Ht 66.0 in | Wt 253.4 lb

## 2019-11-18 DIAGNOSIS — E1121 Type 2 diabetes mellitus with diabetic nephropathy: Secondary | ICD-10-CM

## 2019-11-18 DIAGNOSIS — Z794 Long term (current) use of insulin: Secondary | ICD-10-CM

## 2019-11-18 DIAGNOSIS — K8020 Calculus of gallbladder without cholecystitis without obstruction: Secondary | ICD-10-CM

## 2019-11-18 DIAGNOSIS — E782 Mixed hyperlipidemia: Secondary | ICD-10-CM

## 2019-11-18 DIAGNOSIS — R110 Nausea: Secondary | ICD-10-CM

## 2019-11-18 DIAGNOSIS — Z125 Encounter for screening for malignant neoplasm of prostate: Secondary | ICD-10-CM

## 2019-11-18 DIAGNOSIS — E049 Nontoxic goiter, unspecified: Secondary | ICD-10-CM

## 2019-11-18 DIAGNOSIS — E041 Nontoxic single thyroid nodule: Secondary | ICD-10-CM | POA: Insufficient documentation

## 2019-11-18 DIAGNOSIS — E1165 Type 2 diabetes mellitus with hyperglycemia: Secondary | ICD-10-CM

## 2019-11-18 DIAGNOSIS — I10 Essential (primary) hypertension: Secondary | ICD-10-CM

## 2019-11-18 DIAGNOSIS — N189 Chronic kidney disease, unspecified: Secondary | ICD-10-CM

## 2019-11-18 DIAGNOSIS — K219 Gastro-esophageal reflux disease without esophagitis: Secondary | ICD-10-CM

## 2019-11-18 DIAGNOSIS — Z1211 Encounter for screening for malignant neoplasm of colon: Secondary | ICD-10-CM

## 2019-11-18 NOTE — Progress Notes (Signed)
[facility]   INTERNAL MEDICINE PROGRESS NOTE  Date: 11/18/2019  Time: 3:33 PM  Patient Name: [name], [name]  Date of Birth: 04-30-1965  Age: 54 y.o.   PCP: Tanja Port, MD    CHIEF COMPLAINT:   Establish Care         HISTORY OF PRESENT ILLNESS:   [name] is a 54 y.o. male with Mhx of DM2, HTN, Hypercholesterolemia, GERD, Gallstones, CKD stage III and Neuropathy.    Pt with GERD. Pt complains of intermittent acid reflux. Pt has nausea on-off which has been present for years. AT present on abdominal pain.    Pt comes for follow up of above chronic medical issues.    PAST MEDICAL HISTORY:     Past Medical History:   Diagnosis Date    Abnormal vision     Complication of anesthesia     last time he was kicking his legs had to wake him up    Diabetes mellitus     Diabetic ulcer of left foot 12/26/14    Disc     End-stage renal disease 2021    Stage 3 Kidney Disease    Gastroesophageal reflux disease     Hyperlipidemia     Hypertension     Seasonal allergic rhinitis     Sleep apnea     not tested    Type 2 diabetes mellitus, controlled        PAST SURGICAL HISTORY:     Past Surgical History:   Procedure Laterality Date    ARTHRODESIS, TOE Left 05/04/2015    Procedure: ARTHRODESIS, TOE;  Surgeon: Janora Norlander, DPM;  Location: [facility];  Service: Podiatry;  Laterality: Left;  1st MPJ FUSION    DEBRIDEMENT & IRRIGATION, LOWER EXTREMITY Left 03/12/2015    Procedure: [place], LOWER EXTREMITY;  Surgeon: Janora Norlander, DPM;  [facility];  Service: Podiatry;  Laterality: Left;  I&D LEFT FOOT FOR BONE INFECTION    DEBRIDEMENT & IRRIGATION, LOWER EXTREMITY Left 03/07/2016    Procedure: DEBRIDEMENT & IRRIGATION, LEFT FOOT 2ND TOE, EXCISION OF 2ND METATARSEL;  Surgeon: Janora Norlander, DPM;  [facility];  Service: Podiatry;  Laterality: Left;  I&D Left Foot    HAND SURGERY      SESAMOIDECTOMY Left 05/24/2019     Procedure: SESAMOIDECTOMY;  Surgeon: [name], DPM;  Location: [place];  Service: Podiatry;  Laterality: Left;       SOCIAL HISTORY:     Social History     Socioeconomic History    Marital status: Single     Spouse name: Not on file    Number of children: Not on file    Years of education: Not on file    Highest education level: Not on file   Occupational History    Not on file   Tobacco Use    Smoking status: Former Smoker     Packs/day: 0.25     Types: Cigarettes     Quit date: 09/06/2017     Years since quitting: 2.2    Smokeless tobacco: Never Used    Tobacco comment: smokes at work, not at home   Vaping Use    Vaping Use: Never used   Substance and Sexual Activity    Alcohol use: No     Alcohol/week: 0.0 standard drinks     Comment: in past    Drug use: No     Comment:  in past    Sexual activity: Yes     Partners: Female     Birth control/protection: None   Other Topics Concern    Not on file   Social History Narrative    Not on file     Social Determinants of Health     Financial Resource Strain:     Difficulty of Paying Living Expenses:    Food Insecurity:     Worried About Charity fundraiser in the Last Year:     Arboriculturist in the Last Year:    Transportation Needs:     Film/video editor (Medical):     Lack of Transportation (Non-Medical):    Physical Activity:     Days of Exercise per Week:     Minutes of Exercise per Session:    Stress:     Feeling of Stress :    Social Connections:     Frequency of Communication with Friends and Family:     Frequency of Social Gatherings with Friends and Family:     Attends Religious Services:     Active Member of Clubs or Organizations:     Attends Music therapist:     Marital Status:    Intimate Partner Violence:     Fear of Current or Ex-Partner:     Emotionally Abused:     Physically Abused:     Sexually Abused:        FAMILY HISTORY:     Family History   Problem Relation Age of Onset     Diabetes Mother     Lung cancer Mother     Cancer Mother         lung    Depression Sister     COPD Sister     Cancer Sister         breast    Hypertension Brother       he He indicated that his mother is alive. He indicated that his father is deceased. He indicated that his sister is alive. He indicated that his brother is alive.       IMMUNIZATION HISTORY:     Immunization History   Administered Date(s) Administered    Td 06/14/2009    Td,preservative free 06/14/2009    Tdap 07/06/2011       HEALTH MAINTENANCE:      Health Maintenance   Topic    Advance Directive on File     COLONOSCOPY TEN YEARS     COVID-19 Vaccine (1)    HIGH RISK PNEUMONIA     Annual Exam     ASPIRIN THERAPY ASSESSMENT     DM OPHTHALMOLOGY EXAM     URINE MICROALBUMIN     Tetanus Ten-Year     DEPRESSION SCREENING     HEMOGLOBIN A1C ANNUAL     HEPATITIS C SCREENING     Shingrix Vaccine 50+     INFLUENZA VACCINE         ALLERGIES:     Allergies   Allergen Reactions    Flu Virus Vaccine Anaphylaxis       MEDICATIONS:     Current Outpatient Medications:     aspirin EC 81 MG EC tablet, Take 1 tablet (81 mg total) by mouth every evening., Disp: 90 tablet, Rfl: 3    atorvastatin (LIPITOR) 40 MG tablet, Take 1 tablet by mouth once daily, Disp: 30 tablet, Rfl: 0    Blood Glucose Monitoring Suppl (RELION  PRIME MONITOR) Device, by Does not apply route USE TO TEST BLOOD GLUCOSE FOUR TIMES DAILY, Disp: , Rfl:     Continuous Blood Gluc Receiver (Dexcom G6 Receiver) Device, 1 Device by Does not apply route daily, Disp: 1 Device, Rfl: 0    Continuous Blood Gluc Sensor (Dexcom G6 Sensor) Misc, 1 each by Does not apply route every 10 (ten) days, Disp: 9 each, Rfl: 3    Continuous Blood Gluc Transmit (Dexcom G6 Transmitter) Misc, Inject 1 Device into the skin daily, Disp: 1 each, Rfl: 0    furosemide (LASIX) 20 MG tablet, Take 20 mg by mouth daily  , Disp: , Rfl:     gabapentin (NEURONTIN) 300 MG capsule, Take 1 capsule (300 mg  total) by mouth 3 (three) times daily, Disp: 270 capsule, Rfl: 0    glucose blood (RELION PRIME TEST) test strip, 1 each by Other route 4 (four) times daily Use as instructed, Disp: , Rfl:     insulin glargine (BASAGLAR KWIKPEN) 100 UNIT/ML injection pen, Inject 62 Units into the skin nightly, Disp: 60 mL, Rfl: 1    Insulin Pen Needle 31G X 6 MM Misc, by Does not apply route.Use up to 7 times a day as directed, Disp: , Rfl:     liraglutide (Victoza) 18 MG/3ML injection, INJECT 1.8MG  SUBCUTANEOUSLY ONCE DAILY, Disp: 9 mL, Rfl: 5    lisinopril (ZESTRIL) 20 MG tablet, TAKE 2 TABLETS BY MOUTH NIGHTLY, Disp: 180 tablet, Rfl: 0    Multiple Vitamin (multivitamin) capsule, Take 1 capsule by mouth daily, Disp: , Rfl:     NovoLOG FlexPen 100 UNIT/ML injection pen, INJECT SUBCUTANEOUSLY PRIOR TO MEALS PER CORRECTION SCALE UP TO 10 UNITS DAILY, Disp: 15 mL, Rfl: 3    ondansetron (ZOFRAN) 8 MG tablet, Take 1 tablet (8 mg total) by mouth every 6 (six) hours as needed for Nausea, Disp: 10 tablet, Rfl: 0    Vitamin D3 (CHOLECALCIFEROL) 50 MCG (2000 UT) tablet, Take 1 tablet (50 mcg total) by mouth daily, Disp: , Rfl:     Glucagon (Baqsimi Two Pack) 3 MG/DOSE Powder, 1 each by Nasal route as needed (severe HYPOglycemia), Disp: 1 each, Rfl: 1     REVIEW OF SYSTEMS:   All systems were reviewed and are negative unless pertinent positives stated in HPI.    PHYSICAL EXAM:     Vitals:    11/18/19 1507   BP: 124/62   Pulse: 88   Resp: 16   Temp: 98.9 F (37.2 C)   SpO2: 96%     Body mass index is 40.9 kg/m.    GENERAL: Pt alert, no acute distress.   HEENT:  Head is atraumatic and normocephalic.   NECK: Supple  LUNGS: no rales, no ronchi, no wheezing.   HEART: Regular rate, regular rhythm.   ABDOMEN: BS+, no distension, soft, no tenderness, no guarding.   EXTREMITIES: No leg edema  NEUROLOGICAL: Alert, moving all extremities, no gross deficit noted.  PSYCHIATRIC: Mood and affect are appropriate.    LABS:   No results found for  this or any previous visit (from the past 336 hour(s)).    RADIOLOGY:    No new results    ASSESSMENT AND PLAN:       # DM2  - Hgb A1c was 7.3 on 10/19/2019.  - Pt on Insulin basaglar, insulin novolog per correction scale before meals and Victoza.  - Treatment per endocrinology.    # HTN  - Stable  - Continue  lisinopril and lasix    # Hypercholesterolemia  - Continue lipitor  - FLP ordered by endocrinology. F/U results.    # CKD stage III, Proteinuria  - Pt following with nephrologist    # Neuropathy  - Continue gabapentin    # GERD, Intermittent nausea  - Give PPI  - Refer to surgery to evaluate for EGD    # Gallstones  - Pt previously referred to general surgery by Dr. Vickki Muff. Pt has not been seen. WIll place new referral.    # Colon cancer screening  - Pt previously referred to general surgery by Dr. Vickki Muff. Pt has not been seen. WIll place new referral.                Tanja Port, MD

## 2019-11-21 ENCOUNTER — Other Ambulatory Visit (RURAL_HEALTH_CENTER): Payer: Self-pay | Admitting: Family Medicine

## 2019-11-21 DIAGNOSIS — I10 Essential (primary) hypertension: Secondary | ICD-10-CM

## 2019-11-24 NOTE — Progress Notes (Signed)
Please call the patient with the following information:  Ultrasound showed his thyroid was normal in size and just had a tiny benign cyst on the right.  No further ultrasound follow-up is needed unless there is a change in his physical exam in the future.  This note was generated using speech recognition software and may contain unintended errors in grammar, spelling or content. Please ask me for clarification if questions about its content prior to calling patient.

## 2019-11-27 ENCOUNTER — Encounter (RURAL_HEALTH_CENTER): Payer: Self-pay

## 2019-12-04 ENCOUNTER — Encounter (RURAL_HEALTH_CENTER): Payer: Self-pay

## 2019-12-04 NOTE — Progress Notes (Signed)
certified letter confirmation

## 2019-12-12 ENCOUNTER — Other Ambulatory Visit (RURAL_HEALTH_CENTER): Payer: Self-pay

## 2019-12-12 DIAGNOSIS — I10 Essential (primary) hypertension: Secondary | ICD-10-CM

## 2019-12-12 MED ORDER — LISINOPRIL 20 MG PO TABS
40.0000 mg | ORAL_TABLET | Freq: Every evening | ORAL | 3 refills | Status: DC
Start: 2019-12-12 — End: 2020-03-19

## 2019-12-12 NOTE — Telephone Encounter (Signed)
Pt requesting refill of Lisinopril.

## 2020-01-10 ENCOUNTER — Other Ambulatory Visit (RURAL_HEALTH_CENTER): Payer: Self-pay | Admitting: Internal Medicine

## 2020-01-10 DIAGNOSIS — E781 Pure hyperglyceridemia: Secondary | ICD-10-CM

## 2020-01-10 NOTE — Telephone Encounter (Signed)
Pt LVM requesting refill on atorvastatin    Pending atorvastatin and routing to PCP.

## 2020-01-13 MED ORDER — ATORVASTATIN CALCIUM 40 MG PO TABS
40.0000 mg | ORAL_TABLET | Freq: Every day | ORAL | 1 refills | Status: DC
Start: 2020-01-13 — End: 2020-03-05

## 2020-01-20 ENCOUNTER — Ambulatory Visit (RURAL_HEALTH_CENTER): Payer: Self-pay | Admitting: Internal Medicine

## 2020-01-28 ENCOUNTER — Ambulatory Visit (RURAL_HEALTH_CENTER): Payer: Self-pay | Admitting: Family

## 2020-01-29 ENCOUNTER — Ambulatory Visit (RURAL_HEALTH_CENTER): Payer: BC Managed Care – PPO | Admitting: Internal Medicine

## 2020-02-03 ENCOUNTER — Ambulatory Visit (RURAL_HEALTH_CENTER): Payer: Self-pay | Admitting: Internal Medicine

## 2020-02-11 ENCOUNTER — Encounter: Payer: Self-pay | Admitting: Foot and Ankle Surgery

## 2020-02-11 DIAGNOSIS — M14671 Charcot's joint, right ankle and foot: Secondary | ICD-10-CM

## 2020-02-17 ENCOUNTER — Other Ambulatory Visit (RURAL_HEALTH_CENTER): Payer: Self-pay

## 2020-02-17 ENCOUNTER — Ambulatory Visit (RURAL_HEALTH_CENTER): Payer: BC Managed Care – PPO | Admitting: Family

## 2020-02-17 ENCOUNTER — Ambulatory Visit: Payer: BC Managed Care – PPO

## 2020-02-17 DIAGNOSIS — Z794 Long term (current) use of insulin: Secondary | ICD-10-CM

## 2020-02-17 DIAGNOSIS — E1140 Type 2 diabetes mellitus with diabetic neuropathy, unspecified: Secondary | ICD-10-CM

## 2020-02-17 MED ORDER — GABAPENTIN 300 MG PO CAPS
300.0000 mg | ORAL_CAPSULE | Freq: Three times a day (TID) | ORAL | 5 refills | Status: DC
Start: 2020-02-17 — End: 2021-02-05

## 2020-02-17 NOTE — Telephone Encounter (Signed)
Pt requesting refill of Gabapentin

## 2020-02-20 ENCOUNTER — Ambulatory Visit
Admission: RE | Admit: 2020-02-20 | Discharge: 2020-02-20 | Disposition: A | Discharge status: Home / Self Care | Payer: BC Managed Care – PPO | Source: Ambulatory Visit | Attending: Foot and Ankle Surgery | Admitting: Foot and Ankle Surgery

## 2020-02-20 DIAGNOSIS — E1140 Type 2 diabetes mellitus with diabetic neuropathy, unspecified: Secondary | ICD-10-CM | POA: Insufficient documentation

## 2020-02-20 DIAGNOSIS — M14671 Charcot's joint, right ankle and foot: Secondary | ICD-10-CM

## 2020-02-20 DIAGNOSIS — E11610 Type 2 diabetes mellitus with diabetic neuropathic arthropathy: Secondary | ICD-10-CM | POA: Insufficient documentation

## 2020-02-24 NOTE — Progress Notes (Deleted)
Endocrine Provider Progress Note      Patient Name:  [name] [[id number]] DOB: [birth year]  Date: 02/24/2020    Subjective:          [name] is a 55 y.o. male who presents for follow up of type 2 diabetes mellitus.   Since the last visit, the patient has been well overall. 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  Second dose due later this week. Patient has noticed glucose better controlled since he switched from Lantus to basaglar. He will establish with new PCP next month. He has been taking lasix daily.    Monitoring, Compliance and Complications:  Diagnosis Date: ~2000-2001  Prior Diabetic Treatment: Wilder Glade (discontinued due to renal function) -patient reports today Wilder Glade was stopped during hospitalization for osteomyelitis.   Hgb A1c at time of referral: 11.1% (done 12/20/16). A1C due now  HGB A1C goal based on age and comorbidities is 7%.  The patient has the following diabetic complications:  retinopathy, nephropathy and neuropathy, history of foot ulcer.  He has had multiple surgeries for osteomyelitis on his left foot in 2017.     Glucose Meter: Reli-On, not sure which model  Glucose Meter Validation Date: never  The patient did not bring the glucose meter or glucose readings today.  Compliance with blood glucose monitoring: fair   The patient is not currently testing home blood glucose.   Glucose readings are as follows:  -    CGM Type:  Dexcom G6  CGM use fequency:  all of the time   Calibration frequency: This CGM does not require callibration  Percent of time in target range: 81  For additional CGM data, see scanned CGM download attached to this visit note.   Average sensor glucose: 143  Percent of time above target range: 18,1  Percent of time below target range: <1   Glucose ranges 8/26-9/8 tracing in target much of the time with occasional spike around 9 pm and 3 am. Lower normal at times around 2 pm.      Injections:   Injections are given by patient.   Injections  sites include: abdominal wall  Injection compliance: The patient never misses injections.  The patient reports some bruising at injection sites. with basaglar   Will take LOG before meals for glucose >120: 3-4 units Needs breakfast or lunch dose about 1-2 times a week    Diet, Exercise, and Weight:     The patient follows no particular diet.  Compliance with diet has been variable.   Exercise: Type of exercise: walking.    Weight: Weight has increased 5 lbs since last visit.    Hypoglycemia:   The patient has experienced mild occasional hypoglycemia since the last visit. Symptoms of hypoglycemia include: hunger and shaking, blurred vision, nausea. . The patient treats hypoglycemia with carb snack    The patient has Nasal Glucagon (Baqsimi). This is up-to-date and family members have been instructed in its use.   The patient does not have a medical alert bracelet or necklace.    Diabetic Education: The patient has never received diabetes education.  Would like to see if insurance covers it now since he has not had it in the past d/t limited coverage         Diabetic Complication Screening/Treatment  Eye exam: Dr. [facility]". Last visit was 2020 "Some damage there. I assume blood vessels. He was due for f/u in Feb but has not yet followed up.  Urine microalbumin/creatinine ratio: N/A. The patient has CKD followed by nephrologist.  Dr. Fernande Boyden appt next month  Foot Care: The patient reports continued problems with  neuropathic pain Dr. Nada Boozer Notes athletes foot resolved.  Monofilament: not intact   Aspirin Therapy: The patient is currently taking aspirin at dose indicated in the medication list.   Cardiovascular risk factors: diabetes mellitus, dyslipidemia, hypertension, male gender, obesity (BMI >= 30 kg/m2), sedentary lifestyle and smoking/ tobacco exposure  Ace Inhibitor/ARB: The patient is taking ACE-I/ARB at dose indicated in medication list and is tolerating this well.  Dental care: Patient has received  regular dental care, most recently Feb-March 2021 Had 4 teeth extracted    Current Outpatient Medications   Medication Sig Dispense Refill    aspirin EC 81 MG EC tablet Take 1 tablet (81 mg total) by mouth every evening. 90 tablet 3    atorvastatin (LIPITOR) 40 MG tablet Take 1 tablet (40 mg total) by mouth daily 90 tablet 1    Blood Glucose Monitoring Suppl (RELION PRIME MONITOR) Device by Does not apply route USE TO TEST BLOOD GLUCOSE FOUR TIMES DAILY      Continuous Blood Gluc Receiver (Dexcom G6 Receiver) Device 1 Device by Does not apply route daily 1 Device 0    Continuous Blood Gluc Sensor (Dexcom G6 Sensor) Misc 1 each by Does not apply route every 10 (ten) days 9 each 3    Continuous Blood Gluc Transmit (Dexcom G6 Transmitter) Misc Inject 1 Device into the skin daily 1 each 0    furosemide (LASIX) 20 MG tablet Take 20 mg by mouth daily         gabapentin (NEURONTIN) 300 MG capsule Take 1 capsule (300 mg total) by mouth 3 (three) times daily 90 capsule 5    Glucagon (Baqsimi Two Pack) 3 MG/DOSE Powder 1 each by Nasal route as needed (severe HYPOglycemia) 1 each 1    glucose blood (RELION PRIME TEST) test strip 1 each by Other route 4 (four) times daily Use as instructed      insulin glargine (BASAGLAR KWIKPEN) 100 UNIT/ML injection pen Inject 62 Units into the skin nightly 60 mL 1    Insulin Pen Needle 31G X 6 MM Misc by Does not apply route.Use up to 7 times a day as directed      liraglutide (Victoza) 18 MG/3ML injection INJECT 1.8MG  SUBCUTANEOUSLY ONCE DAILY 9 mL 5    lisinopril (ZESTRIL) 20 MG tablet Take 2 tablets (40 mg total) by mouth nightly 180 tablet 3    Multiple Vitamin (multivitamin) capsule Take 1 capsule by mouth daily      NovoLOG FlexPen 100 UNIT/ML injection pen INJECT SUBCUTANEOUSLY PRIOR TO MEALS PER CORRECTION SCALE UP TO 10 UNITS DAILY 15 mL 3    ondansetron (ZOFRAN) 8 MG tablet Take 1 tablet (8 mg total) by mouth every 6 (six) hours as needed for Nausea 10 tablet 0     Vitamin D3 (CHOLECALCIFEROL) 50 MCG (2000 UT) tablet Take 1 tablet (50 mcg total) by mouth daily       No current facility-administered medications for this visit.      Medication review with [name] suggested compliance all of the time.       Immunization History   Administered Date(s) Administered    Td 06/14/2009    Td,preservative free 06/14/2009    Tdap 07/06/2011     The following portions of the patient's history were reviewed and updated as appropriate: current medications,  past medical history and problem list.  The following information was also reviewed at today's visit: lab data    Review of Systems  As above.    Positive for edema BLE worse on left x few weeks. No calf tenderness or pain   Objective:      Vital Signs: There were no vitals taken for this visit. CGM glucose 71 (15 g CHO, recheck 84 gave 15 g CHO juice)  PE  Well nourished, well appearing, in no acute distress.  HEENT:  There is no stare or lid lag. There is no periorbital edema. Extraoccular movements are intact. No conjunctival injection.   Neck is supple without adenopathy.  The thyroid is normal on inspection. On palpation, the thyroid is normal except for somewhat lobular .   Lungs are clear to auscultation.  Cardiac exam reveals a regular rate and rhythm. No murmur or gallop is appreciated.   Exam of the extremities reveals 2+ pitting edema R>L No calf tenderness, redness or pain.      There is deformity of the right foot, including bunion.  Callouses are noted on bottoms of feet. Onychomycosis is noted in multiple nails on both feet.  On neurologic exam, the patient is alert and appropriate. Gait and speech are normal.   Skin is warm and dry.     Lab Review    Lab Results   Component Value Date    HGBA1CPERCNT 7.3 10/19/2019    HGBA1CPERCNT 7.9 06/06/2019    HGBA1CPERCNT 7.2 03/05/2019    TSH 1.33 10/19/2019    CHOL 214 (H) 06/06/2019    HDL 40 06/06/2019    LDL 96 06/06/2019    TRIG 391 (H) 06/06/2019    ALT 26  10/19/2019    B12 419 09/13/2017    VITD 19.6 (L) 02/14/2019    GLU 133 (H) 10/19/2019    K 4.5 10/19/2019    CA 9.4 10/19/2019    CREAT 1.76 (H) 10/19/2019    CO2 25.6 10/19/2019    EGFR 42 (L) 10/19/2019    NA 142 10/19/2019    ALKPHOS 134 05/23/2019       03/09/19 vitamin D2/D3 total 21 (normal 20-50)    Assessment:      No diagnosis found.  Diabetes: Diabetes is improved but still not adequately controlled on the current regimen. Rationale for improved control discussed with the patient today. Discussed specific areas of diabetes management as detailed under patient instructions below.   Discussed importance of glycemic control especially given recent surgery.  Lipids: We have reviewed that the triglycerides.We have discussed that they are likely to improve with good diabetic control.    Blood pressure: Blood pressure is well controlled on the current regimen which is well tolerated and will be continued.   Vitamin D: Normal on most recent lab testing.   Goiter: Recommend thyroid US.  Plan:   Below is a summary of information and instructions we reviewed at today's appointment. Please review this information carefully and call if you have any questions regarding it.     Diabetes information/instructions:   Labwork: You are due for lab work  on or after 01/18/20.   You should fast for 10 hours prior to having this labwork done.    Self-monitoring of blood glucose: We recommend that you monitor your blood glucose at the current frequency Please fax, email, drop off or MyChartMessage your blood sugar readings for further adjustment of regimen  if at anytime you notice too many  high or low blood sugar readings  Please contact Dexcom for help with troubleshooting.    Diabetic Medication/Insulin Instructions:  Continue your current diabetic medication regimen unchanged except only take Novolog insulin before dinner 5 units for before high carb dinner..  .  .    Low Blood Sugar Prevention and Treatment:   Please carry  fast-acting carbohydrate source (preferably glucose tablets) with you at all times. Please treat low blood sugar with 15 grams carbohydrate (4 glucose tablets), wait 15 minutes, and recheck sugar. Re-treat if necessary.   Please notify office for any episode of severe hypoglycemia, such as those requiring assistance from someone else.     Aspirin: Please continue taking low-dose aspirin.    Diet:   Please decrease your intake of simple sugars and starches. Please watch your portions.  Please try to avoid missing meals.    Activity:   Resume gentle exercise as your health condition allows.     Foot Care:   Please examine feet daily and appropriately treat callouses or lesions. Please avoid being barefoot or wearing of ill fitting shoes. Please use lotion as needed to treat dry skin on feet.  Please follow up with your podiatrist regarding recent foot surgery, routine foot care    Diabetic Education:   We did not discuss diabetic education today. If you would like to follow up with a diabetic educator, please call the office and we will arrange for this    Screening for Health Complications of Diabetes:   Diabetic screening is up-to-date except for an eye exam. It is important to have your eyes examined every year. .    The following additional instructions relate to health conditions other than your diabetes:   Leg swelling: Seek medical attention ifyou develop leg pain, redness, tenderness, fever, shortness of breath   Please see kidney specialist as planned.  You are due for thyroid ultrasound.    No follow-ups on file.    Maricela Bo, NP

## 2020-02-26 ENCOUNTER — Ambulatory Visit (RURAL_HEALTH_CENTER): Payer: BC Managed Care – PPO | Admitting: Family

## 2020-02-26 ENCOUNTER — Encounter (RURAL_HEALTH_CENTER): Payer: Self-pay | Admitting: Family

## 2020-03-05 ENCOUNTER — Encounter (RURAL_HEALTH_CENTER): Payer: Self-pay | Admitting: Internal Medicine

## 2020-03-05 ENCOUNTER — Ambulatory Visit: Payer: BC Managed Care – PPO | Attending: Internal Medicine | Admitting: Internal Medicine

## 2020-03-05 ENCOUNTER — Other Ambulatory Visit (RURAL_HEALTH_CENTER): Payer: Self-pay | Admitting: Internal Medicine

## 2020-03-05 VITALS — BP 160/88 | HR 95 | Temp 98.7°F | Resp 16 | Ht 66.0 in | Wt 253.0 lb

## 2020-03-05 DIAGNOSIS — I10 Essential (primary) hypertension: Secondary | ICD-10-CM

## 2020-03-05 DIAGNOSIS — E781 Pure hyperglyceridemia: Secondary | ICD-10-CM

## 2020-03-05 DIAGNOSIS — Z01818 Encounter for other preprocedural examination: Secondary | ICD-10-CM

## 2020-03-05 NOTE — Progress Notes (Addendum)
[facility]   INTERNAL MEDICINE PROGRESS NOTE  Date: 03/05/2020  Time: 4:29 PM  Patient Name: [name], [name]  Date of Birth: [birth year]  Age: 55 y.o.   PCP: Tanja Port, MD    CHIEF COMPLAINT:   Pre Surgical Clearance         HISTORY OF PRESENT ILLNESS:   [name] is a 55 y.o. male with Mhx of DM2, HTN, Hypercholesterolemia, GERD, Gallstones, CKD stage III and Neuropathy.      Pt following with podiatry at [facility] for right foot charcot arthropathy. Podiatry planning right foot surgery. Pt comes for preop evaluation. Pt with decreased mobility due to the right foot pain. Pt walks around the house with a cane. Pt goes out to the stores and sits in an electric car to do his shopping. Pt denies chest pain and shortness of breath at rest or exertion.        PAST MEDICAL HISTORY:     Past Medical History:   Diagnosis Date    Abnormal vision     Complication of anesthesia     last time he was kicking his legs had to wake him up    Diabetes mellitus     Diabetic ulcer of left foot 12/26/14    Disc     End-stage renal disease 2021    Stage 3 Kidney Disease    Gastroesophageal reflux disease     Hyperlipidemia     Hypertension     Seasonal allergic rhinitis     Sleep apnea     not tested    Type 2 diabetes mellitus, controlled        PAST SURGICAL HISTORY:     Past Surgical History:   Procedure Laterality Date    ARTHRODESIS, TOE Left 05/04/2015    Procedure: ARTHRODESIS, TOE;  Surgeon: Janora Norlander, DPM;  Location: [facility];  Service: Podiatry;  Laterality: Left;  1st MPJ FUSION    DEBRIDEMENT & IRRIGATION, LOWER EXTREMITY Left 03/12/2015    Procedure: Pearl, LOWER EXTREMITY;  Surgeon: Janora Norlander, DPM;  [facility];  Service: Podiatry;  Laterality: Left;  I&D LEFT FOOT FOR BONE INFECTION    DEBRIDEMENT & IRRIGATION, LOWER EXTREMITY Left 03/07/2016    Procedure: DEBRIDEMENT & IRRIGATION, LEFT FOOT 2ND TOE, EXCISION OF 2ND  METATARSEL;  Surgeon: Janora Norlander, DPM;  [facility];  Service: Podiatry;  Laterality: Left;  I&D Left Foot    HAND SURGERY      SESAMOIDECTOMY Left 05/24/2019    Procedure: SESAMOIDECTOMY;  Surgeon: [name], DPM;  Location: [place];  Service: Podiatry;  Laterality: Left;       SOCIAL HISTORY:     Social History     Socioeconomic History    Marital status: Single     Spouse name: None    Number of children: None    Years of education: None    Highest education level: None   Occupational History    None   Tobacco Use    Smoking status: Former Smoker     Packs/day: 0.25     Types: Cigarettes     Quit date: 09/06/2017     Years since quitting: 2.4    Smokeless tobacco: Never Used    Tobacco comment: smokes at work, not at home   Vaping Use    Vaping Use: Never used   Substance and Sexual Activity    Alcohol use:  No     Alcohol/week: 0.0 standard drinks     Comment: in past    Drug use: No     Comment: in past    Sexual activity: Yes     Partners: Female     Birth control/protection: None   Other Topics Concern    None   Social History Narrative    None     Social Determinants of Health     Financial Resource Strain:     Difficulty of Paying Living Expenses: Not on file   Food Insecurity:     Worried About Charity fundraiser in the Last Year: Not on file    [employer] of Food in the Last Year: Not on file   Transportation Needs:     Lack of Transportation (Medical): Not on file    Lack of Transportation (Non-Medical): Not on file   Physical Activity:     Days of Exercise per Week: Not on file    Minutes of Exercise per Session: Not on file   Stress:     Feeling of Stress : Not on file   Social Connections:     Frequency of Communication with Friends and Family: Not on file    Frequency of Social Gatherings with Friends and Family: Not on file    Attends Religious Services: Not on file    Active Member of Clubs or Organizations: Not on file    Attends  Archivist Meetings: Not on file    Marital Status: Not on file   Intimate Partner Violence:     Fear of Current or Ex-Partner: Not on file    Emotionally Abused: Not on file    Physically Abused: Not on file    Sexually Abused: Not on file   Housing Stability:     Unable to Pay for Housing in the Last Year: Not on file    Number of [place] in the Last Year: Not on file    Unstable Housing in the Last Year: Not on file       FAMILY HISTORY:     Family History   Problem Relation Age of Onset    Diabetes Mother     Lung cancer Mother     Cancer Mother         lung    Depression Sister     COPD Sister     Cancer Sister         breast    Hypertension Brother       he He indicated that his mother is alive. He indicated that his father is deceased. He indicated that his sister is alive. He indicated that his brother is alive.       IMMUNIZATION HISTORY:     Immunization History   Administered Date(s) Administered    Td 06/14/2009    Td,preservative free 06/14/2009    Tdap 07/06/2011       HEALTH MAINTENANCE:      Health Maintenance   Topic    Colorectal Cancer Screening     Advance Directive on File     COVID-19 Vaccine (1)    HIGH RISK PNEUMONIA     Annual Exam     ASPIRIN THERAPY ASSESSMENT     DM OPHTHALMOLOGY EXAM     URINE MICROALBUMIN     Tetanus Ten-Year     DEPRESSION SCREENING     HEMOGLOBIN A1C ANNUAL     HEPATITIS C SCREENING  Shingrix Vaccine 50+     INFLUENZA VACCINE         ALLERGIES:     Allergies   Allergen Reactions    Influenza Virus Vaccine Anaphylaxis       MEDICATIONS:     Current Outpatient Medications:     aspirin EC 81 MG EC tablet, Take 1 tablet (81 mg total) by mouth every evening., Disp: 90 tablet, Rfl: 3    atorvastatin (LIPITOR) 40 MG tablet, TAKE 1 TABLET BY MOUTH ONCE DAILY, Disp: 90 tablet, Rfl: 2    Blood Glucose Monitoring Suppl (RELION PRIME MONITOR) Device, by Does not apply route USE TO TEST BLOOD GLUCOSE FOUR TIMES DAILY, Disp: ,  Rfl:     Continuous Blood Gluc Receiver (Dexcom G6 Receiver) Device, 1 Device by Does not apply route daily, Disp: 1 Device, Rfl: 0    Continuous Blood Gluc Sensor (Dexcom G6 Sensor) Misc, 1 each by Does not apply route every 10 (ten) days, Disp: 9 each, Rfl: 3    Continuous Blood Gluc Transmit (Dexcom G6 Transmitter) Misc, Inject 1 Device into the skin daily, Disp: 1 each, Rfl: 0    furosemide (LASIX) 20 MG tablet, Take 20 mg by mouth daily  , Disp: , Rfl:     gabapentin (NEURONTIN) 300 MG capsule, Take 1 capsule (300 mg total) by mouth 3 (three) times daily, Disp: 90 capsule, Rfl: 5    Glucagon (Baqsimi Two Pack) 3 MG/DOSE Powder, 1 each by Nasal route as needed (severe HYPOglycemia), Disp: 1 each, Rfl: 1    glucose blood (RELION PRIME TEST) test strip, 1 each by Other route 4 (four) times daily Use as instructed, Disp: , Rfl:     insulin glargine (BASAGLAR KWIKPEN) 100 UNIT/ML injection pen, Inject 62 Units into the skin nightly, Disp: 60 mL, Rfl: 1    Insulin Pen Needle 31G X 6 MM Misc, by Does not apply route.Use up to 7 times a day as directed, Disp: , Rfl:     liraglutide (Victoza) 18 MG/3ML injection, INJECT 1.'8MG'$  SUBCUTANEOUSLY ONCE DAILY, Disp: 9 mL, Rfl: 5    lisinopril (ZESTRIL) 20 MG tablet, Take 2 tablets (40 mg total) by mouth nightly, Disp: 180 tablet, Rfl: 3    Multiple Vitamin (multivitamin) capsule, Take 1 capsule by mouth daily, Disp: , Rfl:     NovoLOG FlexPen 100 UNIT/ML injection pen, INJECT SUBCUTANEOUSLY PRIOR TO MEALS PER CORRECTION SCALE UP TO 10 UNITS DAILY, Disp: 15 mL, Rfl: 3    ondansetron (ZOFRAN) 8 MG tablet, Take 1 tablet (8 mg total) by mouth every 6 (six) hours as needed for Nausea, Disp: 10 tablet, Rfl: 0    Vitamin D3 (CHOLECALCIFEROL) 50 MCG (2000 UT) tablet, Take 1 tablet (50 mcg total) by mouth daily, Disp: , Rfl:      REVIEW OF SYSTEMS:   All systems were reviewed and are negative unless pertinent positives stated in HPI.    PHYSICAL EXAM:     Vitals:     03/05/20 1620   BP: 160/88   Pulse: 95   Resp: 16   Temp: 98.7 F (37.1 C)   SpO2: 97%     Body mass index is 40.84 kg/m.  Manual BP= 140/80  GENERAL: Pt alert, no acute distress.   HEENT:  Head is atraumatic and normocephalic.   NECK: Supple  LUNGS: no rales, no ronchi, no wheezing.   HEART: Regular rate, regular rhythm.   ABDOMEN: soft, no tenderness, no guarding.   EXTREMITIES:  No leg edema  NEUROLOGICAL: Alert, moving all extremities  PSYCHIATRIC: Mood and affect are appropriate.    LABS:   No results found for this or any previous visit (from the past 336 hour(s)).    RADIOLOGY:   No results found.       ASSESSMENT AND PLAN:         # Preop evaluation for right foot charcot arthropathy surgery  - EKG= NSR HR of 88  - Order CMP, CBC and PT/PTT  - Refer to cardiology for cardiac clearance.    # Right foot charcot arthropathy  - F/U with podiatry    # DM2  - Pt on Insulin basaglar, insulin novolog per correction scale before meals and Victoza.  - Keep appt with endocrinology on 03/30/2020.    # HTN  - Continue lisinopril and lasix    # Hypercholesterolemia  - Continue lipitor    # CKD stage III, Proteinuria  - Pt following with nephrologist    # Neuropathy  - Continue gabapentin    # GERD, Intermittent nausea  - Give PPI  - Pt was referred to surgery to evaluate for EGD on 11/18/2019.  # Gallstones  - Pt referred to general surgery on 11/18/2019.    # Colon cancer screening  - Pt referred for colonoscopy on 11/17/2020.          - Addendum done 04/01/2020 at 2;17 PM  - Pt seen by Cardiology on 03/20/2020 for cardiac clearance to have surgery. Pt with intermediate cardiovascular risk. No further testing. Pt could proceed with surgery if the above cardiovascular risk is acceptable.  - Spoke with pt and he is aware. Pt will schedule appt with specialist to schedule surgery.                                Tanja Port, MD

## 2020-03-06 ENCOUNTER — Ambulatory Visit
Admission: RE | Admit: 2020-03-06 | Discharge: 2020-03-06 | Disposition: A | Discharge status: Home / Self Care | Payer: BC Managed Care – PPO | Source: Ambulatory Visit | Attending: Internal Medicine | Admitting: Internal Medicine

## 2020-03-06 DIAGNOSIS — Z794 Long term (current) use of insulin: Secondary | ICD-10-CM | POA: Insufficient documentation

## 2020-03-06 DIAGNOSIS — E1165 Type 2 diabetes mellitus with hyperglycemia: Secondary | ICD-10-CM | POA: Insufficient documentation

## 2020-03-06 DIAGNOSIS — Z125 Encounter for screening for malignant neoplasm of prostate: Secondary | ICD-10-CM | POA: Insufficient documentation

## 2020-03-06 DIAGNOSIS — I10 Essential (primary) hypertension: Secondary | ICD-10-CM | POA: Insufficient documentation

## 2020-03-06 DIAGNOSIS — Z01818 Encounter for other preprocedural examination: Secondary | ICD-10-CM | POA: Insufficient documentation

## 2020-03-06 LAB — CBC AND DIFFERENTIAL
Basophils %: 0.7 % (ref 0.0–3.0)
Basophils Absolute: 0.1 10*3/uL (ref 0.0–0.3)
Eosinophils %: 3.5 % (ref 0.0–7.0)
Eosinophils Absolute: 0.4 10*3/uL (ref 0.0–0.8)
Hematocrit: 40.1 % (ref 39.0–52.5)
Hemoglobin: 12.7 gm/dL — ABNORMAL LOW (ref 13.0–17.5)
Lymphocytes Absolute: 1.9 10*3/uL (ref 0.6–5.1)
Lymphocytes: 16.5 % (ref 15.0–46.0)
MCH: 29 pg (ref 28–35)
MCHC: 32 gm/dL (ref 31–36)
MCV: 93 fL (ref 80–100)
MPV: 9.7 fL (ref 6.0–10.0)
Monocytes Absolute: 0.7 10*3/uL (ref 0.1–1.7)
Monocytes: 5.9 % (ref 3.0–15.0)
Neutrophils %: 73.4 % (ref 42.0–78.0)
Neutrophils Absolute: 8.4 10*3/uL (ref 1.7–8.6)
PLT CT: 225 10*3/uL (ref 130–440)
RBC: 4.31 10*6/uL (ref 4.00–5.70)
RDW: 14.1 % (ref 10.5–14.5)
WBC: 11.4 10*3/uL — ABNORMAL HIGH (ref 4.0–11.0)

## 2020-03-06 LAB — PT AND APTT
PT INR: 0.9 (ref 0.9–1.2)
PT: 10.2 s (ref 9.3–11.3)
aPTT: 27.3 s (ref 21.0–32.0)

## 2020-03-06 LAB — COMPREHENSIVE METABOLIC PANEL
ALT: 35 U/L (ref 0–55)
AST (SGOT): 24 U/L (ref 10–42)
Albumin/Globulin Ratio: 0.69 Ratio — ABNORMAL LOW (ref 0.80–2.00)
Albumin: 2.5 gm/dL — ABNORMAL LOW (ref 3.5–5.0)
Alkaline Phosphatase: 128 U/L (ref 40–145)
Anion Gap: 14.4 mMol/L (ref 7.0–18.0)
BUN / Creatinine Ratio: 15.7 Ratio (ref 10.0–30.0)
BUN: 34 mg/dL — ABNORMAL HIGH (ref 7–22)
Bilirubin, Total: 0.2 mg/dL (ref 0.1–1.2)
CO2: 22.7 mMol/L (ref 20.0–30.0)
Calcium: 8.5 mg/dL (ref 8.5–10.5)
Chloride: 110 mMol/L (ref 98–110)
Creatinine: 2.17 mg/dL — ABNORMAL HIGH (ref 0.80–1.30)
EGFR: 33 mL/min/{1.73_m2} — ABNORMAL LOW (ref 60–150)
Globulin: 3.6 gm/dL (ref 2.0–4.0)
Glucose: 167 mg/dL — ABNORMAL HIGH (ref 71–99)
Osmolality Calculated: 295 mOsm/kg (ref 275–300)
Potassium: 5.1 mMol/L (ref 3.5–5.3)
Protein, Total: 6.1 gm/dL (ref 6.0–8.3)
Sodium: 142 mMol/L (ref 136–147)

## 2020-03-06 LAB — PROSTATE SPECIFIC ANTIGEN SCREEN: PSA: 0.5 ng/mL (ref 0.000–4.000)

## 2020-03-11 ENCOUNTER — Other Ambulatory Visit (RURAL_HEALTH_CENTER): Payer: Self-pay | Admitting: Internal Medicine

## 2020-03-11 DIAGNOSIS — I10 Essential (primary) hypertension: Secondary | ICD-10-CM

## 2020-03-16 ENCOUNTER — Telehealth: Payer: Self-pay

## 2020-03-16 NOTE — Telephone Encounter (Signed)
PCP participates in Epic and updates were requested in care everywhere, upcoming appointment on 02.04.22

## 2020-03-17 ENCOUNTER — Ambulatory Visit
Admission: RE | Admit: 2020-03-17 | Discharge: 2020-03-17 | Disposition: A | Discharge status: Home / Self Care | Payer: BC Managed Care – PPO | Source: Ambulatory Visit | Attending: Internal Medicine | Admitting: Internal Medicine

## 2020-03-17 DIAGNOSIS — I10 Essential (primary) hypertension: Secondary | ICD-10-CM | POA: Insufficient documentation

## 2020-03-17 LAB — BASIC METABOLIC PANEL
Anion Gap: 15 mMol/L (ref 7.0–18.0)
BUN / Creatinine Ratio: 19.1 Ratio (ref 10.0–30.0)
BUN: 43 mg/dL — ABNORMAL HIGH (ref 7–22)
CO2: 22.2 mMol/L (ref 20.0–30.0)
Calcium: 8.4 mg/dL — ABNORMAL LOW (ref 8.5–10.5)
Chloride: 108 mMol/L (ref 98–110)
Creatinine: 2.25 mg/dL — ABNORMAL HIGH (ref 0.80–1.30)
EGFR: 31 mL/min/{1.73_m2} — ABNORMAL LOW (ref 60–150)
Glucose: 163 mg/dL — ABNORMAL HIGH (ref 71–99)
Osmolality Calculated: 294 mOsm/kg (ref 275–300)
Potassium: 5.2 mMol/L (ref 3.5–5.3)
Sodium: 140 mMol/L (ref 136–147)

## 2020-03-19 ENCOUNTER — Other Ambulatory Visit (RURAL_HEALTH_CENTER): Payer: Self-pay | Admitting: Internal Medicine

## 2020-03-19 ENCOUNTER — Encounter (RURAL_HEALTH_CENTER): Payer: Self-pay

## 2020-03-19 DIAGNOSIS — N289 Disorder of kidney and ureter, unspecified: Secondary | ICD-10-CM

## 2020-03-19 DIAGNOSIS — D72829 Elevated white blood cell count, unspecified: Secondary | ICD-10-CM

## 2020-03-19 DIAGNOSIS — Z01810 Encounter for preprocedural cardiovascular examination: Secondary | ICD-10-CM | POA: Insufficient documentation

## 2020-03-19 MED ORDER — AMLODIPINE BESYLATE 5 MG PO TABS
5.0000 mg | ORAL_TABLET | Freq: Every day | ORAL | 11 refills | Status: DC
Start: 2020-03-19 — End: 2020-05-28

## 2020-03-19 NOTE — Progress Notes (Signed)
Cardiology Preoperative Risk Evaluation    Patient Name: [name]   Date of Birth: 1965/04/18    Provider: Margarite Gouge, PA     Patient Care Team:  Tanja Port, MD as PCP - General (Internal Medicine)  Briscoe Burns, MD as Consulting Physician (Endocrinology, Diabetes and Metabolism)    Chief Complaint: Pre-operative cardiovascular risk assessment     Impression and Recommendations:     Preoperative Cardiovascular Risk Assessment:    Primary Cardiologist: Burnett Kanaris, PA-C   Procedure: Right Foot Charcot Arthropathy Surgery  Date of Procedure: TBD  Surgeon: TBD    Cardiac Risk Assessment:  Emergency Surgery No   Active Cardiac Conditions No  Functional Capacity using the Duke Activity Status Index: Moderate/Good, >4-10 METs.  Cardiac Risk Factors: smoker, Diabetes Mellitus, hypertension, hypercholesterolemia/hyperlipidemia, renal disease   Revised Cardiac Risk Index: Intermediate Risk: 6.6% Estimated Rate of MI, Pulmonary Edema, Ventricular Fibrillation, Cardiac Risk, or Complete Heart Block    Recommendations:   Anti-Platelet/Coagulation Holding: Aspirin : Can be stopped 5-7 days prior to surgery/procedure   Cardiac Antibiotic Prophylaxis: No   Implanted cardiac device: No   The patient should continue statin therapy.      1. Hypertension: Sub-optimal control. On Amlodipine.   2. Hyperlipidemia: 05/2019- Total 214, HDL 40, LDL 96, Trig 391. On Lipitor.   3. Type 2 Diabetes Mellitus: 10/2019- A1c 7.3%. On long term insulin therapy.   4. Chronic Kidney Disease: Stage 3B. 03/17/2020- Cr 2.25, GFR 31.   5. Charcot Foot Arthropathy: RT. Limiting physical activity.   6. Neuropathy: On Gabapentin.   7. GERD: On medical therapy PRN.   8. Morbid Obesity: Body mass index is 40.06 kg/m.     PLAN:  1. Patient's cardiovascular risk classification for the proposed procedure is Intermediate. Patient is not having cardiac symptoms and has mildly impaired functional capacity. No further testing is  likely to change this risk. The patient may proceed with surgery if the above cardiovascular risk is deemed acceptable.     Problem List     Patient Active Problem List   Diagnosis    Type 2 diabetes mellitus with hyperglycemia, with long-term current use of insulin    Carpal tunnel syndrome of right wrist    Essential hypertension    History of kidney injury    Mixed hyperlipidemia    Diabetic foot ulcer    Diabetic nephropathy    Diabetic polyneuropathy associated with type 2 diabetes mellitus    Osteomyelitis of left foot, unspecified chronicity    Osteomyelitis    Chronic left shoulder pain    Pain in lower jaw    Dental abscess    Muscle cramps    Acute non-recurrent maxillary sinusitis    CKD (chronic kidney disease)    Preoperative cardiovascular examination    Morbid obesity       History of Present Illness   [name] is a 55 y.o. male here for preoperative cardiovascular risk examination. The patient is in the process of getting scheduled for surgery of his RT Charcot Foot Arthropathy.  He has a past medical history significant for hypertension, hyperlipidemia, type 2 diabetes mellitus, chronic kidney disease, Charcot foot arthropathy, neuropathy, GERD, and morbid obesity.  His most recent cardiac work-up was on March 16, 2015 and involved an echocardiogram.  This demonstrated a LVEF of 123456, grade 1 diastolic dysfunction, and no significant valvular abnormalities.    From a symptomatic standpoint, the patient has been doing well.  He specifically denies chest pain, shortness of breath, dizziness or syncope. He does endorse trouble breathing when he lies down at night-however this has been ongoing for years and the patient believes that he has obstructive sleep apnea.  He does note mild right lower extremity edema that has been present since his diagnosis of Charcot arthropathy.  Patient also notes rare palpitations that occur once every few months.  This concern only last for a  few seconds and he denies any associated symptoms.    His blood pressure is elevated today at 160/100 mmHg.  The patient notes that his family doctor recently discontinued his lisinopril 40 mg daily due to his kidney disease and started him on amlodipine 5 mg daily.  He has a follow-up with his PCP within the next few weeks for reevaluation.  Patient notes that he has had 4 surgeries on his left foot for osteomyelitis and complications related to this diagnosis.  His most recent was March of last year without perioperative complications.    He continues to smoke approximately 3 packs a week and has been smoking since he was 55 years old.  He denies alcohol or drug use.  His cardiovascular exercise is limited secondary to his foot pain.  However, according to the Duke activity status index, the patient is able to accomplish 5.69 METS.  He denies exertional complaints.  Mother suffered from type 2 diabetes mellitus.  Patient denies family history of premature coronary artery disease, cardiomyopathy, congestive heart failure, arrhythmia, or sudden cardiac death.    Review of Systems     Comprehensive review of systems performed. Other than what is noted in the HPI, all systems are negative.       Past Medical History   PMH- 03/20/2020 CB  EKG- 03/05/2020  Labs- 03/06/2020 In Epic    ED Visit 12/06/2018- Pain due to caries  ED Visit 01/13/2019- Dental Abscess   ED Visit 05/12/2019- Abdominal Pain    1. HTN  A. Echo 03/16/2015- EF 60%. There is grade I diastolic dysfunction of the left ventricle.     2. HLD  A. Lipids 03/05/2019- CHOL 158, TRIG 331, HDL 32, LDL 60  B. Lipids 06/06/2019- CHOL 214, TRIG 391, HDL 40, LDL 96    3. DM  4. CKD  5. Morbid Obesity  Past Surgical History     Past Surgical History:   Procedure Laterality Date    ARTHRODESIS, TOE Left 05/04/2015    Procedure: ARTHRODESIS, TOE;  Surgeon: Janora Norlander, DPM;  Location: [facility];  Service: Podiatry;  Laterality: Left;  1st MPJ FUSION     DEBRIDEMENT & IRRIGATION, LOWER EXTREMITY Left 03/12/2015    Procedure: [place], LOWER EXTREMITY;  Surgeon: Janora Norlander, DPM;  Location[facility];  Service: Podiatry;  Laterality: Left;  I&D LEFT FOOT FOR BONE INFECTION    DEBRIDEMENT & IRRIGATION, LOWER EXTREMITY Left 03/07/2016    Procedure: DEBRIDEMENT & IRRIGATION, LEFT FOOT 2ND TOE, EXCISION OF 2ND METATARSEL;  Surgeon: Janora Norlander, DPM;  Location: [facility];  Service: Podiatry;  Laterality: Left;  I&D Left Foot    HAND SURGERY      SESAMOIDECTOMY Left 05/24/2019    Procedure: SESAMOIDECTOMY;  Surgeon: [name], DPM;  Location: [place];  Service: Podiatry;  Laterality: Left;     Family History     Family History   Problem Relation Age of Onset    Diabetes Mother  Lung cancer Mother     Cancer Mother         lung    Depression Sister     COPD Sister     Cancer Sister         breast    Hypertension Brother      Social History     Social History     Tobacco Use    Smoking status: Current Some Day Smoker     Packs/day: 0.25     Types: Cigarettes     Last attempt to quit: 09/06/2017     Years since quitting: 2.5    Smokeless tobacco: Never Used    Tobacco comment: 3 packs a week   Vaping Use    Vaping Use: Never used   Substance Use Topics    Alcohol use: No     Alcohol/week: 0.0 standard drinks     Comment: in past    Drug use: No     Comment: in past     Allergies     Allergies   Allergen Reactions    Influenza Virus Vaccine Anaphylaxis     Medications     Current Outpatient Medications   Medication Sig    amLODIPine (NORVASC) 5 MG tablet Take 1 tablet (5 mg total) by mouth daily    amlodipine-atorvastatin (CADUET) 2.5-10 MG per tablet Take 1 tablet by mouth daily '5mg'$  qd    aspirin EC 81 MG EC tablet Take 1 tablet (81 mg total) by mouth every evening.    atorvastatin (LIPITOR) 40 MG tablet TAKE 1 TABLET BY MOUTH ONCE DAILY    Blood Glucose Monitoring Suppl (RELION  PRIME MONITOR) Device by Does not apply route USE TO TEST BLOOD GLUCOSE FOUR TIMES DAILY    Continuous Blood Gluc Receiver (Dexcom G6 Receiver) Device 1 Device by Does not apply route daily    Continuous Blood Gluc Sensor (Dexcom G6 Sensor) Misc 1 each by Does not apply route every 10 (ten) days    Continuous Blood Gluc Transmit (Dexcom G6 Transmitter) Misc Inject 1 Device into the skin daily    furosemide (LASIX) 20 MG tablet Take 20 mg by mouth daily       gabapentin (NEURONTIN) 300 MG capsule Take 1 capsule (300 mg total) by mouth 3 (three) times daily    Glucagon (Baqsimi Two Pack) 3 MG/DOSE Powder 1 each by Nasal route as needed (severe HYPOglycemia)    glucose blood (RELION PRIME TEST) test strip 1 each by Other route 4 (four) times daily Use as instructed    insulin glargine (BASAGLAR KWIKPEN) 100 UNIT/ML injection pen Inject 62 Units into the skin nightly    Insulin Pen Needle 31G X 6 MM Misc by Does not apply route.Use up to 7 times a day as directed    liraglutide (Victoza) 18 MG/3ML injection INJECT 1.'8MG'$  SUBCUTANEOUSLY ONCE DAILY    Multiple Vitamin (multivitamin) capsule Take 1 capsule by mouth daily    NovoLOG FlexPen 100 UNIT/ML injection pen INJECT SUBCUTANEOUSLY PRIOR TO MEALS PER CORRECTION SCALE UP TO 10 UNITS DAILY    ondansetron (ZOFRAN) 8 MG tablet Take 1 tablet (8 mg total) by mouth every 6 (six) hours as needed for Nausea    Vitamin D3 (CHOLECALCIFEROL) 50 MCG (2000 UT) tablet Take 1 tablet (50 mcg total) by mouth daily     Physical Exam     Visit Vitals  BP (!) 160/100   Pulse 92   Wt 112.6 kg (248 lb  3.2 oz)   BMI 40.06 kg/m     Wt Readings from Last 3 Encounters:   03/20/20 112.6 kg (248 lb 3.2 oz)   03/05/20 114.8 kg (253 lb)   11/18/19 114.9 kg (253 lb 6.4 oz)     Physical Exam  Constitutional:       Appearance: Normal appearance. He is well-developed. He is morbidly obese.   HENT:      Head: Normocephalic and atraumatic.      Right Ear: Hearing normal.      Left Ear:  Hearing normal.   Eyes:      General: No scleral icterus.        Right eye: No discharge.         Left eye: No discharge.      Conjunctiva/sclera: Conjunctivae normal.   Neck:      Vascular: No carotid bruit, hepatojugular reflux or JVD.   Cardiovascular:      Rate and Rhythm: Normal rate and regular rhythm.  No extrasystoles are present.     Heart sounds: Normal heart sounds. No murmur heard.  No friction rub. No gallop.    Pulmonary:      Effort: Pulmonary effort is normal.      Breath sounds: Normal breath sounds. No wheezing, rhonchi or rales.   Abdominal:      Palpations: Abdomen is soft.      Tenderness: There is no abdominal tenderness.   Musculoskeletal:      Right lower leg: Edema (Non-pitting) present.   Skin:     General: Skin is warm and dry.      Nails: There is no clubbing.   Neurological:      Mental Status: He is alert and oriented to person, place, and time.      Gait: Gait normal.   Psychiatric:         Thought Content: Thought content normal.       Labs     Lab Results   Component Value Date/Time    WBC 11.4 (H) 03/06/2020 09:23 AM    RBC 4.31 03/06/2020 09:23 AM    HGB 12.7 (L) 03/06/2020 09:23 AM    HCT 40.1 03/06/2020 09:23 AM    PLT 225 03/06/2020 09:23 AM    TSH 1.33 10/19/2019 11:39 AM    TSH 1.59 09/13/2017 04:28 PM     Lab Results   Component Value Date/Time    NA 140 03/17/2020 12:49 PM    K 5.2 03/17/2020 12:49 PM    CL 108 03/17/2020 12:49 PM    CO2 22.2 03/17/2020 12:49 PM    GLU 163 (H) 03/17/2020 12:49 PM    BUN 43 (H) 03/17/2020 12:49 PM    CREAT 2.25 (H) 03/17/2020 12:49 PM    PROT 6.1 03/06/2020 09:23 AM    ALKPHOS 128 03/06/2020 09:23 AM    AST 24 03/06/2020 09:23 AM    ALT 35 03/06/2020 09:23 AM     Lab Results   Component Value Date/Time    CHOL 214 (H) 06/06/2019 08:03 AM    TRIG 391 (H) 06/06/2019 08:03 AM    HDL 40 06/06/2019 08:03 AM    LDL 96 06/06/2019 08:03 AM     EKG:     EKG: sinus rhythm, rate 92 bpm.          Electronically signed by:     Margarite Gouge, PA    03/20/2020  [facility]  [address],  [place], [place] [address]  [phone]      Note: This chart was generated by the [facility] EMR system/speech recognition and may contain inherit omission or errors not intended by the user.  Grammatical errors, random word insertions, deletions, pronoun errors and incomplete sentences are occasionally consequences of this technology due to software limitations.  Not all errors are caught or corrected.  If there are questions or concerns about the content of this note or information contained in the body of this dictation they should be addressed directly with the author for clarification.

## 2020-03-20 ENCOUNTER — Ambulatory Visit: Payer: BC Managed Care – PPO | Admitting: Physician Assistant

## 2020-03-20 ENCOUNTER — Encounter: Payer: Self-pay | Admitting: Physician Assistant

## 2020-03-20 VITALS — BP 160/100 | HR 92 | Wt 248.2 lb

## 2020-03-20 DIAGNOSIS — E1142 Type 2 diabetes mellitus with diabetic polyneuropathy: Secondary | ICD-10-CM

## 2020-03-20 DIAGNOSIS — I10 Essential (primary) hypertension: Secondary | ICD-10-CM

## 2020-03-20 DIAGNOSIS — Z01810 Encounter for preprocedural cardiovascular examination: Secondary | ICD-10-CM

## 2020-03-20 DIAGNOSIS — N1832 Chronic kidney disease, stage 3b: Secondary | ICD-10-CM

## 2020-03-20 DIAGNOSIS — E1165 Type 2 diabetes mellitus with hyperglycemia: Secondary | ICD-10-CM

## 2020-03-20 DIAGNOSIS — E782 Mixed hyperlipidemia: Secondary | ICD-10-CM

## 2020-03-20 DIAGNOSIS — Z01818 Encounter for other preprocedural examination: Secondary | ICD-10-CM

## 2020-03-26 ENCOUNTER — Ambulatory Visit
Admission: RE | Admit: 2020-03-26 | Discharge: 2020-03-26 | Disposition: A | Discharge status: Home / Self Care | Payer: BC Managed Care – PPO | Source: Ambulatory Visit | Attending: Internal Medicine | Admitting: Internal Medicine

## 2020-03-26 ENCOUNTER — Telehealth (RURAL_HEALTH_CENTER): Payer: Self-pay | Admitting: Internal Medicine

## 2020-03-26 ENCOUNTER — Telehealth (RURAL_HEALTH_CENTER): Payer: Self-pay

## 2020-03-26 DIAGNOSIS — E875 Hyperkalemia: Secondary | ICD-10-CM

## 2020-03-26 DIAGNOSIS — D72829 Elevated white blood cell count, unspecified: Secondary | ICD-10-CM | POA: Insufficient documentation

## 2020-03-26 DIAGNOSIS — N289 Disorder of kidney and ureter, unspecified: Secondary | ICD-10-CM

## 2020-03-26 LAB — BASIC METABOLIC PANEL
Anion Gap: 15.6 mMol/L (ref 7.0–18.0)
BUN / Creatinine Ratio: 15.9 Ratio (ref 10.0–30.0)
BUN: 36 mg/dL — ABNORMAL HIGH (ref 7–22)
CO2: 21.9 mMol/L (ref 20.0–30.0)
Calcium: 8.6 mg/dL (ref 8.5–10.5)
Chloride: 108 mMol/L (ref 98–110)
Creatinine: 2.26 mg/dL — ABNORMAL HIGH (ref 0.80–1.30)
EGFR: 31 mL/min/{1.73_m2} — ABNORMAL LOW (ref 60–150)
Glucose: 153 mg/dL — ABNORMAL HIGH (ref 71–99)
Osmolality Calculated: 291 mOsm/kg (ref 275–300)
Potassium: 5.5 mMol/L — ABNORMAL HIGH (ref 3.5–5.3)
Sodium: 140 mMol/L (ref 136–147)

## 2020-03-26 LAB — CBC AND DIFFERENTIAL
Basophils %: 0.9 % (ref 0.0–3.0)
Basophils Absolute: 0.1 10*3/uL (ref 0.0–0.3)
Eosinophils %: 3.5 % (ref 0.0–7.0)
Eosinophils Absolute: 0.5 10*3/uL (ref 0.0–0.8)
Hematocrit: 40.2 % (ref 39.0–52.5)
Hemoglobin: 12.9 gm/dL — ABNORMAL LOW (ref 13.0–17.5)
Lymphocytes Absolute: 2.5 10*3/uL (ref 0.6–5.1)
Lymphocytes: 19.2 % (ref 15.0–46.0)
MCH: 29 pg (ref 28–35)
MCHC: 32 gm/dL (ref 31–36)
MCV: 91 fL (ref 80–100)
MPV: 8.4 fL (ref 6.0–10.0)
Monocytes Absolute: 0.4 10*3/uL (ref 0.1–1.7)
Monocytes: 3.4 % (ref 3.0–15.0)
Neutrophils %: 73 % (ref 42.0–78.0)
Neutrophils Absolute: 9.6 10*3/uL — ABNORMAL HIGH (ref 1.7–8.6)
PLT CT: 263 10*3/uL (ref 130–440)
RBC: 4.41 10*6/uL (ref 4.00–5.70)
RDW: 14 % (ref 10.5–14.5)
WBC: 13.1 10*3/uL — ABNORMAL HIGH (ref 4.0–11.0)

## 2020-03-26 MED ORDER — SODIUM POLYSTYRENE SULFONATE PO POWD
15.0000 g | Freq: Once | ORAL | 0 refills | Status: AC
Start: 2020-03-26 — End: 2020-03-26

## 2020-03-26 NOTE — Telephone Encounter (Signed)
Repeat BMP with potassium level of 5.5 H.  Results d/w pt.  Pt will avoid food with high potassium.  Prescription for kayexalate 15 g x 1 dose sent to pharmacy.  Pt no longer taking lisinopril and lasix. Continue amlodipine instead.  Check BMP on 03/30/2020.  Renal sonogram scheduled for 04/07/2020.  Ptto schedule appt to his nephrologist.

## 2020-03-26 NOTE — Telephone Encounter (Signed)
Patient aware that labs are due before upcoming appt on 03/30/2020.

## 2020-03-26 NOTE — Progress Notes (Signed)
Endocrine Provider Progress Note      Patient Name:  [name] [id number] DOB: Sep 14, 1965  Date: 03/30/2020    Subjective:          [name] is a 55 y.o. male who presents for follow up of type 2 diabetes mellitus.   Since the last visit, the patient has had problems with swelling and pain in right foot; went to his Podiatrist, Dr. Nada Boozer and diagnosed with Charcot foot; referred to [facility] for surgery.   Reports he met with Dr. Rico Junker at [facility], and is completing pre-op testing before his surgery will be scheduled at [facility].  Since he stopped working last month, and is less active, he has noticed his BG readings are trending up. He has not adjusted insulin doses.   Is following with Nephrology in [place], [place].    His PCP did recent lab work, and potassium level was elevated, and he was instructed to stop Lisinopril, and avoid potassium rich foods, and start Amlodipine.  He denies chest pain, palpitations, muscle cramping, abdominal pain, nausea, vomiting or diarrhea.        Monitoring, Compliance and Complications:  Diagnosis Date: ~2000-2001  Prior Diabetic Treatment: Wilder Glade (discontinued due to renal function) -patient reports today Wilder Glade was stopped during hospitalization for osteomyelitis.   Hgb A1c at time of referral: 11.1% (done 12/20/16).  HGB A1C goal based on age and comorbidities is 7%.  The patient has the following diabetic complications:  retinopathy, nephropathy and neuropathy, history of foot ulcer.  He has had multiple surgeries for osteomyelitis on his left foot in 2017.     Glucose Meter: Reli-On, not sure which model  Glucose Meter Validation Date: never  The patient did not bring the glucose meter or glucose readings today.  Compliance with blood glucose monitoring: fair   The patient is not currently testing home blood glucose.   Glucose readings are as follows:  -    CGM Type:  Dexcom G6  CGM use fequency:  all of the time   Calibration frequency: This CGM does not require  callibration  Percent of time in target range: 60  For additional CGM data, see scanned CGM download attached to this visit note.   Average sensor glucose: 172  Percent of time above target range: 38% high, 2% very high  Percent of time below target range: 0   Tracings from March 17, 2020 through March 30, 2020 were reviewed today.  Higher BG trends 6pm to 9pm; correlates with supper intake, which is his largest meal.   Readings from 6am - 8am range 150 - 200.      Injections:   Injections are given by patient.   Injections sites include: abdominal wall  Injection compliance: The patient never misses injections.  The patient reports some bruising at injection sites. with basaglar   Will take Novolog before meals for glucose >120; currently taking 4 units before breakfast; 6 units before lunch and 10 units before supper.      Diet, Exercise, and Weight:     The patient tries to watch portions, starches and sweets.  Compliance with diet has been variable.   Exercise: The patient is unable to exercise due to diagnosis of Charcot foot on right. .   Weight: Weight has been stable since last visit.    Hypoglycemia:   The patient has experienced mild occasional hypoglycemia since the last visit. Symptoms of hypoglycemia include: hunger and shaking, blurred vision, nausea. . The  patient treats hypoglycemia with carb snack, glucose tablets    The patient has Nasal Glucagon (Baqsimi). This is up-to-date and family members have been instructed in its use.   The patient does not have a medical alert bracelet or necklace.    Diabetic Education: The patient has never received diabetes education.         Diabetic Complication Screening/Treatment  Eye exam: Dr. Deirdre Pippins. Last visit was 03/17/2020.  No evidence of diabetic retinopathy on last eye exam.  Urine microalbumin/creatinine ratio: N/A. The patient has CKD followed by nephrologist.    Foot Care: The patient reports continued problems with  neuropathic pain  Most  recent appointment with Dr. Nada Boozer 01/21/2020  Monofilament: 03/30/2020, not intact   Aspirin Therapy: The patient is currently taking aspirin at dose indicated in the medication list.   Cardiovascular risk factors: diabetes mellitus, dyslipidemia, hypertension, male gender, obesity (BMI >= 30 kg/m2), sedentary lifestyle and smoking/ tobacco exposure  Ace Inhibitor/ARB: The patient discontinued ACE-I/ARB due to elevated potassium, and PCP instructed to stop ACE-I .  Dental care: Patient has received regular dental care, most recently Feb-March 2021 Had 4 teeth extracted    Current Outpatient Medications   Medication Sig Dispense Refill    amLODIPine (NORVASC) 5 MG tablet Take 1 tablet (5 mg total) by mouth daily 30 tablet 11    aspirin EC 81 MG EC tablet Take 1 tablet (81 mg total) by mouth every evening. 90 tablet 3    atorvastatin (LIPITOR) 40 MG tablet TAKE 1 TABLET BY MOUTH ONCE DAILY 90 tablet 2    Blood Glucose Monitoring Suppl (RELION PRIME MONITOR) Device by Does not apply route USE TO TEST BLOOD GLUCOSE FOUR TIMES DAILY      Continuous Blood Gluc Receiver (Dexcom G6 Receiver) Device 1 Device by Does not apply route daily 1 Device 0    Continuous Blood Gluc Sensor (Dexcom G6 Sensor) Misc 1 each by Does not apply route every 10 (ten) days ICD 10 code:  E11.65 9 each 3    Continuous Blood Gluc Transmit (Dexcom G6 Transmitter) Misc Inject 1 Device into the skin daily ICD 10 code:  E11.65 1 each 0    gabapentin (NEURONTIN) 300 MG capsule Take 1 capsule (300 mg total) by mouth 3 (three) times daily 90 capsule 5    Glucagon (Baqsimi Two Pack) 3 MG/DOSE Powder 1 each by Nasal route as needed (severe HYPOglycemia) 1 each 1    glucose blood (RELION PRIME TEST) test strip 1 each by Other route 4 (four) times daily Use as instructed      insulin glargine (BASAGLAR KWIKPEN) 100 UNIT/ML injection pen Inject 64 Units into the skin nightly 60 mL 1    Insulin Pen Needle 31G X 6 MM Misc by Does not apply route.Use up  to 7 times a day as directed      liraglutide (Victoza) 18 MG/3ML injection INJECT 1.'8MG'$  SUBCUTANEOUSLY ONCE DAILY 9 mL 5    Multiple Vitamin (multivitamin) capsule Take 1 capsule by mouth daily      NovoLOG FlexPen 100 UNIT/ML injection pen INJECT SUBCUTANEOUSLY three times a day PRIOR TO MEALS PER CORRECTION SCALE UP TO 40 UNITS DAILY 15 mL 3    ondansetron (ZOFRAN) 8 MG tablet Take 1 tablet (8 mg total) by mouth every 6 (six) hours as needed for Nausea 10 tablet 0    Vitamin D3 (CHOLECALCIFEROL) 50 MCG (2000 UT) tablet Take 1 tablet (50 mcg total) by mouth daily  No current facility-administered medications for this visit.      Medication review with [name] suggested compliance all of the time.       Immunization History   Administered Date(s) Administered    Td 06/14/2009    Td,preservative free 06/14/2009    Tdap 07/06/2011     The following portions of the patient's history were reviewed and updated as appropriate: current medications, past medical history and problem list.  The following information was also reviewed at today's visit: lab data and continuous glucose sensor download data    Review of Systems  As above.      Objective:      Vital Signs: BP 140/88    Pulse 94    Resp 19    Ht 1.676 m ('5\' 6"'$ )    Wt 114.1 kg (251 lb 8 oz)    SpO2 96%    BMI 40.59 kg/m      B/P recheck:  140/82.    PE  Well nourished, well appearing, in no acute distress.  HEENT:  There is a slight stare.  There is no lid lag. There is no periorbital edema. Extraoccular movements are intact. No conjunctival injection.  oral mucosa not examined. Patient wearing mask.  Neck is supple without adenopathy.  The thyroid is normal in size and consistency and no nodules are appreciated. The thyroid is normal on inspection.  Lungs are clear to auscultation.  Cardiac exam reveals a regular rate and rhythm. No murmur or gallop is appreciated.   Exam of the extremities reveals +1 bilateral ankle edema.     Posterior  tibial and doralis pedis pulses are normal.  There is deformity of the right foot, including flat foot.  Callouses are noted heels. Onychomycosis is noted in multiple nails on both feet.  On neurologic exam, the patient is alert and appropriate.  Speech is normal.  There is no tremor of the outstretched hands. Monofilament is intact  great toe right and left but absent  in the metarasal heads bilaterally.   Skin is warm and dry.     Lab Review    Lab Results   Component Value Date    HGBA1CPERCNT 7.1 03/27/2020    HGBA1CPERCNT 7.3 10/19/2019    HGBA1CPERCNT 7.9 06/06/2019    TSH 1.33 10/19/2019    CHOL 186 03/27/2020    HDL 33 (L) 03/27/2020    LDL 80 03/27/2020    TRIG 364 (H) 03/27/2020    ALT 31 03/27/2020    B12 419 09/13/2017    VITD 19.6 (L) 02/14/2019    GLU 169 (H) 03/27/2020    K 5.0 03/27/2020    CA 8.7 03/27/2020    CREAT 2.19 (H) 03/27/2020    CO2 23.1 03/27/2020    EGFR 32 (L) 03/27/2020    NA 140 03/27/2020    ALKPHOS 128 03/06/2020       03/09/19 vitamin D2/D3 total 21 (normal 20-50)    Assessment:      1. Type 2 diabetes mellitus with hyperglycemia, with long-term current use of insulin  liraglutide (Victoza) 18 MG/3ML injection    insulin glargine (BASAGLAR KWIKPEN) 100 UNIT/ML injection pen    Continuous Blood Gluc Sensor (Dexcom G6 Sensor) Misc    Continuous Blood Gluc Transmit (Dexcom G6 Transmitter) Misc    NovoLOG FlexPen 100 UNIT/ML injection pen    Hemoglobin A1C    Lipid panel    ALT    Basic Metabolic Panel  Referral to Diabetic Education   2. CGM Dex Com     3. Essential hypertension     4. Vitamin D deficiency  Vitamin D,25 OH, Total     Diabetes: A1c within goal but diabetes control suboptimal based on variability in glucose readings. Regimen adjusted as outlined below.  Based on CGM data reviewed; Insulin adjustments as instructed below.  Will avoid use of SGLT2i (such as Farxiga) due to history of foot ulcer.  Emphasized importance of glycemic control especially with upcoming surgery.      Lipids: We have reviewed that the triglycerides are above goal. We have discussed that they are likely to improve with decreased intake of simple sugars and improved diabetic control.     Hypertension; B/P above goal today; instructed to monitor home B/P; reviewed goal for B/P; and rationale for checking this at home with recent medication change.     Vitamin D deficiency; on OTC supplement; will recheck level.          Plan:   Below is a summary of information and instructions we reviewed at today's appointment. Please review this information carefully and call if you have any questions regarding it.     Diabetes information/instructions:   Labwork: You are due for lab work  on or after 06/24/2020 .   You should fast for 10 hours prior to having this labwork done.    Self-monitoring of blood glucose: We recommend that you monitor your blood glucose before breakfast, lunch, dinner and bedtime, and as needed for signs and/or symptoms of hypoglycemia. Please fax, email, drop off or MyChartMessage your blood sugar readings for further adjustment of regimen  if at anytime you notice too many high or low blood sugar readings  Sharing your CGM data between visits:  We can make more progress helping you improve blood sugar control if we review your CGM data between visits. Please let us know when you have uploaded your device so we can review your data each time.   To share data from a DexCom CGM: please provide the office with the email account associated with your Dexcom.  We will send you an email invitation to share your data.  Please then log into your Dexcom account to accept the invitation, consent to share data with the clinic, and tap "Yes, Share My Data."   Important general information about using a continuous glucose sensor (CGM): Do not inject insulin within 1-3 inches of the sensor. Remove sensor if you are having MRI, CT, X-ray. Rotate sensor sites. If you stop wearing your CGM for a period of time,  please resume fingerstick blood sugar checks at least 4 times a day.   Please contact Dexcom for help with troubleshooting.     Please scan your continuous glucose monitor sensor before meals, and one hour after meals.  The goal for your blood glucose before meals is 90 - 130.  The goal for your blood glucose one hour after meals is 100 - 180.     Diabetic Medication/Insulin Instructions:  Increase your Basaglar insulin dose to 64 units once a day     For your Novolog, continue 4 units before breakfast, and 6 units before lunch and 10 units before supper, and add correction scale:    If your before meal blood glucose is:  150 - 200, add 2 units Novolog  201- 250, add 4 units Novolog  251-300,  add 6 units Novolog  301 - 350, add 8 units Novolog  351 - 400, add 10 units Novolog and notify the office  .    Low Blood Sugar Prevention and Treatment:   Please carry fast-acting carbohydrate source (preferably glucose tablets) with you at all times. Please treat low blood sugar with 15 grams carbohydrate (4 glucose tablets), wait 15 minutes, and recheck sugar. Re-treat if necessary.   Please notify office for any episode of severe hypoglycemia, such as those requiring assistance from someone else.     Aspirin: Please continue taking low-dose aspirin.    Diet:   Please decrease your intake of simple sugars and starches. Please watch your portions.  Please try to avoid missing meals.    Activity:   Follow your podiatrist and surgeon's instructions.  you can do arm exercises for 10 minutes a day.     Foot Care:   Please examine feet daily and appropriately treat callouses or lesions. Please avoid being barefoot or wearing of ill fitting shoes. Please use lotion as needed to treat dry skin on feet.  Please follow up with your podiatrist regarding recent foot surgery, routine foot care    Diabetic Education:   We did not discuss diabetic education today. If you would like to follow up with a diabetic educator, please call the  office and we will arrange for this    Screening for Health Complications of Diabetes:   Diabetic screening is up-to-date except for a dental exam. Please schedule an appointment with your dentist.  Free dental cleanings are available through the [facility] hygeine program. [phone] You may wish to consider sedation dentistry. One provider is Dr. Burnadette Peter at [facility], [phone].    The following additional instructions relate to health conditions other than your diabetes:   Cholesterol/lipids: Your cholesterol is in good range but your triclyerides are elevated and your HDL ("good" cholesterol") is low. We have reviewed that decreased intake of "simple" sugars (including fruit, fruit juice, other sweet drinks and sugary foods) can help to improve your triglycerides. Increased exercise can help raise your HDL.     Blood Pressure: Please monitor your blood pressure regularly. Your blood pressure goal is 130/80.  Please contact your PCP if your blood pressure remains above goal.   Please follow up with your primary care provider regarding planning surgery on your right foot, and hospital discharge follow up   Continue to follow with your kidney doctor, your podiatrist and your surgeon at [facility].       Return in about 4 weeks (around 04/27/2020).  At [facility] office.     Maricela Bo, NP       From 3:47pm to 4:45pm,  57 minutes was the total time spent on behalf of this patient on this date, including time spent preparing to see the patient, obtaining and/or reviewing separately obtained history, performing a medically appropriate physical examination or evaluation, counseling and educating patient/family/caregiver, ordering medications, tests or procedures, referring and communicating with other health care professionals, documenting clinical information in the electronic medical record and Independently interpreting results and communicating results to patient/family/caregiver.

## 2020-03-27 ENCOUNTER — Ambulatory Visit
Admission: RE | Admit: 2020-03-27 | Discharge: 2020-03-27 | Disposition: A | Discharge status: Home / Self Care | Payer: BC Managed Care – PPO | Source: Ambulatory Visit | Attending: Internal Medicine | Admitting: Internal Medicine

## 2020-03-27 DIAGNOSIS — E1165 Type 2 diabetes mellitus with hyperglycemia: Secondary | ICD-10-CM | POA: Insufficient documentation

## 2020-03-27 DIAGNOSIS — E875 Hyperkalemia: Secondary | ICD-10-CM | POA: Insufficient documentation

## 2020-03-27 DIAGNOSIS — Z794 Long term (current) use of insulin: Secondary | ICD-10-CM | POA: Insufficient documentation

## 2020-03-27 LAB — BASIC METABOLIC PANEL
Anion Gap: 12.9 mMol/L (ref 7.0–18.0)
BUN / Creatinine Ratio: 16 Ratio (ref 10.0–30.0)
BUN: 35 mg/dL — ABNORMAL HIGH (ref 7–22)
CO2: 23.1 mMol/L (ref 20.0–30.0)
Calcium: 8.7 mg/dL (ref 8.5–10.5)
Chloride: 109 mMol/L (ref 98–110)
Creatinine: 2.19 mg/dL — ABNORMAL HIGH (ref 0.80–1.30)
EGFR: 32 mL/min/{1.73_m2} — ABNORMAL LOW (ref 60–150)
Glucose: 169 mg/dL — ABNORMAL HIGH (ref 71–99)
Osmolality Calculated: 291 mOsm/kg (ref 275–300)
Potassium: 5 mMol/L (ref 3.5–5.3)
Sodium: 140 mMol/L (ref 136–147)

## 2020-03-27 LAB — LIPID PANEL
Cholesterol: 186 mg/dL (ref 75–199)
Coronary Heart Disease Risk: 5.64
HDL: 33 mg/dL — ABNORMAL LOW (ref 40–55)
LDL Calculated: 80 mg/dL
Triglycerides: 364 mg/dL — ABNORMAL HIGH (ref 10–150)
VLDL: 73 — ABNORMAL HIGH (ref 0–40)

## 2020-03-27 LAB — HEMOGLOBIN A1C: Hgb A1C, %: 7.1 %

## 2020-03-27 LAB — ALT: ALT: 31 U/L (ref 0–55)

## 2020-03-30 ENCOUNTER — Encounter (RURAL_HEALTH_CENTER): Payer: Self-pay | Admitting: Family

## 2020-03-30 ENCOUNTER — Ambulatory Visit: Payer: BC Managed Care – PPO | Attending: Family | Admitting: Family

## 2020-03-30 ENCOUNTER — Telehealth (RURAL_HEALTH_CENTER): Payer: Self-pay

## 2020-03-30 VITALS — BP 140/88 | HR 94 | Resp 19 | Ht 66.0 in | Wt 251.5 lb

## 2020-03-30 DIAGNOSIS — Z794 Long term (current) use of insulin: Secondary | ICD-10-CM

## 2020-03-30 DIAGNOSIS — Z978 Presence of other specified devices: Secondary | ICD-10-CM | POA: Insufficient documentation

## 2020-03-30 DIAGNOSIS — E1165 Type 2 diabetes mellitus with hyperglycemia: Secondary | ICD-10-CM

## 2020-03-30 DIAGNOSIS — I10 Essential (primary) hypertension: Secondary | ICD-10-CM

## 2020-03-30 DIAGNOSIS — E559 Vitamin D deficiency, unspecified: Secondary | ICD-10-CM

## 2020-03-30 MED ORDER — BASAGLAR KWIKPEN 100 UNIT/ML SC SOPN
64.0000 [IU] | PEN_INJECTOR | Freq: Every evening | SUBCUTANEOUS | 1 refills | Status: DC
Start: ? — End: 2020-03-30

## 2020-03-30 MED ORDER — NOVOLOG FLEXPEN 100 UNIT/ML SC SOPN
PEN_INJECTOR | SUBCUTANEOUS | 3 refills | Status: DC
Start: ? — End: 2020-03-30

## 2020-03-30 MED ORDER — VICTOZA 18 MG/3ML SC SOPN
PEN_INJECTOR | SUBCUTANEOUS | 5 refills | Status: DC
Start: ? — End: 2020-03-30

## 2020-03-30 MED ORDER — DEXCOM G6 TRANSMITTER MISC
1.0000 | Freq: Every day | 0 refills | Status: DC
Start: ? — End: 2020-03-30

## 2020-03-30 MED ORDER — DEXCOM G6 SENSOR MISC
1.0000 | 3 refills | Status: DC
Start: ? — End: 2020-03-30

## 2020-03-30 NOTE — Telephone Encounter (Signed)
During patients appointment with Rod Holler, NP (endocrinology) patient had questions in regards to a medication prescribed by PCP (Dr. Mindi Slicker) prescription was for Kayexalate. Patient was unsure how to take this medication since it is a powder and required patient to mix with water. Called the [facility] office and spoke with Abigail Butts, [place] who then transferred me to Dr. Mindi Slicker. Dr. Mindi Slicker reviewed patients labs with me that he had ordered for patient as well as the BMP that was ordered by [facility]. Dr. Mindi Slicker stated since patients potassium has normalized he does not need to take the Kayexalate and asked for our office to inform patient of this. Dr. Mindi Slicker had also requested that our office remind the patient to continue his low potassium diet. Kayexalate has been removed from patients medication list during his visit today 03/30/2020.

## 2020-03-30 NOTE — Patient Instructions (Addendum)
Below is a summary of information and instructions we reviewed at today's appointment. Please review this information carefully and call if you have any questions regarding it.     Diabetes information/instructions:   Labwork: You are due for lab work  on or after 06/24/2020 .   You should fast for 10 hours prior to having this labwork done.    Self-monitoring of blood glucose: We recommend that you monitor your blood glucose before breakfast, lunch, dinner and bedtime, and as needed for signs and/or symptoms of hypoglycemia. Please fax, email, drop off or MyChartMessage your blood sugar readings for further adjustment of regimen  if at anytime you notice too many high or low blood sugar readings  Sharing your CGM data between visits:  We can make more progress helping you improve blood sugar control if we review your CGM data between visits. Please let us know when you have uploaded your device so we can review your data each time.   To share data from a DexCom CGM: please provide the office with the email account associated with your Dexcom.  We will send you an email invitation to share your data.  Please then log into your Dexcom account to accept the invitation, consent to share data with the clinic, and tap "Yes, Share My Data."   Important general information about using a continuous glucose sensor (CGM): Do not inject insulin within 1-3 inches of the sensor. Remove sensor if you are having MRI, CT, X-ray. Rotate sensor sites. If you stop wearing your CGM for a period of time, please resume fingerstick blood sugar checks at least 4 times a day.   Please contact Dexcom for help with troubleshooting.     Please scan your continuous glucose monitor sensor before meals, and one hour after meals.  The goal for your blood glucose before meals is 90 - 130.  The goal for your blood glucose one hour after meals is 100 - 180.     Diabetic Medication/Insulin Instructions:  Increase your Basaglar insulin dose to 64 units  once a day     For your Novolog, continue 4 units before breakfast, and 6 units before lunch and 10 units before supper, and add correction scale:    If your before meal blood glucose is:  150 - 200, add 2 units Novolog  201- 250, add 4 units Novolog  251-300,  add 6 units Novolog  301 - 350, add 8 units Novolog  351 - 400, add 10 units Novolog and notify the office  .    Low Blood Sugar Prevention and Treatment:   Please carry fast-acting carbohydrate source (preferably glucose tablets) with you at all times. Please treat low blood sugar with 15 grams carbohydrate (4 glucose tablets), wait 15 minutes, and recheck sugar. Re-treat if necessary.   Please notify office for any episode of severe hypoglycemia, such as those requiring assistance from someone else.     Aspirin: Please continue taking low-dose aspirin.    Diet:   Please decrease your intake of simple sugars and starches. Please watch your portions.  Please try to avoid missing meals.    Activity:   Follow your podiatrist and surgeon's instructions.  you can do arm exercises for 10 minutes a day.     Foot Care:   Please examine feet daily and appropriately treat callouses or lesions. Please avoid being barefoot or wearing of ill fitting shoes. Please use lotion as needed to treat dry skin on feet.  Please follow up with your podiatrist regarding recent foot surgery, routine foot care    Diabetic Education:   We did not discuss diabetic education today. If you would like to follow up with a diabetic educator, please call the office and we will arrange for this    Screening for Health Complications of Diabetes:   Diabetic screening is up-to-date except for a dental exam. Please schedule an appointment with your dentist.  Free dental cleanings are available through the [facility] Dental hygeine program. [phone] You may wish to consider sedation dentistry. One provider is Dr. Burnadette Peter at [facility], [phone].    The following additional  instructions relate to health conditions other than your diabetes:   Cholesterol/lipids: Your cholesterol is in good range but your triclyerides are elevated and your HDL ("good" cholesterol") is low. We have reviewed that decreased intake of "simple" sugars (including fruit, fruit juice, other sweet drinks and sugary foods) can help to improve your triglycerides. Increased exercise can help raise your HDL.     Blood Pressure: Please monitor your blood pressure regularly. Your blood pressure goal is 130/80.  Please contact your PCP if your blood pressure remains above goal.   Please follow up with your primary care provider regarding planning surgery on your right foot, and hospital discharge follow up   Continue to follow with your kidney doctor, your podiatrist and your surgeon at [facility].       Return in about 4 weeks (around 04/27/2020).    Maricela Bo, NP           Low Blood Sugar (Hypoglycemia)   Having too little sugar (glucose) in your blood is called low blood sugar (hypoglycemia). Low blood sugar often means anything lower than 70 mg/dL. Talk with your healthcare provider about your target range. Ask what level is too low for you. Diabetes itself doesnt cause low blood sugar. But some treatments for diabetes may raise your risk for it. These include pills or insulin. Low blood sugar may make you pass out or have a seizure. So always treat low blood sugar right away. But don't overeat.   Special note  Always carry a source of fast-acting sugar and a snack in case of hypoglycemia. Examples include 4 glucose tablets or 1 tube of glucose gel, 1 packet of sugar or honey, or 2 tablespoons of raisins.   What you may notice   If you have low blood sugar, you may have one or more of these symptoms:    Shakiness or dizziness   Cold, clammy skin or sweating   Feeling hungry   Headache   Nervousness   A hard, fast heartbeat   Weakness   Confusion or irritability   Blurred eyesight   Having nightmares or  waking up confused or sweating   Numbness or tingling in the lips or tongue  What you should do   Here are tips to follow if you have hypoglycemia:    First check your blood sugar. If it's too low (out of your target range), eat or drink 15 to 20 grams of fast-acting sugar. This may be 3 to 4 glucose tablets, 4 ounces (half a cup) of fruit juice or regular (nondiet) soda, or 1 tablespoon of honey. Dont take more than this, or your blood sugar may go too high.   Don't eat foods high in protein such as milk or nuts to treat hypoglycemia. Protein may increase your insulin response. It may lower your blood  sugar even more.   Wait 15 minutes. Then recheck your blood sugar if you can.   If your blood sugar is still too low, repeat the steps above until your blood sugar is back to normal.   Once your blood sugar is back at target range, eat a snack or meal.  If you still dont feel well and your blood sugar is still low, have someone drive you to your healthcare providers office or the hospital emergency room.   You may also want to talk with your provider to see if you should be prescribed a glucagon shot. Glucagon is a hormone that quickly raises blood sugar. It can reverse serious symptoms.   Preventing low blood sugar   Things you can do include the following:   If your condition needs a strict treatment plan,eat meals and snacks at the same times each day. Dont skip meals!   If your treatment plan lets you change when and what you eat, learn how to change the time and dose of your rapid-acting insulin to match this.   Ask your healthcare provider if it's safe to drink alcohol. Never drink on an empty stomach.   Take your medicine at the prescribed times.   Always carry a source of fast-acting sugar and a snack when youre away from home.   If you have had several hypoglycemic episodes, talk with your healthcare provider. Ask if you can take less medicine. Many newer types of diabetes pills and  injections have less risk of causing hypoglycemia than some older medicines. You also may have a condition where you no longer recognize the symptoms of low blood sugar until the value falls to dangerous levels.  Other things to do   Other tips include:   Carry a medical ID card or wear a medical alert bracelet or necklace. It should say that you have diabetes. It should also say what to do if you pass out or have a seizure.   Make sure your family, friends, and coworkers know the signs of low blood sugar. Tell them what to do if your blood sugar falls very low and you cant treat yourself.   Keep a glucagon emergency kit handy. Be sure your family, friends, and coworkers know how and when to use it. Check it often. Replace the glucagon before it expires.   Talk with your healthcare team about other things you can do to prevent low blood sugar. These include using new ways of continuous glucose monitoring.  Important  If you haveunexplained hypoglycemia or hypoglycemia several times, call your healthcare provider.   [employer] last reviewed this educational content on 09/15/2019     2000-2021 [employer]. All rights reserved. This information is not intended as a substitute for professional medical care. Always follow your healthcare professional's instructions.

## 2020-04-04 NOTE — Progress Notes (Signed)
Note reviewed. Agree with assessment and plan as outlined by Ms. Wenzel, FNP-BC.  Karem Farha A. Kemuel Buchmann, MD, FACP, FACE

## 2020-04-07 ENCOUNTER — Ambulatory Visit
Admission: RE | Admit: 2020-04-07 | Discharge: 2020-04-07 | Disposition: A | Discharge status: Home / Self Care | Payer: BC Managed Care – PPO | Source: Ambulatory Visit | Attending: Internal Medicine | Admitting: Internal Medicine

## 2020-04-07 DIAGNOSIS — N289 Disorder of kidney and ureter, unspecified: Secondary | ICD-10-CM | POA: Insufficient documentation

## 2020-04-08 DIAGNOSIS — M14679 Charcot's joint, unspecified ankle and foot: Secondary | ICD-10-CM | POA: Insufficient documentation

## 2020-04-08 DIAGNOSIS — M14671 Charcot's joint, right ankle and foot: Secondary | ICD-10-CM | POA: Insufficient documentation

## 2020-04-10 ENCOUNTER — Ambulatory Visit: Payer: BC Managed Care – PPO

## 2020-04-16 ENCOUNTER — Telehealth: Payer: Self-pay

## 2020-04-16 ENCOUNTER — Other Ambulatory Visit (RURAL_HEALTH_CENTER): Payer: Self-pay | Admitting: Internal Medicine

## 2020-04-16 DIAGNOSIS — R339 Retention of urine, unspecified: Secondary | ICD-10-CM

## 2020-04-16 NOTE — Telephone Encounter (Signed)
Left Message - In regards to apoointment with DMP on 3/4 at 11 am.      By Lavone Nian, RD

## 2020-04-17 ENCOUNTER — Encounter (INDEPENDENT_AMBULATORY_CARE_PROVIDER_SITE_OTHER): Payer: Self-pay

## 2020-04-17 ENCOUNTER — Ambulatory Visit: Payer: BC Managed Care – PPO | Attending: Family

## 2020-04-17 VITALS — Wt 251.9 lb

## 2020-04-17 DIAGNOSIS — Z713 Dietary counseling and surveillance: Secondary | ICD-10-CM | POA: Insufficient documentation

## 2020-04-17 DIAGNOSIS — E1165 Type 2 diabetes mellitus with hyperglycemia: Secondary | ICD-10-CM | POA: Insufficient documentation

## 2020-04-17 DIAGNOSIS — Z794 Long term (current) use of insulin: Secondary | ICD-10-CM | POA: Insufficient documentation

## 2020-04-17 NOTE — Progress Notes (Signed)
[facility]  Outpatient Nutrition Clinic Progress Note  Phone: [phone]    To: Lorina Rabon, NP    Thank you for referring [name] for Medical Nutrition Therapy.    Date: 04/17/2020  Time spent: 1 Hour -  initial  consult.    Medical Diagnosis:   1. Type 2 diabetes mellitus with hyperglycemia, with long-term current use of insulin      Nutrition Assessment:   Anthropometric Measurements -   Height: 5'6" / Weight: 114.3 kg (251 lb 14.4 oz) / BMI 40    Estimated needs: 1600 - 1800 kcals/d    Medical History, Physical Exam Findings, Biochemical Data:  A pleasant, obese gentleman with long-term DM. Labs: 03/27/20  Chol 186, Trig 364, HDL 33, LDL 80, EGFR 32, Hgb AIC 7.1. Uses CGM.    Current Outpatient Medications   Medication Sig Dispense Refill   . insulin glargine (BASAGLAR KWIKPEN) 100 UNIT/ML injection pen Inject 64 Units into the skin nightly 60 mL 1   . liraglutide (Victoza) 18 MG/3ML injection INJECT 1.'8MG'$  SUBCUTANEOUSLY ONCE DAILY 9 mL 5   . NovoLOG FlexPen 100 UNIT/ML injection pen INJECT SUBCUTANEOUSLY three times a day PRIOR TO MEALS PER CORRECTION SCALE UP TO 40 UNITS DAILY 15 mL 3   . amLODIPine (NORVASC) 5 MG tablet Take 1 tablet (5 mg total) by mouth daily 30 tablet 11   . aspirin EC 81 MG EC tablet Take 1 tablet (81 mg total) by mouth every evening. 90 tablet 3   . atorvastatin (LIPITOR) 40 MG tablet TAKE 1 TABLET BY MOUTH ONCE DAILY 90 tablet 2   . Blood Glucose Monitoring Suppl (RELION PRIME MONITOR) Device by Does not apply route USE TO TEST BLOOD GLUCOSE FOUR TIMES DAILY     . Continuous Blood Gluc Receiver (Dexcom G6 Receiver) Device 1 Device by Does not apply route daily 1 Device 0   . Continuous Blood Gluc Sensor (Dexcom G6 Sensor) Misc 1 each by Does not apply route every 10 (ten) days ICD 10 code:  E11.65 9 each 3   . Continuous Blood Gluc Transmit (Dexcom G6 Transmitter) Misc Inject 1 Device into the skin daily ICD 10 code:  E11.65 1 each 0   . gabapentin  (NEURONTIN) 300 MG capsule Take 1 capsule (300 mg total) by mouth 3 (three) times daily 90 capsule 5   . Glucagon (Baqsimi Two Pack) 3 MG/DOSE Powder 1 each by Nasal route as needed (severe HYPOglycemia) 1 each 1   . glucose blood (RELION PRIME TEST) test strip 1 each by Other route 4 (four) times daily Use as instructed     . Insulin Pen Needle 31G X 6 MM Misc by Does not apply route.Use up to 7 times a day as directed     . Multiple Vitamin (multivitamin) capsule Take 1 capsule by mouth daily     . ondansetron (ZOFRAN) 8 MG tablet Take 1 tablet (8 mg total) by mouth every 6 (six) hours as needed for Nausea 10 tablet 0   . Vitamin D3 (CHOLECALCIFEROL) 50 MCG (2000 UT) tablet Take 1 tablet (50 mcg total) by mouth daily       No current facility-administered medications for this visit.       Food Allergies:  N/A    Food, Nutrition History, Current Eating Habits:  Eats 2 meals a day and some snacks; states he has never eaten breakfast because he gets sick. Eats brunch 1 egg, 1 English muffin, and  water; Early dinner 2 chicken patties, 1 cup peas, 2 slices whole wheat bread; S; 1 1/2 cup fresh strawberries. Likes vegetables, fruits and whole bread. Drinks mainly water. Aware that he needs to be careful with convenience foods due to the high content of Na.    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   Nutrition Diagnosis (problem/etiology/symptoms:      Obesity r/t to poor dietary choices, chronic illness, environment aeb CBW/BMI, medical hx and lack of  exercise  Nutrition Intervention/Goals:   Recommended modifications: - Use MyPlate method  - Limit intake of Na and the use of convenience foods  - Eat regular meals with 3-4 servings of carbohydrates and 1-2 for snacks  - Try do some lifting to increase activity    (Educational handouts provided to support topics discussed). Provided written information on CHO's, food label reading, and menu planning.  Nutrition Monitoring/Evaluation/Follow up:     - Wt gain    Follow up: March 31 at 11 am.    Lavone Nian, RD

## 2020-04-28 NOTE — Progress Notes (Deleted)
Endocrine Provider Progress Note      Patient Name:  [name] [id number] DOB: 1965/09/07  Date: 04/28/2020    Subjective:          [name] is a 55 y.o. male who presents for follow up of type 2 diabetes mellitus.   Since the last visit, the patient has had problems with swelling and pain in right foot; went to his Podiatrist, Dr. Nada Boozer and diagnosed with Charcot foot; referred to [facility] for surgery.   Reports he met with Dr. Rico Junker at [facility], and is completing pre-op testing before his surgery will be scheduled at [facility].  Since he stopped working last month, and is less active, he has noticed his BG readings are trending up. He has not adjusted insulin doses.   Is following with Nephrology in [place], [place].    His PCP did recent lab work, and potassium level was elevated, and he was instructed to stop Lisinopril, and avoid potassium rich foods, and start Amlodipine.  He denies chest pain, palpitations, muscle cramping, abdominal pain, nausea, vomiting or diarrhea.        Monitoring, Compliance and Complications:  Diagnosis Date: ~2000-2001  Prior Diabetic Treatment: Wilder Glade (discontinued due to renal function)   Hgb A1c at time of referral: 11.1% (done 12/20/16).  HGB A1C goal based on age and comorbidities is 7%.  The patient has the following diabetic complications:  retinopathy, nephropathy and neuropathy, history of foot ulcer.  He has had multiple surgeries for osteomyelitis on his left foot in 2017.     Glucose Meter: Reli-On, not sure which model  Glucose Meter Validation Date: never  The patient did not bring the glucose meter or glucose readings today.  Compliance with blood glucose monitoring: fair   The patient is not currently testing home blood glucose.   Glucose readings are as follows:  -    CGM Type:  Dexcom G6  CGM use fequency:  all of the time   Calibration frequency: This CGM does not require callibration  Percent of time in target range: 60  For additional CGM data, see scanned  CGM download attached to this visit note.   Average sensor glucose: 172  Percent of time above target range: 38% high, 2% very high  Percent of time below target range: 0   Tracings from March 17, 2020 through March 30, 2020 were reviewed today.  Higher BG trends 6pm to 9pm; correlates with supper intake, which is his largest meal.   Readings from 6am - 8am range 150 - 200.      Injections:   Injections are given by patient.   Injections sites include: abdominal wall  Injection compliance: The patient never misses injections.  The patient reports some bruising at injection sites. with basaglar   Will take Novolog before meals for glucose >120; currently taking 4 units before breakfast; 6 units before lunch and 10 units before supper.      Diet, Exercise, and Weight:     The patient tries to watch portions, starches and sweets.  Compliance with diet has been variable.   Exercise: The patient is unable to exercise due to diagnosis of Charcot foot on right. .   Weight: Weight has been stable since last visit.    Hypoglycemia:   The patient has experienced mild occasional hypoglycemia since the last visit. Symptoms of hypoglycemia include: hunger and shaking, blurred vision, nausea. . The patient treats hypoglycemia with carb snack, glucose tablets  The patient has Nasal Glucagon (Baqsimi). This is up-to-date and family members have been instructed in its use.   The patient does not have a medical alert bracelet or necklace.    Diabetic Education: The patient has never received diabetes education.  Has it scheduled for 05/14/2020.        Diabetic Complication Screening/Treatment  Eye exam: Dr. Deirdre Pippins. Last visit was 03/17/2020.  No evidence of diabetic retinopathy on last eye exam.  Urine microalbumin/creatinine ratio: N/A. The patient has CKD followed by nephrologist.    Foot Care: The patient reports continued problems with  neuropathic pain  Most recent appointment with Dr. Nada Boozer 01/21/2020  Monofilament:  03/30/2020, not intact   Aspirin Therapy: The patient is currently taking aspirin at dose indicated in the medication list.   Cardiovascular risk factors: diabetes mellitus, dyslipidemia, hypertension, male gender, obesity (BMI >= 30 kg/m2), sedentary lifestyle and smoking/ tobacco exposure  Ace Inhibitor/ARB: The patient discontinued ACE-I/ARB due to elevated potassium, and PCP instructed to stop ACE-I .  Dental care: Patient has received regular dental care, most recently Feb-March 2021 Had 4 teeth extracted    Current Outpatient Medications   Medication Sig Dispense Refill    amLODIPine (NORVASC) 5 MG tablet Take 1 tablet (5 mg total) by mouth daily 30 tablet 11    aspirin EC 81 MG EC tablet Take 1 tablet (81 mg total) by mouth every evening. 90 tablet 3    atorvastatin (LIPITOR) 40 MG tablet TAKE 1 TABLET BY MOUTH ONCE DAILY 90 tablet 2    Blood Glucose Monitoring Suppl (RELION PRIME MONITOR) Device by Does not apply route USE TO TEST BLOOD GLUCOSE FOUR TIMES DAILY      Continuous Blood Gluc Receiver (Dexcom G6 Receiver) Device 1 Device by Does not apply route daily 1 Device 0    Continuous Blood Gluc Sensor (Dexcom G6 Sensor) Misc 1 each by Does not apply route every 10 (ten) days ICD 10 code:  E11.65 9 each 3    Continuous Blood Gluc Transmit (Dexcom G6 Transmitter) Misc Inject 1 Device into the skin daily ICD 10 code:  E11.65 1 each 0    gabapentin (NEURONTIN) 300 MG capsule Take 1 capsule (300 mg total) by mouth 3 (three) times daily 90 capsule 5    Glucagon (Baqsimi Two Pack) 3 MG/DOSE Powder 1 each by Nasal route as needed (severe HYPOglycemia) 1 each 1    glucose blood (RELION PRIME TEST) test strip 1 each by Other route 4 (four) times daily Use as instructed      insulin glargine (BASAGLAR KWIKPEN) 100 UNIT/ML injection pen Inject 64 Units into the skin nightly 60 mL 1    Insulin Pen Needle 31G X 6 MM Misc by Does not apply route.Use up to 7 times a day as directed      liraglutide (Victoza)  18 MG/3ML injection INJECT 1.'8MG'$  SUBCUTANEOUSLY ONCE DAILY 9 mL 5    Multiple Vitamin (multivitamin) capsule Take 1 capsule by mouth daily      NovoLOG FlexPen 100 UNIT/ML injection pen INJECT SUBCUTANEOUSLY three times a day PRIOR TO MEALS PER CORRECTION SCALE UP TO 40 UNITS DAILY 15 mL 3    ondansetron (ZOFRAN) 8 MG tablet Take 1 tablet (8 mg total) by mouth every 6 (six) hours as needed for Nausea 10 tablet 0    Vitamin D3 (CHOLECALCIFEROL) 50 MCG (2000 UT) tablet Take 1 tablet (50 mcg total) by mouth daily       No  current facility-administered medications for this visit.      Medication review with [name] suggested compliance all of the time.       Immunization History   Administered Date(s) Administered    Td 06/14/2009    Td,preservative free 06/14/2009    Tdap 07/06/2011     The following portions of the patient's history were reviewed and updated as appropriate: current medications, past medical history and problem list.  The following information was also reviewed at today's visit: lab data and continuous glucose sensor download data    Review of Systems  As above.      Objective:      Vital Signs: There were no vitals taken for this visit.       PE  Well nourished, well appearing, in no acute distress.  HEENT:  There is a slight stare.  There is no lid lag. There is no periorbital edema. Extraoccular movements are intact. No conjunctival injection.  oral mucosa not examined. Patient wearing mask.  Neck is supple without adenopathy.  The thyroid is normal in size and consistency and no nodules are appreciated. The thyroid is normal on inspection.  Lungs are clear to auscultation.  Cardiac exam reveals a regular rate and rhythm. No murmur or gallop is appreciated.   Exam of the extremities reveals +1 bilateral ankle edema.     Posterior tibial and doralis pedis pulses are normal.  There is deformity of the right foot, including flat foot.  Callouses are noted heels. Onychomycosis is noted  in multiple nails on both feet.  On neurologic exam, the patient is alert and appropriate.  Speech is normal.  There is no tremor of the outstretched hands. Monofilament is intact  great toe right and left but absent  in the metarasal heads bilaterally.   Skin is warm and dry.     Lab Review    Lab Results   Component Value Date    HGBA1CPERCNT 7.1 03/27/2020    HGBA1CPERCNT 7.3 10/19/2019    HGBA1CPERCNT 7.9 06/06/2019    TSH 1.33 10/19/2019    CHOL 186 03/27/2020    HDL 33 (L) 03/27/2020    LDL 80 03/27/2020    TRIG 364 (H) 03/27/2020    ALT 31 03/27/2020    B12 419 09/13/2017    VITD 19.6 (L) 02/14/2019    GLU 169 (H) 03/27/2020    K 5.0 03/27/2020    CA 8.7 03/27/2020    CREAT 2.19 (H) 03/27/2020    CO2 23.1 03/27/2020    EGFR 32 (L) 03/27/2020    NA 140 03/27/2020    ALKPHOS 128 03/06/2020       03/09/19 vitamin D2/D3 total 21 (normal 20-50)    Assessment:      No diagnosis found.  Diabetes: A1c within goal but diabetes control suboptimal based on variability in glucose readings. Regimen adjusted as outlined below.  Based on CGM data reviewed; Insulin adjustments as instructed below.  Will avoid use of SGLT2i (such as Farxiga) due to history of foot ulcer.  Emphasized importance of glycemic control especially with upcoming surgery.     Lipids: We have reviewed that the triglycerides are above goal. We have discussed that they are likely to improve with decreased intake of simple sugars and improved diabetic control.     Hypertension; B/P above goal today; instructed to monitor home B/P; reviewed goal for B/P; and rationale for checking this at home with recent medication change.  Vitamin D deficiency; on OTC supplement; will recheck level.          Plan:   Below is a summary of information and instructions we reviewed at today's appointment. Please review this information carefully and call if you have any questions regarding it.     Diabetes information/instructions:   Labwork: You are due for lab work  on  or after 06/24/2020 .   You should fast for 10 hours prior to having this labwork done.    Self-monitoring of blood glucose: We recommend that you monitor your blood glucose before breakfast, lunch, dinner and bedtime, and as needed for signs and/or symptoms of hypoglycemia. Please fax, email, drop off or MyChartMessage your blood sugar readings for further adjustment of regimen  if at anytime you notice too many high or low blood sugar readings  Sharing your CGM data between visits:  We can make more progress helping you improve blood sugar control if we review your CGM data between visits. Please let us know when you have uploaded your device so we can review your data each time.   To share data from a DexCom CGM: please provide the office with the email account associated with your Dexcom.  We will send you an email invitation to share your data.  Please then log into your Dexcom account to accept the invitation, consent to share data with the clinic, and tap "Yes, Share My Data."   Important general information about using a continuous glucose sensor (CGM): Do not inject insulin within 1-3 inches of the sensor. Remove sensor if you are having MRI, CT, X-ray. Rotate sensor sites. If you stop wearing your CGM for a period of time, please resume fingerstick blood sugar checks at least 4 times a day.   Please contact Dexcom for help with troubleshooting.     Please scan your continuous glucose monitor sensor before meals, and one hour after meals.  The goal for your blood glucose before meals is 90 - 130.  The goal for your blood glucose one hour after meals is 100 - 180.     Diabetic Medication/Insulin Instructions:  Increase your Basaglar insulin dose to 64 units once a day     For your Novolog, continue 4 units before breakfast, and 6 units before lunch and 10 units before supper, and add correction scale:    If your before meal blood glucose is:  150 - 200, add 2 units Novolog  201- 250, add 4 units  Novolog  251-300,  add 6 units Novolog  301 - 350, add 8 units Novolog  351 - 400, add 10 units Novolog and notify the office  .    Low Blood Sugar Prevention and Treatment:   Please carry fast-acting carbohydrate source (preferably glucose tablets) with you at all times. Please treat low blood sugar with 15 grams carbohydrate (4 glucose tablets), wait 15 minutes, and recheck sugar. Re-treat if necessary.   Please notify office for any episode of severe hypoglycemia, such as those requiring assistance from someone else.     Aspirin: Please continue taking low-dose aspirin.    Diet:   Please decrease your intake of simple sugars and starches. Please watch your portions.  Please try to avoid missing meals.    Activity:   Follow your podiatrist and surgeon's instructions.  Please try to do arm exercises for 10 minutes a day.     Foot Care:   Please examine feet daily and appropriately treat callouses or  lesions. Please avoid being barefoot or wearing of ill fitting shoes. Please use lotion as needed to treat dry skin on feet.  Please follow up with your podiatrist regarding recent foot surgery, routine foot care    Diabetic Education:   Please keep your upcoming appointment on 05/14/2020 for diabetes education.     Screening for Health Complications of Diabetes:   Diabetic screening is up-to-date except for a dental exam. Please schedule an appointment with your dentist.  Free dental cleanings are available through the [facility] Dental hygeine program. [phone] You may wish to consider sedation dentistry. One provider is Dr. Burnadette Peter at [facility], [phone].    The following additional instructions relate to health conditions other than your diabetes:   Cholesterol/lipids: Your cholesterol is in good range but your triclyerides are elevated and your HDL ("good" cholesterol") is low. We have reviewed that decreased intake of "simple" sugars (including fruit, fruit juice, other sweet drinks and sugary  foods) can help to improve your triglycerides. Increased exercise can help raise your HDL.     Blood Pressure: Please monitor your blood pressure regularly. Your blood pressure goal is 130/80.  Please contact your PCP if your blood pressure remains above goal.   Please follow up with your primary care provider regarding planning surgery on your right foot, and hospital discharge follow up   Continue to follow with your kidney doctor, your podiatrist and your surgeon at [facility].       No follow-ups on file.      Maricela Bo, NP

## 2020-05-05 ENCOUNTER — Ambulatory Visit (RURAL_HEALTH_CENTER): Payer: BC Managed Care – PPO | Admitting: Family

## 2020-05-12 ENCOUNTER — Telehealth: Payer: Self-pay

## 2020-05-12 NOTE — Telephone Encounter (Signed)
Left Message - In reference to appointment for DMP on 3/31 at 11 am.      By Lavone Nian, RD

## 2020-05-14 ENCOUNTER — Ambulatory Visit: Payer: BC Managed Care – PPO

## 2020-05-14 VITALS — Wt 247.9 lb

## 2020-05-14 DIAGNOSIS — E1165 Type 2 diabetes mellitus with hyperglycemia: Secondary | ICD-10-CM

## 2020-05-14 DIAGNOSIS — Z794 Long term (current) use of insulin: Secondary | ICD-10-CM

## 2020-05-14 NOTE — Progress Notes (Signed)
S: I'm having a hard time with my knee and feet; I need to get surgery, but I'm not going to have insurance very soon in April. I've applied for disability but nothing so far. [facility] can [facility] get my medications there. Also, I don't know what to do with my Dexcom once I don't have insurance. My wife works here and I can get on her insurance, but I still need to wait for [facility] Sanger to drop me off because I cannot quit otherwise is against me.      O: BG am 123 today and 108 (3/30). Wt lost of 4 lbs in 4 weeks.    A: Takes medications: Gargline 64 U, Novolog 40 U with meals on scale basis, Victoza 1.8 mg daily.   Eats meals on regular times most days. Does not remember how many carbohydrates he needs to eat per meal.   Unable to do much walking due to his knee pain and feet.    P: Follow MyPlate Method  - Eat 3-4 servings of carbohydrates per meals and 1-2 for snacks  - Continue working on portion control    Received Choose Your Foods/Food Lists for Diabetes book and a list of Potassium foods.      Next appointment with RD on August 13, 2020

## 2020-05-15 HISTORY — PX: FOOT SURGERY: SHX648

## 2020-05-21 ENCOUNTER — Other Ambulatory Visit
Admission: RE | Admit: 2020-05-21 | Discharge: 2020-05-21 | Disposition: A | Discharge status: Home / Self Care | Payer: BC Managed Care – PPO | Source: Ambulatory Visit | Attending: Student in an Organized Health Care Education/Training Program | Admitting: Student in an Organized Health Care Education/Training Program

## 2020-05-22 LAB — VH APTIMA SARS-COV-2 ASSAY (PANTHER SYSTEM)(TM): Aptima SARS-CoV-2: NEGATIVE

## 2020-05-27 ENCOUNTER — Other Ambulatory Visit (RURAL_HEALTH_CENTER): Payer: Self-pay | Admitting: Internal Medicine

## 2020-06-15 NOTE — Progress Notes (Unsigned)
Endocrine Provider Progress Note      Patient Name:  [name] [id number] DOB: Feb 22, 1965  Date: 06/15/2020    Subjective:          [name] is a 55 y.o. male who presents for follow up of type 2 diabetes mellitus.   Since the last visit, the patient has had problems with swelling and pain in right foot; went to his Podiatrist, Dr. Nada Boozer and diagnosed with Charcot foot; referred to [facility] for surgery.   Reports he met with Dr. Rico Junker at [facility], and is completing pre-op testing before his surgery will be scheduled at [facility].  Since he stopped working last month, and is less active, he has noticed his BG readings are trending up. He has not adjusted insulin doses.   Is following with Nephrology in [place], [place].    His PCP did recent lab work, and potassium level was elevated, and he was instructed to stop Lisinopril, and avoid potassium rich foods, and start Amlodipine.  He denies chest pain, palpitations, muscle cramping, abdominal pain, nausea, vomiting or diarrhea.        Monitoring, Compliance and Complications:  Diagnosis Date: ~2000-2001  Prior Diabetic Treatment: Wilder Glade (discontinued due to renal function) -patient reports today Wilder Glade was stopped during hospitalization for osteomyelitis.   Hgb A1c at time of referral: 11.1% (done 12/20/16).  HGB A1C goal based on age and comorbidities is 7%.  The patient has the following diabetic complications:  retinopathy, nephropathy and neuropathy, history of foot ulcer.  He has had multiple surgeries for osteomyelitis on his left foot in 2017.     Glucose Meter: Reli-On, not sure which model  Glucose Meter Validation Date: never  The patient did not bring the glucose meter or glucose readings today.  Compliance with blood glucose monitoring: fair   The patient is not currently testing home blood glucose.   Glucose readings are as follows:  -    CGM Type:  Dexcom G6  CGM use fequency:  all of the time   Calibration frequency: This CGM does not require  callibration  Percent of time in target range: 60  For additional CGM data, see scanned CGM download attached to this visit note.   Average sensor glucose: 172  Percent of time above target range: 38% high, 2% very high  Percent of time below target range: 0   Tracings from March 17, 2020 through March 30, 2020 were reviewed today.  Higher BG trends 6pm to 9pm; correlates with supper intake, which is his largest meal.   Readings from 6am - 8am range 150 - 200.      Injections:   Injections are given by patient.   Injections sites include: abdominal wall  Injection compliance: The patient never misses injections.  The patient reports some bruising at injection sites. with basaglar   Will take Novolog before meals for glucose >120; currently taking 4 units before breakfast; 6 units before lunch and 10 units before supper.      Diet, Exercise, and Weight:     The patient tries to watch portions, starches and sweets.  Compliance with diet has been variable.   Exercise: The patient is unable to exercise due to diagnosis of Charcot foot on right. .   Weight: Weight has been stable since last visit.    Hypoglycemia:   The patient has experienced mild occasional hypoglycemia since the last visit. Symptoms of hypoglycemia include: hunger and shaking, blurred vision, nausea. . The  patient treats hypoglycemia with carb snack, glucose tablets    The patient has Nasal Glucagon (Baqsimi). This is up-to-date and family members have been instructed in its use.   The patient does not have a medical alert bracelet or necklace.    Diabetic Education: The patient has never received diabetes education.         Diabetic Complication Screening/Treatment  Eye exam: Dr. Deirdre Pippins. Last visit was 03/17/2020.  No evidence of diabetic retinopathy on last eye exam.  Urine microalbumin/creatinine ratio: N/A. The patient has CKD followed by nephrologist.    Foot Care: The patient reports continued problems with  neuropathic pain  Most  recent appointment with Dr. Nada Boozer 01/21/2020  Monofilament: 03/30/2020, not intact   Aspirin Therapy: The patient is currently taking aspirin at dose indicated in the medication list.   Cardiovascular risk factors: diabetes mellitus, dyslipidemia, hypertension, male gender, obesity (BMI >= 30 kg/m2), sedentary lifestyle and smoking/ tobacco exposure  Ace Inhibitor/ARB: The patient discontinued ACE-I/ARB due to elevated potassium, and PCP instructed to stop ACE-I .  Dental care: Patient has received regular dental care, most recently Feb-March 2021 Had 4 teeth extracted    Current Outpatient Medications   Medication Sig Dispense Refill   . amLODIPine (NORVASC) 5 MG tablet TAKE 1 TABLET (5 MG TOTAL) BY MOUTH DAILY. 30 tablet 11   . aspirin EC 81 MG EC tablet Take 1 tablet (81 mg total) by mouth every evening. 90 tablet 3   . atorvastatin (LIPITOR) 40 MG tablet TAKE 1 TABLET BY MOUTH ONCE DAILY 90 tablet 2   . Blood Glucose Monitoring Suppl (RELION PRIME MONITOR) Device by Does not apply route USE TO TEST BLOOD GLUCOSE FOUR TIMES DAILY     . Continuous Blood Gluc Receiver (Dexcom G6 Receiver) Device 1 Device by Does not apply route daily 1 Device 0   . Continuous Blood Gluc Sensor (Dexcom G6 Sensor) Misc 1 each by Does not apply route every 10 (ten) days ICD 10 code:  E11.65 9 each 3   . Continuous Blood Gluc Transmit (Dexcom G6 Transmitter) Misc Inject 1 Device into the skin daily ICD 10 code:  E11.65 1 each 0   . gabapentin (NEURONTIN) 300 MG capsule Take 1 capsule (300 mg total) by mouth 3 (three) times daily 90 capsule 5   . Glucagon (Baqsimi Two Pack) 3 MG/DOSE Powder 1 each by Nasal route as needed (severe HYPOglycemia) 1 each 1   . glucose blood (RELION PRIME TEST) test strip 1 each by Other route 4 (four) times daily Use as instructed     . insulin glargine (BASAGLAR KWIKPEN) 100 UNIT/ML injection pen Inject 64 Units into the skin nightly 60 mL 1   . Insulin Pen Needle 31G X 6 MM Misc by Does not apply route.Use  up to 7 times a day as directed     . liraglutide (Victoza) 18 MG/3ML injection INJECT 1.'8MG'$  SUBCUTANEOUSLY ONCE DAILY 9 mL 5   . Multiple Vitamin (multivitamin) capsule Take 1 capsule by mouth daily     . NovoLOG FlexPen 100 UNIT/ML injection pen INJECT SUBCUTANEOUSLY three times a day PRIOR TO MEALS PER CORRECTION SCALE UP TO 40 UNITS DAILY 15 mL 3   . ondansetron (ZOFRAN) 8 MG tablet Take 1 tablet (8 mg total) by mouth every 6 (six) hours as needed for Nausea 10 tablet 0   . Vitamin D3 (CHOLECALCIFEROL) 50 MCG (2000 UT) tablet Take 1 tablet (50 mcg total) by mouth daily  No current facility-administered medications for this visit.      Medication review with [name] suggested compliance all of the time.       Immunization History   Administered Date(s) Administered   . Td 06/14/2009   . Td,preservative free 06/14/2009   . Tdap 07/06/2011     The following portions of the patient's history were reviewed and updated as appropriate: current medications, past medical history and problem list.  The following information was also reviewed at today's visit: lab data and continuous glucose sensor download data    Review of Systems  As above.      Objective:      Vital Signs: There were no vitals taken for this visit.     B/P recheck:  140/82.    PE  Well nourished, well appearing, in no acute distress.  HEENT:  There is a slight stare.  There is no lid lag. There is no periorbital edema. Extraoccular movements are intact. No conjunctival injection.  oral mucosa not examined. Patient wearing mask.  Neck is supple without adenopathy.  The thyroid is normal in size and consistency and no nodules are appreciated. The thyroid is normal on inspection.  Lungs are clear to auscultation.  Cardiac exam reveals a regular rate and rhythm. No murmur or gallop is appreciated.   Exam of the extremities reveals +1 bilateral ankle edema.     Posterior tibial and doralis pedis pulses are normal.  There is deformity of the  right foot, including flat foot.  Callouses are noted heels. Onychomycosis is noted in multiple nails on both feet.  On neurologic exam, the patient is alert and appropriate.  Speech is normal.  There is no tremor of the outstretched hands. Monofilament is intact  great toe right and left but absent  in the metarasal heads bilaterally.   Skin is warm and dry.     Lab Review    Lab Results   Component Value Date    HGBA1CPERCNT 7.1 03/27/2020    HGBA1CPERCNT 7.3 10/19/2019    HGBA1CPERCNT 7.9 06/06/2019    TSH 1.33 10/19/2019    CHOL 186 03/27/2020    HDL 33 (L) 03/27/2020    LDL 80 03/27/2020    TRIG 364 (H) 03/27/2020    ALT 31 03/27/2020    B12 419 09/13/2017    VITD 19.6 (L) 02/14/2019    GLU 169 (H) 03/27/2020    K 5.0 03/27/2020    CA 8.7 03/27/2020    CREAT 2.19 (H) 03/27/2020    CO2 23.1 03/27/2020    EGFR 32 (L) 03/27/2020    NA 140 03/27/2020    ALKPHOS 128 03/06/2020       03/09/19 vitamin D2/D3 total 21 (normal 20-50)    Assessment:      No diagnosis found.  Diabetes: A1c within goal but diabetes control suboptimal based on variability in glucose readings. Regimen adjusted as outlined below.  Based on CGM data reviewed; Insulin adjustments as instructed below.  Will avoid use of SGLT2i (such as Farxiga) due to history of foot ulcer.  Emphasized importance of glycemic control especially with upcoming surgery.     Lipids: We have reviewed that the triglycerides are above goal. We have discussed that they are likely to improve with decreased intake of simple sugars and improved diabetic control.     Hypertension; B/P above goal today; instructed to monitor home B/P; reviewed goal for B/P; and rationale for checking this at home with  recent medication change.     Vitamin D deficiency; on OTC supplement; will recheck level.          Plan:   Below is a summary of information and instructions we reviewed at today's appointment. Please review this information carefully and call if you have any questions regarding  it.     Diabetes information/instructions:   Labwork: You are due for lab work  on or after 06/24/2020 .   You should fast for 10 hours prior to having this labwork done.    Self-monitoring of blood glucose: We recommend that you monitor your blood glucose before breakfast, lunch, dinner and bedtime, and as needed for signs and/or symptoms of hypoglycemia. Please fax, email, drop off or MyChartMessage your blood sugar readings for further adjustment of regimen  if at anytime you notice too many high or low blood sugar readings  Sharing your CGM data between visits:  We can make more progress helping you improve blood sugar control if we review your CGM data between visits. Please let us know when you have uploaded your device so we can review your data each time.   To share data from a DexCom CGM: please provide the office with the email account associated with your Dexcom.  We will send you an email invitation to share your data.  Please then log into your Dexcom account to accept the invitation, consent to share data with the clinic, and tap "Yes, Share My Data."   Important general information about using a continuous glucose sensor (CGM): Do not inject insulin within 1-3 inches of the sensor. Remove sensor if you are having MRI, CT, X-ray. Rotate sensor sites. If you stop wearing your CGM for a period of time, please resume fingerstick blood sugar checks at least 4 times a day.   Please contact Dexcom for help with troubleshooting.     Please scan your continuous glucose monitor sensor before meals, and one hour after meals.  The goal for your blood glucose before meals is 90 - 130.  The goal for your blood glucose one hour after meals is 100 - 180.     Diabetic Medication/Insulin Instructions:  Increase your Basaglar insulin dose to 64 units once a day     For your Novolog, continue 4 units before breakfast, and 6 units before lunch and 10 units before supper, and add correction scale:    If your before meal  blood glucose is:  150 - 200, add 2 units Novolog  201- 250, add 4 units Novolog  251-300,  add 6 units Novolog  301 - 350, add 8 units Novolog  351 - 400, add 10 units Novolog and notify the office  .    Low Blood Sugar Prevention and Treatment:   Please carry fast-acting carbohydrate source (preferably glucose tablets) with you at all times. Please treat low blood sugar with 15 grams carbohydrate (4 glucose tablets), wait 15 minutes, and recheck sugar. Re-treat if necessary.   Please notify office for any episode of severe hypoglycemia, such as those requiring assistance from someone else.     Aspirin: Please continue taking low-dose aspirin.    Diet:   Please decrease your intake of simple sugars and starches. Please watch your portions.  Please try to avoid missing meals.    Activity:   Follow your podiatrist and surgeon's instructions.  you can do arm exercises for 10 minutes a day.     Foot Care:   Please examine feet  daily and appropriately treat callouses or lesions. Please avoid being barefoot or wearing of ill fitting shoes. Please use lotion as needed to treat dry skin on feet.  Please follow up with your podiatrist regarding recent foot surgery, routine foot care    Diabetic Education:   We did not discuss diabetic education today. If you would like to follow up with a diabetic educator, please call the office and we will arrange for this    Screening for Health Complications of Diabetes:   Diabetic screening is up-to-date except for a dental exam. Please schedule an appointment with your dentist.  Free dental cleanings are available through the [facility] Dental hygeine program. [phone] You may wish to consider sedation dentistry. One provider is Dr. Burnadette Peter at [facility], [phone].    The following additional instructions relate to health conditions other than your diabetes:   Cholesterol/lipids: Your cholesterol is in good range but your triclyerides are elevated and your HDL  ("good" cholesterol") is low. We have reviewed that decreased intake of "simple" sugars (including fruit, fruit juice, other sweet drinks and sugary foods) can help to improve your triglycerides. Increased exercise can help raise your HDL.     Blood Pressure: Please monitor your blood pressure regularly. Your blood pressure goal is 130/80.  Please contact your PCP if your blood pressure remains above goal.   Please follow up with your primary care provider regarding planning surgery on your right foot, and hospital discharge follow up   Continue to follow with your kidney doctor, your podiatrist and your surgeon at [facility].       No follow-ups on file.      Maricela Bo, NP

## 2020-06-16 ENCOUNTER — Ambulatory Visit (RURAL_HEALTH_CENTER): Payer: BC Managed Care – PPO | Admitting: Family

## 2020-06-16 ENCOUNTER — Telehealth (RURAL_HEALTH_CENTER): Payer: Self-pay

## 2020-06-16 NOTE — Telephone Encounter (Signed)
Called patient to get past 2 weeks of BS readings for today's scheduled telehealth visit, patient states he has Dexcom and can give me an average and that's about it, states he doesn't wanna go thru 2 weeks of readings states he is unsure how to scroll thru the history on Dexcom reader. Patient advised data needed for review, patient asked how he could upload and I sent share invitation to his email address and reviewed upload instructions with patient.  Patient was not able to share data, today's visit rescheduled to June 14th, states his wife will be able to bring him in for that visit; patient denies low BS since his foot surgery and states BS average has been around 180.

## 2020-07-07 ENCOUNTER — Other Ambulatory Visit (RURAL_HEALTH_CENTER): Payer: Self-pay | Admitting: Family

## 2020-07-07 DIAGNOSIS — E1165 Type 2 diabetes mellitus with hyperglycemia: Secondary | ICD-10-CM

## 2020-07-07 DIAGNOSIS — Z794 Long term (current) use of insulin: Secondary | ICD-10-CM

## 2020-07-21 ENCOUNTER — Ambulatory Visit
Admission: RE | Admit: 2020-07-21 | Discharge: 2020-07-21 | Disposition: A | Discharge status: Home / Self Care | Payer: BC Managed Care – PPO | Source: Ambulatory Visit | Attending: Internal Medicine | Admitting: Internal Medicine

## 2020-07-21 DIAGNOSIS — N1831 Chronic kidney disease, stage 3a: Secondary | ICD-10-CM | POA: Insufficient documentation

## 2020-07-21 LAB — RENAL FUNCTION PANEL
Albumin: 2.5 gm/dL — ABNORMAL LOW (ref 3.5–5.0)
Anion Gap: 57.1 mMol/L — ABNORMAL HIGH (ref 7.0–18.0)
BUN / Creatinine Ratio: 13.9 Ratio (ref 10.0–30.0)
BUN: 34 mg/dL — ABNORMAL HIGH (ref 7–22)
CO2: 24.5 mMol/L (ref 20.0–30.0)
Calcium: 8.8 mg/dL (ref 8.5–10.5)
Chloride: 66 mMol/L — ABNORMAL LOW (ref 98–110)
Creatinine: 2.45 mg/dL — ABNORMAL HIGH (ref 0.80–1.30)
EGFR: 30 mL/min/{1.73_m2} — ABNORMAL LOW (ref 60–150)
Glucose: 208 mg/dL — ABNORMAL HIGH (ref 71–99)
Osmolality Calculated: 299 mOsm/kg (ref 275–300)
Phosphorus: 4.2 mg/dL (ref 2.3–4.7)
Potassium: 4.6 mMol/L (ref 3.5–5.3)
Sodium: 143 mMol/L (ref 136–147)

## 2020-07-21 LAB — HEMOGLOBIN AND HEMATOCRIT, BLOOD
Hematocrit: 38.8 % — ABNORMAL LOW (ref 39.0–52.5)
Hemoglobin: 12 gm/dL — ABNORMAL LOW (ref 13.0–17.5)

## 2020-07-21 LAB — PTH, INTACT: PTH Intact: 18.6 pg/mL — ABNORMAL LOW (ref 20.0–83.0)

## 2020-07-22 ENCOUNTER — Ambulatory Visit
Admission: RE | Admit: 2020-07-22 | Discharge: 2020-07-22 | Disposition: A | Discharge status: Home / Self Care | Payer: BC Managed Care – PPO | Source: Ambulatory Visit | Attending: Internal Medicine | Admitting: Internal Medicine

## 2020-07-22 DIAGNOSIS — N1831 Chronic kidney disease, stage 3a: Secondary | ICD-10-CM | POA: Insufficient documentation

## 2020-07-22 LAB — VITAMIN D,25 OH,TOTAL: Vitamin D 25-Hydroxy: 14.5 ng/mL — ABNORMAL LOW (ref 30.0–80.0)

## 2020-07-23 LAB — PROTEIN / CREATININE RATIO, URINE
Protein/Creatinine Ratio: 11.98 Ratio — ABNORMAL HIGH (ref 0.00–0.09)
Urine Creatinine Random: 42 mg/dL
Urine Protein: 503.1 mg/dL — ABNORMAL HIGH (ref 0.0–14.0)

## 2020-07-27 NOTE — Progress Notes (Signed)
Endocrine Provider Progress Note      Patient Name:  [name] [[id number]] DOB: 01-12-1966  Date: 07/28/2020    Subjective:          LARONE MCGONAGLE is a 55 y.o. male who presents for follow up of type 2 diabetes mellitus.   Since the last visit, the patient had surgery for Charcot's joint of right foot at [facility] by Dr. Bradly Chris, on 05/28/2020.  He has had post op appointments; healing well; currently wearing surgical boot to RLE and is non-weight bearing.   He denies signs of hyperglycemia, but has noted BG trending up since his less active.  Is following with Nephrology (Dr. Vance Peper) had lab work done 07/21/2020 and appointment 08/11/2020. Reports that office contacted him with regarding his lab results, and he is supposed to have f/u lab work on 08/03/2020.   He denies use of NSAID's.  He denies chest pain, palpitations, abdominal pain, nausea, vomiting or diarrhea.        Monitoring, Compliance and Complications:  Diagnosis Date: ~2000-2001  Prior Diabetic Treatment: Marcelline Deist (discontinued due to renal function) patient reports Marcelline Deist was stopped during hospitalization for osteomyelitis.   Hgb A1c at time of referral: 11.1% (done 12/20/16).  HGB A1C goal based on age and comorbidities is 7%.  The patient has the following diabetic complications:  retinopathy, nephropathy and neuropathy, history of foot ulcer.  He has had multiple surgeries for osteomyelitis on his left foot in 2017.     Glucose Meter: Reli-On, not sure which model  Glucose Meter Validation Date: never  the patient uses finger stick BG monitoring when CGM is not in use   .  Marland Kitchen  CGM data from July 15, 2020 through July 28, 2020 were reviewed today as follows:  CGM Type:  Dexcom G6  Percent of time in target range: 49  For additional CGM data, see scanned CGM download attached to this visit note.   Average sensor glucose: 182  Percent of time above target range: 44% high; 6% very high  Percent of time below target range: <1%   Tracings show BG from 12  midnight to 6am trending up above goal.   BG spikes around 9:00am consistently.  Some BG elevated at 9pm.        Injections:   Injections are given by patient.  Pre-meal Novolog 4 units ACB; 6 units ACL; 10 units ACS plus adds correction scale 2 units every 50 points BG >150  Injections sites include: abdominal wall  Injection compliance: The patient never misses injections.  No problems noted with injection sites.      Diet, Exercise, and Weight:     The patient tries to watch portions, starches and sweets.  Compliance with diet has been variable.   Exercise: The patient is unable to exercise due to NWB on right foot.   Weight: Weight has decreased 1 lbs since last visit.  (reported weight)    Hypoglycemia:   The patient has experienced mild occasional hypoglycemia since the last visit. Symptoms of hypoglycemia include: hunger and shaking, blurred vision, nausea. . The patient treats hypoglycemia with carb snack, glucose tablets    The patient has Nasal Glucagon (Baqsimi). This is up-to-date and family members have been instructed in its use.   The patient does not have a medical alert bracelet or necklace.    Diabetic Education: The patient has received diabetes education, most recently 05/14/2020.  Next appointment 08/13/2020  Diabetic Complication Screening/Treatment  Eye exam: Dr. Caffie Damme. Last visit was 03/17/2020.  No evidence of diabetic retinopathy on last eye exam.  Urine microalbumin/creatinine ratio: N/A. The patient has CKD followed by nephrologist.    Foot Care: The patient reports continued problems with  neuropathic pain  Has followed with Podiatry; Dr. Rudell Cobb   Monofilament: 03/30/2020, not intact   Aspirin Therapy: The patient is currently taking aspirin at dose indicated in the medication list.   Cardiovascular risk factors: advanced age (older than 51 for men, 33 for women), diabetes mellitus, dyslipidemia, hypertension, male gender, obesity (BMI >= 30 kg/m2), sedentary lifestyle and  smoking/ tobacco exposure  Ace Inhibitor/ARB: ACE-I/ARB is contraindicated due to  CKD.  Dental care: Patient has received regular dental care, most recently Feb-March 2021 Had 4 teeth extracted    Current Outpatient Medications   Medication Sig Dispense Refill   . amLODIPine (NORVASC) 5 MG tablet TAKE 1 TABLET (5 MG TOTAL) BY MOUTH DAILY. 30 tablet 11   . aspirin EC 81 MG EC tablet Take 1 tablet (81 mg total) by mouth every evening. 90 tablet 3   . atorvastatin (LIPITOR) 40 MG tablet TAKE 1 TABLET BY MOUTH ONCE DAILY 90 tablet 2   . Blood Glucose Monitoring Suppl (RELION PRIME MONITOR) Device by Does not apply route USE TO TEST BLOOD GLUCOSE FOUR TIMES DAILY     . Continuous Blood Gluc Receiver (Dexcom G6 Receiver) Device 1 Device by Does not apply route daily 1 Device 0   . Continuous Blood Gluc Sensor (Dexcom G6 Sensor) Misc 1 each by Does not apply route every 10 (ten) days ICD 10 code:  E11.65 9 each 3   . Continuous Blood Gluc Transmit (Dexcom G6 Transmitter) Misc For use with continuous glucose monitoring.  ICD 10 code:  E11.65 1 each 1   . gabapentin (NEURONTIN) 300 MG capsule Take 1 capsule (300 mg total) by mouth 3 (three) times daily 90 capsule 5   . Glucagon (Baqsimi Two Pack) 3 MG/DOSE Powder 1 each by Nasal route as needed (severe HYPOglycemia) 1 each 1   . glucose blood (RELION PRIME TEST) test strip 1 each by Other route 4 (four) times daily Use as instructed     . insulin glargine (BASAGLAR KWIKPEN) 100 UNIT/ML injection pen Inject 64 Units into the skin nightly 60 mL 1   . Insulin Pen Needle 31G X 6 MM Misc by Does not apply route.Use up to 7 times a day as directed     . liraglutide (Victoza) 18 MG/3ML injection INJECT 1.8MG  SUBCUTANEOUSLY ONCE DAILY 9 mL 5   . Multiple Vitamin (multivitamin) capsule Take 1 capsule by mouth daily     . NovoLOG FlexPen 100 UNIT/ML injection pen INJECT SUBCUTANEOUSLY three times a day PRIOR TO MEALS PER CORRECTION SCALE UP TO 40 UNITS DAILY 15 mL 3   . ondansetron  (ZOFRAN) 8 MG tablet Take 1 tablet (8 mg total) by mouth every 6 (six) hours as needed for Nausea 10 tablet 0   . Vitamin D3 (CHOLECALCIFEROL) 50 MCG (2000 UT) tablet Take 1 tablet (50 mcg total) by mouth daily       No current facility-administered medications for this visit.      Medication review with Hulda Humphrey suggested compliance all of the time.       Immunization History   Administered Date(s) Administered   . Td 06/14/2009   . Td,preservative free 06/14/2009   . Tdap 07/06/2011  The following portions of the patient's history were reviewed and updated as appropriate: current medications, past medical history and problem list.  The following information was also reviewed at today's visit: lab data and continuous glucose sensor download data    Review of Systems  As above.      Objective:      Vital Signs: BP 148/84   Pulse 91   Ht 1.676 m (5\' 6" ) Comment: Per Pt  Wt 113.4 kg (250 lb) Comment: Per Pt  BMI 40.35 kg/m      PE  Well nourished, well appearing, in no acute distress.  HEENT:  There is a slight stare.  There is no lid lag. There is no periorbital edema. Extraoccular movements are intact. No conjunctival injection.  oral mucosa not examined. Patient wearing mask.  Neck is supple without adenopathy.  The thyroid is normal in size and consistency and no nodules are appreciated. The thyroid is normal on inspection.  Lungs are clear to auscultation.  Cardiac exam reveals a regular rate and rhythm. No murmur or gallop is appreciated.    Foot exam of right foot deferred due to RLE surgical boot in place.  Exam of the left extremity reveals +1 ankle edema.     Posterior tibial and doralis pedis pulses are normal.on left foot  Callouses are noted left heel. Dry skin  is noted on left plantar/dorsal surface Onychomycosis is noted on nails of left foot  On neurologic exam, the patient is alert and appropriate. Gait and speech are normal.   Skin is warm and dry.     Lab Review    Lab Results    Component Value Date    HGBA1CPERCNT 7.1 03/27/2020    HGBA1CPERCNT 7.3 10/19/2019    HGBA1CPERCNT 7.9 06/06/2019    TSH 1.33 10/19/2019    CHOL 186 03/27/2020    HDL 33 (L) 03/27/2020    LDL 80 03/27/2020    TRIG 161 (H) 03/27/2020    ALT 31 03/27/2020    B12 419 09/13/2017    VITD 14.5 (L) 07/21/2020    GLU 208 (H) 07/21/2020    K 4.6 07/21/2020    CA 8.8 07/21/2020    CREAT 2.45 (H) 07/21/2020    CO2 24.5 07/21/2020    EGFR 30 (L) 07/21/2020    NA 143 07/21/2020    ALKPHOS 128 03/06/2020       03/09/19 vitamin D2/D3 total 21 (normal 20-50)    Assessment:      1. Type 2 diabetes mellitus with hyperglycemia, with long-term current use of insulin  insulin glargine (BASAGLAR KWIKPEN) 100 UNIT/ML injection pen    NovoLOG FlexPen 100 UNIT/ML injection pen   2. Essential hypertension     3. Mixed hyperlipidemia     4. Vitamin D deficiency       Diabetes: Diabetes is suboptimally controlled. Discussed specific areas of diabetes management as detailed under patient instructions below.    Reviewed CGM.   Reviewed basal/bolus insulin therapy; Insulin adjustments as instructed below to target improvement in BG.  Reinforced rationale for BG control.   Encouraged arm exercises for activity.  Plan to review CGM data again in 4 weeks    Lipids: We have reviewed that the triglycerides are above goal. We have discussed that they are likely to improve with decreased intake of simple sugars and improved diabetic control.     eGFR: 30; follows with nephrology  Hypertension; B/P above goal today; reviewed B/P goal and instructed  to monitor home B/P and take readings to upcoming appointment with Nephrology.      Vitamin D deficiency; followed by nephrology; currently taking OTC Vitamin D supplement          Plan:   Below is a summary of information and instructions we reviewed at today's appointment. Please review this information carefully and call if you have any questions regarding it.     Diabetes information/instructions:    Labwork: You are due for lab work  now.  You should fast for 10 hours prior to having this labwork done.    Self-monitoring of blood glucose: We recommend that you monitor your blood glucose before breakfast, lunch, dinner and bedtime, and as needed for signs and/or symptoms of hypoglycemia. Please fax, email, drop off or MyChartMessage your blood sugar readings for further adjustment of regimen  if at anytime you notice too many high or low blood sugar readings  Sharing your CGM data between visits:  We can make more progress helping you improve blood sugar control if we review your CGM data between visits. Please let us know when you have uploaded your device so we can review your data each time.   To share data from a DexCom CGM: please provide the office with the email account associated with your Dexcom.  We will send you an email invitation to share your data.  Please then log into your Dexcom account to accept the invitation, consent to share data with the clinic, and tap "Yes, Share My Data."   Important general information about using a continuous glucose sensor (CGM): Do not inject insulin within 1-3 inches of the sensor. Remove sensor if you are having MRI, CT, X-ray. Rotate sensor sites. If you stop wearing your CGM for a period of time, please resume fingerstick blood sugar checks at least 4 times a day.   Please contact Dexcom for help with troubleshooting.     Please scan your continuous glucose monitor sensor before meals, and one hour after meals.  The goal for your blood glucose before meals is 90 - 130.  The goal for your blood glucose one hour after meals is 100 - 180.     Diabetic Medication/Insulin Instructions:  Increase your Basaglar insulin dose to 66 units once a day     Increase your Novolog before breakfast  6 units, and continue  6 units before lunch and 10 units before supper, and add correction scale:    If your before meal blood glucose is:  150 - 200, add 2 units Novolog  201- 250,  add 4 units Novolog  251-300,  add 6 units Novolog  301 - 350, add 8 units Novolog  351 - 400, add 10 units Novolog and notify the office    We will plan to review your continuous data again in four weeks.  .  Low Blood Sugar Prevention and Treatment:   Please carry fast-acting carbohydrate source (preferably glucose tablets) with you at all times. Please treat low blood sugar with 15 grams carbohydrate (4 glucose tablets), wait 15 minutes, and recheck sugar. Re-treat if necessary.   Please notify office for any episode of severe hypoglycemia, such as those requiring assistance from someone else.   .  Diet:   Please aim for a consistent carbohydrate intake at each meal. Please try to avoid missing meals.  Stay hydrated with water.    Activity:   Try arm exercises for 5 - 10 minutes each day  Follow your  podiatrist/surgeon's instructions.      Foot Care:   Please examine feet daily and appropriately treat callouses or lesions. Please avoid being barefoot or wearing of ill fitting shoes. Please use lotion as needed to treat dry skin on feet.  Please follow up with your podiatrist regarding recent foot surgery, routine foot care    Diabetic Education:   I recommended that you meet with the diabetic educator to review information to help you with management of your diabetes.  At this point you have indicated that you do not wish to meet with the educator.  If you change your mind prior to the next appointment, please call the office and we will arrange for this.    Screening for Health Complications of Diabetes:   Diabetic screening is up-to-date except for a dental exam. Please schedule an appointment with your dentist.  Free dental cleanings are available through the [facility] Dental hygeine program. [phone] You may wish to consider sedation dentistry. One provider is Dr. Virgina Evener at [facility], [phone].    The following additional instructions relate to health conditions other than your diabetes:    Cholesterol/lipids: Your cholesterol is in good range but your triclyerides are elevated and your HDL ("good" cholesterol") is low. We have reviewed that decreased intake of "simple" sugars (including fruit, fruit juice, other sweet drinks and sugary foods) can help to improve your triglycerides. Increased exercise can help raise your HDL.     Blood Pressure: Your blood pressure is above goal today.  Please monitor your blood pressure regularly. Your blood pressure goal is 130/80.  Take your blood pressure readings to the appointment with your kidney doctor on 08/11/2020  Please follow up with your primary care provider regarding routine health screenings   Continue to follow with your primary care provider, kidney doctor, your podiatrist/surgeon at [facility].       Return in about 2 months (around 09/27/2020).      Merita Norton, NP     From 3:10pm to 3:21pm, and from 3:23pm to 3:50pm, and from 9:14pm to 9:24pm, 48 minutes was the total time spent on behalf of this patient on this date, including time spent preparing to see the patient, obtaining and/or reviewing separately obtained history, performing a medically appropriate physical examination or evaluation, counseling and educating patient/family/caregiver, documenting clinical information in the electronic medical record and Independently interpreting results and communicating results to patient/family/caregiver.

## 2020-07-28 ENCOUNTER — Ambulatory Visit: Payer: BC Managed Care – PPO | Attending: Family | Admitting: Family

## 2020-07-28 VITALS — BP 148/84 | HR 91 | Ht 66.0 in | Wt 250.0 lb

## 2020-07-28 DIAGNOSIS — E559 Vitamin D deficiency, unspecified: Secondary | ICD-10-CM

## 2020-07-28 DIAGNOSIS — E782 Mixed hyperlipidemia: Secondary | ICD-10-CM

## 2020-07-28 DIAGNOSIS — I10 Essential (primary) hypertension: Secondary | ICD-10-CM

## 2020-07-28 DIAGNOSIS — E1165 Type 2 diabetes mellitus with hyperglycemia: Secondary | ICD-10-CM

## 2020-07-28 DIAGNOSIS — Z794 Long term (current) use of insulin: Secondary | ICD-10-CM

## 2020-07-28 MED ORDER — BASAGLAR KWIKPEN 100 UNIT/ML SC SOPN
66.0000 [IU] | PEN_INJECTOR | Freq: Every evening | SUBCUTANEOUS | 0 refills | Status: DC
Start: 2020-07-28 — End: 2020-08-04

## 2020-07-28 MED ORDER — NOVOLOG FLEXPEN 100 UNIT/ML SC SOPN
PEN_INJECTOR | SUBCUTANEOUS | 3 refills | Status: DC
Start: 2020-07-28 — End: 2021-04-12

## 2020-07-28 NOTE — Patient Instructions (Signed)
Below is a summary of information and instructions we reviewed at today's appointment. Please review this information carefully and call if you have any questions regarding it.     Diabetes information/instructions:   Labwork: You are due for lab work  now.  You should fast for 10 hours prior to having this labwork done.    Self-monitoring of blood glucose: We recommend that you monitor your blood glucose before breakfast, lunch, dinner and bedtime, and as needed for signs and/or symptoms of hypoglycemia. Please fax, email, drop off or MyChartMessage your blood sugar readings for further adjustment of regimen  if at anytime you notice too many high or low blood sugar readings  Sharing your CGM data between visits:  We can make more progress helping you improve blood sugar control if we review your CGM data between visits. Please let us know when you have uploaded your device so we can review your data each time.   To share data from a DexCom CGM: please provide the office with the email account associated with your Dexcom.  We will send you an email invitation to share your data.  Please then log into your Dexcom account to accept the invitation, consent to share data with the clinic, and tap "Yes, Share My Data."   Important general information about using a continuous glucose sensor (CGM): Do not inject insulin within 1-3 inches of the sensor. Remove sensor if you are having MRI, CT, X-ray. Rotate sensor sites. If you stop wearing your CGM for a period of time, please resume fingerstick blood sugar checks at least 4 times a day.   Please contact Dexcom for help with troubleshooting.     Please scan your continuous glucose monitor sensor before meals, and one hour after meals.  The goal for your blood glucose before meals is 90 - 130.  The goal for your blood glucose one hour after meals is 100 - 180.     Diabetic Medication/Insulin Instructions:  Increase your Basaglar insulin dose to 66 units once a day      Increase your Novolog before breakfast  6 units, and continue  6 units before lunch and 10 units before supper, and add correction scale:    If your before meal blood glucose is:  150 - 200, add 2 units Novolog  201- 250, add 4 units Novolog  251-300,  add 6 units Novolog  301 - 350, add 8 units Novolog  351 - 400, add 10 units Novolog and notify the office    We will plan to review your continuous data again in four weeks.  .  Low Blood Sugar Prevention and Treatment:   Please carry fast-acting carbohydrate source (preferably glucose tablets) with you at all times. Please treat low blood sugar with 15 grams carbohydrate (4 glucose tablets), wait 15 minutes, and recheck sugar.  Re-treat if necessary.   Please notify office for any episode of severe hypoglycemia, such as those requiring assistance from someone else.   .  Diet:   Please aim for a consistent carbohydrate intake at each meal. Please try to avoid missing meals.  Stay hydrated with water.    Activity:   Try arm exercises for 5 - 10 minutes each day  Follow your podiatrist/surgeon's instructions.      Foot Care:   Please examine feet daily and appropriately treat callouses or lesions. Please avoid being barefoot or wearing of ill fitting shoes. Please use lotion as needed to treat dry skin on  feet.  Please follow up with your podiatrist regarding recent foot surgery, routine foot care    Diabetic Education:   I recommended that you meet with the diabetic educator to review information to help you with management of your diabetes.  At this point you have indicated that you do not wish to meet with the educator.  If you change your mind prior to the next appointment, please call the office and we will arrange for this.    Screening for Health Complications of Diabetes:   Diabetic screening is up-to-date except for a dental exam. Please schedule an appointment with your dentist.  Free dental cleanings are available through the [facility] Dental hygeine program.  [phone] You may wish to consider sedation dentistry. One provider is Dr. Burnadette Peter at [facility], [phone].    The following additional instructions relate to health conditions other than your diabetes:   Cholesterol/lipids: Your cholesterol is in good range but your triclyerides are elevated and your HDL ("good" cholesterol") is low. We have reviewed that decreased intake of "simple" sugars (including fruit, fruit juice, other sweet drinks and sugary foods) can help to improve your triglycerides. Increased exercise can help raise your HDL.     Blood Pressure: Your blood pressure is above goal today.  Please monitor your blood pressure regularly. Your blood pressure goal is 130/80.  Take your blood pressure readings to the appointment with your kidney doctor on 08/11/2020  Please follow up with your primary care provider regarding routine health screenings   Continue to follow with your primary care provider, kidney doctor, your podiatrist/surgeon at [facility].       Return in about 2 months (around 09/27/2020).      Maricela Bo, NP

## 2020-08-04 ENCOUNTER — Other Ambulatory Visit (RURAL_HEALTH_CENTER): Payer: Self-pay | Admitting: Family

## 2020-08-04 DIAGNOSIS — E1165 Type 2 diabetes mellitus with hyperglycemia: Secondary | ICD-10-CM

## 2020-08-04 DIAGNOSIS — Z794 Long term (current) use of insulin: Secondary | ICD-10-CM

## 2020-08-06 ENCOUNTER — Ambulatory Visit
Admission: RE | Admit: 2020-08-06 | Discharge: 2020-08-06 | Disposition: A | Discharge status: Home / Self Care | Payer: BC Managed Care – PPO | Source: Ambulatory Visit | Attending: Family | Admitting: Family

## 2020-08-06 ENCOUNTER — Ambulatory Visit
Admission: RE | Admit: 2020-08-06 | Discharge: 2020-08-06 | Disposition: A | Discharge status: Home / Self Care | Payer: BC Managed Care – PPO | Source: Ambulatory Visit | Attending: Internal Medicine | Admitting: Internal Medicine

## 2020-08-06 DIAGNOSIS — N1831 Chronic kidney disease, stage 3a: Secondary | ICD-10-CM | POA: Insufficient documentation

## 2020-08-06 DIAGNOSIS — Z794 Long term (current) use of insulin: Secondary | ICD-10-CM | POA: Insufficient documentation

## 2020-08-06 DIAGNOSIS — E1165 Type 2 diabetes mellitus with hyperglycemia: Secondary | ICD-10-CM

## 2020-08-06 LAB — ALT: ALT: 30 U/L (ref 0–55)

## 2020-08-06 LAB — RENAL FUNCTION PANEL
Albumin: 2.5 gm/dL — ABNORMAL LOW (ref 3.5–5.0)
Anion Gap: 14.5 mMol/L (ref 7.0–18.0)
BUN / Creatinine Ratio: 13 Ratio (ref 10.0–30.0)
BUN: 31 mg/dL — ABNORMAL HIGH (ref 7–22)
CO2: 24.8 mMol/L (ref 20.0–30.0)
Calcium: 9.7 mg/dL (ref 8.5–10.5)
Chloride: 108 mMol/L (ref 98–110)
Creatinine: 2.39 mg/dL — ABNORMAL HIGH (ref 0.80–1.30)
EGFR: 31 mL/min/{1.73_m2} — ABNORMAL LOW (ref 60–150)
Glucose: 197 mg/dL — ABNORMAL HIGH (ref 71–99)
Osmolality Calculated: 297 mOsm/kg (ref 275–300)
Phosphorus: 4 mg/dL (ref 2.3–4.7)
Potassium: 4.3 mMol/L (ref 3.5–5.3)
Sodium: 143 mMol/L (ref 136–147)

## 2020-08-06 LAB — LIPID PANEL
Cholesterol: 192 mg/dL (ref 75–199)
Coronary Heart Disease Risk: 6
HDL: 32 mg/dL — ABNORMAL LOW (ref 40–55)
LDL Calculated: UNDETERMINED mg/dL
Triglycerides: 622 mg/dL — ABNORMAL HIGH (ref 10–150)
VLDL: UNDETERMINED (ref 0–40)

## 2020-08-06 LAB — HEMOGLOBIN A1C: Hgb A1C, %: 8.4 %

## 2020-08-07 ENCOUNTER — Ambulatory Visit
Admission: RE | Admit: 2020-08-07 | Discharge: 2020-08-07 | Disposition: A | Discharge status: Home / Self Care | Payer: BC Managed Care – PPO | Source: Ambulatory Visit | Attending: Family | Admitting: Family

## 2020-08-07 DIAGNOSIS — E1165 Type 2 diabetes mellitus with hyperglycemia: Secondary | ICD-10-CM | POA: Insufficient documentation

## 2020-08-07 DIAGNOSIS — Z794 Long term (current) use of insulin: Secondary | ICD-10-CM | POA: Insufficient documentation

## 2020-08-07 LAB — HEMOGLOBIN A1C: Hgb A1C, %: 7.6 %

## 2020-08-09 NOTE — Progress Notes (Signed)
Note reviewed. Agree with assessment and plan as outlined by Ms. Wenzel, FNP-BC.  Jamahl Lemmons A. Nykira Reddix, MD, FACP, FACE

## 2020-08-11 ENCOUNTER — Telehealth: Payer: Self-pay

## 2020-08-11 NOTE — Telephone Encounter (Signed)
Left MessageCommunicated - In reference to appointment for DMP on 6/30 at 10 am.

## 2020-08-13 ENCOUNTER — Ambulatory Visit: Payer: Self-pay

## 2020-08-18 ENCOUNTER — Telehealth (RURAL_HEALTH_CENTER): Payer: Self-pay | Admitting: Family

## 2020-08-18 DIAGNOSIS — E782 Mixed hyperlipidemia: Secondary | ICD-10-CM

## 2020-08-18 DIAGNOSIS — E1165 Type 2 diabetes mellitus with hyperglycemia: Secondary | ICD-10-CM

## 2020-08-18 DIAGNOSIS — Z794 Long term (current) use of insulin: Secondary | ICD-10-CM

## 2020-08-18 MED ORDER — BASAGLAR KWIKPEN 100 UNIT/ML SC SOPN
68.0000 [IU] | PEN_INJECTOR | Freq: Every day | SUBCUTANEOUS | 1 refills | Status: DC
Start: 2020-08-18 — End: 2020-09-16

## 2020-08-18 NOTE — Telephone Encounter (Signed)
Phone contact with patient to review lab results.  Reports he had A1c done on 08/06/2020, and the lab called him and said they needed to re-collect the A1c, and he returned to lab on 08/07/2020 for this, A1c result: 7.6.  reviewed goal for A1c.  He reports fasting BG have been ranging in the 170's; currently taking Basaglar insulin 66 units once a day.  Instructed to increase Basaglar insulin to 68 units once a day. He verbalizes understanding.  Plan to review BG data on 09/08/2020 with CGM upload.   Reviewed his Lipid panel results; triglycerides 622; HDL: 32; cholesterol: 192.   Reviewed rationale for improving BG and avoiding intake of simple sugars.   Will avoid Tricor, due to his eGFR of 31.   Will recheck lipid panel in three months. Reviewed signs of pancreatitis to be alert for and seek immediate medical attention.   He confirms he is taking Lipitor 40 mg daily, and has adequate supply.  He had appointment with Nephrologist August 11, 2020; denies medication changes.  Reviewed BG goals and BG parameters to contact office. Reviewed follow up fasting lab work due in three months, prior to next appointment, and I have ordered this.  He verbalizes understanding and appreciates the call.

## 2020-08-25 ENCOUNTER — Encounter (RURAL_HEALTH_CENTER): Payer: Self-pay

## 2020-08-28 ENCOUNTER — Ambulatory Visit
Admission: RE | Admit: 2020-08-28 | Discharge: 2020-08-28 | Disposition: A | Discharge status: Home / Self Care | Payer: BC Managed Care – PPO | Source: Ambulatory Visit | Attending: Internal Medicine | Admitting: Internal Medicine

## 2020-08-28 DIAGNOSIS — N1831 Chronic kidney disease, stage 3a: Secondary | ICD-10-CM | POA: Insufficient documentation

## 2020-08-28 LAB — RENAL FUNCTION PANEL
Albumin: 2.3 gm/dL — ABNORMAL LOW (ref 3.5–5.0)
Anion Gap: 16.6 mMol/L (ref 7.0–18.0)
BUN / Creatinine Ratio: 16.7 Ratio (ref 10.0–30.0)
BUN: 49 mg/dL — ABNORMAL HIGH (ref 7–22)
CO2: 19.6 mMol/L — ABNORMAL LOW (ref 20.0–30.0)
Calcium: 8.6 mg/dL (ref 8.5–10.5)
Chloride: 106 mMol/L (ref 98–110)
Creatinine: 2.94 mg/dL — ABNORMAL HIGH (ref 0.80–1.30)
EGFR: 24 mL/min/{1.73_m2} — ABNORMAL LOW (ref 60–150)
Glucose: 125 mg/dL — ABNORMAL HIGH (ref 71–99)
Osmolality Calculated: 290 mOsm/kg (ref 275–300)
Phosphorus: 3.7 mg/dL (ref 2.3–4.7)
Potassium: 4.2 mMol/L (ref 3.5–5.3)
Sodium: 138 mMol/L (ref 136–147)

## 2020-09-06 ENCOUNTER — Emergency Department: Payer: Self-pay

## 2020-09-06 ENCOUNTER — Emergency Department
Admission: EM | Admit: 2020-09-06 | Discharge: 2020-09-06 | Disposition: A | Discharge status: Home / Self Care | Payer: Self-pay | Attending: Sports Medicine" | Admitting: Sports Medicine"

## 2020-09-06 DIAGNOSIS — S86812A Strain of other muscle(s) and tendon(s) at lower leg level, left leg, initial encounter: Secondary | ICD-10-CM | POA: Insufficient documentation

## 2020-09-06 DIAGNOSIS — W1839XA Other fall on same level, initial encounter: Secondary | ICD-10-CM | POA: Insufficient documentation

## 2020-09-06 DIAGNOSIS — S82032A Displaced transverse fracture of left patella, initial encounter for closed fracture: Secondary | ICD-10-CM | POA: Insufficient documentation

## 2020-09-06 MED ORDER — HYDROCODONE-ACETAMINOPHEN 5-325 MG PO TABS
1.0000 | ORAL_TABLET | Freq: Four times a day (QID) | ORAL | 0 refills | Status: DC | PRN
Start: 2020-09-06 — End: 2020-12-08

## 2020-09-06 NOTE — ED Triage Notes (Signed)
Left knee abrasion and swelling.  Denies other injury.  Slipped.  Has boot on other foot from surgery for Charcot's dz

## 2020-09-06 NOTE — ED Provider Notes (Signed)
[facility] EMERGENCY DEPARTMENT History and Physical Exam      Patient Name: [name], [name]  Encounter Date:  09/06/2020  Attending Physician: Gwen Pounds, MD  PCP: Tanja Port, MD  Patient DOB:  [birth year]  MRN:  [id number]  Room:  E6/ED6-A      History of Presenting Illness     Chief complaint: Fall    HPI/ROS is limited by: none  HPI/ROS given by: patient    CONTEXT: [name] is a 55 y.o. male who presents with History of falling And injuring his left knee. The patient has a Charcot foot on the right and wears a [employer]. He did not strike his head. There was no loss of consciousness.   LOCATION:  This localized to the anterior left knee. He is not able to extend his left lower leg.  SEVERITY: The symptoms are described as moderate to severe.   DURATION:  The incident occurred this morning not long before coming.   QUALITY:  Pain is sharp and throbbing.   ASSOCIATED SIGNS/ SYMPTOMS:  He denies any pain or injury to his neck back chest abdomen or other extremities. Unable to ambulate after the fall.  EXACERBATING/ MITIGATING FACTORS:   Discomfort is worse to touch and with movement.     Review of Systems     Constitutional: No fever chills or malaise.  HENT: No headache sore throat or congestion.No ear pain.  Cardiovascular: No chest pain or palpitations.   Respiratory: No cough or shortness of breath   Gastrointestinal: No nausea vomiting or abdominal pain. No diarrhea or constipation.  Genitourinary: No dysuria frequency or hematuria.    All other systems reviewed and all are negative.     Allergies     Pt is allergic to influenza virus vaccine.    Medications     No current facility-administered medications for this encounter.    Current Outpatient Medications:     amLODIPine (NORVASC) 5 MG tablet, TAKE 1 TABLET (5 MG TOTAL) BY MOUTH DAILY., Disp: 30 tablet, Rfl: 11    aspirin EC 81 MG EC tablet, Take 1 tablet (81 mg total) by mouth every evening., Disp: 90 tablet, Rfl: 3     atorvastatin (LIPITOR) 40 MG tablet, TAKE 1 TABLET BY MOUTH ONCE DAILY, Disp: 90 tablet, Rfl: 2    Blood Glucose Monitoring Suppl (RELION PRIME MONITOR) Device, Use USE TO TEST BLOOD GLUCOSE FOUR TIMES DAILY, Disp: , Rfl:     Continuous Blood Gluc Receiver (Dexcom G6 Receiver) Device, 1 Device by Does not apply route daily, Disp: 1 Device, Rfl: 0    Continuous Blood Gluc Sensor (Dexcom G6 Sensor) Misc, 1 each by Does not apply route every 10 (ten) days ICD 10 code:  E11.65, Disp: 9 each, Rfl: 3    Continuous Blood Gluc Transmit (Dexcom G6 Transmitter) Misc, For use with continuous glucose monitoring.  ICD 10 code:  E11.65, Disp: 1 each, Rfl: 1    gabapentin (NEURONTIN) 300 MG capsule, Take 1 capsule (300 mg total) by mouth 3 (three) times daily, Disp: 90 capsule, Rfl: 5    glucose blood test strip, 1 each by Other route 4 (four) times daily Use as instructed, Disp: , Rfl:     insulin glargine (BASAGLAR KWIKPEN) 100 UNIT/ML injection pen, Inject 68 Units into the skin daily, Disp: 30 mL, Rfl: 1    Insulin Pen Needle 31G X 6 MM Misc, Use Use up to 7 times a day as directed, Disp: ,  Rfl:     liraglutide (Victoza) 18 MG/3ML injection, INJECT 1.'8MG'$  SUBCUTANEOUSLY ONCE DAILY, Disp: 9 mL, Rfl: 5    Multiple Vitamin (multivitamin) capsule, Take 1 capsule by mouth daily, Disp: , Rfl:     NovoLOG FlexPen 100 UNIT/ML injection pen, Take 6 units before breakfast and lunch, take 10 units before supper, PLUS add correction scale. Max daily dose 60 units, Disp: 15 mL, Rfl: 3    Vitamin D3 (CHOLECALCIFEROL) 50 MCG (2000 UT) tablet, Take 1 tablet (50 mcg total) by mouth daily, Disp: , Rfl:     Glucagon (Baqsimi Two Pack) 3 MG/DOSE Powder, 1 each by Nasal route as needed (severe HYPOglycemia), Disp: 1 each, Rfl: 1    HYDROcodone-acetaminophen (NORCO) 5-325 MG per tablet, Take 1 tablet by mouth every 6 (six) hours as needed for Pain No driving or working while taking.  Avoid alcohol while taking, Disp: 15 tablet, Rfl: 0    ondansetron  (ZOFRAN) 8 MG tablet, Take 1 tablet (8 mg total) by mouth every 6 (six) hours as needed for Nausea, Disp: 10 tablet, Rfl: 0     Past Medical History     Pt has a past medical history of Abnormal vision, Complication of anesthesia, Diabetes mellitus, Diabetic ulcer of left foot (12/26/14), Disc, End-stage renal disease (2021), Gastroesophageal reflux disease, Hyperlipidemia, Hypertension, Seasonal allergic rhinitis, Sleep apnea, and Type 2 diabetes mellitus, controlled.    Past Surgical History     Pt has a past surgical history that includes Hand surgery; DEBRIDEMENT & IRRIGATION, LOWER EXTREMITY (Left, 03/12/2015); ARTHRODESIS, TOE (Left, 05/04/2015); DEBRIDEMENT & IRRIGATION, LOWER EXTREMITY (Left, 03/07/2016); and SESAMOIDECTOMY (Left, 05/24/2019).    Family History     The family history includes COPD in his sister; Cancer in his mother and sister; Depression in his sister; Diabetes in his mother; Hypertension in his brother; Lung cancer in his mother.    Social History     Pt reports that he has been smoking cigarettes. He has been smoking an average of 0.25 packs per day. He has never used smokeless tobacco. He reports that he does not drink alcohol and does not use drugs.    Physical Exam     Blood pressure 151/73, pulse 97, temperature 98.4 F (36.9 C), temperature source Oral, resp. rate 17, height 1.727 m, weight 117 kg, SpO2 97 %.    GENERAL: The patient is well-developed, well-nourished, obese  male whom appears in moderate discomfort and in no distress.   There is no evidence of respiratory distress.    NEURO/ PSYCH: Normal affect and mood. Patient is alert, and oriented to person place and circumstance.   HEENT:   Normocephalic atraumatic head.   Pupils are equal, round, and  Extra-ocular muscles are intact bilaterally.   NECK: Supple, free range of motion, no tenderness   noted.  CHEST:  No chest wall tenderness.    LUNGS: Clear to auscultation bilaterally.    HEART: Regular rate and rhythm    ABDOMEN:  Soft, non-distended,   No tenderness noted.  No CVAT.  SKIN: Warm, dry, mucous membranes moist, normal turgor, no rash noted.  EXTREMITIES:   Left lower extremity: No shortening deformity or abnormal rotation. The patient has a high riding left patella. There is tenderness to the patella. He is not able to extend the left leg below the knee. Negative stress testing of the medial/lateral collateral ligaments. Negative stress testing of the anterior/posterior cruciate ligaments but difficult due to pain. Tenderness to the left  hip femur or left leg below the knee including the ankle and foot. Distal pulses are intact as is sensation. The patient has a cam walker boot on his right lower leg which he uses chronically. Extremities otherwise reveal:  No gross visible deformity , free range of motion.  No edema or cyanosis.       Orders Placed     Orders Placed This Encounter   Procedures    XR Knee Left 4+ Views    Education: Walking With Ambulatory Aid - Crutch training partial weight bearing       ED Medication Orders (From admission, onward)      None            Diagnostic Results       The results of the diagnostic studies below have been reviewed by myself:    Radiologic Studies  Radiology Results (24 Hour)       Procedure Component Value Units Date/Time    XR Knee Left 4+ Views [id number] Collected: 09/06/20 1021    Order Status: Completed Updated: 09/06/20 1024    Narrative:      Examination: Left knee radiographs    Clinical History: fall, swelling    Comparison(s): None available    Technique: 5 views of the left knee      Impression:        *  Acute, transversely oriented, mildly comminuted fracture of the lower pole of the patella. There is approximately 2.6 cm of distraction of the fracture fragments. Associated prepatellar soft tissue edema.      ReadingStation:[email or web address]            MDM / Critical Care     Differential diagnosis considered includes:  Patellar  fracture, patellar tendon rupture, other  ligamentous injury.     [place] prescription monitoring program reviewed.   No Significant recent Controlled substance/opioid Narcotic  prescriptions listed over the past 24 months.     Overdose risk score: 340/999.     Narx Scores:  Narcotic 160 , Sedative: 70  Stimulant:  0    Additional risk factors 0     1 Narcotic prescription past yer in was in April 2022 for oxycodone 7 day supply.     Diagnosis / Disposition     Clinical Impression  1. Closed displaced transverse fracture of left patella, initial encounter    2. Patellar tendon rupture, left, initial encounter        Disposition  ED Disposition       ED Disposition   Discharge    Condition   --    Date/Time   Sun Sep 06, 2020 11:02 AM    Comment   [name] discharge to home/self care.    Condition at disposition: Stable                 Eugenie Filler, [address]  [place] [address]  [phone]    Follow up in 2 day(s)  Return to the Emergency Department if symptoms worsen or if you have any questions whatsoever. Feel free to call at anytime with questions about your diagnosis and/or discharge instructions etc.      Prescriptions  New Prescriptions    HYDROCODONE-ACETAMINOPHEN (NORCO) 5-325 MG PER TABLET    Take 1 tablet by mouth every 6 (six) hours as needed for Pain No driving or working while taking.  Avoid alcohol while taking  __________________________________________  ###________________________________________       Gwen Pounds, MD  09/06/20 1114

## 2020-09-08 ENCOUNTER — Ambulatory Visit (RURAL_HEALTH_CENTER): Payer: Self-pay

## 2020-09-10 ENCOUNTER — Ambulatory Visit: Payer: Self-pay | Attending: Orthopaedic Surgery | Admitting: Orthopaedic Surgery

## 2020-09-10 ENCOUNTER — Ambulatory Visit
Admission: RE | Admit: 2020-09-10 | Discharge: 2020-09-10 | Disposition: A | Discharge status: Home / Self Care | Payer: Self-pay | Source: Ambulatory Visit | Attending: Orthopaedic Surgery | Admitting: Orthopaedic Surgery

## 2020-09-10 ENCOUNTER — Encounter (RURAL_HEALTH_CENTER): Payer: Self-pay | Admitting: Orthopaedic Surgery

## 2020-09-10 VITALS — Ht 68.0 in | Wt 257.0 lb

## 2020-09-10 DIAGNOSIS — Z01818 Encounter for other preprocedural examination: Secondary | ICD-10-CM | POA: Insufficient documentation

## 2020-09-10 DIAGNOSIS — S82002A Unspecified fracture of left patella, initial encounter for closed fracture: Secondary | ICD-10-CM | POA: Insufficient documentation

## 2020-09-10 DIAGNOSIS — S82032A Displaced transverse fracture of left patella, initial encounter for closed fracture: Secondary | ICD-10-CM

## 2020-09-10 HISTORY — DX: Unspecified fracture of left patella, initial encounter for closed fracture: S82.002A

## 2020-09-10 HISTORY — DX: Displaced transverse fracture of left patella, initial encounter for closed fracture: S82.032A

## 2020-09-10 LAB — CBC AND DIFFERENTIAL
Basophils %: 0.8 % (ref 0.0–3.0)
Basophils Absolute: 0.1 10*3/uL (ref 0.0–0.3)
Eosinophils %: 4 % (ref 0.0–7.0)
Eosinophils Absolute: 0.5 10*3/uL (ref 0.0–0.8)
Hematocrit: 37.6 % — ABNORMAL LOW (ref 39.0–52.5)
Hemoglobin: 12.1 gm/dL — ABNORMAL LOW (ref 13.0–17.5)
Lymphocytes Absolute: 2.2 10*3/uL (ref 0.6–5.1)
Lymphocytes: 18.7 % (ref 15.0–46.0)
MCH: 31 pg (ref 28–35)
MCHC: 32 gm/dL (ref 31–36)
MCV: 95 fL (ref 80–100)
MPV: 8 fL (ref 6.0–10.0)
Monocytes Absolute: 0.5 10*3/uL (ref 0.1–1.7)
Monocytes: 4.7 % (ref 3.0–15.0)
Neutrophils %: 71.9 % (ref 42.0–78.0)
Neutrophils Absolute: 8.3 10*3/uL (ref 1.7–8.6)
PLT CT: 316 10*3/uL (ref 130–440)
RBC: 3.94 10*6/uL — ABNORMAL LOW (ref 4.00–5.70)
RDW: 12.7 % (ref 10.5–14.5)
WBC: 11.6 10*3/uL — ABNORMAL HIGH (ref 4.0–11.0)

## 2020-09-10 LAB — BASIC METABOLIC PANEL
Anion Gap: 15.3 mMol/L (ref 7.0–18.0)
BUN / Creatinine Ratio: 13.2 Ratio (ref 10.0–30.0)
BUN: 37 mg/dL — ABNORMAL HIGH (ref 7–22)
CO2: 25 mMol/L (ref 20.0–30.0)
Calcium: 9.6 mg/dL (ref 8.5–10.5)
Chloride: 106 mMol/L (ref 98–110)
Creatinine: 2.8 mg/dL — ABNORMAL HIGH (ref 0.80–1.30)
EGFR: 26 mL/min/{1.73_m2} — ABNORMAL LOW (ref 60–150)
Glucose: 128 mg/dL — ABNORMAL HIGH (ref 71–99)
Osmolality Calculated: 293 mOsm/kg (ref 275–300)
Potassium: 4.3 mMol/L (ref 3.5–5.3)
Sodium: 142 mMol/L (ref 136–147)

## 2020-09-10 MED ORDER — OXYCODONE-ACETAMINOPHEN 5-325 MG PO TABS
1.0000 | ORAL_TABLET | ORAL | 0 refills | Status: DC | PRN
Start: 2020-09-10 — End: 2020-09-21

## 2020-09-10 NOTE — H&P (View-Only) (Signed)
History and Physical  Assessment   Displaced transverse fracture inferior pole patella left knee  Type 2 diabetes mellitus  HTN, HLD  Diabetic peripheral neuropathy  CKD  Sleep apnea  Charcot feet      Plan:   I recommend ORIF of the displaced transverse fracture inferior pole patella left knee.  Risks of surgery include infection, scar tissue, bleeding, nerve or artery injury, blood clot, failure to heal, need for additional surgery, heart attack or death.  He is aware the risk.  He would like to proceed with surgery as discussed.  Surgery is now scheduled for Monday, 09/14/2020 at [facility].  Surgical consent is now signed.  Preop surgical patient educational information was provided to him.  I prefer him to wear his knee immobilizer and keep the knee in extension due to the significant displacement and comminution that is present at the fracture of the patella.    Prescription for Percocet was sent to his pharmacy.      .History of Present Illness:     Date Time: 09/10/2020 11:27 AM  Patient Name: [name], [name]  DoB: 1966-02-15  Age: 55 y.o.   PCP: Tanja Port, MD      Chief Complaint:     Chief Complaint   Patient presents with    Pre-op Exam     Left knee fracture DOI:09/06/20 for Pre Op today       HPI: This is a 55 year old male who presents for evaluation of an acute injury he sustained to the left knee.  He has a history of diabetes mellitus.  He had Charcot foot surgery at [facility] on the right foot.  He been on a knee walker up until recently.  He was down in the basement and took a fall.  He felt a pop and had severe pain and deformity affecting the left knee.  He was seen in the emergency room at [facility] on the date of injury on 09/06/2020.  He was found to have a displaced fracture of the patella.  He was placed in a knee immobilizer.  He is now here for orthopedic evaluation.  He is accompanied by his wife.  He is a new patient.    Past medical history:     The following  portions of the patient's history were reviewed and updated as appropriate:     Past Medical History:   Diagnosis Date    Abnormal vision     Complication of anesthesia     last time he was kicking his legs had to wake him up    Diabetes mellitus     Diabetic ulcer of left foot 12/26/14    Disc     End-stage renal disease 2021    Stage 3 Kidney Disease    Gastroesophageal reflux disease     Hyperlipidemia     Hypertension     Seasonal allergic rhinitis     Sleep apnea     not tested    Type 2 diabetes mellitus, controlled      Allergies   Allergen Reactions    Influenza Virus Vaccine Anaphylaxis     Current Outpatient Medications   Medication Sig Dispense Refill    amLODIPine (NORVASC) 5 MG tablet TAKE 1 TABLET (5 MG TOTAL) BY MOUTH DAILY. 30 tablet 11    aspirin EC 81 MG EC tablet Take 1 tablet (81 mg total) by mouth every evening. 90 tablet 3    atorvastatin (LIPITOR) 40 MG tablet  TAKE 1 TABLET BY MOUTH ONCE DAILY 90 tablet 2    Blood Glucose Monitoring Suppl (RELION PRIME MONITOR) Device Use USE TO TEST BLOOD GLUCOSE FOUR TIMES DAILY      Continuous Blood Gluc Receiver (Dexcom G6 Receiver) Device 1 Device by Does not apply route daily 1 Device 0    Continuous Blood Gluc Sensor (Dexcom G6 Sensor) Misc 1 each by Does not apply route every 10 (ten) days ICD 10 code:  E11.65 9 each 3    Continuous Blood Gluc Transmit (Dexcom G6 Transmitter) Misc For use with continuous glucose monitoring.  ICD 10 code:  E11.65 1 each 1    gabapentin (NEURONTIN) 300 MG capsule Take 1 capsule (300 mg total) by mouth 3 (three) times daily 90 capsule 5    Glucagon (Baqsimi Two Pack) 3 MG/DOSE Powder 1 each by Nasal route as needed (severe HYPOglycemia) 1 each 1    glucose blood test strip 1 each by Other route 4 (four) times daily Use as instructed      HYDROcodone-acetaminophen (NORCO) 5-325 MG per tablet Take 1 tablet by mouth every 6 (six) hours as needed for Pain No driving or working while taking.  Avoid alcohol while taking 15  tablet 0    insulin glargine (BASAGLAR KWIKPEN) 100 UNIT/ML injection pen Inject 68 Units into the skin daily 30 mL 1    Insulin Pen Needle 31G X 6 MM Misc Use Use up to 7 times a day as directed      liraglutide (Victoza) 18 MG/3ML injection INJECT 1.'8MG'$  SUBCUTANEOUSLY ONCE DAILY 9 mL 5    Multiple Vitamin (multivitamin) capsule Take 1 capsule by mouth daily      NovoLOG FlexPen 100 UNIT/ML injection pen Take 6 units before breakfast and lunch, take 10 units before supper, PLUS add correction scale. Max daily dose 60 units 15 mL 3    ondansetron (ZOFRAN) 8 MG tablet Take 1 tablet (8 mg total) by mouth every 6 (six) hours as needed for Nausea 10 tablet 0    Vitamin D3 (CHOLECALCIFEROL) 50 MCG (2000 UT) tablet Take 1 tablet (50 mcg total) by mouth daily       No current facility-administered medications for this visit.     Past Surgical History:   Procedure Laterality Date    ARTHRODESIS, TOE Left 05/04/2015    Procedure: ARTHRODESIS, TOE;  Surgeon: Janora Norlander, DPM;  Location: [facility];  Service: Podiatry;  Laterality: Left;  1st MPJ FUSION    DEBRIDEMENT & IRRIGATION, LOWER EXTREMITY Left 03/12/2015    Procedure: [place], LOWER EXTREMITY;  Surgeon: Janora Norlander, DPM;  Location: [facility];  Service: Podiatry;  Laterality: Left;  I&D LEFT FOOT FOR BONE INFECTION    DEBRIDEMENT & IRRIGATION, LOWER EXTREMITY Left 03/07/2016    Procedure: DEBRIDEMENT & IRRIGATION, LEFT FOOT 2ND TOE, EXCISION OF 2ND METATARSEL;  Surgeon: Janora Norlander, DPM;  Location: [facility];  Service: Podiatry;  Laterality: Left;  I&D Left Foot    HAND SURGERY      SESAMOIDECTOMY Left 05/24/2019    Procedure: SESAMOIDECTOMY;  Surgeon: [name], DPM;  Location: [facility];  Service: Podiatry;  Laterality: Left;     Social History     Socioeconomic History    Marital status: Single   Tobacco Use    Smoking status: Some Days     Packs/day: 0.25     Types: Cigarettes      Last attempt to  quit: 09/06/2017     Years since quitting: 3.0    Smokeless tobacco: Never    Tobacco comments:     3 packs a week   Vaping Use    Vaping Use: Never used   Substance and Sexual Activity    Alcohol use: No     Alcohol/week: 0.0 standard drinks     Comment: in past    Drug use: No     Comment: in past    Sexual activity: Yes     Partners: Female     Birth control/protection: None     family history includes COPD in his sister; Cancer in his mother and sister; Depression in his sister; Diabetes in his mother; Hypertension in his brother; Lung cancer in his mother.      Review of Systems  Constitution: Negative  Endocrine: Type 2 diabetes mellitus requiring insulin  Cardiac: HTN, HLD  Pulmonary: Sleep apnea  GU: End-stage renal disease  Neuro: Peripheral neuropathy and Charcot feet  MSK: See HPI  All other systems negative      Objective:     VITALS SIGNS:  Ht 1.727 m ('5\' 8"'$ )    Wt 116.6 kg (257 lb)    BMI 39.08 kg/m  Pain Score:9-severe pain  Physical Exam   Constitutional: he appears well-developed and well-nourished. No distress.   HENT: Head: Normocephalic and atraumatic.   Nose: Nose normal.   Mouth/Throat: Oropharynx is clear and moist.   Eyes: EOM are normal. Pupils are equal, round, and reactive to light.   Neck: Normal range of motion.   Cardiovascular: Normal rate, regular rhythm   Pulmonary/Chest: Effort normal and breath sounds normal. No respiratory distress.   Abdominal: Soft, nondistended, and nontender.     Neurological: he is alert.    Skin: Skin is warm and dry. No rash noted. he is not diaphoretic.   Psychiatric: he has a normal mood and affect. his behavior is normal. Thought content normal.     MSK: On exam of the left knee he has some healing superficial abrasions.  He has tenderness along the patella.  There is mild effusion.  He has palpable posterior tibial pulse.  Decreased sensation is present due to peripheral neuropathy in the feet.  There is a palpable defect of the  patella.    Imaging Review     X-rays of the left knee available for my review dated 09/06/2020 demonstrate a transverse fracture of the patella with wide displacement of the inferior pole.  There is comminution present.  A joint effusion is present.      I have reviewed the set of x-rays myself and with the patient.          Eugenie Filler, MD  09/10/2020  11:27 AM  This note was generated by the speech recognition software and may contain inherent errors or omissions not intended by the user. Grammatical errors, random word insertions, deletions,  are occasional consequences of this technology due to software limitations. Not all errors are corrected. If there are questions  about the content of this notethey should be addressed directly with the author for clarification.

## 2020-09-10 NOTE — H&P (Signed)
History and Physical  Assessment   Displaced transverse fracture inferior pole patella left knee  Type 2 diabetes mellitus  HTN, HLD  Diabetic peripheral neuropathy  CKD  Sleep apnea  Charcot feet      Plan:   I recommend ORIF of the displaced transverse fracture inferior pole patella left knee.  Risks of surgery include infection, scar tissue, bleeding, nerve or artery injury, blood clot, failure to heal, need for additional surgery, heart attack or death.  He is aware the risk.  He would like to proceed with surgery as discussed.  Surgery is now scheduled for Monday, 09/14/2020 at [facility].  Surgical consent is now signed.  Preop surgical patient educational information was provided to him.  I prefer him to wear his knee immobilizer and keep the knee in extension due to the significant displacement and comminution that is present at the fracture of the patella.    Prescription for Percocet was sent to his pharmacy.      .History of Present Illness:     Date Time: 09/10/2020 11:27 AM  Patient Name: [name], [name]  DoB: 1965/07/02  Age: 55 y.o.   PCP: Tanja Port, MD      Chief Complaint:     Chief Complaint   Patient presents with    Pre-op Exam     Left knee fracture DOI:09/06/20 for Pre Op today       HPI: This is a 55 year old male who presents for evaluation of an acute injury he sustained to the left knee.  He has a history of diabetes mellitus.  He had Charcot foot surgery at [facility] on the right foot.  He been on a knee walker up until recently.  He was down in the basement and took a fall.  He felt a pop and had severe pain and deformity affecting the left knee.  He was seen in the emergency room at [facility] on the date of injury on 09/06/2020.  He was found to have a displaced fracture of the patella.  He was placed in a knee immobilizer.  He is now here for orthopedic evaluation.  He is accompanied by his wife.  He is a new patient.    Past medical history:     The following  portions of the patient's history were reviewed and updated as appropriate:     Past Medical History:   Diagnosis Date    Abnormal vision     Complication of anesthesia     last time he was kicking his legs had to wake him up    Diabetes mellitus     Diabetic ulcer of left foot 12/26/14    Disc     End-stage renal disease 2021    Stage 3 Kidney Disease    Gastroesophageal reflux disease     Hyperlipidemia     Hypertension     Seasonal allergic rhinitis     Sleep apnea     not tested    Type 2 diabetes mellitus, controlled      Allergies   Allergen Reactions    Influenza Virus Vaccine Anaphylaxis     Current Outpatient Medications   Medication Sig Dispense Refill    amLODIPine (NORVASC) 5 MG tablet TAKE 1 TABLET (5 MG TOTAL) BY MOUTH DAILY. 30 tablet 11    aspirin EC 81 MG EC tablet Take 1 tablet (81 mg total) by mouth every evening. 90 tablet 3    atorvastatin (LIPITOR) 40 MG tablet  TAKE 1 TABLET BY MOUTH ONCE DAILY 90 tablet 2    Blood Glucose Monitoring Suppl (RELION PRIME MONITOR) Device Use USE TO TEST BLOOD GLUCOSE FOUR TIMES DAILY      Continuous Blood Gluc Receiver (Dexcom G6 Receiver) Device 1 Device by Does not apply route daily 1 Device 0    Continuous Blood Gluc Sensor (Dexcom G6 Sensor) Misc 1 each by Does not apply route every 10 (ten) days ICD 10 code:  E11.65 9 each 3    Continuous Blood Gluc Transmit (Dexcom G6 Transmitter) Misc For use with continuous glucose monitoring.  ICD 10 code:  E11.65 1 each 1    gabapentin (NEURONTIN) 300 MG capsule Take 1 capsule (300 mg total) by mouth 3 (three) times daily 90 capsule 5    Glucagon (Baqsimi Two Pack) 3 MG/DOSE Powder 1 each by Nasal route as needed (severe HYPOglycemia) 1 each 1    glucose blood test strip 1 each by Other route 4 (four) times daily Use as instructed      HYDROcodone-acetaminophen (NORCO) 5-325 MG per tablet Take 1 tablet by mouth every 6 (six) hours as needed for Pain No driving or working while taking.  Avoid alcohol while taking 15  tablet 0    insulin glargine (BASAGLAR KWIKPEN) 100 UNIT/ML injection pen Inject 68 Units into the skin daily 30 mL 1    Insulin Pen Needle 31G X 6 MM Misc Use Use up to 7 times a day as directed      liraglutide (Victoza) 18 MG/3ML injection INJECT 1.'8MG'$  SUBCUTANEOUSLY ONCE DAILY 9 mL 5    Multiple Vitamin (multivitamin) capsule Take 1 capsule by mouth daily      NovoLOG FlexPen 100 UNIT/ML injection pen Take 6 units before breakfast and lunch, take 10 units before supper, PLUS add correction scale. Max daily dose 60 units 15 mL 3    ondansetron (ZOFRAN) 8 MG tablet Take 1 tablet (8 mg total) by mouth every 6 (six) hours as needed for Nausea 10 tablet 0    Vitamin D3 (CHOLECALCIFEROL) 50 MCG (2000 UT) tablet Take 1 tablet (50 mcg total) by mouth daily       No current facility-administered medications for this visit.     Past Surgical History:   Procedure Laterality Date    ARTHRODESIS, TOE Left 05/04/2015    Procedure: ARTHRODESIS, TOE;  Surgeon: Janora Norlander, DPM;  Location: [facility];  Service: Podiatry;  Laterality: Left;  1st MPJ FUSION    DEBRIDEMENT & IRRIGATION, LOWER EXTREMITY Left 03/12/2015    Procedure: [place], LOWER EXTREMITY;  Surgeon: Janora Norlander, DPM;  [facility];  Service: Podiatry;  Laterality: Left;  I&D LEFT FOOT FOR BONE INFECTION    DEBRIDEMENT & IRRIGATION, LOWER EXTREMITY Left 03/07/2016    Procedure: DEBRIDEMENT & IRRIGATION, LEFT FOOT 2ND TOE, EXCISION OF 2ND METATARSEL;  Surgeon: Janora Norlander, DPM;  [facility];  Service: Podiatry;  Laterality: Left;  I&D Left Foot    HAND SURGERY      SESAMOIDECTOMY Left 05/24/2019    Procedure: SESAMOIDECTOMY;  Surgeon: [name], DPM;  Location: [place];  Service: Podiatry;  Laterality: Left;     Social History     Socioeconomic History    Marital status: Single   Tobacco Use    Smoking status: Some Days     Packs/day: 0.25     Types: Cigarettes      Last attempt to  quit: 09/06/2017     Years since quitting: 3.0    Smokeless tobacco: Never    Tobacco comments:     3 packs a week   Vaping Use    Vaping Use: Never used   Substance and Sexual Activity    Alcohol use: No     Alcohol/week: 0.0 standard drinks     Comment: in past    Drug use: No     Comment: in past    Sexual activity: Yes     Partners: Female     Birth control/protection: None     family history includes COPD in his sister; Cancer in his mother and sister; Depression in his sister; Diabetes in his mother; Hypertension in his brother; Lung cancer in his mother.      Review of Systems  Constitution: Negative  Endocrine: Type 2 diabetes mellitus requiring insulin  Cardiac: HTN, HLD  Pulmonary: Sleep apnea  GU: End-stage renal disease  Neuro: Peripheral neuropathy and Charcot feet  MSK: See HPI  All other systems negative      Objective:     VITALS SIGNS:  Ht 1.727 m ('5\' 8"'$ )    Wt 116.6 kg (257 lb)    BMI 39.08 kg/m  Pain Score:9-severe pain  Physical Exam   Constitutional: he appears well-developed and well-nourished. No distress.   HENT: Head: Normocephalic and atraumatic.   Nose: Nose normal.   Mouth/Throat: Oropharynx is clear and moist.   Eyes: EOM are normal. Pupils are equal, round, and reactive to light.   Neck: Normal range of motion.   Cardiovascular: Normal rate, regular rhythm   Pulmonary/Chest: Effort normal and breath sounds normal. No respiratory distress.   Abdominal: Soft, nondistended, and nontender.     Neurological: he is alert.    Skin: Skin is warm and dry. No rash noted. he is not diaphoretic.   Psychiatric: he has a normal mood and affect. his behavior is normal. Thought content normal.     MSK: On exam of the left knee he has some healing superficial abrasions.  He has tenderness along the patella.  There is mild effusion.  He has palpable posterior tibial pulse.  Decreased sensation is present due to peripheral neuropathy in the feet.  There is a palpable defect of the  patella.    Imaging Review     X-rays of the left knee available for my review dated 09/06/2020 demonstrate a transverse fracture of the patella with wide displacement of the inferior pole.  There is comminution present.  A joint effusion is present.      I have reviewed the set of x-rays myself and with the patient.          Eugenie Filler, MD  09/10/2020  11:27 AM  This note was generated by the speech recognition software and may contain inherent errors or omissions not intended by the user. Grammatical errors, random word insertions, deletions,  are occasional consequences of this technology due to software limitations. Not all errors are corrected. If there are questions  about the content of this notethey should be addressed directly with the author for clarification.

## 2020-09-10 NOTE — H&P (View-Only) (Signed)
History and Physical  Assessment   Displaced transverse fracture inferior pole patella left knee  Type 2 diabetes mellitus  HTN, HLD  Diabetic peripheral neuropathy  CKD  Sleep apnea  Charcot feet      Plan:   I recommend ORIF of the displaced transverse fracture inferior pole patella left knee.  Risks of surgery include infection, scar tissue, bleeding, nerve or artery injury, blood clot, failure to heal, need for additional surgery, heart attack or death.  He is aware the risk.  He would like to proceed with surgery as discussed.  Surgery is now scheduled for Monday, 09/14/2020 at [facility].  Surgical consent is now signed.  Preop surgical patient educational information was provided to him.  I prefer him to wear his knee immobilizer and keep the knee in extension due to the significant displacement and comminution that is present at the fracture of the patella.    Prescription for Percocet was sent to his pharmacy.      .History of Present Illness:     Date Time: 09/10/2020 11:27 AM  Patient Name: [name], [name]  DoB: [birth year]  Age: 55 y.o.   PCP: Tanja Port, MD      Chief Complaint:     Chief Complaint   Patient presents with    Pre-op Exam     Left knee fracture DOI:09/06/20 for Pre Op today       HPI: This is a 55 year old male who presents for evaluation of an acute injury he sustained to the left knee.  He has a history of diabetes mellitus.  He had Charcot foot surgery at [facility] on the right foot.  He been on a knee walker up until recently.  He was down in the basement and took a fall.  He felt a pop and had severe pain and deformity affecting the left knee.  He was seen in the emergency room at [facility] on the date of injury on 09/06/2020.  He was found to have a displaced fracture of the patella.  He was placed in a knee immobilizer.  He is now here for orthopedic evaluation.  He is accompanied by his wife.  He is a new patient.    Past medical history:     The following  portions of the patient's history were reviewed and updated as appropriate:     Past Medical History:   Diagnosis Date    Abnormal vision     Complication of anesthesia     last time he was kicking his legs had to wake him up    Diabetes mellitus     Diabetic ulcer of left foot 12/26/14    Disc     End-stage renal disease 2021    Stage 3 Kidney Disease    Gastroesophageal reflux disease     Hyperlipidemia     Hypertension     Seasonal allergic rhinitis     Sleep apnea     not tested    Type 2 diabetes mellitus, controlled      Allergies   Allergen Reactions    Influenza Virus Vaccine Anaphylaxis     Current Outpatient Medications   Medication Sig Dispense Refill    amLODIPine (NORVASC) 5 MG tablet TAKE 1 TABLET (5 MG TOTAL) BY MOUTH DAILY. 30 tablet 11    aspirin EC 81 MG EC tablet Take 1 tablet (81 mg total) by mouth every evening. 90 tablet 3    atorvastatin (LIPITOR) 40 MG tablet  TAKE 1 TABLET BY MOUTH ONCE DAILY 90 tablet 2    Blood Glucose Monitoring Suppl (RELION PRIME MONITOR) Device Use USE TO TEST BLOOD GLUCOSE FOUR TIMES DAILY      Continuous Blood Gluc Receiver (Dexcom G6 Receiver) Device 1 Device by Does not apply route daily 1 Device 0    Continuous Blood Gluc Sensor (Dexcom G6 Sensor) Misc 1 each by Does not apply route every 10 (ten) days ICD 10 code:  E11.65 9 each 3    Continuous Blood Gluc Transmit (Dexcom G6 Transmitter) Misc For use with continuous glucose monitoring.  ICD 10 code:  E11.65 1 each 1    gabapentin (NEURONTIN) 300 MG capsule Take 1 capsule (300 mg total) by mouth 3 (three) times daily 90 capsule 5    Glucagon (Baqsimi Two Pack) 3 MG/DOSE Powder 1 each by Nasal route as needed (severe HYPOglycemia) 1 each 1    glucose blood test strip 1 each by Other route 4 (four) times daily Use as instructed      HYDROcodone-acetaminophen (NORCO) 5-325 MG per tablet Take 1 tablet by mouth every 6 (six) hours as needed for Pain No driving or working while taking.  Avoid alcohol while taking 15  tablet 0    insulin glargine (BASAGLAR KWIKPEN) 100 UNIT/ML injection pen Inject 68 Units into the skin daily 30 mL 1    Insulin Pen Needle 31G X 6 MM Misc Use Use up to 7 times a day as directed      liraglutide (Victoza) 18 MG/3ML injection INJECT 1.'8MG'$  SUBCUTANEOUSLY ONCE DAILY 9 mL 5    Multiple Vitamin (multivitamin) capsule Take 1 capsule by mouth daily      NovoLOG FlexPen 100 UNIT/ML injection pen Take 6 units before breakfast and lunch, take 10 units before supper, PLUS add correction scale. Max daily dose 60 units 15 mL 3    ondansetron (ZOFRAN) 8 MG tablet Take 1 tablet (8 mg total) by mouth every 6 (six) hours as needed for Nausea 10 tablet 0    Vitamin D3 (CHOLECALCIFEROL) 50 MCG (2000 UT) tablet Take 1 tablet (50 mcg total) by mouth daily       No current facility-administered medications for this visit.     Past Surgical History:   Procedure Laterality Date    ARTHRODESIS, TOE Left 05/04/2015    Procedure: ARTHRODESIS, TOE;  Surgeon: Janora Norlander, DPM;  Location: [facility];  Service: Podiatry;  Laterality: Left;  1st MPJ FUSION    DEBRIDEMENT & IRRIGATION, LOWER EXTREMITY Left 03/12/2015    Procedure: [place], LOWER EXTREMITY;  Surgeon: Janora Norlander, DPM;  [facility];  Service: Podiatry;  Laterality: Left;  I&D LEFT FOOT FOR BONE INFECTION    DEBRIDEMENT & IRRIGATION, LOWER EXTREMITY Left 03/07/2016    Procedure: DEBRIDEMENT & IRRIGATION, LEFT FOOT 2ND TOE, EXCISION OF 2ND METATARSEL;  Surgeon: Janora Norlander, DPM;  [facility];  Service: Podiatry;  Laterality: Left;  I&D Left Foot    HAND SURGERY      SESAMOIDECTOMY Left 05/24/2019    Procedure: SESAMOIDECTOMY;  Surgeon: [name], DPM;  Location: [place];  Service: Podiatry;  Laterality: Left;     Social History     Socioeconomic History    Marital status: Single   Tobacco Use    Smoking status: Some Days     Packs/day: 0.25     Types: Cigarettes      Last attempt to  quit: 09/06/2017     Years since quitting: 3.0    Smokeless tobacco: Never    Tobacco comments:     3 packs a week   Vaping Use    Vaping Use: Never used   Substance and Sexual Activity    Alcohol use: No     Alcohol/week: 0.0 standard drinks     Comment: in past    Drug use: No     Comment: in past    Sexual activity: Yes     Partners: Female     Birth control/protection: None     family history includes COPD in his sister; Cancer in his mother and sister; Depression in his sister; Diabetes in his mother; Hypertension in his brother; Lung cancer in his mother.      Review of Systems  Constitution: Negative  Endocrine: Type 2 diabetes mellitus requiring insulin  Cardiac: HTN, HLD  Pulmonary: Sleep apnea  GU: End-stage renal disease  Neuro: Peripheral neuropathy and Charcot feet  MSK: See HPI  All other systems negative      Objective:     VITALS SIGNS:  Ht 1.727 m ('5\' 8"'$ )   Wt 116.6 kg (257 lb)   BMI 39.08 kg/m  Pain Score:9-severe pain  Physical Exam   Constitutional: he appears well-developed and well-nourished. No distress.   HENT: Head: Normocephalic and atraumatic.   Nose: Nose normal.   Mouth/Throat: Oropharynx is clear and moist.   Eyes: EOM are normal. Pupils are equal, round, and reactive to light.   Neck: Normal range of motion.   Cardiovascular: Normal rate, regular rhythm   Pulmonary/Chest: Effort normal and breath sounds normal. No respiratory distress.   Abdominal: Soft, nondistended, and nontender.     Neurological: he is alert.    Skin: Skin is warm and dry. No rash noted. he is not diaphoretic.   Psychiatric: he has a normal mood and affect. his behavior is normal. Thought content normal.     MSK: On exam of the left knee he has some healing superficial abrasions.  He has tenderness along the patella.  There is mild effusion.  He has palpable posterior tibial pulse.  Decreased sensation is present due to peripheral neuropathy in the feet.  There is a palpable defect of the  patella.    Imaging Review     X-rays of the left knee available for my review dated 09/06/2020 demonstrate a transverse fracture of the patella with wide displacement of the inferior pole.  There is comminution present.  A joint effusion is present.      I have reviewed the set of x-rays myself and with the patient.          Eugenie Filler, MD  09/10/2020  11:27 AM  This note was generated by the speech recognition software and may contain inherent errors or omissions not intended by the user. Grammatical errors, random word insertions, deletions,  are occasional consequences of this technology due to software limitations. Not all errors are corrected. If there are questions  about the content of this notethey should be addressed directly with the author for clarification.

## 2020-09-10 NOTE — Progress Notes (Signed)
See preoperative history and physical

## 2020-09-10 NOTE — Patient Instructions (Signed)
Please make sure you follow up with the prescribing physicians for any medications that you have elected to no longer take as prescribed.  PRESCRIPTION MEDICATION GUIDELINES     This practice treats injury and disease by surgery, casting, or supervised physical therapy.  Prescription medications only supplement these aspects of your care.                1.  Medications are only to be used by the person for whom they are prescribed and only for the purpose for which they are prescribed.     2.  All medications should be used only in the doses prescribed and no more frequently than the prescribed schedule.  Early refills will not be given if you are not taking medication as prescribed.     3.  Lost or stolen prescriptions or pills will not be replaced, so keep your medication in a safe and secure location.     4.  Prescriptions are designed to last until your next scheduled appointment.  Confirm any planned trips or other scheduling considerations before leaving the examination room so that the correct number of doses can be prescribed.  Prescriptions are only provided on scheduled appointments.     5.  You are required to inform every doctor that you see about all medications prescribed by any other doctor, especially narcotics.  If you are receiving narcotics of any kind from this practice, you should not be receiving them from any other provider.     6.  If, after a brief trial of medication, we determine that your problem is best managed by medicine alone, you will be asked to secure long term medical management from your medical physician.  This is a surgical practice.  Narcotics will be given for short term use only.  NO EXCEPTIONS.     7.  Failure to give these medications their proper respect and abide by these principles will result in the complete discontinuation of prescription medications for you from this practice.     8.  All narcotic use is monitored through the [place] Prescription Monitoring  Program.     9.  Phone, fax, or email prescriptions will not be offered in place of scheduled appointments.  We do not respond to requests for medication by phone, fax, or email.     Failure to comply with the above terms can or will result in dismissal from the practice.     Chronic pain is best managed and coordinated by primary care providers or a pain specialist.     [place] Law only allows a short course of pain medication.  Generally, most patients require no more than a seven day supply.     Drug Use/Abuse Resources     For more information on drug treatment services in your area, call 2-1-1 or visit www.211.org and enter your zip code.     Concern Hotline offers local crisis intervention and referral.  Calls are answered 24/7.                 [phone]  ([place], [place], [place])              [phone]  ([place])              [phone]  ([place])              [phone]  ([place]     For suggestions on safe drug storage, visit www.fda.gov/lockitup.       For drug information geared to teens, parents, and health professionals, visit www.drugabuse.gov.     If you were given a referral today, all office notes and insurance information will be sent from us to the office you are being referred.  Please give the office you are being sent to 5-7 business days to get in touch with you to schedule the appointment.   If after 5-7 business days, you have not heard from the office you were referred, please contact them directly.   If the appointment is still not made after 2 weeks, please do not hesitate to contact us for assistance.

## 2020-09-11 ENCOUNTER — Encounter: Payer: Self-pay | Admitting: Orthopaedic Surgery

## 2020-09-11 ENCOUNTER — Other Ambulatory Visit
Admission: RE | Admit: 2020-09-11 | Discharge: 2020-09-11 | Disposition: A | Discharge status: Home / Self Care | Payer: Self-pay | Source: Ambulatory Visit | Attending: Orthopaedic Surgery | Admitting: Orthopaedic Surgery

## 2020-09-11 ENCOUNTER — Telehealth: Payer: Self-pay

## 2020-09-12 LAB — VH APTIMA SARS-COV-2 ASSAY (PANTHER SYSTEM)(TM): Aptima SARS-CoV-2: NEGATIVE

## 2020-09-14 ENCOUNTER — Encounter: Payer: Self-pay | Admitting: Orthopaedic Surgery

## 2020-09-14 ENCOUNTER — Encounter: Payer: Self-pay | Admitting: Anesthesiology

## 2020-09-14 ENCOUNTER — Encounter
Admission: RE | Disposition: A | Discharge status: Home / Self Care | Payer: Self-pay | Source: Ambulatory Visit | Attending: Orthopaedic Surgery

## 2020-09-14 ENCOUNTER — Emergency Department
Admission: EM | Admit: 2020-09-14 | Discharge: 2020-09-14 | Disposition: A | Discharge status: Home / Self Care | Payer: Self-pay | Attending: Nurse Practitioner | Admitting: Nurse Practitioner

## 2020-09-14 ENCOUNTER — Emergency Department: Payer: Self-pay

## 2020-09-14 ENCOUNTER — Ambulatory Visit
Admission: RE | Admit: 2020-09-14 | Discharge: 2020-09-14 | Disposition: A | Discharge status: Home / Self Care | Payer: Self-pay | Source: Ambulatory Visit | Attending: Orthopaedic Surgery | Admitting: Orthopaedic Surgery

## 2020-09-14 DIAGNOSIS — F17210 Nicotine dependence, cigarettes, uncomplicated: Secondary | ICD-10-CM | POA: Insufficient documentation

## 2020-09-14 DIAGNOSIS — Z794 Long term (current) use of insulin: Secondary | ICD-10-CM | POA: Insufficient documentation

## 2020-09-14 DIAGNOSIS — Z833 Family history of diabetes mellitus: Secondary | ICD-10-CM | POA: Insufficient documentation

## 2020-09-14 DIAGNOSIS — K219 Gastro-esophageal reflux disease without esophagitis: Secondary | ICD-10-CM | POA: Insufficient documentation

## 2020-09-14 DIAGNOSIS — G4730 Sleep apnea, unspecified: Secondary | ICD-10-CM | POA: Insufficient documentation

## 2020-09-14 DIAGNOSIS — E1142 Type 2 diabetes mellitus with diabetic polyneuropathy: Secondary | ICD-10-CM | POA: Insufficient documentation

## 2020-09-14 DIAGNOSIS — Z981 Arthrodesis status: Secondary | ICD-10-CM | POA: Insufficient documentation

## 2020-09-14 DIAGNOSIS — N189 Chronic kidney disease, unspecified: Secondary | ICD-10-CM | POA: Insufficient documentation

## 2020-09-14 DIAGNOSIS — I129 Hypertensive chronic kidney disease with stage 1 through stage 4 chronic kidney disease, or unspecified chronic kidney disease: Secondary | ICD-10-CM | POA: Insufficient documentation

## 2020-09-14 DIAGNOSIS — E785 Hyperlipidemia, unspecified: Secondary | ICD-10-CM | POA: Insufficient documentation

## 2020-09-14 DIAGNOSIS — Z5309 Procedure and treatment not carried out because of other contraindication: Secondary | ICD-10-CM | POA: Insufficient documentation

## 2020-09-14 DIAGNOSIS — Z818 Family history of other mental and behavioral disorders: Secondary | ICD-10-CM | POA: Insufficient documentation

## 2020-09-14 DIAGNOSIS — S82032A Displaced transverse fracture of left patella, initial encounter for closed fracture: Secondary | ICD-10-CM | POA: Insufficient documentation

## 2020-09-14 DIAGNOSIS — E1122 Type 2 diabetes mellitus with diabetic chronic kidney disease: Secondary | ICD-10-CM | POA: Insufficient documentation

## 2020-09-14 DIAGNOSIS — Z7982 Long term (current) use of aspirin: Secondary | ICD-10-CM | POA: Insufficient documentation

## 2020-09-14 DIAGNOSIS — D72829 Elevated white blood cell count, unspecified: Secondary | ICD-10-CM | POA: Insufficient documentation

## 2020-09-14 DIAGNOSIS — S82002S Unspecified fracture of left patella, sequela: Secondary | ICD-10-CM | POA: Insufficient documentation

## 2020-09-14 DIAGNOSIS — Z809 Family history of malignant neoplasm, unspecified: Secondary | ICD-10-CM | POA: Insufficient documentation

## 2020-09-14 DIAGNOSIS — H547 Unspecified visual loss: Secondary | ICD-10-CM | POA: Insufficient documentation

## 2020-09-14 DIAGNOSIS — Z8249 Family history of ischemic heart disease and other diseases of the circulatory system: Secondary | ICD-10-CM | POA: Insufficient documentation

## 2020-09-14 DIAGNOSIS — X58XXXA Exposure to other specified factors, initial encounter: Secondary | ICD-10-CM | POA: Insufficient documentation

## 2020-09-14 DIAGNOSIS — Z887 Allergy status to serum and vaccine status: Secondary | ICD-10-CM | POA: Insufficient documentation

## 2020-09-14 DIAGNOSIS — J302 Other seasonal allergic rhinitis: Secondary | ICD-10-CM | POA: Insufficient documentation

## 2020-09-14 DIAGNOSIS — N183 Chronic kidney disease, stage 3 unspecified: Secondary | ICD-10-CM | POA: Insufficient documentation

## 2020-09-14 DIAGNOSIS — Z825 Family history of asthma and other chronic lower respiratory diseases: Secondary | ICD-10-CM | POA: Insufficient documentation

## 2020-09-14 DIAGNOSIS — Z801 Family history of malignant neoplasm of trachea, bronchus and lung: Secondary | ICD-10-CM | POA: Insufficient documentation

## 2020-09-14 DIAGNOSIS — Z8639 Personal history of other endocrine, nutritional and metabolic disease: Secondary | ICD-10-CM | POA: Insufficient documentation

## 2020-09-14 DIAGNOSIS — L03116 Cellulitis of left lower limb: Secondary | ICD-10-CM | POA: Insufficient documentation

## 2020-09-14 HISTORY — DX: Low back pain, unspecified: M54.50

## 2020-09-14 HISTORY — DX: Polyneuropathy, unspecified: G62.9

## 2020-09-14 HISTORY — DX: Unspecified osteoarthritis, unspecified site: M19.90

## 2020-09-14 LAB — COMPREHENSIVE METABOLIC PANEL
ALT: 27 U/L (ref 0–55)
AST (SGOT): 18 U/L (ref 10–42)
Albumin/Globulin Ratio: 0.62 Ratio — ABNORMAL LOW (ref 0.80–2.00)
Albumin: 2.4 gm/dL — ABNORMAL LOW (ref 3.5–5.0)
Alkaline Phosphatase: 162 U/L — ABNORMAL HIGH (ref 40–145)
Anion Gap: 17.3 mMol/L (ref 7.0–18.0)
BUN / Creatinine Ratio: 14.9 Ratio (ref 10.0–30.0)
BUN: 41 mg/dL — ABNORMAL HIGH (ref 7–22)
Bilirubin, Total: 0.2 mg/dL (ref 0.1–1.2)
CO2: 18.5 mMol/L — ABNORMAL LOW (ref 20.0–30.0)
Calcium: 9.2 mg/dL (ref 8.5–10.5)
Chloride: 109 mMol/L (ref 98–110)
Creatinine: 2.76 mg/dL — ABNORMAL HIGH (ref 0.80–1.30)
EGFR: 26 mL/min/{1.73_m2} — ABNORMAL LOW (ref 60–150)
Globulin: 3.9 gm/dL (ref 2.0–4.0)
Glucose: 217 mg/dL — ABNORMAL HIGH (ref 71–99)
Osmolality Calculated: 296 mOsm/kg (ref 275–300)
Potassium: 4.8 mMol/L (ref 3.5–5.3)
Protein, Total: 6.3 gm/dL (ref 6.0–8.3)
Sodium: 140 mMol/L (ref 136–147)

## 2020-09-14 LAB — CBC AND DIFFERENTIAL
Basophils %: 1 % (ref 0.0–3.0)
Basophils Absolute: 0.2 10*3/uL (ref 0.0–0.3)
Eosinophils %: 2.8 % (ref 0.0–7.0)
Eosinophils Absolute: 0.5 10*3/uL (ref 0.0–0.8)
Hematocrit: 34.5 % — ABNORMAL LOW (ref 39.0–52.5)
Hemoglobin: 10.7 gm/dL — ABNORMAL LOW (ref 13.0–17.5)
Lymphocytes Absolute: 2.1 10*3/uL (ref 0.6–5.1)
Lymphocytes: 13.2 % — ABNORMAL LOW (ref 15.0–46.0)
MCH: 30 pg (ref 28–35)
MCHC: 31 gm/dL (ref 31–36)
MCV: 98 fL (ref 80–100)
MPV: 8.1 fL (ref 6.0–10.0)
Monocytes Absolute: 0.7 10*3/uL (ref 0.1–1.7)
Monocytes: 4.1 % (ref 3.0–15.0)
Neutrophils %: 78.9 % — ABNORMAL HIGH (ref 42.0–78.0)
Neutrophils Absolute: 12.6 10*3/uL — ABNORMAL HIGH (ref 1.7–8.6)
PLT CT: 313 10*3/uL (ref 130–440)
RBC: 3.54 10*6/uL — ABNORMAL LOW (ref 4.00–5.70)
RDW: 13.1 % (ref 10.5–14.5)
WBC: 15.9 10*3/uL — ABNORMAL HIGH (ref 4.0–11.0)

## 2020-09-14 LAB — VH URINALYSIS WITH MICROSCOPIC AND CULTURE IF INDICATED
Bilirubin, UA: NEGATIVE mg/dL
Glucose, UA: 250 mg/dL — AB
Ketones UA: NEGATIVE mg/dL
Leukocyte Esterase, UA: NEGATIVE Leu/uL
Nitrite, UA: NEGATIVE
Protein, UR: >=300 mg/dL — AB
Urine Specific Gravity: 1.02 (ref 1.001–1.040)
Urobilinogen, UA: 0.2 mg/dL
pH, Urine: 6.5 pH (ref 5.0–8.0)

## 2020-09-14 SURGERY — ORIF, PATELLA
Anesthesia: Spinal | Site: Knee | Laterality: Left

## 2020-09-14 MED ORDER — DOXYCYCLINE MONOHYDRATE 100 MG PO CAPS
100.0000 mg | ORAL_CAPSULE | Freq: Once | ORAL | Status: AC
Start: 2020-09-14 — End: 2020-09-14
  Administered 2020-09-14: 15:00:00 100 mg via ORAL

## 2020-09-14 MED ORDER — DOXYCYCLINE MONOHYDRATE 100 MG PO CAPS
100.0000 mg | ORAL_CAPSULE | Freq: Two times a day (BID) | ORAL | 0 refills | Status: AC
Start: 2020-09-14 — End: 2020-09-24

## 2020-09-14 MED ORDER — DOXYCYCLINE MONOHYDRATE 100 MG PO CAPS
ORAL_CAPSULE | ORAL | Status: AC
Start: 2020-09-14 — End: ?
  Filled 2020-09-14: qty 1

## 2020-09-14 MED ORDER — SODIUM CHLORIDE (PF) 0.9 % IJ SOLN
3.0000 mL | Freq: Two times a day (BID) | INTRAMUSCULAR | Status: DC
Start: 2020-09-14 — End: 2020-09-14

## 2020-09-14 MED ORDER — CEFAZOLIN SODIUM-DEXTROSE 2-3 GM-%(50ML) IV SOLR
2.0000 g | INTRAVENOUS | Status: DC
Start: 2020-09-14 — End: 2020-09-14

## 2020-09-14 MED ORDER — LACTATED RINGERS IV SOLN
75.0000 mL/h | INTRAVENOUS | Status: DC
Start: 2020-09-14 — End: 2020-09-14

## 2020-09-14 SURGICAL SUPPLY — 24 items
BANDAGE ESMARK 6 (Dressings) ×2 IMPLANT
BLADE SCALPEL #10 (Supply) ×8 IMPLANT
CUFF TOURNIQUET 34 (Supply) IMPLANT
DRAPE C-ARM COVER 60 X 42 (Supply) ×2 IMPLANT
DRSG COMBINE 8 X 7.5 (Dressings) ×6 IMPLANT
DRSG XEROFORM 1 X 8 (Dressings) ×2 IMPLANT
FIBERWIRE # 2 AR-7200 (Supply) IMPLANT
GLOVE BIOGEL UND PI IND SZ 7.5 (Supply) IMPLANT
GLOVE BIOGEL UT M PI SUR SZ7.5 (Supply) ×2 IMPLANT
GOWN SURG XLG AURORA IMP SLV S (Supply) ×2 IMPLANT
IMMOBILIZER KNEE 22 (Supply) ×2 IMPLANT
NDL KEITH ABDOMINAL ST CUT (Supply) IMPLANT
PACK SMH BASIC ORTHOPEDIC (Supply) ×2 IMPLANT
PADDING CAST 6 X 4 (Supply) ×4 IMPLANT
SHEET DRAPE LARGE (Supply) ×4 IMPLANT
SPONGE GAUZE 4 X 4 12 PLY (Dressings) ×2 IMPLANT
SPONGE LAP 18 X 18 (Supply) ×4 IMPLANT
STERISTRIP 1/2 IN X 4 IN (Supply) IMPLANT
STOCKINETTE IMPERVIOUS LGE (Supply) ×2 IMPLANT
SUCTION TUBE FRAZIER 12FR (Supply) ×2 IMPLANT
SUT ETHILON 18 2-0 664H (Supply) ×2 IMPLANT
SUT VICRYL COATED 2-0 J869H (Supply) ×2 IMPLANT
TRAY SKIN PREP  DRY (Supply) ×2 IMPLANT
TUBE YANKAUER WITHOUT VENT (Supply) ×2 IMPLANT

## 2020-09-14 NOTE — ED Provider Notes (Signed)
[name]  History and Physical Exam       Patient Name: [name], [name]  Encounter Date:  09/14/2020  Nurse Practitioner: Rodney Langton, FNP  Attending Physician: Malena Peer, *  PCP: Tanja Port, MD  Patient DOB:  September 08, 1965  MRN:  [id number]  Room:  E2/ED2-A      History of Presenting Illness     Chief complaint: Abnormal Lab    HPI/ROS given by: Patient,     [name] is a 55 y.o. male who presents to the ED from the preoperative area here at [facility], with concerns for leukocytosis and impaired renal function.  The patient was scheduled with orthopedics this morning in the OR for surgical repair of a left knee patella fracture.  His initial fall and injury to the knee was on 09/06/20.     The patients last CBC prior to today, 09/10/20, showed WBC 11.6.  Today WBC is 15.9.  Today his GFR is 26, similar to previous trends for the past few months.    Today the patient reports the left knee has become more swollen than in previous days, with redness, warmth, more tenderness.      PMHx includes DM2, HTN, diabetic nephropathy, diabetic polyneuropathy, obesity, Charcot foot, OSA.     Home meds include ASA, Lipitor, gabapentin, insulin, Victoza, amongst others.      -----    Cardiac Echo, 03/16/2015, reporting EF normal at 123456, grade 1 diastolic dysfunction to the left ventricle, no vegetation noted to valves, no significant valvular abnormalities.     08/06/20, A1C 8.4%.          Review of Systems     Review of Systems   Constitutional:  Negative for chills, fatigue and fever.   HENT:  Negative for congestion.    Respiratory:  Negative for cough.    Cardiovascular:  Negative for chest pain.   Gastrointestinal:  Negative for diarrhea and nausea.   Genitourinary:  Negative for dysuria.   Musculoskeletal:  Positive for arthralgias (left knee pain x 1 week, today with redness, warmth, swelling).   Skin:  Positive for wound (left knee abrasion scabbed over).    Neurological:  Negative for dizziness and headaches.      Allergies & Medications     Pt is allergic to influenza virus vaccine.    Current/Home Medications    AMLODIPINE (NORVASC) 5 MG TABLET    TAKE 1 TABLET (5 MG TOTAL) BY MOUTH DAILY.    ASPIRIN EC 81 MG EC TABLET    Take 1 tablet (81 mg total) by mouth every evening.    ATORVASTATIN (LIPITOR) 40 MG TABLET    TAKE 1 TABLET BY MOUTH ONCE DAILY    BLOOD GLUCOSE MONITORING SUPPL (RELION PRIME MONITOR) DEVICE    Use USE TO TEST BLOOD GLUCOSE FOUR TIMES DAILY    CALCIUM CARBONATE (OS-CAL) 600 MG TAB TABLET    Take 600 mg by mouth daily    CONTINUOUS BLOOD GLUC RECEIVER (DEXCOM G6 RECEIVER) DEVICE    1 Device by Does not apply route daily    CONTINUOUS BLOOD GLUC SENSOR (DEXCOM G6 SENSOR) MISC    1 each by Does not apply route every 10 (ten) days ICD 10 code:  E11.65    CONTINUOUS BLOOD GLUC TRANSMIT (DEXCOM G6 TRANSMITTER) MISC    For use with continuous glucose monitoring.  ICD 10 code:  E11.65    GABAPENTIN (NEURONTIN) 300 MG CAPSULE  Take 1 capsule (300 mg total) by mouth 3 (three) times daily    GLUCAGON (BAQSIMI TWO PACK) 3 MG/DOSE POWDER    1 each by Nasal route as needed (severe HYPOglycemia)    GLUCOSE BLOOD TEST STRIP    1 each by Other route 4 (four) times daily Use as instructed    HYDROCODONE-ACETAMINOPHEN (NORCO) 5-325 MG PER TABLET    Take 1 tablet by mouth every 6 (six) hours as needed for Pain No driving or working while taking.  Avoid alcohol while taking    INSULIN GLARGINE (BASAGLAR KWIKPEN) 100 UNIT/ML INJECTION PEN    Inject 68 Units into the skin daily    INSULIN PEN NEEDLE 31G X 6 MM MISC    Use Use up to 7 times a day as directed    LIRAGLUTIDE (VICTOZA) 18 MG/3ML INJECTION    INJECT 1.'8MG'$  SUBCUTANEOUSLY ONCE DAILY    MULTIPLE VITAMIN (MULTIVITAMIN) CAPSULE    Take 1 capsule by mouth daily    NOVOLOG FLEXPEN 100 UNIT/ML INJECTION PEN    Take 6 units before breakfast and lunch, take 10 units before supper, PLUS add correction scale. Max  daily dose 60 units    OXYCODONE-ACETAMINOPHEN (PERCOCET) 5-325 MG PER TABLET    Take 1 tablet by mouth every 4 (four) hours as needed for Pain    VITAMIN D3 (CHOLECALCIFEROL) 50 MCG (2000 UT) TABLET    Take 1 tablet (50 mcg total) by mouth daily        Past Medical History     Pt has a past medical history of Abnormal vision, Arthritis, Complication of anesthesia, Diabetes mellitus, Diabetic ulcer of left foot (12/26/2014), Disc, End-stage renal disease (2021), Gastroesophageal reflux disease, Hyperlipidemia, Hypertension, Low back pain, Neuropathy, Seasonal allergic rhinitis, Sleep apnea, and Type 2 diabetes mellitus, controlled.     Past Surgical History     Pt  has a past surgical history that includes Hand surgery; DEBRIDEMENT & IRRIGATION, LOWER EXTREMITY (Left, 03/12/2015); ARTHRODESIS, TOE (Left, 05/04/2015); DEBRIDEMENT & IRRIGATION, LOWER EXTREMITY (Left, 03/07/2016); SESAMOIDECTOMY (Left, 05/24/2019); and Foot surgery (Right, 05/2020).     Family History     The family history includes COPD in his sister; Cancer in his mother and sister; Depression in his sister; Diabetes in his mother; Hypertension in his brother; Lung cancer in his mother.     Social History     Pt reports that he has been smoking cigarettes. He has been smoking an average of .25 packs per day. He has never used smokeless tobacco. He reports that he does not drink alcohol and does not use drugs.     Physical Exam     Vitals:    09/14/20 1246 09/14/20 1306   BP: 170/80 148/81   Pulse: 100    Resp: 18    Temp: 98.1 F (36.7 C)    TempSrc: Oral    SpO2: 98% 96%   Weight: 112.5 kg    Height: 1.676 m          Physical Exam  Vitals and nursing note reviewed.   Constitutional:       General: He is not in acute distress.     Appearance: Normal appearance. He is obese. He is not ill-appearing or diaphoretic.   HENT:      Head: Normocephalic and atraumatic.      Right Ear: External ear normal.      Left Ear: External ear normal.      Nose: Nose  normal.  Mouth/Throat:      Mouth: Mucous membranes are moist.   Eyes:      Extraocular Movements: Extraocular movements intact.      Pupils: Pupils are equal, round, and reactive to light.   Cardiovascular:      Rate and Rhythm: Normal rate and regular rhythm.      Pulses: Normal pulses.   Pulmonary:      Effort: Pulmonary effort is normal.      Breath sounds: Normal breath sounds.   Abdominal:      General: Abdomen is flat.   Musculoskeletal:         General: Swelling and tenderness present. Normal range of motion.      Cervical back: Normal range of motion.      Comments: Left knee with tenderness, swelling, erythema.  Abrasion to the top scabbed over.    Skin:     General: Skin is warm.      Capillary Refill: Capillary refill takes less than 2 seconds.      Findings: Erythema present.   Neurological:      Mental Status: He is alert and oriented to person, place, and time. Mental status is at baseline.   Psychiatric:         Mood and Affect: Mood normal.         Thought Content: Thought content normal.         Judgment: Judgment normal.                Diagnostic Results     The results of the diagnostic studies below have been reviewed by myself:    Labs  Results       Procedure Component Value Units Date/Time    Urinalysis w Microscopic and Culture if Indicated [id number]  (Abnormal) Collected: 09/14/20 1302    Specimen: Urine, Random Updated: 09/14/20 1319     Color, UA Yellow     Clarity, UA Clear     Urine Specific Gravity 1.020     pH, Urine 6.5 pH      Protein, UR >=300 mg/dL      Glucose, UA 250 mg/dL      Ketones UA Negative mg/dL      Bilirubin, UA Negative mg/dL      Blood, UA Small mg/dL      Nitrite, UA Negative     Urobilinogen, UA 0.2 mg/dL      Leukocyte Esterase, UA Negative Leu/uL      UR Micro Performed     WBC, UA 1-2 /hpf      RBC, UA 1-2 /hpf      Bacteria, UA Rare /hpf      Squam Epithel, UA 1-5 /lpf             Radiologic Studies  XR Chest 2 Views    Result Date: 09/14/2020  Normal  chest. ReadingStation:[id number]    XR Knee 1 Or 2 Views Left    Result Date: 09/14/2020  1.  Slightly progressive erosive changes along the margins of the recent transverse fracture through the lower patellar pole. This is nonspecific and may represent early changes of healing. Superimposed infection not excluded. 2.  Similar degree of distraction measuring approximately 2.6 cm. 3.  Increased overlying soft tissue swelling. [place]          ED Course and Medical Decision Making     ED Medication Orders (From admission, onward)      Start Ordered  Status Ordering Provider    09/14/20 1424 09/14/20 1423  doxycycline (MONODOX) capsule 100 mg  Once in ED        Route: Oral  Ordered Dose: 100 mg     Ordered Shawnee Higham P                This patient presents with a soft tissue infection to the left knee that appears to be appropriate for outpatient treatment with close follow-up.  A serious, rapidly progressive infectious process, bone infection, abscess or necrotizing fasciitis is unlikely based upon the patients presentation.  There is no extensive cellulitic area or lymphangitic spread or signs of sepsis.  Antibiotic treatment has been initiated here and response to treatment will be based on reassessment at close follow-up.  The patient has been instructed about signs and symptoms of acute progression of infection and to return immediately if any of these occur. Cellulitis and soft tissue infection warnings were given.  Patient was well-appearing on serial reevaluation at time of disposition.  Diagnostic impression and plan were discussed with the patient and/or family.  If performed results of lab/radiology tests were discussed with the patient and/or family. All questions were answered and concerns addressed.          In addition to the above history, please see nursing notes. Allergies, meds, past medical, family, social hx, and the results of the diagnostic studies performed have been reviewed  by myself.    I discussed this case with the attending physician in the emergency department and they agree with the assessment and treatment plan.     This chart was generated by an EMR and may contain errors or omissions not intended by the user.     Procedures / Critical Care     None     Diagnosis / Disposition     Clinical Impression  1. Cellulitis of left knee    2. Chronic kidney disease, unspecified CKD stage    3. History of diabetes mellitus    4. Closed displaced fracture of left patella, unspecified fracture morphology, sequela        Disposition  ED Disposition       ED Disposition   Discharge    Condition   --    Date/Time   Mon Sep 14, 2020  2:25 PM    Comment   [name] discharge to home/self care.    Condition at disposition: Stable               Discharge Instructions Include:    Chest xray negative.   Urine dip negative for UTI.   Xray left knee shows increased swelling.   Knee with redness, warmth, start doxycycline antibiotic as directed.   Renal function stable per your previous trends.   Follow up with orthopedics in the coming week to discuss surgery rescheduling.   Continue with knee immobilizer and instructions prior to surgery.    Follow up for Discharged Patients  Tanja Port, MD  [address]  [place] [address]  [phone]    Call in 1 day      Eugenie Filler, [place]  [place] [address]  [phone]    Call in 1 day      [facility] Emergency Department  [place]  [phone]    As needed, If symptoms worsen    Prescriptions for Discharged Patients  New Prescriptions  DOXYCYCLINE (MONODOX) 100 MG CAPSULE    Take 1 capsule (100 mg total) by mouth 2 (two) times daily for 10 days          Signed by:  Rodney Langton, FNP, DNP       Colin Benton, FNP  09/14/20 1426

## 2020-09-14 NOTE — Discharge Instructions (Addendum)
Discharge Instructions Include:    Chest xray negative.   Urine dip negative for UTI.   Xray left knee shows increased swelling.   Knee with redness, warmth, start doxycycline antibiotic as directed.   Renal function stable per your previous trends.   Follow up with orthopedics in the coming week to discuss surgery rescheduling.   Continue with knee immobilizer and instructions prior to surgery.

## 2020-09-14 NOTE — Progress Notes (Signed)
Spoke with Richard in [place] he is telling anes to check this pts labs

## 2020-09-14 NOTE — Interval H&P Note (Signed)
Patient had repeat labs this am due to elevated BUN and creatinine.  He is a diabetic.  This morning his creatinine is still elevated at 2.76 with an elevated BUN at 41.  On repeat check of his CBC patient's white count is also elevated more than it was last week and is now 15.9.  I have discussed the case with my anesthesiologist Dr. Nori Riis.  At this time we decided to cancel the case and reschedule it when he has medical clearance.  The patient's primary care office was called to make an appointment with Dr. Mindi Slicker.  However this doctor is on vacation this week.  Dr.'s partner recommended that the patient be sent to the ER for further evaluation.  I spoke to the emergency room Sherlyn Hay, [place].  I explained the situation.  Patient will be discharged here from [facility] and will be sent to the emergency room at [facility].  Due to medical conditions, the surgical case is canceled.

## 2020-09-14 NOTE — Nursing Progress Note (Signed)
Pt informed of surgery cancellation due to elevated WBC. [facility] was contacted in attempt to schedule pt for same day appt. No appt available until Wednesday, provider recommended pt go to ER for evaluation. Pt informed. He stayed in ASU rm 10 with bilat LE's elevated. Decreased edema noted to left LE. Pt assisted to wc, then wheeled to ER registration. His wife was updated. All belongings sent with pt.

## 2020-09-16 ENCOUNTER — Other Ambulatory Visit (RURAL_HEALTH_CENTER): Payer: Self-pay

## 2020-09-16 DIAGNOSIS — E1165 Type 2 diabetes mellitus with hyperglycemia: Secondary | ICD-10-CM

## 2020-09-16 DIAGNOSIS — Z794 Long term (current) use of insulin: Secondary | ICD-10-CM

## 2020-09-16 MED ORDER — BASAGLAR KWIKPEN 100 UNIT/ML SC SOPN
68.0000 [IU] | PEN_INJECTOR | Freq: Every day | SUBCUTANEOUS | 0 refills | Status: DC
Start: ? — End: 2020-09-16

## 2020-09-16 NOTE — Telephone Encounter (Signed)
The patient is up-to-date on relevant labs.  The patient is due/overdue for follow-up but this is not scheduled. I have contacted the patient to remind or asked the scheduler contact the patient.  I HAVE verified which provider saw the patient last (to send refill request to correct provider).    I HAVE verified the patient's preferred pharmacy, and if applicable, I have included diagnosis code for diabetic meds/supplies.

## 2020-09-18 ENCOUNTER — Other Ambulatory Visit
Admission: RE | Admit: 2020-09-18 | Discharge: 2020-09-18 | Disposition: A | Discharge status: Home / Self Care | Payer: Self-pay | Source: Ambulatory Visit | Attending: Orthopaedic Surgery | Admitting: Orthopaedic Surgery

## 2020-09-19 LAB — VH APTIMA SARS-COV-2 ASSAY (PANTHER SYSTEM)(TM)
Aptima SARS-CoV-2: NEGATIVE
Does patient have symptoms related to condition of interest?: NEGATIVE
Does patient reside in a congregate care setting?: NEGATIVE
Is patient admitted to the intensive care unit?: NEGATIVE
Is patient employed in a healthcare setting?: NEGATIVE
Is the patient hospitalized because of this condition?: NEGATIVE
Is the patient pregnant?: NEGATIVE

## 2020-09-21 ENCOUNTER — Ambulatory Visit: Payer: Self-pay

## 2020-09-21 ENCOUNTER — Encounter: Payer: Self-pay | Admitting: Orthopaedic Surgery

## 2020-09-21 ENCOUNTER — Ambulatory Visit
Admission: RE | Admit: 2020-09-21 | Discharge: 2020-09-21 | Disposition: A | Discharge status: Home / Self Care | Payer: Self-pay | Source: Ambulatory Visit | Attending: Orthopaedic Surgery | Admitting: Orthopaedic Surgery

## 2020-09-21 ENCOUNTER — Ambulatory Visit: Payer: Self-pay | Admitting: Anesthesiology

## 2020-09-21 ENCOUNTER — Encounter
Admission: RE | Disposition: A | Discharge status: Home / Self Care | Payer: Self-pay | Source: Ambulatory Visit | Attending: Orthopaedic Surgery

## 2020-09-21 DIAGNOSIS — Z794 Long term (current) use of insulin: Secondary | ICD-10-CM | POA: Insufficient documentation

## 2020-09-21 DIAGNOSIS — E785 Hyperlipidemia, unspecified: Secondary | ICD-10-CM | POA: Insufficient documentation

## 2020-09-21 DIAGNOSIS — I129 Hypertensive chronic kidney disease with stage 1 through stage 4 chronic kidney disease, or unspecified chronic kidney disease: Secondary | ICD-10-CM | POA: Insufficient documentation

## 2020-09-21 DIAGNOSIS — E1142 Type 2 diabetes mellitus with diabetic polyneuropathy: Secondary | ICD-10-CM | POA: Insufficient documentation

## 2020-09-21 DIAGNOSIS — E1165 Type 2 diabetes mellitus with hyperglycemia: Secondary | ICD-10-CM | POA: Insufficient documentation

## 2020-09-21 DIAGNOSIS — G4730 Sleep apnea, unspecified: Secondary | ICD-10-CM | POA: Insufficient documentation

## 2020-09-21 DIAGNOSIS — N183 Chronic kidney disease, stage 3 unspecified: Secondary | ICD-10-CM | POA: Insufficient documentation

## 2020-09-21 DIAGNOSIS — Z7982 Long term (current) use of aspirin: Secondary | ICD-10-CM | POA: Insufficient documentation

## 2020-09-21 DIAGNOSIS — S82032A Displaced transverse fracture of left patella, initial encounter for closed fracture: Secondary | ICD-10-CM

## 2020-09-21 DIAGNOSIS — Z8781 Personal history of (healed) traumatic fracture: Secondary | ICD-10-CM

## 2020-09-21 DIAGNOSIS — S82002A Unspecified fracture of left patella, initial encounter for closed fracture: Secondary | ICD-10-CM | POA: Diagnosis present

## 2020-09-21 DIAGNOSIS — Z9889 Other specified postprocedural states: Secondary | ICD-10-CM

## 2020-09-21 DIAGNOSIS — F17210 Nicotine dependence, cigarettes, uncomplicated: Secondary | ICD-10-CM | POA: Insufficient documentation

## 2020-09-21 DIAGNOSIS — Z981 Arthrodesis status: Secondary | ICD-10-CM | POA: Insufficient documentation

## 2020-09-21 DIAGNOSIS — J302 Other seasonal allergic rhinitis: Secondary | ICD-10-CM | POA: Insufficient documentation

## 2020-09-21 DIAGNOSIS — S82042A Displaced comminuted fracture of left patella, initial encounter for closed fracture: Secondary | ICD-10-CM | POA: Insufficient documentation

## 2020-09-21 DIAGNOSIS — Z887 Allergy status to serum and vaccine status: Secondary | ICD-10-CM | POA: Insufficient documentation

## 2020-09-21 DIAGNOSIS — H547 Unspecified visual loss: Secondary | ICD-10-CM | POA: Insufficient documentation

## 2020-09-21 DIAGNOSIS — K219 Gastro-esophageal reflux disease without esophagitis: Secondary | ICD-10-CM | POA: Insufficient documentation

## 2020-09-21 DIAGNOSIS — E1122 Type 2 diabetes mellitus with diabetic chronic kidney disease: Secondary | ICD-10-CM | POA: Insufficient documentation

## 2020-09-21 DIAGNOSIS — Z79899 Other long term (current) drug therapy: Secondary | ICD-10-CM | POA: Insufficient documentation

## 2020-09-21 HISTORY — PX: ORIF, PATELLA: SHX4906

## 2020-09-21 HISTORY — DX: Other specified postprocedural states: Z98.890

## 2020-09-21 HISTORY — DX: Personal history of (healed) traumatic fracture: Z87.81

## 2020-09-21 LAB — CBC AND DIFFERENTIAL
Basophils %: 0.7 % (ref 0.0–3.0)
Basophils Absolute: 0.1 10*3/uL (ref 0.0–0.3)
Eosinophils %: 3.7 % (ref 0.0–7.0)
Eosinophils Absolute: 0.5 10*3/uL (ref 0.0–0.8)
Hematocrit: 37.7 % — ABNORMAL LOW (ref 39.0–52.5)
Hemoglobin: 12.5 gm/dL — ABNORMAL LOW (ref 13.0–17.5)
Lymphocytes Absolute: 2.1 10*3/uL (ref 0.6–5.1)
Lymphocytes: 17 % (ref 15.0–46.0)
MCH: 31 pg (ref 28–35)
MCHC: 33 gm/dL (ref 31–36)
MCV: 94 fL (ref 80–100)
MPV: 9 fL (ref 6.0–10.0)
Monocytes Absolute: 0.7 10*3/uL (ref 0.1–1.7)
Monocytes: 5.4 % (ref 3.0–15.0)
Neutrophils %: 73.2 % (ref 42.0–78.0)
Neutrophils Absolute: 9.1 10*3/uL — ABNORMAL HIGH (ref 1.7–8.6)
PLT CT: 266 10*3/uL (ref 130–440)
RBC: 4.03 10*6/uL (ref 4.00–5.70)
RDW: 12.8 % (ref 10.5–14.5)
WBC: 12.5 10*3/uL — ABNORMAL HIGH (ref 4.0–11.0)

## 2020-09-21 LAB — VH DEXTROSE STICK GLUCOSE: Glucose POCT: 174 mg/dL — ABNORMAL HIGH (ref 71–99)

## 2020-09-21 LAB — BASIC METABOLIC PANEL
Anion Gap: 15.1 mMol/L (ref 7.0–18.0)
BUN / Creatinine Ratio: 12.9 Ratio (ref 10.0–30.0)
BUN: 33 mg/dL — ABNORMAL HIGH (ref 7–22)
CO2: 22.2 mMol/L (ref 20.0–30.0)
Calcium: 8.3 mg/dL — ABNORMAL LOW (ref 8.5–10.5)
Chloride: 110 mMol/L (ref 98–110)
Creatinine: 2.55 mg/dL — ABNORMAL HIGH (ref 0.80–1.30)
EGFR: 29 mL/min/{1.73_m2} — ABNORMAL LOW (ref 60–150)
Glucose: 201 mg/dL — ABNORMAL HIGH (ref 71–99)
Osmolality Calculated: 298 mOsm/kg (ref 275–300)
Potassium: 4.3 mMol/L (ref 3.5–5.3)
Sodium: 143 mMol/L (ref 136–147)

## 2020-09-21 SURGERY — ORIF, PATELLA
Anesthesia: Anesthesia General | Site: Knee | Laterality: Left | Wound class: Clean

## 2020-09-21 MED ORDER — POVIDONE-IODINE 5 % OP SOLN
OPHTHALMIC | Status: AC
Start: 2020-09-21 — End: ?
  Filled 2020-09-21: qty 30

## 2020-09-21 MED ORDER — LIDOCAINE HCL (PF) 1 % IJ SOLN
INTRAMUSCULAR | Status: DC | PRN
Start: 2020-09-21 — End: 2020-09-21
  Administered 2020-09-21: 12:00:00 20 mL via INTRAMUSCULAR

## 2020-09-21 MED ORDER — FENTANYL CITRATE (PF) 50 MCG/ML IJ SOLN (WRAP)
INTRAMUSCULAR | Status: AC
Start: 2020-09-21 — End: ?
  Filled 2020-09-21: qty 2

## 2020-09-21 MED ORDER — SUGAMMADEX SODIUM 200 MG/2ML IV SOLN
INTRAVENOUS | Status: AC
Start: 2020-09-21 — End: ?
  Filled 2020-09-21: qty 2

## 2020-09-21 MED ORDER — MIDAZOLAM HCL 1 MG/ML IJ SOLN (WRAP)
INTRAMUSCULAR | Status: AC
Start: 2020-09-21 — End: ?
  Filled 2020-09-21: qty 2

## 2020-09-21 MED ORDER — LIDOCAINE HCL (PF) 1 % IJ SOLN
INTRAMUSCULAR | Status: AC
Start: 2020-09-21 — End: ?
  Filled 2020-09-21: qty 10

## 2020-09-21 MED ORDER — BUPIVACAINE HCL (PF) 0.5 % IJ SOLN
INTRAMUSCULAR | Status: AC
Start: 2020-09-21 — End: ?
  Filled 2020-09-21: qty 30

## 2020-09-21 MED ORDER — SODIUM CHLORIDE 0.9 % IR SOLN
OPHTHALMIC | Status: DC | PRN
Start: 2020-09-21 — End: 2020-09-21
  Administered 2020-09-21: 11:00:00 150 mL

## 2020-09-21 MED ORDER — MIDAZOLAM HCL 1 MG/ML IJ SOLN (WRAP)
INTRAMUSCULAR | Status: DC | PRN
Start: 2020-09-21 — End: 2020-09-21
  Administered 2020-09-21: 2 mg via INTRAVENOUS

## 2020-09-21 MED ORDER — OXYCODONE HCL 5 MG PO TABS
ORAL_TABLET | ORAL | Status: AC
Start: 2020-09-21 — End: ?
  Filled 2020-09-21: qty 1

## 2020-09-21 MED ORDER — ACETAMINOPHEN 10 MG/ML IV SOLN
INTRAVENOUS | Status: AC
Start: 2020-09-21 — End: ?
  Filled 2020-09-21: qty 100

## 2020-09-21 MED ORDER — VH HYDROMORPHONE HCL PF 1 MG/ML CARPUJECT
0.5000 mg | INTRAMUSCULAR | Status: DC | PRN
Start: 2020-09-21 — End: 2020-09-21
  Administered 2020-09-21 (×2): 0.5 mg via INTRAVENOUS

## 2020-09-21 MED ORDER — ONDANSETRON HCL 4 MG/2ML IJ SOLN
INTRAMUSCULAR | Status: AC
Start: 2020-09-21 — End: ?
  Filled 2020-09-21: qty 2

## 2020-09-21 MED ORDER — OXYCODONE HCL 5 MG PO TABS
5.0000 mg | ORAL_TABLET | Freq: Once | ORAL | Status: AC | PRN
Start: 2020-09-21 — End: 2020-09-21
  Administered 2020-09-21: 5 mg via ORAL

## 2020-09-21 MED ORDER — ONDANSETRON HCL 4 MG/2ML IJ SOLN
INTRAMUSCULAR | Status: DC | PRN
Start: 2020-09-21 — End: 2020-09-21
  Administered 2020-09-21 (×2): 4 mg via INTRAVENOUS

## 2020-09-21 MED ORDER — EPHEDRINE SULFATE 50 MG/ML IJ/IV SOLN (WRAP)
Status: AC
Start: 2020-09-21 — End: ?
  Filled 2020-09-21: qty 1

## 2020-09-21 MED ORDER — ROPIVACAINE HCL 5 MG/ML IJ SOLN
INTRAMUSCULAR | Status: AC
Start: 2020-09-21 — End: ?
  Filled 2020-09-21: qty 30

## 2020-09-21 MED ORDER — OXYCODONE-ACETAMINOPHEN 5-325 MG PO TABS
1.0000 | ORAL_TABLET | ORAL | 0 refills | Status: DC | PRN
Start: 2020-09-21 — End: 2020-12-08

## 2020-09-21 MED ORDER — VH HYDROMORPHONE HCL PF 1 MG/ML CARPUJECT
INTRAMUSCULAR | Status: AC
Start: 2020-09-21 — End: ?
  Filled 2020-09-21: qty 1

## 2020-09-21 MED ORDER — FENTANYL CITRATE (PF) 50 MCG/ML IJ SOLN (WRAP)
50.0000 ug | INTRAMUSCULAR | Status: DC | PRN
Start: 2020-09-21 — End: 2020-09-21
  Administered 2020-09-21 (×3): 50 ug via INTRAVENOUS

## 2020-09-21 MED ORDER — PROPOFOL 200 MG/20ML IV EMUL
INTRAVENOUS | Status: AC
Start: 2020-09-21 — End: ?
  Filled 2020-09-21: qty 20

## 2020-09-21 MED ORDER — LACTATED RINGERS IV SOLN
INTRAVENOUS | Status: DC
Start: 2020-09-21 — End: 2020-09-21

## 2020-09-21 MED ORDER — ROCURONIUM BROMIDE 50 MG/5ML IV SOLN
INTRAVENOUS | Status: DC | PRN
Start: 2020-09-21 — End: 2020-09-21
  Administered 2020-09-21: 50 mg via INTRAVENOUS

## 2020-09-21 MED ORDER — PROCHLORPERAZINE EDISYLATE 10 MG/2ML IJ SOLN
5.0000 mg | Freq: Once | INTRAMUSCULAR | Status: DC | PRN
Start: 2020-09-21 — End: 2020-09-21

## 2020-09-21 MED ORDER — ACETAMINOPHEN 10 MG/ML IV SOLN
INTRAVENOUS | Status: DC | PRN
Start: 2020-09-21 — End: 2020-09-21
  Administered 2020-09-21: 1000 mg via INTRAVENOUS

## 2020-09-21 MED ORDER — PROPOFOL 200 MG/20ML IV EMUL
INTRAVENOUS | Status: DC | PRN
Start: 2020-09-21 — End: 2020-09-21
  Administered 2020-09-21: 80 mg via INTRAVENOUS
  Administered 2020-09-21: 200 mg via INTRAVENOUS

## 2020-09-21 MED ORDER — FENTANYL CITRATE (PF) 50 MCG/ML IJ SOLN (WRAP)
INTRAMUSCULAR | Status: DC | PRN
Start: 2020-09-21 — End: 2020-09-21
  Administered 2020-09-21 (×3): 50 ug via INTRAVENOUS
  Administered 2020-09-21: 100 ug via INTRAVENOUS
  Administered 2020-09-21: 50 ug via INTRAVENOUS

## 2020-09-21 MED ORDER — EPHEDRINE SULFATE 50 MG/ML IJ/IV SOLN (WRAP)
Status: DC | PRN
Start: 2020-09-21 — End: 2020-09-21
  Administered 2020-09-21: 25 mg via INTRAVENOUS

## 2020-09-21 MED ORDER — STERILE WATER FOR INJECTION IJ SOLN
INTRAMUSCULAR | Status: DC | PRN
Start: 2020-09-21 — End: 2020-09-21
  Administered 2020-09-21: 10:00:00 2 g via INTRAVENOUS

## 2020-09-21 MED ORDER — CEPHALEXIN 500 MG PO CAPS
500.0000 mg | ORAL_CAPSULE | Freq: Two times a day (BID) | ORAL | 0 refills | Status: AC
Start: 2020-09-21 — End: 2020-09-26

## 2020-09-21 SURGICAL SUPPLY — 33 items
BANDAGE ACE STERILE 6 LF (Dressings) ×2 IMPLANT
BANDAGE ESMARK 6 (Dressings) ×2 IMPLANT
BLADE SCALPEL #10 (Supply) ×8 IMPLANT
CHLORAPREP W/ORANGE TINT 26ML (Supply) ×1 IMPLANT
CUFF TOURNIQUET 34 (Supply) ×1 IMPLANT
DRAPE C-ARM COVER 60 X 42 (Supply) ×2 IMPLANT
DRAPE C-ARMOUR (Supply) ×1 IMPLANT
DRSG AQUACEL AG HYDRO 3.5X14 (Dressings) ×1 IMPLANT
DRSG COMBINE 8 X 7.5 (Dressings) ×5 IMPLANT
DRSG XEROFORM 1 X 8 (Dressings) ×2 IMPLANT
FIBERTAPE 7 (Supply) ×2 IMPLANT
GLOVE BIOGEL UND PI IND SZ 7.5 (Supply) ×1 IMPLANT
GLOVE BIOGEL UT M PI SUR SZ7.5 (Supply) ×2 IMPLANT
GOWN SURG XLG AURORA IMP SLV S (Supply) ×2 IMPLANT
IMMOBILIZER KNEE 24 LG (Supply) ×1 IMPLANT
NDL 1/2 CIR TPR MAYO .050-906 (Supply) ×1 IMPLANT
PACK SMH BASIC ORTHOPEDIC (Supply) ×2 IMPLANT
PADDING CAST 4 X 4 (Supply) ×1 IMPLANT
PADDING CAST 6 X 4 (Supply) ×3 IMPLANT
PASSER HEWSON SUTURE 10.1 (Supply) ×1 IMPLANT
SHEET DRAPE LARGE (Supply) ×4 IMPLANT
SPONGE GAUZE 4 X 4 12 PLY (Dressings) ×2 IMPLANT
SPONGE GAUZE 4 X 4 STERILE (Dressings) ×1 IMPLANT
SPONGE LAP 18 X 18 (Supply) ×3 IMPLANT
STAPLER PROX PLUS WIDE SKIN (Supply) ×1 IMPLANT
STOCKINETTE IMPERVIOUS LGE (Supply) ×2 IMPLANT
SUCTION TUBE FRAZIER 12FR (Supply) ×2 IMPLANT
SUT ETHIBOND EXCEL 2 MX69G (Supply) ×1 IMPLANT
SUT ETHILON 18 2-0 664H (Supply) ×2 IMPLANT
SUT VICRYL 2 J195H (Supply) ×6 IMPLANT
SUT VICRYL COATED 2-0 J869H (Supply) ×4 IMPLANT
TUBE YANKAUER WITHOUT VENT (Supply) ×2 IMPLANT
WIRE KIRSHNER .062 NONTHREAD ×4 IMPLANT

## 2020-09-21 NOTE — Anesthesia Postprocedure Evaluation (Signed)
Anesthesia Post Evaluation    Patient: [name]    Procedure(s):  ORIF, LEFT KNEE PATELLA    Anesthesia type: general    Last Vitals:   Vitals Value Taken Time   BP 167/77 09/21/20 1300   Temp 34.3 C (93.8 F) 09/21/20 1148   Pulse 105 09/21/20 1300   Resp 23 09/21/20 1300   SpO2 95 % 09/21/20 1300                 Anesthesia Post Evaluation:     Patient Evaluated: bedside  Patient Participation: complete - patient participated  Level of Consciousness: awake and alert  Pain Score: 3  Pain Management: adequate  Multimodal analgesia pain management approach    Airway Patency: patent    Anesthetic complications: No      PONV Status: none    Cardiovascular status: acceptable  Respiratory status: acceptable  Hydration status: acceptable        Signed by: Jilda Panda, MD, 09/21/2020 2:28 PM

## 2020-09-21 NOTE — Discharge Instr - AVS First Page (Addendum)
[facility]  [address] [place]  [place], [place] [address]  [phone]    Evon Slack, MD          Home Instructions after Surgery     1. Resume your usual diet at home.   2. Ambulation weightbearing as tolerated on your left leg.  Use a walker for mobility.  3. Keep dressing/bandage clean and dry. You may change the dressing and shower on postop day #3.  Reapply and keep her brace on at all times  4.  Take calcium with vitamin D twice a day and extra vitamin D for bone healing  5. Take Aspirin 81 mg every morning and night to prevent blood clots, until you have resumed your normal activities.  6. Swelling after surgery can occur and is normal. Remember to elevate the left leg.  7.  You may also apply ice packs over the bandage to help with pain and decrease swelling.   8. You may take Tylenol 2 tablets every 6 hours to help with pain and to decrease swelling, in between narcotic medication.   9. Do not drive or operate machinery while taking narcotic pain medications after surgery.   10 Do not soak your left leg in a bathtub, hot tub,  or pool. Do not apply lotions, ointments, Neosporin to the wound. .   11 Constipation can develop while taking narcotic pain medicines. You can take MiraLAX  twice a day for relief as needed..    1 2 drink plenty of fluids.      Please call our office with any questions or concerns [phone]. If our office is closed you may call [facility] at [phone] and ask for the Orthopedic Physician on call.  We thank you for choosing our team to provide surgical care for you.   [facility]  Discharge Instructions    A.  To have a safe recovery from your operation, for the next 24 hours.   1.  Do not drink alcoholic beverages.   2.  Do not make important personal or business decisions.   3.  Do not drive a vehicle or  operate hazardous machinery.   4.  Do not become overheated or too cold.    B.  Medications:  See attached medication form.    C.  Diet:   1.  Initially start with light foods and then progress to your normal diet.   2.  Drink plenty of fluids such as water, tea and fruit juices.    D.  Educational Handouts:   1.  57 Instruction sheet.   2.  Medication    E.  Contact your surgeon if you have any questions or if any of the following occur:   1.  Your bandages become soaked with bright red blood (do not remove the original bandages).   2.  Excessive swelling around the wound/incision.   3.  Chills and fever above 101F.   4.  Excessive pain.   5.  Excessive drainage.   6.  Continued nausea or vomiting.      F.  If you had spinal anesthesia and are unable to urinate within 8 hours after discharge or at any time experience lower abdominal pain and are unable to urinate go to the Emergency Room.    G.  Additional Remarks Appointment One week for dressing change will make follow up appointment at that time    H.  Follow  up appointment with Dr. Gerilyn Nestle as scheduled    These instructions have been reviewed with the patient and responsible adult.    Telephone numbers:   [facility]   [phone]) [phone]   [facility]  [phone]   Emergency Department  [phone]

## 2020-09-21 NOTE — Op Note (Signed)
Date: 09/21/2020  PREOPERATIVE DIAGNOSIS: Comminuted fracture, displaced fracture  distal 3rd left patella.    POSTOPERATIVE DIAGNOSIS: Comminuted fracture, displaced fracture  distal 3rd left patella.    PROCEDURE: Open reduction and internal fixation, comminuted  fracture patella left knee.    SURGEON: Eugenie Filler, M.D.    FIRST ASSISTANT: Serafina Royals, RN.    ANESTHESIA: General via endotracheal tube by Dr. Herold Harms.    PROCEDURE IN DETAIL: The patient was brought to the operating  room, ASU.  I signed my site.  IV antibiotics of Ancef was given  preoperatively.  The patient was brought to the operating room,  placed supine on the operating room table.  General anesthesia  was administered via endotracheal tube.  A tourniquet was placed  on the left upper thigh.  The left lower extremity was prepped  with ChloraPrep.  The patient was draped in sterile fashion.  Time-out was called.  The leg was elevated exsanguinated with an  Esmarch, and the tourniquet inflated to 300 torr.  With a  scalpel, a midline incision was made on the anterior aspect of  the left knee.  Sharp dissection was carried down through the  skin and subcutaneous tissue.  The fracture was encountered.  The wound was irrigated and suctioned of the fracture hematoma.  Using towel clips the fracture was reduced.  The knee was placed  in full extension.  Two parallel K-wires were then passed using  a 0.062 K-wires nonthreaded, passed one from superior to  inferior and one from inferior to superior through the patella.  Position was checked to be sure it was not in the joint.  Then I  used a Varees needle to pass FiberTape.  Under the patellar  tendon we passed two of them to make sure that the repair was  strong enough to create a figure-of-eight, passed another Varees  needle under the patella tendon as well as under the wires and  passed the FiberTape under that bringing the knot out to the  lateral side.  The knee was put in full  extension.  The  positions were checked by fluoroscopy and the FiberTape was tied  down in several knots to be sure that it was cinched down as  tight as possible.  The K-wires were bent and turned into the  soft tissues.  The wound was irrigated with normal saline mixed  with Betadine and then irrigated out.  The extensor retinaculum  was repaired with #2 Vicryl figure-of-eight interrupted suture  and #2 Ethibond interrupted suture.  The subcutaneous tissue was  closed with 2-0 Vicryl interrupted suture and 0-Vicryl  interrupted suture to the skin.  Superficial subcutaneous tissue  was closed with 2-0 Vicryl interrupted suture.  The skin was  closed with staples.  The knee was injected with 1% lidocaine  plain mixed with 0.25% Marcaine for postop analgesia.  This was  also injected into the joint.  Sterile dressings of Xeroform,  4x4s, ABD, sterile Webril, and Ace wrap was applied to the left  knee.  The tourniquet was released for a total tourniquet time  of 90 minutes.  A brace was applied to the left knee, locked in  extension.  The patient was awakened in the operating room,  extubated, transferred to the litter, and sent to the recovery  room in good condition.    ESTIMATED BLOOD LOSS: 25 mL.    TOTAL TOURNIQUET TIME: 90 minutes at 300 torr.    COMPLICATIONS: There were  no complications.        [id number]  DD: 09/21/2020 12:02:26  DT: 09/21/2020 12:47:51  JOB: [id number]

## 2020-09-21 NOTE — Interval H&P Note (Signed)
I have reviewed the H&P, examined the patient and there are no changes other than  Labs are improved.

## 2020-09-21 NOTE — Brief Op Note (Signed)
BRIEF OP NOTE    Date Time: 09/21/20 11:57 AM    Patient Name:   [name]    Date of Operation:   09/21/2020    Providers Performing:   Surgeon(s):  Eugenie Filler, MD    Assistant (s):   Circulator: Letitia Caul, RN  Radiology Technologist: Kathie Dike  Scrub Person: Arehart, Myra Gianotti, RN    Operative Procedure:   Procedure(s):  ORIF, LEFT KNEE PATELLA    Preoperative Diagnosis:   Pre-Op Diagnosis Codes:     * Closed displaced transverse fracture of left patella, initial encounter [S82.032A]    Postoperative Diagnosis:   Same    Anesthesia:   General via ETT    Estimated Blood Loss:   25 ml     Fluids:       TT:     Total Tourniquet Time Documented:  Thigh (Left) - 90 minutes  Total: Thigh (Left) - 90 minutes      Implants:     Implant Name Type Inv. Item Serial No. Manufacturer Lot No. LRB No. Used Action   Andria Frames .062 [employer] [id number]  [place]  [id number]  Left 2 Implanted       Drains:   None    Specimens:     * No specimens in log *    Findings:       Complications:   None      Signed by: Eugenie Filler, MD                                                                           [facility] MAIN OR

## 2020-09-21 NOTE — Anesthesia Preprocedure Evaluation (Addendum)
Anesthesia Evaluation    AIRWAY    Mallampati: II    TM distance: >3 FB  Neck ROM: full  Mouth Opening:full  Planned to use difficult airway equipment: No CARDIOVASCULAR    cardiovascular exam normal       DENTAL         PULMONARY    pulmonary exam normal     OTHER FINDINGS    Bad teeth, some of them is missing or loose.           CONCLUSIONS    The left ventricular ejection fraction is normal, estimated at 60%. There is grade I diastolic dysfunction of the left ventricle     (impaired relaxation pattern).    No vegetations noted on any of the cardiac valves.    No significant valvular abnormalities.       There is no prior study for comparison.              Corbin Ade MD     (Electronically Signed)     Final Date:16 March 2015 15:37     Relevant Problems   NEURO/PSYCH   (+) History of kidney injury      CARDIO   (+) Essential hypertension      GU/RENAL   (+) CKD (chronic kidney disease)   (+) Diabetic nephropathy      ENDO   (+) Type 2 diabetes mellitus with hyperglycemia, with long-term current use of insulin       PSS Anesthesia Comments: Denies CP/SOB. Denies GERD symptoms. Patient refuses spinal anesthesia.        Anesthesia Plan    ASA 3     general                     intravenous induction   Detailed anesthesia plan: general endotracheal        Post op pain management: per surgeon    informed consent obtained    ECG reviewed  pertinent labs reviewed             Signed by: Jilda Panda, MD 09/21/20 9:12 AM

## 2020-09-21 NOTE — Transfer of Care (Signed)
Anesthesia Transfer of Care Note    Patient: [name]    Last vitals:   Vitals:    09/21/20 1240   BP: 152/77   Pulse: 97   Resp: 14   Temp:    SpO2: 97%       Oxygen: Room Air     Mental Status:sedated    Airway: Natural    Cardiovascular Status:  stable

## 2020-09-23 ENCOUNTER — Other Ambulatory Visit (RURAL_HEALTH_CENTER): Payer: Self-pay

## 2020-09-23 DIAGNOSIS — Z9889 Other specified postprocedural states: Secondary | ICD-10-CM

## 2020-09-23 DIAGNOSIS — Z8781 Personal history of (healed) traumatic fracture: Secondary | ICD-10-CM

## 2020-09-23 MED ORDER — ONDANSETRON HCL 4 MG PO TABS
4.0000 mg | ORAL_TABLET | Freq: Four times a day (QID) | ORAL | 0 refills | Status: DC | PRN
Start: 2020-09-23 — End: 2021-04-12

## 2020-09-24 ENCOUNTER — Encounter: Payer: Self-pay | Admitting: Orthopaedic Surgery

## 2020-09-29 ENCOUNTER — Ambulatory Visit: Payer: Self-pay | Attending: Orthopaedic Surgery | Admitting: Orthopaedic Surgery

## 2020-09-29 ENCOUNTER — Encounter (RURAL_HEALTH_CENTER): Payer: Self-pay | Admitting: Orthopaedic Surgery

## 2020-09-29 DIAGNOSIS — Z9889 Other specified postprocedural states: Secondary | ICD-10-CM

## 2020-09-29 DIAGNOSIS — Z8781 Personal history of (healed) traumatic fracture: Secondary | ICD-10-CM

## 2020-09-29 MED ORDER — HYDROCODONE-ACETAMINOPHEN 5-325 MG PO TABS
1.0000 | ORAL_TABLET | Freq: Four times a day (QID) | ORAL | 0 refills | Status: DC | PRN
Start: 2020-09-29 — End: 2020-12-24

## 2020-09-29 NOTE — Patient Instructions (Signed)
Please make sure you follow up with the prescribing physicians for any medications that you have elected to no longer take as prescribed.  PRESCRIPTION MEDICATION GUIDELINES     This practice treats injury and disease by surgery, casting, or supervised physical therapy.  Prescription medications only supplement these aspects of your care.                1.  Medications are only to be used by the person for whom they are prescribed and only for the purpose for which they are prescribed.     2.  All medications should be used only in the doses prescribed and no more frequently than the prescribed schedule.  Early refills will not be given if you are not taking medication as prescribed.     3.  Lost or stolen prescriptions or pills will not be replaced, so keep your medication in a safe and secure location.     4.  Prescriptions are designed to last until your next scheduled appointment.  Confirm any planned trips or other scheduling considerations before leaving the examination room so that the correct number of doses can be prescribed.  Prescriptions are only provided on scheduled appointments.     5.  You are required to inform every doctor that you see about all medications prescribed by any other doctor, especially narcotics.  If you are receiving narcotics of any kind from this practice, you should not be receiving them from any other provider.     6.  If, after a brief trial of medication, we determine that your problem is best managed by medicine alone, you will be asked to secure long term medical management from your medical physician.  This is a surgical practice.  Narcotics will be given for short term use only.  NO EXCEPTIONS.     7.  Failure to give these medications their proper respect and abide by these principles will result in the complete discontinuation of prescription medications for you from this practice.     8.  All narcotic use is monitored through the [place] Prescription Monitoring  Program.     9.  Phone, fax, or email prescriptions will not be offered in place of scheduled appointments.  We do not respond to requests for medication by phone, fax, or email.     Failure to comply with the above terms can or will result in dismissal from the practice.     Chronic pain is best managed and coordinated by primary care providers or a pain specialist.     [place] State Law only allows a short course of pain medication.  Generally, most patients require no more than a seven day supply.     Drug Use/Abuse Resources     For more information on drug treatment services in your area, call 2-1-1 or visit www.211.org and enter your zip code.     Concern Hotline offers local crisis intervention and referral.  Calls are answered 24/7.                 [phone]  ([place], [place])              [phone]  ([place])              [phone]  ([place])              [phone]  ([place]     For suggestions on safe drug storage, visit www.fda.gov/lockitup.       For drug information geared to teens, parents, and health professionals, visit www.drugabuse.gov.     If you were given a referral today, all office notes and insurance information will be sent from us to the office you are being referred.  Please give the office you are being sent to 5-7 business days to get in touch with you to schedule the appointment.   If after 5-7 business days, you have not heard from the office you were referred, please contact them directly.   If the appointment is still not made after 2 weeks, please do not hesitate to contact us for assistance.

## 2020-09-29 NOTE — Progress Notes (Signed)
Progress Note     Date Time: 09/29/2020 4:07 PM  Patient Name: [name], [name]  DOB: 08-15-1965  Age: 55 y.o.   PCP: Tanja Port, MD      Chief Complaint:     Chief Complaint   Patient presents with    Post-op     ORIF left Patella f/u DOS:09/21/20       HPI: This is a 55 year old who presents in follow-up status post ORIF of the patella fracture left knee.  Date of surgery was 09/21/2020.  He is here for clinical wound check.      Objective:     VITALS SIGNS:  There were no vitals taken for this visit. Pain Score:0-No pain    PHYSICAL EXAM: On exam of the left knee the surgical wounds healing nicely.  He has mild swelling.  No signs of infection or drainage.  Distally he is neurovascular intact.  New dressings were applied.      ASSESSMENT:   S/p ORIF Patella Fracture Left Knee with Fiber Tape  DOS 09/21/2020      PLAN: Patient may ambulate weightbearing as tolerated.  He needs to keep his knee straight at all times.  He is to use the knee immobilizer at all times.  He may remove the knee immobilizer for showering.  A refill of Norco was sent to his [facility] in [place].  Follow-up in 2 weeks to include x-rays and for removal of staples.      Eugenie Filler, MD  09/29/2020  4:07 PM  This note was generated by the speech recognition software and may contain inherent errors or omissions not intended by the user. Grammatical errors, random word insertions, deletions, pronoun errors and incomplete sentences are occasional consequences of this technology due to software limitations. Not all errors are caught or corrected. If there are questions or concerns about the content of this note or information contained within the body of this dictation they should be addressed directly with the author for clarification.

## 2020-10-22 ENCOUNTER — Ambulatory Visit (RURAL_HEALTH_CENTER): Payer: Self-pay

## 2020-10-22 ENCOUNTER — Encounter (RURAL_HEALTH_CENTER): Payer: Self-pay | Admitting: Orthopaedic Surgery

## 2020-10-22 ENCOUNTER — Ambulatory Visit: Payer: Self-pay | Attending: Orthopaedic Surgery | Admitting: Orthopaedic Surgery

## 2020-10-22 VITALS — Ht 66.0 in

## 2020-10-22 DIAGNOSIS — S82032A Displaced transverse fracture of left patella, initial encounter for closed fracture: Secondary | ICD-10-CM

## 2020-10-22 DIAGNOSIS — Z9889 Other specified postprocedural states: Secondary | ICD-10-CM

## 2020-10-22 DIAGNOSIS — Z8781 Personal history of (healed) traumatic fracture: Secondary | ICD-10-CM

## 2020-10-22 NOTE — Patient Instructions (Signed)
Please make sure you follow up with the prescribing physicians for any medications that you have elected to no longer take as prescribed.  PRESCRIPTION MEDICATION GUIDELINES     This practice treats injury and disease by surgery, casting, or supervised physical therapy.  Prescription medications only supplement these aspects of your care.                1.  Medications are only to be used by the person for whom they are prescribed and only for the purpose for which they are prescribed.     2.  All medications should be used only in the doses prescribed and no more frequently than the prescribed schedule.  Early refills will not be given if you are not taking medication as prescribed.     3.  Lost or stolen prescriptions or pills will not be replaced, so keep your medication in a safe and secure location.     4.  Prescriptions are designed to last until your next scheduled appointment.  Confirm any planned trips or other scheduling considerations before leaving the examination room so that the correct number of doses can be prescribed.  Prescriptions are only provided on scheduled appointments.     5.  You are required to inform every doctor that you see about all medications prescribed by any other doctor, especially narcotics.  If you are receiving narcotics of any kind from this practice, you should not be receiving them from any other provider.     6.  If, after a brief trial of medication, we determine that your problem is best managed by medicine alone, you will be asked to secure long term medical management from your medical physician.  This is a surgical practice.  Narcotics will be given for short term use only.  NO EXCEPTIONS.     7.  Failure to give these medications their proper respect and abide by these principles will result in the complete discontinuation of prescription medications for you from this practice.     8.  All narcotic use is monitored through the [place] Prescription Monitoring  Program.     9.  Phone, fax, or email prescriptions will not be offered in place of scheduled appointments.  We do not respond to requests for medication by phone, fax, or email.     Failure to comply with the above terms can or will result in dismissal from the practice.     Chronic pain is best managed and coordinated by primary care providers or a pain specialist.      State Law only allows a short course of pain medication.  Generally, most patients require no more than a seven day supply.     Drug Use/Abuse Resources     For more information on drug treatment services in your area, call 2-1-1 or visit www.211.org and enter your zip code.     Concern Hotline offers local crisis intervention and referral.  Calls are answered 24/7.                 [phone]  ([place], [place])              [phone]  ([place])              [phone]  ([place])              [phone]  ([place]     For suggestions on safe drug storage, visit www.fda.gov/lockitup.       For drug information geared to teens, parents, and health professionals, visit www.drugabuse.gov.     If you were given a referral today, all office notes and insurance information will be sent from us to the office you are being referred.  Please give the office you are being sent to 5-7 business days to get in touch with you to schedule the appointment.   If after 5-7 business days, you have not heard from the office you were referred, please contact them directly.   If the appointment is still not made after 2 weeks, please do not hesitate to contact us for assistance.    SMOKING CESSATION     FACTS ABOUT SMOKING (HURTING YOURSELF)  Smoking is an addiction. Tobacco smoke contains nicotine, a drug that is addictive and can make it very hard, but not impossible, to quit.    More than 400,000 deaths in the [place] each year are from smoking related illnesses. Smoking greatly increases your risks for lung cancer and many  other cancers.  Smoking delays bone and tendon healing.  Smoking increases the risk of nonunion (complete failure to heal a broken bone).    HURTING OTHERS  Smoking harms not just the smoker, but also family members, coworkers and others who breathe the smoker’s cigarette smoke, called secondhand smoke.  Among infants to 18 months of age, secondhand smoke is associated with as many as 300,000 cases of bronchitis and pneumonia each year.  Secondhand smoke from parent’s cigarette increase a child’s chances for middle ear problems, causes coughing and wheezing, and worsens asthma conditions.  If both parents smoke, a teenager is more than twice as likely to smoke as a young person whose parents are both non-smokers. In households where only one parent smokes, young people are also more likely to start smoking.   Pregnant women who smoke are more likely to deliver babies whose weights are too low for the babies’ good health. If all women would quit smoking during pregnancy, about 4,000 new babies would not die each year.     WHY QUIT  Quitting smoking makes a difference right away - you can taste and smell food better. Your breath smells better. Your cough goes away.  This happens for men and women of all ages, even those who are older.  It happens for healthy people as well as those who already have a disease or condition caused by smoking.  Quitting smoking cuts the risk of lung cancer, many other cancers, heart disease, stroke, other lung diseases, and other respiratory illnesses.    Ex-smokers have better health than current smokers.  Ex-smokers have fewer days of illness, fewer health complaints, and less bronchitis and pneumonia than current smokers.  Quitting smoking saves money.  A pack-a-day smoker, who pays $5 per pack, can expect to save more than $1800 per year. It appears that the price of cigarettes will continue to rise in coming years, as will the financial rewards of quitting the habit.    QUITTING  TIPS/GETTING READY TO QUIT  Set a date for quitting and tell friends, family, and co-workers.  If possible, have a friend quit smoking with you.  Keep a smoker’s log.  Notice when and why you smoke.  Try to find the things in your daily life that you often do while smoking (such as drinking your morning cup of coffee or driving a car).  Visualize yourself doing those things without cigarettes.

## 2020-10-22 NOTE — Progress Notes (Signed)
Progress Note     Date Time: 10/22/2020 4:14 PM  Patient Name: [name], [name]  DOB: 05-Mar-1965  Age: 55 y.o.   PCP: Tanja Port, MD      Chief Complaint:     Chief Complaint   Patient presents with    Knee Pain     P/O ORIF Lt patella DOS 09/21/20       HPI: This is a 55 year old male who presents in follow-up status post ORIF of a comminuted patella fracture left knee.  Date of surgery 09/21/20.  He is here for clinical check and follow-up.  He says it just takes a little bit.  He has been using his knee immobilizer.  He presents in a wheelchair.      Objective:     VITALS SIGNS:  Ht 1.676 m ('5\' 6"'$ )    BMI 40.03 kg/m  Pain Score:2-mild pain    PHYSICAL EXAM: On exam of the left knee the swelling is gone down.  Wound is healed.  Stitches and staples were removed by Lilia Argue, medical assistant.  Distally he is neurovascular intact.  Homans test is negative.      IMAGING: XR Knee 1 Or 2 Views Left  X-ray indications: Follow-up patella fracture    X-rays of the left knee AP and lateral were taken in the orthopedic clinic   today on 10/22/2020.  These x-rays demonstrate retained hardware in the   patella.  The alignment is satisfactory.  However there has been some   recurrent gapping between the fracture fragment of the distal pole of the   patella.  It is mild displacement.    I reviewed the set of x-rays myself and with the patient.    Assessment: Interval ORIF Patella Fracture Left Knee with Placement of K   Wires.        ASSESSMENT:   S/p ORIF Patella Fracture Left Knee  DOS 09/21/2020      PLAN: Patient may ambulate weightbearing as tolerated.  He is to use his knee immobilizer at all times.  We will see him back in follow-up in 6 weeks for repeat clinical check and x-rays.  He is to take his calcium with vitamin D and extra vitamin D for bone healing.  He is to use vitamin C I also for soft tissue healing.      Eugenie Filler, MD  10/22/2020  4:14 PM  This note was generated by the speech  recognition software and may contain inherent errors or omissions not intended by the user. Grammatical errors, random word insertions, deletions, pronoun errors and incomplete sentences are occasional consequences of this technology due to software limitations. Not all errors are caught or corrected. If there are questions or concerns about the content of this note or information contained within the body of this dictation they should be addressed directly with the author for clarification.

## 2020-11-05 ENCOUNTER — Other Ambulatory Visit (RURAL_HEALTH_CENTER): Payer: Self-pay

## 2020-11-05 MED ORDER — FREESTYLE LIBRE 14 DAY SENSOR MISC
1.0000 | 1 refills | Status: DC
Start: ? — End: 2020-11-05

## 2020-11-05 NOTE — Telephone Encounter (Addendum)
Pt does not have insurance right now and [name] is cheaper just needs sensors. He has no way of checking his sugar

## 2020-11-12 ENCOUNTER — Telehealth (RURAL_HEALTH_CENTER): Payer: Self-pay

## 2020-11-12 NOTE — Telephone Encounter (Signed)
Patient called in an effort to move up his rescheduled follow up appt for 11/29. Pt declined moving and wants to keep his current appt.

## 2020-12-03 ENCOUNTER — Ambulatory Visit (RURAL_HEALTH_CENTER): Payer: Self-pay

## 2020-12-08 ENCOUNTER — Ambulatory Visit (RURAL_HEALTH_CENTER): Payer: Self-pay

## 2020-12-08 ENCOUNTER — Ambulatory Visit: Payer: Self-pay | Attending: Orthopaedic Surgery | Admitting: Orthopaedic Surgery

## 2020-12-08 ENCOUNTER — Encounter (RURAL_HEALTH_CENTER): Payer: Self-pay | Admitting: Orthopaedic Surgery

## 2020-12-08 VITALS — Ht 66.0 in

## 2020-12-08 DIAGNOSIS — S82002A Unspecified fracture of left patella, initial encounter for closed fracture: Secondary | ICD-10-CM

## 2020-12-08 DIAGNOSIS — S82032A Displaced transverse fracture of left patella, initial encounter for closed fracture: Secondary | ICD-10-CM

## 2020-12-08 MED ORDER — OXYCODONE-ACETAMINOPHEN 5-325 MG PO TABS
1.0000 | ORAL_TABLET | Freq: Four times a day (QID) | ORAL | 0 refills | Status: DC | PRN
Start: 2020-12-08 — End: 2020-12-24

## 2020-12-08 NOTE — Progress Notes (Signed)
[facility] testing     Progress Note     Date Time: 12/08/2020 4:07 PM  Patient Name: [name], [name]  DOB: 1965-03-17  Age: 55 y.o.   PCP: Tanja Port, MD      Chief Complaint:     Chief Complaint   Patient presents with    Knee Pain     P/O L patella ORIF DOS 09/21/20--10/21 and 10/23--popped       HPI: This is a 55 year old male who presents in follow-up status post ORIF patella fracture left knee.  Date of surgery was 09/21/2020.  Patient said the knee popped 4 days ago and popped again 2 days ago.  He developed severe swelling and inability to straight leg raise.  He has been wearing his knee immobilizer.  He presents with swelling and pain of the left knee.      Objective:     VITALS SIGNS:  Ht 1.676 m (5\' 6" )    BMI 40.03 kg/m  Pain Score:8-severe pain    PHYSICAL EXAM: On exam of the left knee he has a moderate effusion.  He has a defect at the patella fracture site.  The superior pole of the patella can be palpated superior and the inferior pole can be palpated displaced inferiorly.  Distally he is neurovascular intact.      IMAGING: XR Knee 1 Or 2 Views Left  X-ray indications: Evaluate patella fracture left knee    X-rays of the left knee AP and lateral were taken in the orthopedic clinic   today on 12/08/2020.  These x-rays demonstrate patent wires present and a   now widely displaced fracture of the patella.  There is a transverse   fracture that has really displaced.  Joint effusion is noted.    I reviewed the set of x-rays myself and with the patient.    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  I will work on arranging repeat ORIF for this patient.  I discussed the possibility of repeat surgery either at [facility] or at [facility].  We will call him with further recommendations.  Pain  medication of Percocet was sent to his pharmacy.  He will continue to use knee immobilizer and walker for mobility.  He is to use lots of ice and elevation to help the swelling go down.      Eugenie Filler, MD  12/08/2020  4:07 PM  This note was generated by the speech recognition software and may contain inherent errors or omissions not intended by the user. Grammatical errors, random word insertions, deletions, pronoun errors and incomplete sentences are occasional consequences of this technology due to software limitations. Not all errors are caught or corrected. If there are questions or concerns about the content of this note or information contained within the body of this dictation they should be addressed directly with the author for clarification.

## 2020-12-08 NOTE — Patient Instructions (Signed)
Please make sure you follow up with the prescribing physicians for any medications that you have elected to no longer take as prescribed.  PRESCRIPTION MEDICATION GUIDELINES     This practice treats injury and disease by surgery, casting, or supervised physical therapy.  Prescription medications only supplement these aspects of your care.                1.  Medications are only to be used by the person for whom they are prescribed and only for the purpose for which they are prescribed.     2.  All medications should be used only in the doses prescribed and no more frequently than the prescribed schedule.  Early refills will not be given if you are not taking medication as prescribed.     3.  Lost or stolen prescriptions or pills will not be replaced, so keep your medication in a safe and secure location.     4.  Prescriptions are designed to last until your next scheduled appointment.  Confirm any planned trips or other scheduling considerations before leaving the examination room so that the correct number of doses can be prescribed.  Prescriptions are only provided on scheduled appointments.     5.  You are required to inform every doctor that you see about all medications prescribed by any other doctor, especially narcotics.  If you are receiving narcotics of any kind from this practice, you should not be receiving them from any other provider.     6.  If, after a brief trial of medication, we determine that your problem is best managed by medicine alone, you will be asked to secure long term medical management from your medical physician.  This is a surgical practice.  Narcotics will be given for short term use only.  NO EXCEPTIONS.     7.  Failure to give these medications their proper respect and abide by these principles will result in the complete discontinuation of prescription medications for you from this practice.     8.  All narcotic use is monitored through the [place] Prescription Monitoring  Program.     9.  Phone, fax, or email prescriptions will not be offered in place of scheduled appointments.  We do not respond to requests for medication by phone, fax, or email.     Failure to comply with the above terms can or will result in dismissal from the practice.     Chronic pain is best managed and coordinated by primary care providers or a pain specialist.     [place] State Law only allows a short course of pain medication.  Generally, most patients require no more than a seven day supply.     Drug Use/Abuse Resources     For more information on drug treatment services in your area, call 2-1-1 or visit www.211.org and enter your zip code.     Concern Hotline offers local crisis intervention and referral.  Calls are answered 24/7.                 [phone]  ([place], [place])              [phone]  ([place])              [phone]  ([place])              [phone]  ([place]     For suggestions on safe drug storage, visit www.fda.gov/lockitup.       For drug information geared to teens, parents, and health professionals, visit www.drugabuse.gov.     If you were given a referral today, all office notes and insurance information will be sent from us to the office you are being referred.  Please give the office you are being sent to 5-7 business days to get in touch with you to schedule the appointment.   If after 5-7 business days, you have not heard from the office you were referred, please contact them directly.   If the appointment is still not made after 2 weeks, please do not hesitate to contact us for assistance.

## 2020-12-09 ENCOUNTER — Telehealth (INDEPENDENT_AMBULATORY_CARE_PROVIDER_SITE_OTHER): Payer: Self-pay | Admitting: Orthopaedic Surgery

## 2020-12-09 NOTE — Telephone Encounter (Signed)
Pt referred by Dr. Gerilyn Nestle, s/p ORIF patella fx, pt felt a "pop", HW disruption.   Please review & show to [facility].

## 2020-12-09 NOTE — Telephone Encounter (Signed)
Will have [facility] review. Plan to be able to just call patient to get scheduled for surgery.

## 2020-12-14 NOTE — Telephone Encounter (Signed)
Discussed operative intervention for patella. Plan to proceed with ORIF on 12/24/20. He wishes to call back once his wife is home to get all the info. He will need all normal pre-op info (COVID, NPO, amb surgery number, etc) Thanks.

## 2020-12-14 NOTE — Telephone Encounter (Signed)
Reviewed with Dr. Owens Shark. Plan to schedule patient for revision next week. I will reach out to the patient once I have time later today. Thanks.

## 2020-12-15 ENCOUNTER — Encounter (INDEPENDENT_AMBULATORY_CARE_PROVIDER_SITE_OTHER): Payer: Self-pay

## 2020-12-15 NOTE — Telephone Encounter (Signed)
Pt called back stated 11/10 was good for him. Please call back with pre-op info. ce

## 2020-12-15 NOTE — Telephone Encounter (Signed)
Spoke with patient and gave information for surgery. Order for COVID sent to [facility] that he will get done on 11/07  He verbalized understanding of all information    Case Posted to OR for 12/24/20  COVID order faxed to [facility] at [phone]

## 2020-12-15 NOTE — Telephone Encounter (Signed)
Please post for:     - LEFT PATELLA HARDWARE REVISION/ORIF on 12/24/20 (CPT: 27520, 20680 ---- ICD 10:  S82.002A, T84)  - COVID order sent to testing site of pt preference    Please review: NPO, COVID testing, amb surgery number, am admissions    Thanks

## 2020-12-21 ENCOUNTER — Other Ambulatory Visit
Admission: RE | Admit: 2020-12-21 | Discharge: 2020-12-21 | Disposition: A | Discharge status: Home / Self Care | Payer: Self-pay | Source: Ambulatory Visit | Attending: Medical | Admitting: Medical

## 2020-12-22 LAB — VH APTIMA SARS-COV-2 ASSAY (PANTHER SYSTEM)(TM)
Aptima SARS-CoV-2: NEGATIVE
Does patient have symptoms related to condition of interest?: NEGATIVE
Does patient reside in a congregate care setting?: NEGATIVE
Is patient admitted to the intensive care unit?: NEGATIVE
Is patient employed in a healthcare setting?: NEGATIVE
Is the patient hospitalized because of this condition?: NEGATIVE
Is the patient pregnant?: NEGATIVE

## 2020-12-24 ENCOUNTER — Ambulatory Visit
Admission: RE | Admit: 2020-12-24 | Discharge: 2020-12-24 | Disposition: A | Discharge status: Home / Self Care | Payer: Self-pay | Source: Ambulatory Visit | Attending: Orthopaedic Surgery | Admitting: Orthopaedic Surgery

## 2020-12-24 ENCOUNTER — Ambulatory Visit: Payer: Self-pay | Admitting: Anesthesiology

## 2020-12-24 ENCOUNTER — Ambulatory Visit: Payer: Self-pay

## 2020-12-24 ENCOUNTER — Encounter: Payer: Self-pay | Admitting: Orthopaedic Surgery

## 2020-12-24 ENCOUNTER — Encounter
Admission: RE | Disposition: A | Discharge status: Home / Self Care | Payer: Self-pay | Source: Ambulatory Visit | Attending: Orthopaedic Surgery

## 2020-12-24 DIAGNOSIS — Z833 Family history of diabetes mellitus: Secondary | ICD-10-CM | POA: Insufficient documentation

## 2020-12-24 DIAGNOSIS — Z79899 Other long term (current) drug therapy: Secondary | ICD-10-CM | POA: Insufficient documentation

## 2020-12-24 DIAGNOSIS — E1165 Type 2 diabetes mellitus with hyperglycemia: Secondary | ICD-10-CM | POA: Insufficient documentation

## 2020-12-24 DIAGNOSIS — Z9889 Other specified postprocedural states: Secondary | ICD-10-CM

## 2020-12-24 DIAGNOSIS — Z801 Family history of malignant neoplasm of trachea, bronchus and lung: Secondary | ICD-10-CM | POA: Insufficient documentation

## 2020-12-24 DIAGNOSIS — Z794 Long term (current) use of insulin: Secondary | ICD-10-CM | POA: Insufficient documentation

## 2020-12-24 DIAGNOSIS — E1140 Type 2 diabetes mellitus with diabetic neuropathy, unspecified: Secondary | ICD-10-CM | POA: Insufficient documentation

## 2020-12-24 DIAGNOSIS — Z825 Family history of asthma and other chronic lower respiratory diseases: Secondary | ICD-10-CM | POA: Insufficient documentation

## 2020-12-24 DIAGNOSIS — G4730 Sleep apnea, unspecified: Secondary | ICD-10-CM | POA: Insufficient documentation

## 2020-12-24 DIAGNOSIS — Z8249 Family history of ischemic heart disease and other diseases of the circulatory system: Secondary | ICD-10-CM | POA: Insufficient documentation

## 2020-12-24 DIAGNOSIS — N186 End stage renal disease: Secondary | ICD-10-CM | POA: Insufficient documentation

## 2020-12-24 DIAGNOSIS — S82002K Unspecified fracture of left patella, subsequent encounter for closed fracture with nonunion: Secondary | ICD-10-CM

## 2020-12-24 DIAGNOSIS — M1990 Unspecified osteoarthritis, unspecified site: Secondary | ICD-10-CM | POA: Insufficient documentation

## 2020-12-24 DIAGNOSIS — E785 Hyperlipidemia, unspecified: Secondary | ICD-10-CM | POA: Insufficient documentation

## 2020-12-24 DIAGNOSIS — Z7982 Long term (current) use of aspirin: Secondary | ICD-10-CM | POA: Insufficient documentation

## 2020-12-24 DIAGNOSIS — Z803 Family history of malignant neoplasm of breast: Secondary | ICD-10-CM | POA: Insufficient documentation

## 2020-12-24 DIAGNOSIS — Z887 Allergy status to serum and vaccine status: Secondary | ICD-10-CM | POA: Insufficient documentation

## 2020-12-24 DIAGNOSIS — K219 Gastro-esophageal reflux disease without esophagitis: Secondary | ICD-10-CM | POA: Insufficient documentation

## 2020-12-24 DIAGNOSIS — Z8781 Personal history of (healed) traumatic fracture: Secondary | ICD-10-CM

## 2020-12-24 DIAGNOSIS — X58XXXD Exposure to other specified factors, subsequent encounter: Secondary | ICD-10-CM | POA: Insufficient documentation

## 2020-12-24 DIAGNOSIS — Z818 Family history of other mental and behavioral disorders: Secondary | ICD-10-CM | POA: Insufficient documentation

## 2020-12-24 DIAGNOSIS — I120 Hypertensive chronic kidney disease with stage 5 chronic kidney disease or end stage renal disease: Secondary | ICD-10-CM | POA: Insufficient documentation

## 2020-12-24 DIAGNOSIS — E1122 Type 2 diabetes mellitus with diabetic chronic kidney disease: Secondary | ICD-10-CM | POA: Insufficient documentation

## 2020-12-24 DIAGNOSIS — Z7985 Long-term (current) use of injectable non-insulin antidiabetic drugs: Secondary | ICD-10-CM | POA: Insufficient documentation

## 2020-12-24 DIAGNOSIS — S82042K Displaced comminuted fracture of left patella, subsequent encounter for closed fracture with nonunion: Secondary | ICD-10-CM | POA: Insufficient documentation

## 2020-12-24 DIAGNOSIS — F17210 Nicotine dependence, cigarettes, uncomplicated: Secondary | ICD-10-CM | POA: Insufficient documentation

## 2020-12-24 HISTORY — PX: ORIF, PATELLA: SHX4906

## 2020-12-24 LAB — VH DEXTROSE STICK GLUCOSE: Glucose POCT: 177 mg/dL — ABNORMAL HIGH (ref 71–99)

## 2020-12-24 SURGERY — ORIF, PATELLA
Anesthesia: Anesthesia General | Site: Knee | Laterality: Left | Wound class: Clean

## 2020-12-24 MED ORDER — VH HYDROMORPHONE HCL PF 1 MG/ML CARPUJECT
INTRAMUSCULAR | Status: DC | PRN
Start: 2020-12-24 — End: 2020-12-24
  Administered 2020-12-24 (×2): .5 mg via INTRAVENOUS

## 2020-12-24 MED ORDER — LACTATED RINGERS IV SOLN
INTRAVENOUS | Status: DC | PRN
Start: 2020-12-24 — End: 2020-12-24

## 2020-12-24 MED ORDER — VH HYDROMORPHONE HCL PF 1 MG/ML CARPUJECT
0.2500 mg | INTRAMUSCULAR | Status: AC | PRN
Start: 2020-12-24 — End: 2020-12-24
  Administered 2020-12-24 (×4): 0.25 mg via INTRAVENOUS
  Filled 2020-12-24: qty 1

## 2020-12-24 MED ORDER — ROCURONIUM BROMIDE 100 MG/10ML IV SOLN
INTRAVENOUS | Status: AC
Start: 2020-12-24 — End: ?
  Filled 2020-12-24: qty 10

## 2020-12-24 MED ORDER — ROCURONIUM BROMIDE 50 MG/5ML IV SOLN
INTRAVENOUS | Status: DC | PRN
Start: 2020-12-24 — End: 2020-12-24
  Administered 2020-12-24: 25 mg via INTRAVENOUS
  Administered 2020-12-24: 20 mg via INTRAVENOUS
  Administered 2020-12-24: 5 mg via INTRAVENOUS

## 2020-12-24 MED ORDER — FENTANYL CITRATE (PF) 50 MCG/ML IJ SOLN (WRAP)
INTRAMUSCULAR | Status: AC
Start: 2020-12-24 — End: ?
  Filled 2020-12-24: qty 2

## 2020-12-24 MED ORDER — OXYCODONE HCL 5 MG PO TABS
5.0000 mg | ORAL_TABLET | Freq: Once | ORAL | Status: AC | PRN
Start: 2020-12-24 — End: 2020-12-24
  Administered 2020-12-24: 5 mg via ORAL
  Filled 2020-12-24: qty 1

## 2020-12-24 MED ORDER — SUGAMMADEX SODIUM 200 MG/2ML IV SOLN
INTRAVENOUS | Status: AC
Start: 2020-12-24 — End: ?
  Filled 2020-12-24: qty 2

## 2020-12-24 MED ORDER — GLYCOPYRROLATE 0.2 MG/ML IJ SOLN (WRAP)
INTRAMUSCULAR | Status: AC
Start: 2020-12-24 — End: ?
  Filled 2020-12-24: qty 2

## 2020-12-24 MED ORDER — FENTANYL CITRATE (PF) 50 MCG/ML IJ SOLN (WRAP)
INTRAMUSCULAR | Status: DC | PRN
Start: 2020-12-24 — End: 2020-12-24
  Administered 2020-12-24 (×2): 50 ug via INTRAVENOUS

## 2020-12-24 MED ORDER — SUCCINYLCHOLINE CHLORIDE 20 MG/ML IJ SOLN
INTRAMUSCULAR | Status: DC | PRN
Start: 2020-12-24 — End: 2020-12-24
  Administered 2020-12-24: 200 mg via INTRAVENOUS

## 2020-12-24 MED ORDER — ONDANSETRON HCL 4 MG/2ML IJ SOLN
4.0000 mg | Freq: Once | INTRAMUSCULAR | Status: DC | PRN
Start: 2020-12-24 — End: 2020-12-24

## 2020-12-24 MED ORDER — ONDANSETRON HCL 4 MG/2ML IJ SOLN
INTRAMUSCULAR | Status: AC
Start: 2020-12-24 — End: ?
  Filled 2020-12-24: qty 2

## 2020-12-24 MED ORDER — ONDANSETRON HCL 4 MG/2ML IJ SOLN
INTRAMUSCULAR | Status: DC | PRN
Start: 2020-12-24 — End: 2020-12-24
  Administered 2020-12-24: 4 mg via INTRAVENOUS

## 2020-12-24 MED ORDER — PROPOFOL 200 MG/20ML IV EMUL
INTRAVENOUS | Status: AC
Start: 2020-12-24 — End: ?
  Filled 2020-12-24: qty 20

## 2020-12-24 MED ORDER — VH HYDROMORPHONE HCL PF 1 MG/ML CARPUJECT
INTRAMUSCULAR | Status: AC
Start: 2020-12-24 — End: ?
  Filled 2020-12-24: qty 1

## 2020-12-24 MED ORDER — PROPOFOL 200 MG/20ML IV EMUL
INTRAVENOUS | Status: DC | PRN
Start: 2020-12-24 — End: 2020-12-24
  Administered 2020-12-24: 50 mg via INTRAVENOUS
  Administered 2020-12-24: 150 mg via INTRAVENOUS

## 2020-12-24 MED ORDER — DEXAMETHASONE SODIUM PHOSPHATE 4 MG/ML IJ SOLN
INTRAMUSCULAR | Status: DC | PRN
Start: 2020-12-24 — End: 2020-12-24
  Administered 2020-12-24: 4 mg via INTRAVENOUS

## 2020-12-24 MED ORDER — DEXAMETHASONE SODIUM PHOSPHATE 4 MG/ML IJ SOLN
INTRAMUSCULAR | Status: AC
Start: 2020-12-24 — End: ?
  Filled 2020-12-24: qty 1

## 2020-12-24 MED ORDER — ACETAMINOPHEN 10 MG/ML IV SOLN
INTRAVENOUS | Status: DC | PRN
Start: 2020-12-24 — End: 2020-12-24
  Administered 2020-12-24: 1000 mg via INTRAVENOUS

## 2020-12-24 MED ORDER — SUGAMMADEX SODIUM 200 MG/2ML IV SOLN
INTRAVENOUS | Status: DC | PRN
Start: 2020-12-24 — End: 2020-12-24
  Administered 2020-12-24: 200 mg via INTRAVENOUS

## 2020-12-24 MED ORDER — STERILE WATER FOR INJECTION IJ SOLN
2.0000 g | Freq: Once | INTRAMUSCULAR | Status: AC
Start: 2020-12-24 — End: 2020-12-24
  Administered 2020-12-24: 11:00:00 2 g via INTRAVENOUS
  Filled 2020-12-24: qty 2000

## 2020-12-24 MED ORDER — HYDROCODONE-ACETAMINOPHEN 5-325 MG PO TABS
1.0000 | ORAL_TABLET | Freq: Four times a day (QID) | ORAL | 0 refills | Status: DC | PRN
Start: 2020-12-24 — End: 2021-01-13

## 2020-12-24 MED ORDER — FENTANYL CITRATE (PF) 50 MCG/ML IJ SOLN (WRAP)
25.0000 ug | INTRAMUSCULAR | Status: AC | PRN
Start: 2020-12-24 — End: 2020-12-24
  Administered 2020-12-24 (×4): 25 ug via INTRAVENOUS
  Filled 2020-12-24: qty 2

## 2020-12-24 MED ORDER — VH HYDROMORPHONE HCL PF 1 MG/ML CARPUJECT
0.5000 mg | INTRAMUSCULAR | Status: DC | PRN
Start: 2020-12-24 — End: 2020-12-24
  Administered 2020-12-24 (×2): 0.5 mg via INTRAVENOUS
  Filled 2020-12-24: qty 1

## 2020-12-24 MED ORDER — GLYCOPYRROLATE 0.2 MG/ML IJ SOLN (WRAP)
INTRAMUSCULAR | Status: DC | PRN
Start: 2020-12-24 — End: 2020-12-24
  Administered 2020-12-24: .4 mg via INTRAVENOUS

## 2020-12-24 MED ORDER — SUCCINYLCHOLINE CHLORIDE 20 MG/ML IJ SOLN
INTRAMUSCULAR | Status: AC
Start: 2020-12-24 — End: ?
  Filled 2020-12-24: qty 10

## 2020-12-24 MED ORDER — VH DEXMEDETOMIDINE HCL 200 MCG/2ML IV SOLN BOLUS FROM BAG
INTRAVENOUS | Status: DC | PRN
Start: 2020-12-24 — End: 2020-12-24
  Administered 2020-12-24: 12 ug via INTRAVENOUS
  Administered 2020-12-24: 8 ug via INTRAVENOUS

## 2020-12-24 MED ORDER — FENTANYL CITRATE (PF) 50 MCG/ML IJ SOLN (WRAP)
50.0000 ug | INTRAMUSCULAR | Status: DC | PRN
Start: 2020-12-24 — End: 2020-12-24
  Administered 2020-12-24 (×2): 50 ug via INTRAVENOUS
  Filled 2020-12-24: qty 2

## 2020-12-24 SURGICAL SUPPLY — 32 items
BANDAGE ESMARK 6" LF (Dressings) ×2 IMPLANT
BANDAGE ORTHO DBL ACE 6" UNST (Dressings) ×1 IMPLANT
BANDAGE WEBRIL 6 NONSTERILE (Dressings) ×2 IMPLANT
COVER LITE HANDLE DISP (Supply) ×4 IMPLANT
CUFF TOURNIQUET 34 (Supply) ×2 IMPLANT
DRAPE C-ARM X-RAY (Supply) ×2 IMPLANT
DRAPE C-ARMOUR (Supply) ×1 IMPLANT
DRAPE U (Supply) ×2 IMPLANT
DRSG XEROFORM 5 X 9 (Dressings) ×1 IMPLANT
FIBERWIRE 5-0 BLUE W/NDL (Supply) ×2 IMPLANT
GLOVE BIOGEL PI ORTHO SZ 8.0 (Supply) ×2 IMPLANT
GLOVE BIOGEL UND PI IND SZ 8.5 (Supply) ×2 IMPLANT
GLOVE SURGEON SYN P/F SZ 8 (Supply) ×4 IMPLANT
GOWN AURORA NONREINF STER LG (Supply) ×2 IMPLANT
GOWN AURORA NONREINF STER XLG (Supply) ×4 IMPLANT
IMMOBILIZER KNEE 19 INCHES (Supply) ×1 IMPLANT
KIT BIO-TENODESIS DISP (Supply) ×2 IMPLANT
NDL 1/2 CIR TROCAR MAYO (Supply) ×2 IMPLANT
PACK EXTREMITY-LF (Supply) ×2 IMPLANT
PAD ABD STERILE 8 X 10 (Dressings) ×2 IMPLANT
PAD ARMBOARD 20 X 8 (Supply) ×2 IMPLANT
PADDING CAST  6 IN X 4 YDS (Supply) ×3 IMPLANT
SET CYSTO TUR Y TYPE (Supply) ×2 IMPLANT
SHEET EXTREMITY T DRAPE (Supply) ×2 IMPLANT
SOL SALINE IRRIG 3000ML (Supply) ×1 IMPLANT
STERISTRIP 1/2 IN X 4 IN (Supply) ×2 IMPLANT
SUT ETHILON 3-0 1669H (Supply) ×2 IMPLANT
SUT VICRYL 0 J534H (Supply) ×2 IMPLANT
SUT VICRYL COATED 2-0 J869H (Supply) ×3 IMPLANT
TOWEL BLUE STERILE 6 PER PK (Supply) ×4 IMPLANT
UNDERPAD BLUE 23X36  LF (Supply) ×2 IMPLANT
VIAL TINCTURE BENZOIN STERI-ST (Supply) ×2 IMPLANT

## 2020-12-24 NOTE — Anesthesia Preprocedure Evaluation (Signed)
Anesthesia Evaluation    AIRWAY    Mallampati: III    TM distance: >3 FB  Neck ROM: full  Mouth Opening:full   CARDIOVASCULAR    cardiovascular exam normal, regular and normal       DENTAL    no notable dental hx     PULMONARY    pulmonary exam normal and clear to auscultation     OTHER FINDINGS    Multiple missing teeth              Relevant Problems   CARDIO   (+) Essential hypertension      GI   (+) GERD (gastroesophageal reflux disease)      GU/RENAL   (+) CKD (chronic kidney disease)   (+) Diabetic nephropathy      ENDO   (+) Type 2 diabetes mellitus with hyperglycemia, with long-term current use of insulin      OTHER   (+) Charcot's joint of foot, right               Anesthesia Plan    ASA 3     general                     intravenous induction           Post op pain management: per surgeon    informed consent obtained    Plan discussed with CRNA.    ECG reviewed  pertinent labs reviewed             Signed by: Henreitta Leber, MD 12/24/20 9:36 AM

## 2020-12-24 NOTE — Transfer of Care (Signed)
Anesthesia Transfer of Care Note    Patient: [name]    Last vitals:   Vitals:    12/24/20 1315   BP: 146/78   Pulse: 78   Resp: 16   Temp: 37.1 C (98.8 F)   SpO2: 97%       Oxygen: Nasal Cannula     Mental Status:awake    Airway: Natural    Cardiovascular Status:  stable    Report to RN

## 2020-12-24 NOTE — Brief Op Note (Signed)
BRIEF OP NOTE    Date Time: 12/24/20 12:54 PM    Patient Name:   [name]    Date of Operation:   12/24/2020    Providers Performing:   Surgeon(s):  Marlin Canary, MD    Assistant (s):   Circulator: Poppo, Rande Brunt, RN  Relief Circulator: Nino Parsley, RN  Relief Scrub: Layne Benton  First Assistant: Donata Duff, RN  Team Leader: Joelyn Oms, RN  Technologist: Juanetta Gosling L    Operative Procedure:   Procedure(s):  LEFT PATELLA HARDWARE REVISION / ORIF - Wound Class: Clean     Preoperative Diagnosis:   Pre-Op Diagnosis Codes:     * Closed displaced fracture of left patella, unspecified fracture morphology, initial encounter [S82.002A]    Postoperative Diagnosis:   Post-Op Diagnosis Codes:     * Closed displaced fracture of left patella, unspecified fracture morphology, initial encounter [S82.002A]    Anesthesia:   General    Estimated Blood Loss:    * No values recorded between 12/24/2020 10:57 AM and 12/24/2020 12:54 PM *    Implants:   * No implants in log *    Drains:   Drains: no    Specimens:   * No specimens in log *      Findings:   Failed fixation left patella fracture  Retained hardware     Complications:   none      Signed by: Marlin Canary, MD                                                                           [facility]

## 2020-12-24 NOTE — Discharge Instr - AVS First Page (Signed)
Fracture: Patella  You have had surgery for a fractured patella.  You will likely be weight bearing as tolerated or partial weight bearing for 6 weeks, sometimes longer.  You will be seen in the office regularly to ensure proper healing and rehabilitation.  Call the [facility] office at [phone] with any questions or concerns.    Home Care:  You will be in a knee immobilzer.  Keep brace on at all times.  Do NOT bend your knee.    You may remove the dressing in 3 days.  You may also remove the brace at that point to shower.  Afterwards pat the wounds dry thoroughly.  You may wish to apply a light dressing to the area if there is any drainage.  Do NOT submerge wounds under water. I.e. In a bathtub, swimming pool, hot tub, etc.  Unless you were told otherwise, use crutches or a walker when bearing weight on the injured leg. (Crutches and walkers can be rented at [employer] and surgical/orthopedic supply stores).   Keep your leg elevated to reduce pain and swelling. When sleeping, place a pillow under the injured leg. When sitting, support the injured leg so it is level with your waist. This is very important during the first 48 hours.  Apply an ice pack (ice cubes in a plastic bag, wrapped in a towel) over the injured area for 20 minutes every 1-2 hours the first day. You can place the ice pack directly over the splint/cast. Continue with ice packs 3-4 times a day for the next two days, then as needed for the relief of pain and swelling.  You may use acetaminophen (Tylenol) to control pain, unless another pain medicine was prescribed. [ NOTE : If you have chronic liver or kidney disease or ever had a stomach ulcer or GI bleeding, talk with your doctor before using these medicines.    Follow Up  with [place] in 2 weeks to be sure the bone is healing properly.      Get Prompt Medical Attention If Any Of The Following Occur:  Increased redness, swelling, or drainage from the  wounds  Toes become swollen, cold, blue, numb or tingly  Fevers or chills  Drastically worsening pain unrelieved with elevation and pain medication

## 2020-12-24 NOTE — H&P (Signed)
[facility]  H&P    Demographics:      Date Time: 12/24/2020 10:09 AM  Patient Name: [name], [name]  DoB: 06-14-1965  Age: 55 y.o.   PCP: Tanja Port, MD    History of Present Illness:    Failed fixation left patella fracture    55 y.o. year old patient presents for revision ORIF left patella fracture.  Index procedure performed at an outside facility ~3 months ago but went on to fail.  Patient rteferred to Ortho Trauma for management.      Medical History:      Past Medical History:   Diagnosis Date    Abnormal vision     Arthritis     Complication of anesthesia     last time he was kicking his legs had to wake him up    Diabetes mellitus     Diabetic ulcer of left foot 12/26/2014    Disc     End-stage renal disease 2021    Stage 3 Kidney Disease    Gastroesophageal reflux disease     Hyperlipidemia     Hypertension     Low back pain     Neuropathy     Seasonal allergic rhinitis     Sleep apnea     not tested    Type 2 diabetes mellitus, controlled       Past Surgical History:   Procedure Laterality Date    ARTHRODESIS, TOE Left 05/04/2015    Procedure: ARTHRODESIS, TOE;  Surgeon: Janora Norlander, DPM;  Location: [facility];  Service: Podiatry;  Laterality: Left;  1st MPJ FUSION    DEBRIDEMENT & IRRIGATION, LOWER EXTREMITY Left 03/12/2015    Procedure: [place], LOWER EXTREMITY;  Surgeon: Janora Norlander, DPM;  [facility];  Service: Podiatry;  Laterality: Left;  I&D LEFT FOOT FOR BONE INFECTION    DEBRIDEMENT & IRRIGATION, LOWER EXTREMITY Left 03/07/2016    Procedure: DEBRIDEMENT & IRRIGATION, LEFT FOOT 2ND TOE, EXCISION OF 2ND METATARSEL;  Surgeon: Janora Norlander, DPM;  [facility];  Service: Podiatry;  Laterality: Left;  I&D Left Foot    FOOT SURGERY Right 05/2020    [facility]    HAND SURGERY      ORIF, PATELLA Left 09/21/2020    Procedure: ORIF, LEFT KNEE PATELLA;  Surgeon: Eugenie Filler, MD;  Location:  [place];  Service: Orthopedics;  Laterality: Left;    SESAMOIDECTOMY Left 05/24/2019    Procedure: SESAMOIDECTOMY;  Surgeon: [name], DPM;  Location: [facility];  Service: Podiatry;  Laterality: Left;      Medications Prior to Admission   Medication Sig Dispense Refill Last Dose    amLODIPine (NORVASC) 5 MG tablet TAKE 1 TABLET (5 MG TOTAL) BY MOUTH DAILY. 30 tablet 11 12/23/2020    aspirin EC 81 MG EC tablet Take 1 tablet (81 mg total) by mouth every evening. 90 tablet 3 12/23/2020    atorvastatin (LIPITOR) 40 MG tablet TAKE 1 TABLET BY MOUTH ONCE DAILY 90 tablet 2 12/23/2020    Blood Glucose Monitoring Suppl (RELION PRIME MONITOR) Device Use USE TO TEST BLOOD GLUCOSE FOUR TIMES DAILY   12/24/2020    calcium carbonate (OS-CAL) 600 MG Tab tablet Take 1 tablet (600 mg) by mouth daily   12/23/2020    Continuous Blood Gluc Receiver (Dexcom G6 Receiver) Device 1 Device by Does not apply route daily 1 Device 0 12/23/2020    Continuous  Blood Gluc Sensor (FreeStyle Libre 14 Day Sensor) Misc Use 1 each every 14 (fourteen) days 6 each 1 12/23/2020    gabapentin (NEURONTIN) 300 MG capsule Take 1 capsule (300 mg total) by mouth 3 (three) times daily 90 capsule 5 12/23/2020    glucose blood test strip 1 each by Other route 4 (four) times daily Use as instructed   12/23/2020    insulin glargine (BASAGLAR KWIKPEN) 100 UNIT/ML injection pen Inject 68 Units into the skin daily 15 mL 0 12/23/2020    Insulin Pen Needle 31G X 6 MM Misc Use Use up to 7 times a day as directed   12/23/2020    liraglutide (Victoza) 18 MG/3ML injection INJECT 1.8MG  SUBCUTANEOUSLY ONCE DAILY 9 mL 5 12/23/2020    Multiple Vitamin (multivitamin) capsule Take 1 capsule by mouth daily   12/23/2020    NovoLOG FlexPen 100 UNIT/ML injection pen Take 6 units before breakfast and lunch, take 10 units before supper, PLUS add correction scale. Max daily dose 60 units 15 mL 3 12/23/2020    Vitamin D3 (CHOLECALCIFEROL) 50 MCG (2000 UT) tablet Take 1  tablet (50 mcg total) by mouth daily   12/23/2020    HYDROcodone-acetaminophen (Norco) 5-325 MG per tablet Take 1 tablet by mouth every 6 (six) hours as needed for Pain 28 tablet 0 Unknown    ondansetron (Zofran) 4 MG tablet Take 1 tablet (4 mg total) by mouth every 6 (six) hours as needed for Nausea 24 tablet 0 Unknown    oxyCODONE-acetaminophen (PERCOCET) 5-325 MG per tablet Take 1 tablet by mouth every 6 (six) hours as needed for Pain 28 tablet 0 Unknown     Allergies   Allergen Reactions    Influenza Virus Vaccine Anaphylaxis      Social History     Tobacco Use    Smoking status: Some Days     Packs/day: 0.25     Types: Cigarettes     Last attempt to quit: 09/06/2017     Years since quitting: 3.3    Smokeless tobacco: Never    Tobacco comments:     3 packs a week   Substance Use Topics    Alcohol use: No     Alcohol/week: 0.0 standard drinks     Comment: in past      Family History   Problem Relation Age of Onset    Diabetes Mother     Lung cancer Mother     Cancer Mother         lung    Depression Sister     COPD Sister     Cancer Sister         breast    Hypertension Brother         Review of Systems:      Review of systems:   Constitutional:  Negative for recent illness              Cardio:  No chest pain              Respiratory:  No SOB   MSK:  As noted in HPI, otherwise negative    Objective:        Vital Signs: BP 171/87    Pulse 87    Temp 98.1 F (36.7 C) (Temporal)    Resp 17    Ht 1.676 m (5\' 6" )    Wt 106.1 kg (233 lb 14.5 oz)    SpO2 98%    BMI 37.75 kg/m  Appearance: Well nourished, well developed 55 y.o. male patient.     Neurologic:      Vascular:                                                                                    A/O x3,  Patient is Neurologically stable in the left lower extremity (chronically neuropathic).    Extremity warm, well perfused  Cap refill <2 sec     Integumentary: Swelling not significant.  No open lesions      MSK: (+)tenderness affected extremity  (+)pain with  ROM                             Imaging Review:      No new films today       Assessment:      Failed fixation left patella fracture    Plan:      OR today for revision surgery left patella fracture

## 2020-12-24 NOTE — Anesthesia Postprocedure Evaluation (Signed)
Post Operative Evaluation    Patient: [name]    Procedures performed: Procedure(s):  LEFT PATELLA HARDWARE REVISION / ORIF    Patient location: PACU    Last vitals:   Vitals:    12/24/20 1500   BP: 134/74   Pulse: 81   Resp: 21   Temp: 36.5 C (97.7 F)   SpO2: 92%       BP: 134/74   Temp: 36.5 C (97.7 F)   Heart Rate: 81   Resp Rate: 21        Post pain: Pain control adequate, continue current therapy     Mental Status:  Awake, alert    Respiratory Function: room air    Cardiovascular: stable    Nausea/Vomiting: therapy adequate; appropriate medications received    Hydration Status: adequate    Post assessment: no apparent anesthetic complications, no reportable events and no evidence of recall

## 2020-12-29 ENCOUNTER — Encounter: Payer: Self-pay | Admitting: Orthopaedic Surgery

## 2020-12-31 ENCOUNTER — Ambulatory Visit (RURAL_HEALTH_CENTER): Payer: Self-pay

## 2021-01-04 NOTE — Op Note (Signed)
Date: 12/24/2020    PREOPERATIVE DIAGNOSIS: Failed fixation, left patella fracture.    POSTOPERATIVE DIAGNOSIS: Failed fixation, left patella fracture.    PROCEDURES:  1.  Removal of hardware, left patella fracture.  2.  Revision open reduction and internal fixation, left      patella fracture.    SURGEON: Marlin Canary, M.D.    ANESTHESIA: General endotracheal.    ESTIMATED BLOOD LOSS: 100 cc.    COMPLICATIONS: None.    SPECIMEN: None.    INDICATION FOR SURGERY: [name] is a 55 year old male with  multiple medical comorbidities, who had undergone open reduction  and internal fixation of a left patella fracture at an outside  institution. He went on to develop a failed fixation, and was  referred to [facility]opedic Trauma in [place] for definitive  management.  After review of his history and imaging studies,  surgery was recommended.    PROCEDURE NOTE: On the day of the operation, [name] was seen  in the preoperative area.  The left knee was marked.  Informed  consent was obtained.  He was then taken to the operating room,  where anesthesia was induced.  He was given a preoperative dose  of antibiotics, placed onto the OR table in a supine position.  Time-out was performed identifying the patient, the procedure,  and the operative team members.  The left leg was prepped and  draped in the usual sterile fashion.  I then began the case by  utilizing his previous incision over the anterior knee, was  about 8-10 cm in length, dissected down with electrocautery to  subcuticular tissue.  I raised some flaps medially and laterally  to give [place] exposure of the patella.  The fracture through the  inferior pole of the patella was markedly distracted.  We could  see some of the prior hardware and K-wires was evident and  proceed with removal of the K-wires.  I then performed a  thorough debridement of the knee and the fracture at the  inferior pole of the patella was small and quite comminuted.  We  left some of the  bone in place, but debrided the other portions  of it.  It was then became evident that we had to perform a  suture repair.  In essence, a patellar tendon repair.  I used a  #5 FiberWire suture and passed it in a Krackow manner in a  number of limbs through the patellar tendon.  I then passed the  FiberWire sutures using an eyed needle through the  superior pole of the patella.  I then, with the knee held in  extension, tied the FiberWire suture down over the superior  pole of the patella giving [place] a reasonable reconstruction.  I  then oversewed the medial and lateral retinaculum with #2 FiberWire to  help supplement our fixation.  I then released the tourniquet,  irrigated the wound copiously with saline and closed in layers  using 0-Vicryl for deep tissue, 2-0 Vicryl for subcuticular  tissue, and 3-0 Monocryl for skin.  Steri-Strips and dry sterile  dressing were applied.  The patient was placed into a knee  immobilizer, was allowed to awaken and was taken to the recovery  room in stable condition.        [id number]  DD: 01/04/2021 11:10:19  DT: 01/04/2021 12:25:29  JOB: [id number]

## 2021-01-12 ENCOUNTER — Ambulatory Visit (RURAL_HEALTH_CENTER): Payer: Self-pay

## 2021-01-13 ENCOUNTER — Ambulatory Visit (INDEPENDENT_AMBULATORY_CARE_PROVIDER_SITE_OTHER): Payer: Self-pay | Admitting: Medical

## 2021-01-13 ENCOUNTER — Ambulatory Visit
Admission: RE | Admit: 2021-01-13 | Discharge: 2021-01-13 | Disposition: A | Discharge status: Home / Self Care | Payer: Self-pay | Source: Ambulatory Visit | Attending: Medical | Admitting: Medical

## 2021-01-13 ENCOUNTER — Other Ambulatory Visit (INDEPENDENT_AMBULATORY_CARE_PROVIDER_SITE_OTHER): Payer: Self-pay | Admitting: Medical

## 2021-01-13 ENCOUNTER — Encounter (INDEPENDENT_AMBULATORY_CARE_PROVIDER_SITE_OTHER): Payer: Self-pay | Admitting: Medical

## 2021-01-13 DIAGNOSIS — Z9889 Other specified postprocedural states: Secondary | ICD-10-CM

## 2021-01-13 DIAGNOSIS — Z8781 Personal history of (healed) traumatic fracture: Secondary | ICD-10-CM

## 2021-01-13 DIAGNOSIS — S82032A Displaced transverse fracture of left patella, initial encounter for closed fracture: Secondary | ICD-10-CM

## 2021-01-13 DIAGNOSIS — Z09 Encounter for follow-up examination after completed treatment for conditions other than malignant neoplasm: Secondary | ICD-10-CM | POA: Insufficient documentation

## 2021-01-13 MED ORDER — HYDROCODONE-ACETAMINOPHEN 5-325 MG PO TABS
1.0000 | ORAL_TABLET | Freq: Four times a day (QID) | ORAL | 0 refills | Status: DC | PRN
Start: 2021-01-13 — End: 2021-04-12

## 2021-01-13 NOTE — Progress Notes (Signed)
[facility]  Office Note    ASSESSMENT/PLAN   55 y.o. male with:    Failed fixation, left patella fracture  S/p Removal of hardware, left patella fracture, revision ORIF    3 weeks since DOS on 12/24/20         WB status: WBAT LLE in the knee brace.   PT/OT script provided: NO  Work/SNF/school note provided: NO  Follow-up: in 3 week(s) for re-evaluation  Anticipated imaging: Surveillance      -Patient with some diastasis on today's films. When dressings were being removed, patient actively engaging knee and quad muscles. I reviewed with patient he should avoid doing this and when he does take the brace of for hygiene, he needs to keep leg straight. He acknowledged. He placed him in knee ROM brace today locked in extension for a few more weeks of healing before initiating post op rehab protocol.     - NORCO refilled    - Discussed use of RICE and/or OTC medications PRN: NO  - Patient has been advised to follow-up sooner for persistent and/or worsening symptoms    CHIEF COMPLAINT     Chief Complaint   Patient presents with    Post-op     DOI: 10/21 & 12/06/20 DOS: 12/24/20 S/P REV ORIF L PATELLA HX: FELT A "POP"       HPI   55 y.o. male presents for:    Failed fixation, left patella fracture  S/p Removal of hardware, left patella fracture, revision ORIF    3 weeks since DOS on 12/24/20    Patient reports that he is doing ok, he has maintained brace except for hygiene.        PHYSICAL EXAM     Appearance:  Well developed, well nourished 55 y.o. male patient in NAD.    Respiratory:  Normal respiratory effort without distress.    Cardiovascular: Extremity warm and well perfused. Brisk cap refill.    Neurologic:  Sensation to light touch intact in distal extremity.    Musculoskeletal:  Left lower extremity - Dressing removed, incision intact and healed, no sign of infection. Sutures removed by myself, pt tolerated well. NVI distally.      RADIOLOGY     Plain films of the left knee 2V were reviewed and  interpreted by me.     Date Imaged:  01/13/2021      Date Reviewed:  01/13/2021     [facility]  [facility]     Indication:  injury, pain       Interpretation: Bony alignment appears stable compared to prior imaging.    ---    XR Knee 1 Or 2 Views Left    Result Date: 01/13/2021  Comminuted fracture of the inferior half of the patella. The degree of distraction is less than the previous exam. Significant soft tissue swelling is present inferior to the patella. ReadingStation:[email or web address]     DEMOGRAPHICS     Date and Time: 01/13/2021 2:53 PM  Patient Name: [name], [name]  DOB: 1965/08/25  Age: 57 y.o.   PCP: Stephen Port, MD    History provided by patient.     REVIEW OF SYSTEMS     ROS as noted in HPI  ---    This chart was generated with the use of an electronic medical record and may contain errors not intended by the author.    ---  Durenda Hurt, [place]

## 2021-01-27 ENCOUNTER — Ambulatory Visit
Admission: RE | Admit: 2021-01-27 | Discharge: 2021-01-27 | Disposition: A | Discharge status: Home / Self Care | Payer: Self-pay | Source: Ambulatory Visit | Attending: Internal Medicine | Admitting: Internal Medicine

## 2021-01-27 LAB — LIPID PANEL
Cholesterol: 182 mg/dL (ref 75–199)
Coronary Heart Disease Risk: 5.69
HDL: 32 mg/dL — ABNORMAL LOW (ref 40–55)
LDL Calculated: UNDETERMINED mg/dL
Triglycerides: 707 mg/dL — ABNORMAL HIGH (ref 10–150)
VLDL: UNDETERMINED (ref 0–40)

## 2021-01-27 LAB — BASIC METABOLIC PANEL
Anion Gap: 10.7 mMol/L (ref 7.0–18.0)
BUN / Creatinine Ratio: 11.8 Ratio (ref 10.0–30.0)
BUN: 35 mg/dL — ABNORMAL HIGH (ref 7–22)
CO2: 23.4 mMol/L (ref 20.0–30.0)
Calcium: 9 mg/dL (ref 8.5–10.5)
Chloride: 110 mMol/L (ref 98–110)
Creatinine: 2.96 mg/dL — ABNORMAL HIGH (ref 0.80–1.30)
EGFR: 24 mL/min/{1.73_m2} — ABNORMAL LOW (ref 60–150)
Glucose: 226 mg/dL — ABNORMAL HIGH (ref 71–99)
Osmolality Calculated: 294 mOsm/kg (ref 275–300)
Potassium: 4.1 mMol/L (ref 3.5–5.3)
Sodium: 140 mMol/L (ref 136–147)

## 2021-01-27 LAB — HEMOGLOBIN A1C
Estimated Average Glucose: 160 mg/dL
Hgb A1C, %: 7.2 %

## 2021-01-27 IMAGING — MR MRI LSPINE WO CONTRAST
5 of 6 series · 24 of 48 positions shown · non-contrast
Comparison: 09/30/05 plain films

HISTORY: 55-year-old male with radiculopathy lumbar.
TECHNIQUE: Multiplanar multisequential MR images were obtained of the lumbar spine without contrast administration.

[Series 2: bSSFP · coronal · 8.0mm · 1.37mm/px · 5 of 14 slices shown]
[im 1/14]
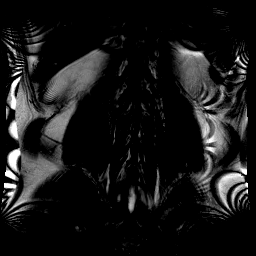
[im 4/14]
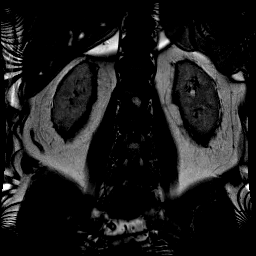
[im 7/14]
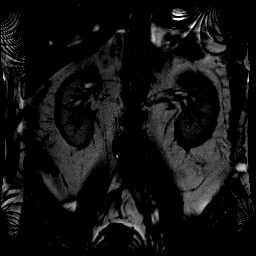
[im 10/14]
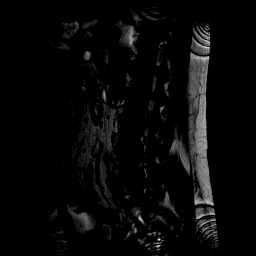
[im 14/14]
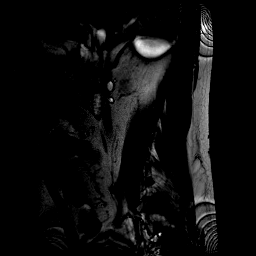

[Series 3: t2_sag · sagittal · 4.0mm · 0.38mm/px · 6 of 15 slices shown]
[im 1/15]
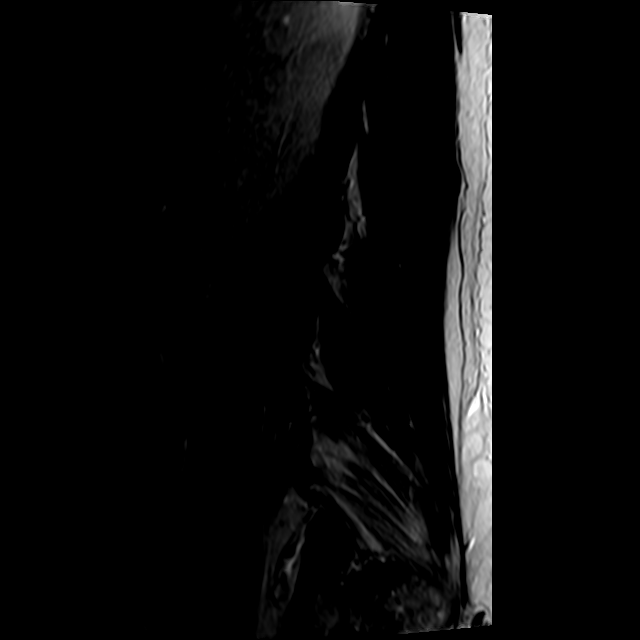
[im 3/15]
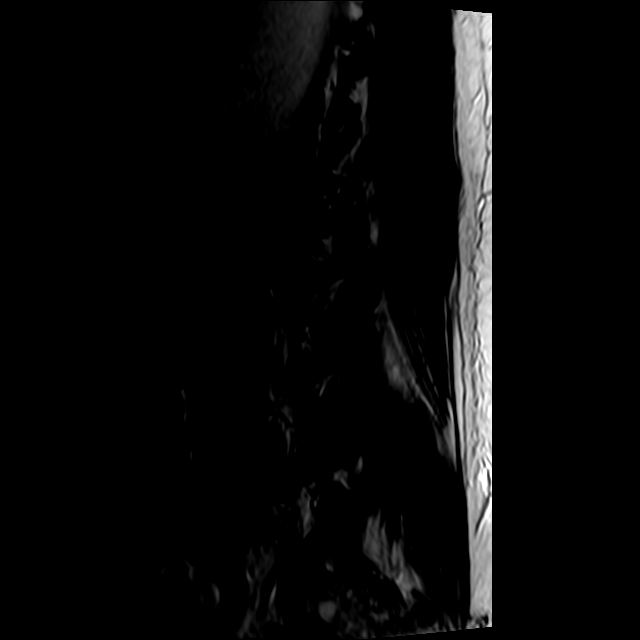
[im 6/15]
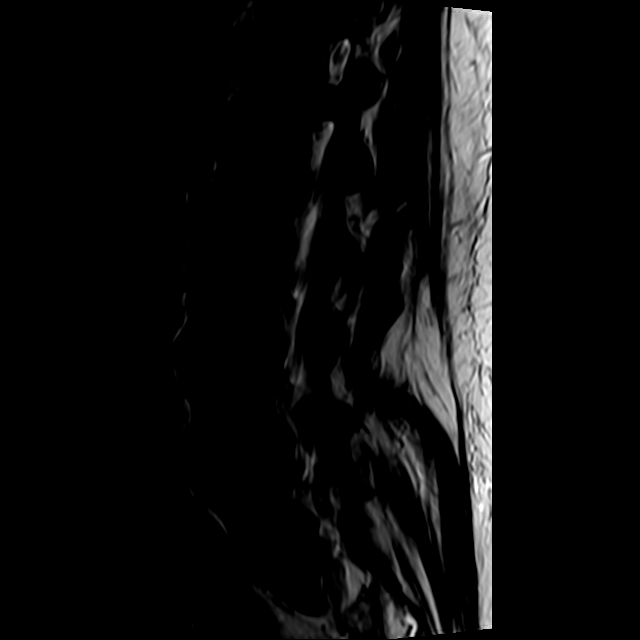
[im 9/15]
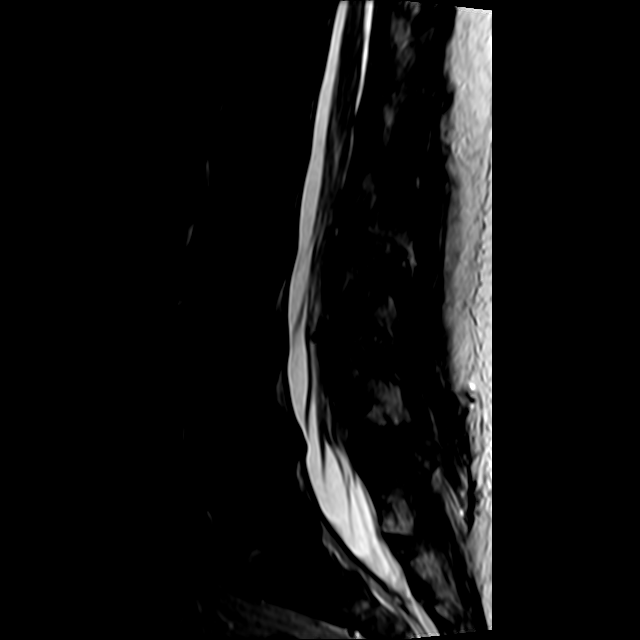
[im 12/15]
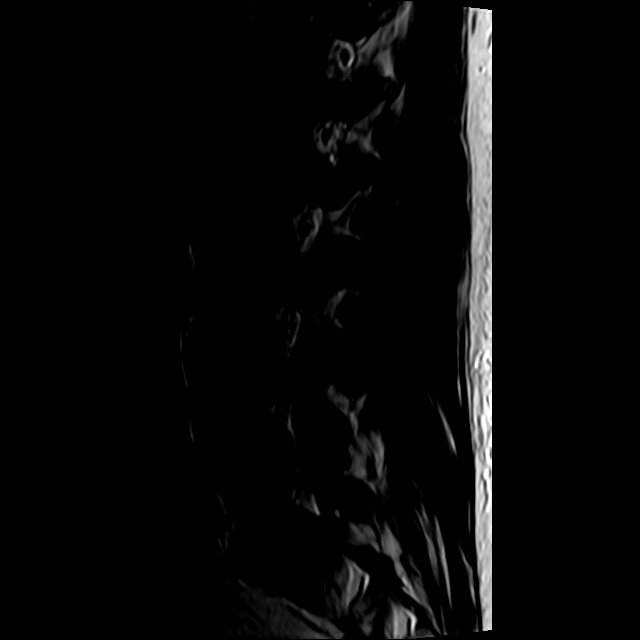
[im 15/15]
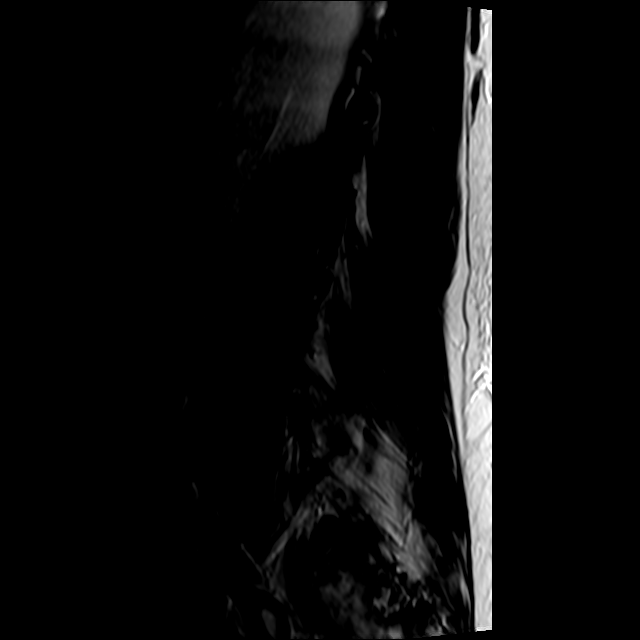

[Series 4: t1_sag · sagittal · 4.0mm · 0.75mm/px · 6 of 15 slices shown]
[im 1/15]
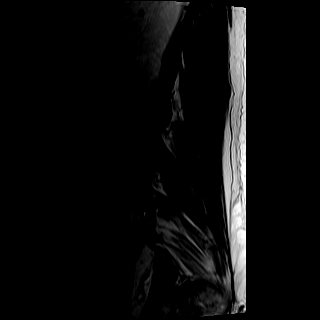
[im 3/15]
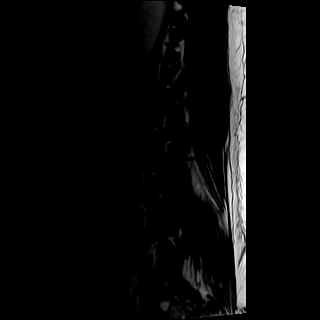
[im 6/15]
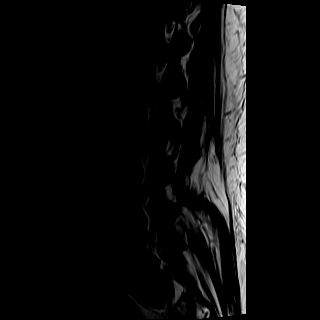
[im 9/15]
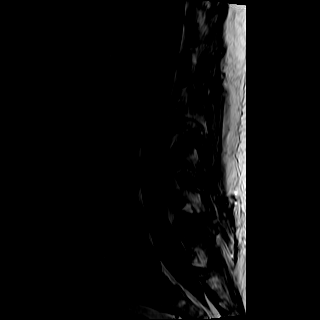
[im 12/15]
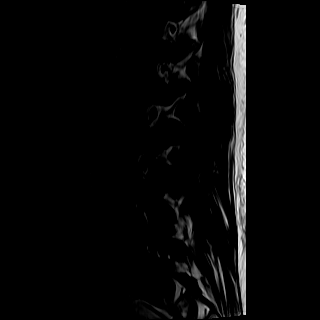
[im 15/15]
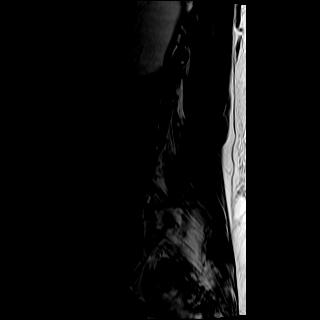

[Series 5: ir_sag · sagittal · 4.0mm · 0.47mm/px · 6 of 15 slices shown]
[im 1/15]
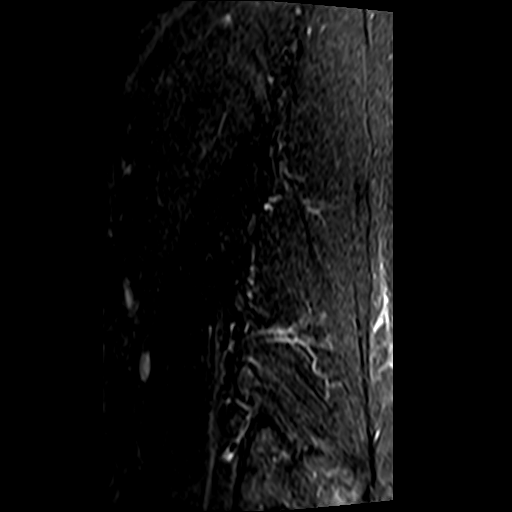
[im 3/15]
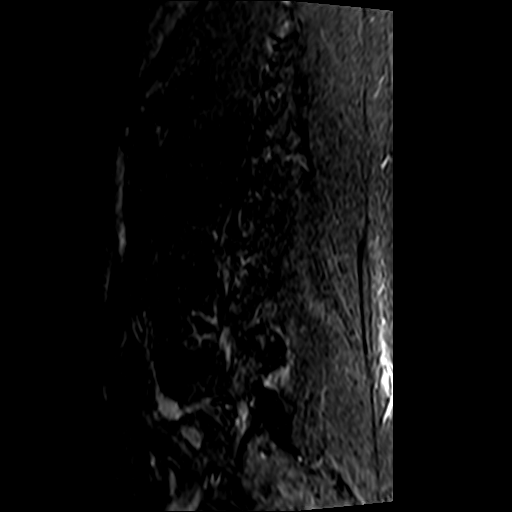
[im 6/15]
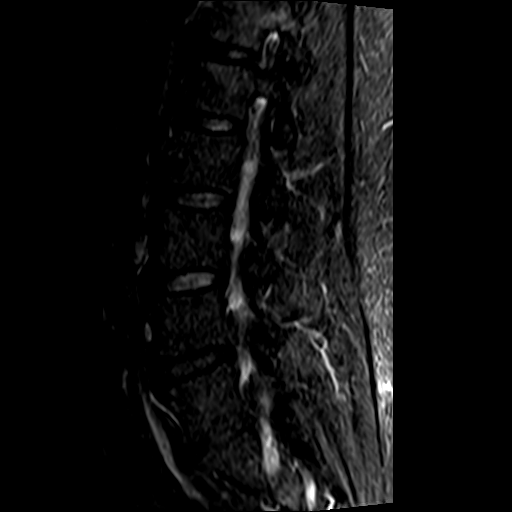
[im 9/15]
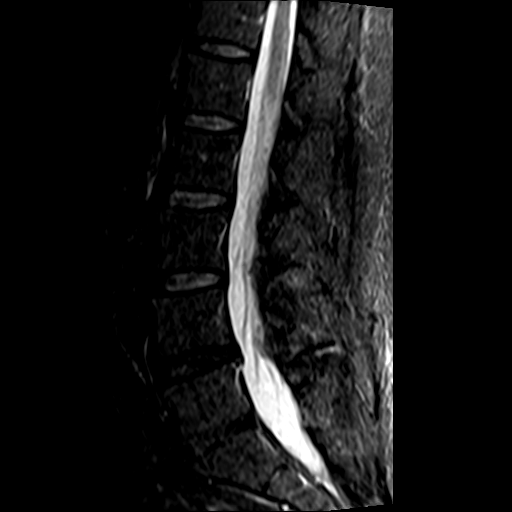
[im 12/15]
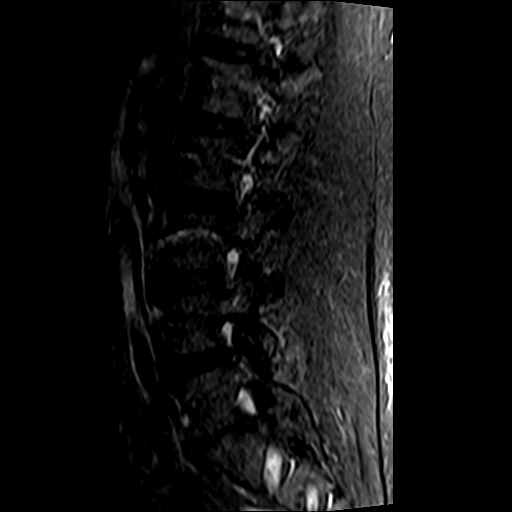
[im 15/15]
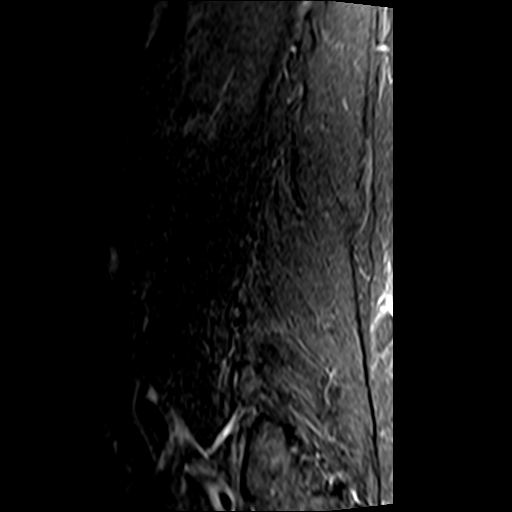

[Series 6: t1_axial_obl · axial · 4.0mm · 0.66mm/px · 1 of 27 slices shown]
[im 1/27]
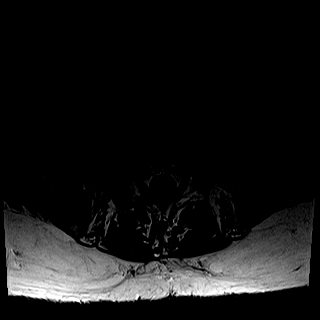

[24 of 48 positions shown; findings below may reference images not displayed]

FINDINGS: Vertebral body height and alignment: Within normal limits.

Degenerative disc changes: No significant degenerative disc disease is present.

Conus and cauda equina: Within normal limits.

Marrow signal: The marrow signal is unremarkable.

L5-S1: Previous interdisc fusion. No spinal stenosis although mild bilateral foraminal narrowings are present mostly facet arthropathy related.

L4-L5: Mild diffuse degenerated disc bulge which barely effaces the thecal sac. No spinal stenosis although there are very mild bilateral neural foraminal narrowings resulting.

L3-L4: No disc protrusion, canal stenosis, or foraminal stenosis.

L2-L3: No disc protrusion, canal stenosis, or foraminal stenosis.

L1-L2: No disc protrusion, canal stenosis, or foraminal stenosis.

T12-L1: No disc protrusion, canal stenosis, or foraminal stenosis.

Visualized SI joints: Intact

Visualized soft tissues: Unremarkable
IMPRESSION: No MRI evidence of severe lumbar spinal canal stenosis, disc herniation or significant neural foraminal impingement.

## 2021-02-01 ENCOUNTER — Other Ambulatory Visit (INDEPENDENT_AMBULATORY_CARE_PROVIDER_SITE_OTHER): Payer: Self-pay | Admitting: Medical

## 2021-02-01 DIAGNOSIS — S82032A Displaced transverse fracture of left patella, initial encounter for closed fracture: Secondary | ICD-10-CM

## 2021-02-02 ENCOUNTER — Encounter (INDEPENDENT_AMBULATORY_CARE_PROVIDER_SITE_OTHER): Payer: Self-pay | Admitting: Medical

## 2021-02-02 ENCOUNTER — Ambulatory Visit (INDEPENDENT_AMBULATORY_CARE_PROVIDER_SITE_OTHER): Payer: Self-pay | Admitting: Medical

## 2021-02-02 ENCOUNTER — Ambulatory Visit
Admission: RE | Admit: 2021-02-02 | Discharge: 2021-02-02 | Disposition: A | Discharge status: Home / Self Care | Payer: Self-pay | Source: Ambulatory Visit | Attending: Medical | Admitting: Medical

## 2021-02-02 DIAGNOSIS — S82002A Unspecified fracture of left patella, initial encounter for closed fracture: Secondary | ICD-10-CM

## 2021-02-02 DIAGNOSIS — S82032A Displaced transverse fracture of left patella, initial encounter for closed fracture: Secondary | ICD-10-CM | POA: Insufficient documentation

## 2021-02-02 DIAGNOSIS — Y999 Unspecified external cause status: Secondary | ICD-10-CM | POA: Insufficient documentation

## 2021-02-02 NOTE — Progress Notes (Signed)
[facility]  Office Note    ASSESSMENT/PLAN   55 y.o. male with:    Failed fixation, left patella fracture  S/p Removal of hardware, left patella fracture, revision ORIF     6 weeks since DOS on 12/24/20         WB status: WBAT LLE  PT/OT script provided: YES- Patella rehab protocol provided   Work/SNF/school note provided: NO  Follow-up: in 6 week(s) for re-evaluation  Anticipated imaging: Surveillance        - Discussed use of RICE and/or OTC medications PRN: NO  - Patient has been advised to follow-up sooner for persistent and/or worsening symptoms    CHIEF COMPLAINT     Chief Complaint   Patient presents with    Follow-up     DOI: 10/21 & 12/06/20 DOS: 12/24/20 RECK L PATELLA S/P REV ORIF L PATELLA HX: FELT A "POP"       HPI   55 y.o. male presents for:    Failed fixation, left patella fracture  S/p Removal of hardware, left patella fracture, revision ORIF     6 weeks since DOS on 12/24/20       Patient reports that he is doing ok.        PHYSICAL EXAM     Appearance:  Well developed, well nourished 55 y.o. male patient in NAD.    Respiratory:  Normal respiratory effort without distress.    Cardiovascular: Extremity warm and well perfused. Brisk cap refill.    Neurologic:  Sensation to light touch intact in distal extremity.    Musculoskeletal:  Left lower extremity - Incision is well healed without sign of infection.      RADIOLOGY     Plain films of the left knee 2V were reviewed and interpreted by me.     Date Imaged:  02/02/2021      Date Reviewed:  02/02/2021     Location[facility]  [facility]     Indication:  injury, pain       Interpretation:  Bony alignment appears stable compared to prior imaging.    ---    No results found.    DEMOGRAPHICS     Date and Time: 02/02/2021 10:38 AM  Patient Name: [name], [name]  DOB: Mar 21, 1965  Age: 65 y.o.   PCP: Tanja Port, MD    History provided by patient.     REVIEW OF SYSTEMS     ROS as noted in HPI    ---    This chart was generated with the use of  an electronic medical record and may contain errors not intended by the author.    ---  Durenda Hurt, [place]

## 2021-02-04 ENCOUNTER — Other Ambulatory Visit (RURAL_HEALTH_CENTER): Payer: Self-pay | Admitting: Family Medicine

## 2021-02-04 DIAGNOSIS — Z794 Long term (current) use of insulin: Secondary | ICD-10-CM

## 2021-03-09 ENCOUNTER — Telehealth (INDEPENDENT_AMBULATORY_CARE_PROVIDER_SITE_OTHER): Payer: Self-pay | Admitting: Orthopaedic Surgery

## 2021-03-09 DIAGNOSIS — S82002A Unspecified fracture of left patella, initial encounter for closed fracture: Secondary | ICD-10-CM

## 2021-03-09 NOTE — Telephone Encounter (Signed)
Please advise pt he may schedule appt & [facility] can retrieve order from [place].

## 2021-03-09 NOTE — Telephone Encounter (Signed)
Spoke with pt. He is calling [facility] to schedule. ce

## 2021-03-09 NOTE — Telephone Encounter (Signed)
I placed referral since previous one is likely expired. Patient was given protocol sheet for patella, he needs to bring with him to appt for the therapist.

## 2021-03-09 NOTE — Telephone Encounter (Signed)
Pt requesting PT order be sent to [facility] since it is closer to his home. Pt did not provide fax number. Ce    Buren Havey - [phone]

## 2021-03-11 ENCOUNTER — Ambulatory Visit (RURAL_HEALTH_CENTER): Payer: Self-pay

## 2021-03-15 ENCOUNTER — Other Ambulatory Visit (INDEPENDENT_AMBULATORY_CARE_PROVIDER_SITE_OTHER): Payer: Self-pay | Admitting: Medical

## 2021-03-15 DIAGNOSIS — S82002A Unspecified fracture of left patella, initial encounter for closed fracture: Secondary | ICD-10-CM

## 2021-03-15 NOTE — Progress Notes (Deleted)
[facility]  Office Note    ASSESSMENT/PLAN   56 y.o. male with:    Failed fixation, left patella fracture  S/p Removal of hardware, left patella fracture, revision ORIF     3 months since DOS on 12/24/20         WB status: {weightbearingstatus:46035}  PT/OT script provided: ***  Work/SNF/school note provided: ***  Follow-up: {follow up:15908} for re-evaluation  Anticipated imaging: {anticipateimaging:46034}     -***    - Discussed use of RICE and/or OTC medications PRN: ***  - Patient has been advised to follow-up sooner for persistent and/or worsening symptoms    CHIEF COMPLAINT     No chief complaint on file.      HPI   56 y.o. male presents for:    Failed fixation, left patella fracture  S/p Removal of hardware, left patella fracture, revision ORIF     3 months since DOS on 12/24/20     Patient reports that ***       PHYSICAL EXAM     Appearance:  Well developed, well nourished 57 y.o. male patient in NAD.    Respiratory:  Normal respiratory effort without distress.    Cardiovascular: Extremity warm and well perfused. Brisk cap refill.    Neurologic:  Sensation to light touch intact in distal extremity.    Musculoskeletal:  Left lower extremity - ***     RADIOLOGY     Plain films of the left knee 2V were reviewed and interpreted by me.     Date Imaged:  03/15/2021 ***     Date Reviewed:  03/15/2021     Location:  [facility]     Indication:  injury, pain       Interpretation: Hardware is present and appears to be in good position without evidence of loosening. Interval healing is noted to ***. Bony alignment appears stable compared to prior imaging.    ---    No results found.    DEMOGRAPHICS     Date and Time: 03/15/2021 12:43 PM  Patient Name: [name], [name]  DOB: [birth year]  Age: 26 y.o.   PCP: Tanja Port, MD    History provided by patient. ***    REVIEW OF SYSTEMS     ROS as noted in HPI    ---    This chart was generated with the use of an electronic medical record and may contain  errors not intended by the author.    ---  Durenda Hurt, [place]

## 2021-03-16 ENCOUNTER — Ambulatory Visit (INDEPENDENT_AMBULATORY_CARE_PROVIDER_SITE_OTHER): Payer: PRIVATE HEALTH INSURANCE

## 2021-03-18 ENCOUNTER — Ambulatory Visit (INDEPENDENT_AMBULATORY_CARE_PROVIDER_SITE_OTHER): Payer: PRIVATE HEALTH INSURANCE | Admitting: Medical

## 2021-03-18 ENCOUNTER — Ambulatory Visit
Admission: RE | Admit: 2021-03-18 | Discharge: 2021-03-18 | Disposition: A | Discharge status: Home / Self Care | Payer: PRIVATE HEALTH INSURANCE | Source: Ambulatory Visit | Attending: Medical | Admitting: Medical

## 2021-03-18 DIAGNOSIS — S82002A Unspecified fracture of left patella, initial encounter for closed fracture: Secondary | ICD-10-CM | POA: Insufficient documentation

## 2021-03-18 DIAGNOSIS — M25462 Effusion, left knee: Secondary | ICD-10-CM | POA: Insufficient documentation

## 2021-03-18 DIAGNOSIS — R600 Localized edema: Secondary | ICD-10-CM | POA: Insufficient documentation

## 2021-03-18 DIAGNOSIS — S82002D Unspecified fracture of left patella, subsequent encounter for closed fracture with routine healing: Secondary | ICD-10-CM

## 2021-03-18 NOTE — Progress Notes (Signed)
[facility] Orthopa[facility] Trauma  Office Note    ASSESSMENT/PLAN   56 y.o. male with:    Failed fixation, left patella fracture  S/p Removal of hardware, left patella fracture, revision ORIF     3 months since DOS on 12/24/20     WB status: WBAT, may discontinue hinged knee brace  Patient has been noncompliant and has not attended formal rehab or therapy, spouse reports that this was an insurance coverage issue and now that they have remedied the situation he is scheduled to start therapy on 22 February  PT/OT script provided: Pending physical therapy appointment on 22nd February  Follow-up: 6 to 8 weeks for re-evaluation  Anticipated imaging: Surveillance      - Patient has been advised to follow-up sooner for persistent and/or worsening symptoms    CHIEF COMPLAINT     Chief Complaint   Patient presents with    Follow-up     DOI: 12/04/20 & 12/06/20 DOS: 12/24/20 RECK L PATELLA S/P REV ORIF L PATELLA HX: Felt a "pop"       HPI   56 y.o. male presents for:    Failed fixation, left patella fracture  S/p Removal of hardware, left patella fracture, revision ORIF     3 months since DOS on 12/24/20     Patient reports that he is doing okay, denies any pain or radicular symptoms.  Radiographs were reviewed with patient today.  All questions answered.    Pain Score: 0-No pain    PHYSICAL EXAM     Appearance:  Well developed, well nourished 56 y.o. male patient in NAD.    Respiratory:  Normal respiratory effort without distress.    Cardiovascular: Extremity warm and well perfused. Brisk cap refill.    Neurologic:  Sensation to light touch intact in distal extremity.    Musculoskeletal:  Left lower extremity -SI LT, dorsiflexion plantarflexion intact, no pain with calf squeeze, operative incision well approximated with no evidence of discharge erythema or induration     RADIOLOGY     Plain films of the left knee 3V were reviewed and interpreted by me.     Date Imaged:  03/18/2021      Date Reviewed:  03/18/2021     Location:   [facility]     Indication:  injury, pain       Interpretation: Interval healing is noted to left patella, no interval changes.   ---    No results found.    DEMOGRAPHICS     Date and Time: 03/18/2021 11:22 AM  Patient Name: [name], [name]  DOB: [birth year]  Age: 89 y.o.   PCP: Tanja Port, MD    History provided by patient.     REVIEW OF SYSTEMS     ROS as noted in HPI    MDM/TIME SPENT     The time spent on the day of service in review of prior records and tests, face to face time with the patient, documenting, placing orders, independently analyzing results, conferring with other clinicians, care coordination, and communicating results as outlined above was 35 minutes.  ---    This chart was generated with the use of an electronic medical record and may contain errors not intended by the author.    ---  Dereck Ligas, [place], [place]

## 2021-04-07 ENCOUNTER — Ambulatory Visit
Admission: RE | Admit: 2021-04-07 | Discharge: 2021-04-07 | Disposition: A | Discharge status: Home / Self Care | Payer: PRIVATE HEALTH INSURANCE | Source: Ambulatory Visit | Attending: Medical | Admitting: Medical

## 2021-04-07 DIAGNOSIS — R262 Difficulty in walking, not elsewhere classified: Secondary | ICD-10-CM | POA: Insufficient documentation

## 2021-04-07 DIAGNOSIS — M25662 Stiffness of left knee, not elsewhere classified: Secondary | ICD-10-CM | POA: Insufficient documentation

## 2021-04-07 DIAGNOSIS — Z4789 Encounter for other orthopedic aftercare: Secondary | ICD-10-CM | POA: Insufficient documentation

## 2021-04-12 ENCOUNTER — Encounter (RURAL_HEALTH_CENTER): Payer: Self-pay

## 2021-04-12 ENCOUNTER — Ambulatory Visit: Payer: PRIVATE HEALTH INSURANCE | Attending: Internal Medicine | Admitting: Internal Medicine

## 2021-04-12 VITALS — BP 156/96 | HR 68 | Wt 250.4 lb

## 2021-04-12 DIAGNOSIS — E782 Mixed hyperlipidemia: Secondary | ICD-10-CM

## 2021-04-12 DIAGNOSIS — E559 Vitamin D deficiency, unspecified: Secondary | ICD-10-CM

## 2021-04-12 DIAGNOSIS — E1121 Type 2 diabetes mellitus with diabetic nephropathy: Secondary | ICD-10-CM

## 2021-04-12 DIAGNOSIS — Z794 Long term (current) use of insulin: Secondary | ICD-10-CM

## 2021-04-12 DIAGNOSIS — I10 Essential (primary) hypertension: Secondary | ICD-10-CM

## 2021-04-12 DIAGNOSIS — N184 Chronic kidney disease, stage 4 (severe): Secondary | ICD-10-CM

## 2021-04-12 DIAGNOSIS — E1165 Type 2 diabetes mellitus with hyperglycemia: Secondary | ICD-10-CM

## 2021-04-12 DIAGNOSIS — Z978 Presence of other specified devices: Secondary | ICD-10-CM

## 2021-04-12 MED ORDER — DEXCOM G6 TRANSMITTER MISC
3 refills | Status: DC
Start: ? — End: 2021-04-12

## 2021-04-12 MED ORDER — DEXCOM G6 RECEIVER DEVI
0 refills | Status: DC
Start: ? — End: 2021-04-12

## 2021-04-12 MED ORDER — FUROSEMIDE 40 MG PO TABS
40.0000 mg | ORAL_TABLET | Freq: Every day | ORAL | 1 refills | Status: DC
Start: ? — End: 2021-04-12

## 2021-04-12 MED ORDER — VICTOZA 18 MG/3ML SC SOPN
PEN_INJECTOR | SUBCUTANEOUS | 3 refills | Status: DC
Start: ? — End: 2021-04-12

## 2021-04-12 MED ORDER — BASAGLAR KWIKPEN 100 UNIT/ML SC SOPN
68.0000 [IU] | PEN_INJECTOR | Freq: Every day | SUBCUTANEOUS | 3 refills | Status: DC
Start: ? — End: 2021-04-12

## 2021-04-12 MED ORDER — DEXCOM G6 SENSOR MISC
1.0000 | 3 refills | Status: DC
Start: ? — End: 2021-04-12

## 2021-04-12 MED ORDER — INSULIN LISPRO (1 UNIT DIAL) 100 UNIT/ML SC SOPN
PEN_INJECTOR | SUBCUTANEOUS | 3 refills | Status: DC
Start: ? — End: 2021-04-12

## 2021-04-12 NOTE — Progress Notes (Signed)
Endocrine Provider Progress Note      Patient Name:  [name] [[id number]] DOB: 1965-03-21  Date: 04/12/2021    Subjective:          [name] is a 56 y.o. male who presents for follow up of type 2 diabetes mellitus.   Since the last visit, the patient had surgery for Charcot's joint of right foot at [facility] by Dr. Stefanie Libel, on 05/28/2020.  Since then, he fell and shattered his (L) knee cap with surgery and then repeat surgery on the knee cap.  He is now going to physical therapy at [facility].         He had previously been followed by nephrology (Dr. Dimas Millin) had lab work done 07/21/2020 and appointment 08/11/2020 and does not recall being seen since.  He states Dr Charlotta Newton added metoprolol through his visit at the [facility] to try to get his "blood pressure down and protect the kidneys."  Blood pressure is continue to run above target when he has been checking it at home.  He notes some edema.     Monitoring, Compliance and Complications:  Diagnosis Date: ~2000-2001  Prior Diabetic Treatment: Wilder Glade (discontinued due to renal function) patient reports Wilder Glade was stopped during hospitalization for osteomyelitis.   Hgb A1c at time of referral: 11.1% (done 12/20/16).  HGB A1C goal based on age and comorbidities is 7%.  The patient has the following diabetic complications:  retinopathy, nephropathy and neuropathy, history of foot ulcer.  He has had multiple surgeries for osteomyelitis on his left foot in 2017.     Glucose Meter: Reli-On, not sure which model  Glucose Meter Validation Date: never  the patient uses finger stick BG monitoring when CGM is not in use    .    Marland Kitchen    CGM Type:  Dexcom G6  Percent of time in target range: 66  For additional CGM data, see scanned CGM download attached to this visit note.   Average sensor glucose: 182  Percent of time above target range: 31% high; 3% very high  Percent of time below target range: 0%      CGM tracings from 03/30/21 to 04/12/21 were reviewed today.   Tracings show  overnight dropping at times.  See adjustments in regimen as detailed below.     Injections:   Injections are given by patient.  Pre-meal Novolog 6 units ACB; 6 units ACL; 10 units ACS plus adds correction scale 2 units every 50 points BG >150  Injections sites include: abdominal wall  Injection compliance: The patient never misses injections.  No problems noted with injection sites.      Diet, Exercise, and Weight:     The patient tries to watch portions, starches and sweets.  Compliance with diet has been variable.   Exercise: The patient is unable to exercise due to NWB on right foot.   Weight:  Stable since Feb 2022 visit.      Hypoglycemia:   The patient has experienced mild occasional hypoglycemia since the last visit. Symptoms of hypoglycemia include: hunger and shaking, blurred vision, nausea. . The patient treats hypoglycemia with carb snack, glucose tablets     The patient has Nasal Glucagon (Baqsimi). This is up-to-date and family members have been instructed in its use.   The patient does not have a medical alert bracelet or necklace.    Diabetic Education: The patient has received diabetes education, most recently 08/13/2020.  Diabetic Complication Screening/Treatment  Eye exam: Dr. Deirdre Pippins. Last visit was 03/17/2020.  No evidence of diabetic retinopathy on last eye exam.  Urine microalbumin/creatinine ratio: N/A. The patient has CKD followed by nephrologist.    Foot Care: The patient reports continued problems with  neuropathic pain  Has followed with Podiatry; Dr. Nada Boozer   Monofilament:  03/30/2020, not intact   Aspirin Therapy: The patient is currently taking aspirin at dose indicated in the medication list.   Cardiovascular risk factors: advanced age (older than 48 for men, 97 for women), diabetes mellitus, dyslipidemia, hypertension, male gender, obesity (BMI >= 30 kg/m2), sedentary lifestyle and smoking/ tobacco exposure  Ace Inhibitor/ARB: ACE-I/ARB is contraindicated due to   CKD.  Dental care: Patient has received regular dental care, most recently Feb-March 2021  Had 4 teeth extracted    Current Outpatient Medications   Medication Sig Dispense Refill    amLODIPine (NORVASC) 5 MG tablet TAKE 1 TABLET (5 MG TOTAL) BY MOUTH DAILY. 30 tablet 11    aspirin EC 81 MG EC tablet Take 1 tablet (81 mg total) by mouth every evening. 90 tablet 3    atorvastatin (LIPITOR) 40 MG tablet TAKE 1 TABLET BY MOUTH ONCE DAILY 90 tablet 2    Blood Glucose Monitoring Suppl (RELION PRIME MONITOR) Device Use USE TO TEST BLOOD GLUCOSE FOUR TIMES DAILY      calcium carbonate (OS-CAL) 600 MG Tab tablet Take 1 tablet (600 mg) by mouth daily      Continuous Blood Gluc Sensor (FreeStyle Libre 14 Day Sensor) Misc Use 1 each every 14 (fourteen) days 6 each 1    gabapentin (NEURONTIN) 300 MG capsule TAKE 1 CAPSULE BY MOUTH THREE TIMES DAILY 270 capsule 0    glucose blood test strip 1 each by Other route 4 (four) times daily Use as instructed      Insulin Pen Needle 31G X 6 MM Misc Use Use up to 7 times a day as directed      metoprolol tartrate (LOPRESSOR) 50 MG tablet Take 50 mg by mouth 2 (two) times daily      Multiple Vitamin (multivitamin) capsule Take 1 capsule by mouth daily      Vitamin D3 (CHOLECALCIFEROL) 50 MCG (2000 UT) tablet Take 1 tablet (50 mcg total) by mouth daily      furosemide (Lasix) 40 MG tablet Take 1 tablet (40 mg) by mouth daily 30 tablet 1    insulin glargine (BASAGLAR KWIKPEN) 100 UNIT/ML injection pen Inject 68 Units into the skin daily 75 mL 3    insulin lispro, 1 Unit Dial, (HumaLOG KwikPen) 100 UNIT/ML Solution Pen-injector injection pen 6 units breakfast and lunch, 10 units supper + correction up to 50 units per day 45 mL 3    liraglutide (Victoza) 18 MG/3ML injection INJECT 1.8MG  SUBCUTANEOUSLY ONCE DAILY 27 mL 3     No current facility-administered medications for this visit.      Medication review with [name] suggested compliance all of the time.       Immunization History    Administered Date(s) Administered    Td 06/14/2009    Td,preservative free 06/14/2009    Tdap 07/06/2011     The following portions of the patient's history were reviewed and updated as appropriate: current medications, past medical history and problem list.  The following information was also reviewed at today's visit: lab data and continuous glucose sensor download data    Review of Systems  As above.  Objective:      Vital Signs: BP (!) 156/96 (BP Site: Left arm, Patient Position: Sitting, Cuff Size: Large)   Pulse 68   Wt 113.6 kg (250 lb 6.4 oz)   BMI 40.42 kg/m      PE  Well nourished, well appearing, in no acute distress.  HEENT:  There is a slight stare.  There is no lid lag. There is no periorbital edema. Extraoccular movements are intact. No conjunctival injection.  oral mucosa not examined. Patient wearing mask.  Neck is supple without adenopathy.  The thyroid is normal in size and consistency and no nodules are appreciated. The thyroid is normal on inspection.  Lungs are clear to auscultation.  Cardiac exam reveals a regular rate and rhythm. No murmur or gallop is appreciated.      Exam of the remedies reveals +1 pitting edema at the ankle bilaterally.Marland Kitchen     Posterior tibial and doralis pedis pulses are normal.on left foot  Callouses are noted heels. Dry skin  is noted on left plantar/dorsal surface Onychomycosis is noted on nails of left foot .  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  On neurologic exam, the patient is alert and appropriate. Gait and speech are normal.   Skin is warm and dry.     Lab Review    Lab Results   Component Value Date    HGBA1CPERCNT 7.2 01/27/2021    HGBA1CPERCNT 7.6 08/07/2020    HGBA1CPERCNT 8.4 08/06/2020    TSH 1.33 10/19/2019    CHOL 182 01/27/2021    HDL 32 (L) 01/27/2021    LDL  01/27/2021     Unable to calculate due to triglycerides >400 mg/dl    TRIG 707 (H) 01/27/2021    ALT 27 09/14/2020    B12 419 09/13/2017    VITD 14.5 (L) 07/21/2020    GLU 226 (H)  01/27/2021    K 4.1 01/27/2021    CA 9.0 01/27/2021    CREAT 2.96 (H) 01/27/2021    CO2 23.4 01/27/2021    EGFR 24 (L) 01/27/2021    NA 140 01/27/2021    ALKPHOS 162 (H) 09/14/2020       03/09/19 vitamin D2/D3 total 21 (normal 20-50)    Assessment:      1. Type 2 diabetes mellitus with hyperglycemia, with long-term current use of insulin  insulin glargine (BASAGLAR KWIKPEN) 100 UNIT/ML injection pen    liraglutide (Victoza) 18 MG/3ML injection    Hemoglobin A1C    Ambulatory referral to Diabetic Education      2. Diabetic nephropathy associated with type 2 diabetes mellitus  Ambulatory referral to Nephrology    Renal function panel    PTH, Intact      3. Stage 4 chronic kidney disease        4. Mixed hyperlipidemia  Lipid panel    ALT      5. CGM Dex Com        6. Essential hypertension        7. Vitamin D deficiency  Vitamin D,25 OH, Total        Diabetes: Diabetes is suboptimally controlled. Discussed specific areas of diabetes management as detailed under patient instructions below.  Sent a new prescription for a Dexcom sensor since he found this more helpful than the Elenor Legato he is currently using and with his new insurance that should be covered.     Lipids: We have reviewed that the triglycerides are above goal. We have discussed that  they are likely to improve with decreased intake of simple sugars and improved diabetic control.  Ask him to meet with the diabetic educator to review changes in diet that will help this.    CKD with uncontrolled HTN and edema: will add furosemide 40mg  daily until he sees renal.  I have referred him to renal to try to expedite his follow-up there.  I will also order the usual renal labs for them so they will be available at his appointment.    Vitamin D deficiency: We will was low when last checked in June 2022 we will check this with his upcoming diabetic labs.          Plan:   Below is a summary of information and instructions we reviewed at today's appointment. Please review this  information carefully and call if you have any questions regarding it.     Diabetes information/instructions:   Labwork: You are due for lab work  now.  You should fast for 10 hours prior to having this labwork done.    Self-monitoring of blood glucose: We recommend that you monitor your blood glucose before breakfast, lunch, dinner and bedtime, and as needed for signs and/or symptoms of hypoglycemia. Please fax, email, drop off or MyChartMessage your blood sugar readings for further adjustment of regimen  if at anytime you notice too many high or low blood sugar readings  Sharing your CGM data between visits:  We can make more progress helping you improve blood sugar control if we review your CGM data between visits. Please let us know when you have uploaded your device so we can review your data each time.   To share data from a DexCom CGM: please provide the office with the email account associated with your Dexcom.  We will send you an email invitation to share your data.  Please then log into your Dexcom account to accept the invitation, consent to share data with the clinic, and tap "Yes, Share My Data."   Important general information about using a continuous glucose sensor (CGM): Do not inject insulin within 1-3 inches of the sensor. Remove sensor if you are having MRI, CT, X-ray. Rotate sensor sites. If you stop wearing your CGM for a period of time, please resume fingerstick blood sugar checks at least 4 times a day.   Please contact Dexcom for help with troubleshooting.     Please scan your continuous glucose monitor sensor regularly.    Diabetic Medication/Insulin Instructions:  Continue your Basaglar insulin dose at 68 units once a day      Increase your Novolog before breakfast  6 units, and continue  6 units before lunch and 12 units before supper, and use the correction scale:    If your before meal blood glucose is:  150 - 200, add 2 units Novolog  201- 250, add 4 units Novolog  251-300,  add 6  units Novolog  301 - 350, add 8 units Novolog  351 - 400, add 10 units Novolog and notify the office    We will plan to review your continuous data again in four weeks.  .  Low Blood Sugar Prevention and Treatment:   Please carry fast-acting carbohydrate source (preferably glucose tablets) with you at all times. Please treat low blood sugar with 15 grams carbohydrate (4 glucose tablets), wait 15 minutes, and recheck sugar.  Re-treat if necessary.   Please notify office for any episode of severe hypoglycemia, such as those requiring assistance from someone  else.   .  Diet:   Please aim for a consistent carbohydrate intake at each meal. Please try to avoid missing meals.  Stay hydrated with water.    Activity:   Per physical therapy    Foot Care:   Please examine feet daily and appropriately treat callouses or lesions. Please avoid being barefoot or wearing of ill fitting shoes. Please use lotion as needed to treat dry skin on feet.  Please follow up with your podiatrist regarding recent foot surgery, routine foot care    Diabetic Education:   We did not discuss diabetic education today. If you would like to follow up with a diabetic educator, please call the office and we will arrange for this    Screening for Health Complications of Diabetes:   Diabetic screening is up-to-date except for a dental exam. Please schedule an appointment with your dentist.  Free dental cleanings are available through the [facility] Dental hygeine program. [phone] You may wish to consider sedation dentistry. One provider is Dr. Burnadette Peter at [facility], [phone].    The following additional instructions relate to health conditions other than your diabetes:   Cholesterol/lipids: Your cholesterol is in good range but your triclyerides are elevated and your HDL ("good" cholesterol") is low. We have reviewed that decreased intake of "simple" sugars (including fruit, fruit juice, other sweet drinks and sugary foods) can help to  improve your triglycerides. Increased exercise can help raise your HDL.     Blood Pressure: Your blood pressure is above goal today.  Please monitor your blood pressure regularly. Your blood pressure goal is 130/80.  Take your blood pressure and take readings to appt with kidney doctor. I will add furosemide 40 mg daily for now. I have sent a new referral to the kidney specialist   Please follow up with your primary care provider regarding routine health screenings         Return in about 3 months (around 07/10/2021).      Briscoe Burns, MD     Patient seen with Merryl Hacker, RN, CDCES

## 2021-04-12 NOTE — Patient Instructions (Signed)
Below is a summary of information and instructions we reviewed at today's appointment. Please review this information carefully and call if you have any questions regarding it.     Diabetes information/instructions:   Labwork: You are due for lab work  now.  You should fast for 10 hours prior to having this labwork done.    Self-monitoring of blood glucose: We recommend that you monitor your blood glucose before breakfast, lunch, dinner and bedtime, and as needed for signs and/or symptoms of hypoglycemia. Please fax, email, drop off or MyChartMessage your blood sugar readings for further adjustment of regimen  if at anytime you notice too many high or low blood sugar readings  Sharing your CGM data between visits:  We can make more progress helping you improve blood sugar control if we review your CGM data between visits. Please let us know when you have uploaded your device so we can review your data each time.   To share data from a DexCom CGM: please provide the office with the email account associated with your Dexcom.  We will send you an email invitation to share your data.  Please then log into your Dexcom account to accept the invitation, consent to share data with the clinic, and tap "Yes, Share My Data."   Important general information about using a continuous glucose sensor (CGM): Do not inject insulin within 1-3 inches of the sensor. Remove sensor if you are having MRI, CT, X-ray. Rotate sensor sites. If you stop wearing your CGM for a period of time, please resume fingerstick blood sugar checks at least 4 times a day.   Please contact Dexcom for help with troubleshooting.     Please scan your continuous glucose monitor sensor regularly.    Diabetic Medication/Insulin Instructions:  Continue your Basaglar insulin dose at 68 units once a day     Increase your Novolog before breakfast  6 units, and continue  6 units before lunch and 12 units before supper, and use the correction scale:    If your before  meal blood glucose is:  150 - 200, add 2 units Novolog  201- 250, add 4 units Novolog  251-300,  add 6 units Novolog  301 - 350, add 8 units Novolog  351 - 400, add 10 units Novolog and notify the office    We will plan to review your continuous data again in four weeks.  .  Low Blood Sugar Prevention and Treatment:   Please carry fast-acting carbohydrate source (preferably glucose tablets) with you at all times. Please treat low blood sugar with 15 grams carbohydrate (4 glucose tablets), wait 15 minutes, and recheck sugar.  Re-treat if necessary.   Please notify office for any episode of severe hypoglycemia, such as those requiring assistance from someone else.   .  Diet:   Please aim for a consistent carbohydrate intake at each meal. Please try to avoid missing meals.  Stay hydrated with water.    Activity:   Per physical therapy    Foot Care:   Please examine feet daily and appropriately treat callouses or lesions. Please avoid being barefoot or wearing of ill fitting shoes. Please use lotion as needed to treat dry skin on feet.  Please follow up with your podiatrist regarding recent foot surgery, routine foot care    Diabetic Education:   We did not discuss diabetic education today. If you would like to follow up with a diabetic educator, please call the office and we will arrange  for this    Screening for Health Complications of Diabetes:   Diabetic screening is up-to-date except for a dental exam. Please schedule an appointment with your dentist.  Free dental cleanings are available through the [facility] Dental hygeine program. [phone] You may wish to consider sedation dentistry. One provider is Dr. Burnadette Peter at [facility], [phone].    The following additional instructions relate to health conditions other than your diabetes:   Cholesterol/lipids: Your cholesterol is in good range but your triclyerides are elevated and your HDL ("good" cholesterol") is low. We have reviewed that decreased  intake of "simple" sugars (including fruit, fruit juice, other sweet drinks and sugary foods) can help to improve your triglycerides. Increased exercise can help raise your HDL.     Blood Pressure: Your blood pressure is above goal today.  Please monitor your blood pressure regularly. Your blood pressure goal is 130/80.  Take your blood pressure and take readings to appt with kidney doctor. I will add furosemide 40 mg daily for now. I have sent a new referral to the kidney specialist   Please follow up with your primary care provider regarding routine health screenings

## 2021-04-14 ENCOUNTER — Ambulatory Visit
Admission: RE | Admit: 2021-04-14 | Discharge: 2021-04-14 | Disposition: A | Discharge status: Home / Self Care | Payer: PRIVATE HEALTH INSURANCE | Source: Ambulatory Visit | Attending: Medical | Admitting: Medical

## 2021-04-14 DIAGNOSIS — Z4789 Encounter for other orthopedic aftercare: Secondary | ICD-10-CM | POA: Insufficient documentation

## 2021-04-14 DIAGNOSIS — M25662 Stiffness of left knee, not elsewhere classified: Secondary | ICD-10-CM | POA: Insufficient documentation

## 2021-04-14 DIAGNOSIS — R262 Difficulty in walking, not elsewhere classified: Secondary | ICD-10-CM | POA: Insufficient documentation

## 2021-04-15 ENCOUNTER — Encounter (RURAL_HEALTH_CENTER): Payer: Self-pay

## 2021-04-15 NOTE — Progress Notes (Signed)
Zollie Lovena Le KeyRomilda Joy - PA Case ID: [id number] - Rx #: [id number] Need help? Call us at [phone]  Status  Sent to [name] pen-injectors  Form  Civil Service fast streamer PA Form (2017 NCPDP)  Original Claim Info  75 Prior Authorization Required PA REQUIRED - Call [phone] or log on to [email or web address] to initiate exception request.;Prior Authorization Required;;FOR [name] CALL [phone]

## 2021-04-15 NOTE — Progress Notes (Signed)
[name] Le KeyRomilda Joy - PA Case ID: [id number] - Rx #: [id number] Need help? Call us at [phone]  Outcome  Approvedtoday  PA Case: [id number], Status: Approved, Coverage Starts on: 04/15/2021 12:00:00 AM, Coverage Ends on: 04/15/2022 12:00:00 AM.  Drug  Victoza 18MG [name] pen-injectors  Form  Civil Service fast streamer PA Form (2017 NCPDP)  Original Claim Info  75 Prior Authorization Required PA REQUIRED - Call [phone] or log on to [email or web address] to initiate exception request.;Prior Authorization Required;;FOR [name] CALL [phone]

## 2021-04-18 ENCOUNTER — Other Ambulatory Visit (RURAL_HEALTH_CENTER): Payer: Self-pay | Admitting: Internal Medicine

## 2021-04-18 NOTE — Telephone Encounter (Signed)
Call the [facility] and let them know that he is requesting a refill on his Rx.  Let me know what they say.

## 2021-04-19 ENCOUNTER — Other Ambulatory Visit (RURAL_HEALTH_CENTER): Payer: Self-pay

## 2021-04-19 MED ORDER — FREESTYLE LIBRE 14 DAY SENSOR MISC
1.0000 | 2 refills | Status: DC
Start: ? — End: 2021-04-19

## 2021-04-19 NOTE — Telephone Encounter (Signed)
New script pended to [facility]

## 2021-04-19 NOTE — Telephone Encounter (Signed)
Pt is going to stick with [facility]

## 2021-04-19 NOTE — Telephone Encounter (Signed)
Pt wants to [facility] pharmacy

## 2021-04-19 NOTE — Telephone Encounter (Signed)
Voicemail left for free clinic to return call - Fatima Sanger 04/19/2021 2:09 PM

## 2021-04-20 NOTE — Telephone Encounter (Signed)
Then he needs to make an appointment

## 2021-04-20 NOTE — Telephone Encounter (Signed)
Routed to front staff - Fatima Sanger 04/20/2021 3:05 PM

## 2021-04-20 NOTE — Telephone Encounter (Signed)
Received voicemail from [place] at the free clinic noting patient was last seen there in December by Dr. Charlotta Newton and received this script at that time. She says patient had advised them he would be following with Dr. Charlotta Newton here in our office, so they were under the impression he was no longer their patient.  Anders Grant 04/20/2021 2:26 PM

## 2021-04-20 NOTE — Telephone Encounter (Signed)
Lvm to schedule

## 2021-04-22 ENCOUNTER — Ambulatory Visit
Admission: RE | Admit: 2021-04-22 | Discharge: 2021-04-22 | Disposition: A | Discharge status: Home / Self Care | Payer: PRIVATE HEALTH INSURANCE | Source: Ambulatory Visit | Attending: Internal Medicine | Admitting: Internal Medicine

## 2021-04-22 DIAGNOSIS — Z794 Long term (current) use of insulin: Secondary | ICD-10-CM | POA: Insufficient documentation

## 2021-04-22 DIAGNOSIS — E782 Mixed hyperlipidemia: Secondary | ICD-10-CM | POA: Insufficient documentation

## 2021-04-22 DIAGNOSIS — E559 Vitamin D deficiency, unspecified: Secondary | ICD-10-CM | POA: Insufficient documentation

## 2021-04-22 DIAGNOSIS — E1165 Type 2 diabetes mellitus with hyperglycemia: Secondary | ICD-10-CM | POA: Insufficient documentation

## 2021-04-22 DIAGNOSIS — E1121 Type 2 diabetes mellitus with diabetic nephropathy: Secondary | ICD-10-CM | POA: Insufficient documentation

## 2021-04-22 DIAGNOSIS — N184 Chronic kidney disease, stage 4 (severe): Secondary | ICD-10-CM | POA: Insufficient documentation

## 2021-04-22 LAB — RENAL FUNCTION PANEL
Albumin: 2.5 gm/dL — ABNORMAL LOW (ref 3.5–5.0)
Anion Gap: 15.3 mMol/L (ref 7.0–18.0)
BUN / Creatinine Ratio: 10.5 Ratio (ref 10.0–30.0)
BUN: 36 mg/dL — ABNORMAL HIGH (ref 7–22)
CO2: 24.6 mMol/L (ref 20.0–30.0)
Calcium: 9 mg/dL (ref 8.5–10.5)
Chloride: 107 mMol/L (ref 98–110)
Creatinine: 3.42 mg/dL — ABNORMAL HIGH (ref 0.80–1.30)
EGFR: 20 mL/min/{1.73_m2} — ABNORMAL LOW (ref 60–150)
Glucose: 148 mg/dL — ABNORMAL HIGH (ref 71–99)
Osmolality Calculated: 294 mOsm/kg (ref 275–300)
Phosphorus: 4.1 mg/dL (ref 2.3–4.7)
Potassium: 4.9 mMol/L (ref 3.5–5.3)
Sodium: 142 mMol/L (ref 136–147)

## 2021-04-22 LAB — ALT: ALT: 33 U/L (ref 0–55)

## 2021-04-22 LAB — PROTEIN / CREATININE RATIO, URINE
Protein/Creatinine Ratio: 11.82 Ratio — ABNORMAL HIGH (ref 0.00–0.09)
Urine Creatinine Random: 53 mg/dL
Urine Protein: 626.4 mg/dL — ABNORMAL HIGH (ref 0.0–14.0)

## 2021-04-22 LAB — LIPID PANEL
Cholesterol: 191 mg/dL (ref 75–199)
Coronary Heart Disease Risk: 5.79
HDL: 33 mg/dL — ABNORMAL LOW (ref 40–55)
LDL Calculated: UNDETERMINED mg/dL
Triglycerides: 438 mg/dL — ABNORMAL HIGH (ref 10–150)
VLDL: UNDETERMINED (ref 0–40)

## 2021-04-22 LAB — HEMOGLOBIN A1C
Estimated Average Glucose: 163 mg/dL
Hgb A1C, %: 7.3 %

## 2021-04-22 LAB — PTH, INTACT: PTH Intact: 35.5 pg/mL (ref 20.0–83.0)

## 2021-04-23 ENCOUNTER — Other Ambulatory Visit (RURAL_HEALTH_CENTER): Payer: Self-pay

## 2021-04-23 DIAGNOSIS — E559 Vitamin D deficiency, unspecified: Secondary | ICD-10-CM

## 2021-04-23 LAB — VITAMIN D,25 OH,TOTAL: Vitamin D 25-Hydroxy: 17.6 ng/mL — ABNORMAL LOW (ref 30.0–80.0)

## 2021-04-23 MED ORDER — ERGOCALCIFEROL 1.25 MG (50000 UT) PO CAPS
ORAL_CAPSULE | ORAL | 2 refills | Status: DC
Start: ? — End: 2021-04-23

## 2021-04-23 NOTE — Progress Notes (Signed)
Please call the patient with the following information:  See other result note.  Did not realize this 1 was not back yet.  Please let him know vitamin D remains very low.  Is he currently taking any vitamin D at all and if so how much so I can recommend an increase?  This note was generated using speech recognition software and may contain unintended errors in grammar, spelling or content. Please ask me for clarification if questions about its content prior to calling patient.

## 2021-04-23 NOTE — Telephone Encounter (Signed)
Please send Vit D Rx to [facility] in [place]

## 2021-04-23 NOTE — Progress Notes (Signed)
Please call the patient with the following information:  A1c is stable, triglycerides are still high but improved from what they were back in December.  Urine protein level is still very high and kidney function is a bit worse.  I want to be sure that he has a follow-up scheduled with his nephrologist soon.  I believe I already asked someone to fax his renal panel to her his nephrologist but if not can you fax that along with the urine protein result?  Thanks  This note was generated using speech recognition software and may contain unintended errors in grammar, spelling or content. Please ask me for clarification if questions about its content prior to calling patient.

## 2021-04-27 ENCOUNTER — Other Ambulatory Visit (INDEPENDENT_AMBULATORY_CARE_PROVIDER_SITE_OTHER): Payer: Self-pay | Admitting: Medical

## 2021-04-27 DIAGNOSIS — S82002D Unspecified fracture of left patella, subsequent encounter for closed fracture with routine healing: Secondary | ICD-10-CM

## 2021-04-27 NOTE — Progress Notes (Signed)
[facility]  Office Note    ASSESSMENT/PLAN   56 y.o. male with:    Failed fixation, left patella fracture  S/p Removal of hardware, left patella fracture, revision ORIF     4 months since DOS on 12/24/20         WB status: WBAT LLE  PT/OT script provided: NO  Work/SNF/school note provided: NO  Follow-up: prn for re-evaluation, discussed scheduling or just calling since patient lives far away, he prefers PRN f/u at this time. He will continue PT.   Anticipated imaging: None          - Discussed use of RICE and/or OTC medications PRN: NO  - Patient has been advised to follow-up sooner for persistent and/or worsening symptoms    CHIEF COMPLAINT     Chief Complaint   Patient presents with    Follow-up     DOI: 12/04/20 & 12/06/20 DOS: 12/24/20 RECK L PATELLA S/P REV ORIF L PATELLA HX: Felt a "pop       HPI   56 y.o. male presents for:    Failed fixation, left patella fracture  S/p Removal of hardware, left patella fracture, revision ORIF     4 months since DOS on 12/24/20     Patient reports that he is doing ok, making progress with PT (there was delay in initiation). We discussed this may lead to some limited ROM at the end of his rehab, he understands.        PHYSICAL EXAM     Appearance:  Well developed, well nourished 56 y.o. male patient in NAD.    Respiratory:  Normal respiratory effort without distress.    Cardiovascular: Extremity warm and well perfused. Brisk cap refill.    Neurologic:  Sensation to light touch intact in distal extremity.    Musculoskeletal:  Left lower extremity - Well healed incision without sign of infection. Able to extend knee and flex to about 90 degrees.      RADIOLOGY     Plain films of the left knee 2V were reviewed and interpreted by me.     Date Imaged:  04/28/2021      Date Reviewed:  04/28/2021     Location:  [facility]     Indication:  injury, pain       Interpretation: Interval healing is noted to fx site, still with diastasis, reviewed with patient. Bony alignment  appears stable compared to prior imaging.    ---    No results found.    DEMOGRAPHICS     Date and Time: 04/28/2021 11:17 AM  Patient Name: [name], [name]  DOB: 06/25/1965  Age: 74 y.o.   PCP: Tanja Port, MD    History provided by patient.     REVIEW OF SYSTEMS     ROS as noted in HPI    MDM/TIME SPENT     The time spent on the day of service in review of prior records and tests, face to face time with the patient, documenting, placing orders, independently analyzing results, conferring with other clinicians, care coordination, and communicating results as outlined above was 25 minutes.        ---    This chart was generated with the use of an electronic medical record and may contain errors not intended by the author.    ---  Durenda Hurt, [place]

## 2021-04-28 ENCOUNTER — Encounter (INDEPENDENT_AMBULATORY_CARE_PROVIDER_SITE_OTHER): Payer: Self-pay | Admitting: Medical

## 2021-04-28 ENCOUNTER — Ambulatory Visit (INDEPENDENT_AMBULATORY_CARE_PROVIDER_SITE_OTHER): Payer: PRIVATE HEALTH INSURANCE | Admitting: Medical

## 2021-04-28 ENCOUNTER — Ambulatory Visit
Admission: RE | Admit: 2021-04-28 | Discharge: 2021-04-28 | Disposition: A | Discharge status: Home / Self Care | Payer: PRIVATE HEALTH INSURANCE | Source: Ambulatory Visit | Attending: Medical | Admitting: Medical

## 2021-04-28 ENCOUNTER — Encounter (RURAL_HEALTH_CENTER): Payer: Self-pay | Admitting: Internal Medicine

## 2021-04-28 DIAGNOSIS — S82002D Unspecified fracture of left patella, subsequent encounter for closed fracture with routine healing: Secondary | ICD-10-CM | POA: Insufficient documentation

## 2021-05-06 ENCOUNTER — Other Ambulatory Visit (RURAL_HEALTH_CENTER): Payer: Self-pay

## 2021-05-06 NOTE — Telephone Encounter (Signed)
Pharmacy called and basaglar not covered they want lantus or levemir and humlog is not covered they want novoog or fiasp

## 2021-05-06 NOTE — Telephone Encounter (Signed)
Med pending  pt aware and understands

## 2021-05-07 MED ORDER — LANTUS SOLOSTAR 100 UNIT/ML SC SOPN
PEN_INJECTOR | SUBCUTANEOUS | 0 refills | Status: DC
Start: ? — End: 2021-05-07

## 2021-05-07 MED ORDER — NOVOLOG FLEXPEN 100 UNIT/ML SC SOPN
PEN_INJECTOR | SUBCUTANEOUS | 0 refills | Status: DC
Start: ? — End: 2021-05-07

## 2021-05-15 ENCOUNTER — Ambulatory Visit
Admission: RE | Admit: 2021-05-15 | Discharge: 2021-05-15 | Disposition: A | Discharge status: Home / Self Care | Payer: PRIVATE HEALTH INSURANCE | Source: Ambulatory Visit | Attending: Medical | Admitting: Medical

## 2021-05-15 DIAGNOSIS — M25662 Stiffness of left knee, not elsewhere classified: Secondary | ICD-10-CM | POA: Insufficient documentation

## 2021-05-15 DIAGNOSIS — R262 Difficulty in walking, not elsewhere classified: Secondary | ICD-10-CM | POA: Insufficient documentation

## 2021-05-15 DIAGNOSIS — Z4789 Encounter for other orthopedic aftercare: Secondary | ICD-10-CM | POA: Insufficient documentation

## 2021-06-17 ENCOUNTER — Encounter (RURAL_HEALTH_CENTER): Payer: Self-pay | Admitting: Family Medicine

## 2021-06-17 ENCOUNTER — Ambulatory Visit: Payer: PRIVATE HEALTH INSURANCE | Attending: Family Medicine | Admitting: Family Medicine

## 2021-06-17 VITALS — BP 140/72 | HR 83 | Temp 97.8°F | Resp 17 | Ht 66.0 in | Wt 262.4 lb

## 2021-06-17 DIAGNOSIS — Z6841 Body Mass Index (BMI) 40.0 and over, adult: Secondary | ICD-10-CM

## 2021-06-17 DIAGNOSIS — N184 Chronic kidney disease, stage 4 (severe): Secondary | ICD-10-CM

## 2021-06-17 DIAGNOSIS — E1140 Type 2 diabetes mellitus with diabetic neuropathy, unspecified: Secondary | ICD-10-CM

## 2021-06-17 DIAGNOSIS — E66813 Obesity, class 3: Secondary | ICD-10-CM | POA: Insufficient documentation

## 2021-06-17 DIAGNOSIS — Z794 Long term (current) use of insulin: Secondary | ICD-10-CM

## 2021-06-17 DIAGNOSIS — R69 Illness, unspecified: Secondary | ICD-10-CM | POA: Insufficient documentation

## 2021-06-17 DIAGNOSIS — E559 Vitamin D deficiency, unspecified: Secondary | ICD-10-CM

## 2021-06-17 DIAGNOSIS — Z125 Encounter for screening for malignant neoplasm of prostate: Secondary | ICD-10-CM

## 2021-06-17 MED ORDER — GABAPENTIN 300 MG PO CAPS
300.0000 mg | ORAL_CAPSULE | Freq: Three times a day (TID) | ORAL | 1 refills | Status: AC
Start: 2021-06-17 — End: ?

## 2021-06-17 MED ORDER — VITAMIN D 50 MCG (2000 UT) PO TABS
50.0000 ug | ORAL_TABLET | Freq: Every day | ORAL | Status: DC
Start: 2021-06-17 — End: 2021-09-13

## 2021-06-17 NOTE — Progress Notes (Incomplete)
[facility]  [address]  [place], [place] [address]  Phone [phone]   Fax [phone]    06/17/2021     Patient:   [name]                                                                                                             DOB:       [birth year]                                        Provider Name:  Rhunette Croft, MD     SUBJECTIVE       HPI:  [name] is a 57 y.o. male who is being seen today for   Chief Complaint   Patient presents with   . Establish Care           ROS:  Review of Systems    All other systems reviewed and negative except as above, pertinent findings in HPI.    Patient Active Problem List   Diagnosis   . Type 2 diabetes mellitus with hyperglycemia, with long-term current use of insulin   . Carpal tunnel syndrome of right wrist   . Essential hypertension   . History of kidney injury   . Mixed hyperlipidemia   . Diabetic foot ulcer   . Diabetic nephropathy   . Diabetic polyneuropathy associated with type 2 diabetes mellitus   . Chronic left shoulder pain   . Pain in lower jaw   . Muscle cramps   . Acute non-recurrent maxillary sinusitis   . Stage 4 chronic kidney disease   . Preoperative cardiovascular examination   . Morbid obesity   . CGM Dex Com   . Closed displaced transverse fracture of left patella   . Left patella fracture   . S/P ORIF (open reduction internal fixation) fracture   . Backache   . Charcot's joint of foot, right   . Dental caries into pulp   . Diabetic retinopathy   . GERD (gastroesophageal reflux disease)   . Vitamin D deficiency   . Illness, unspecified      Social History     Tobacco Use   Smoking Status Some Days   . Packs/day: 0.25   . Types: Cigarettes   . Last attempt to quit: 09/06/2017   . Years since quitting: 3.7   Smokeless Tobacco Never   Tobacco Comments    3 packs  a week   Vaping Use   Vaping Status Never Used      Social History     Substance and Sexual Activity   Alcohol Use No   . Alcohol/week: 0.0 standard drinks of alcohol    Comment: in past      Allergies   Allergen Reactions   . Influenza Virus Vaccine Anaphylaxis     Current Outpatient Medications   Medication  Instructions   . aspirin EC 81 mg, Oral, Every evening   . atorvastatin (LIPITOR) 40 MG tablet TAKE 1 TABLET BY MOUTH ONCE DAILY   . calcium carbonate (OS-CAL) 600 mg, Oral, Daily   . Continuous Blood Gluc Sensor (FreeStyle Libre 14 Day Sensor) Misc 1 each, Does not apply, Every 14 days   . continuous blood glucose receiver (DEXCOM G6) device Use as directed per manufacturer instructions   . continuous blood glucose sensor (DEXCOM G6) device 1 Device, Subcutaneous, EVERY 10 DAYS   . continuous blood glucose transmitter (DEXCOM G6) device Use as directed every 90 days per manufacturer instructions   . ergocalciferol (ERGOCALCIFEROL) 1.25 MG (50000 UT) capsule One capsule weekly x4 weeks, then one capsule every other week   . furosemide (LASIX) 40 mg, Oral, Daily   . gabapentin (NEURONTIN) 300 MG capsule TAKE 1 CAPSULE BY MOUTH THREE TIMES DAILY   . glucose blood test strip 1 each, Other, 4 times daily, Use as instructed    . insulin aspart (NovoLOG FlexPen) 100 UNIT/ML injection pen 6 units breakfast and lunch, 10 units supper + correction up to 50 units per day   . insulin glargine (LANTUS SOLOSTAR) 100 UNIT/ML injection pen Inject 68 Units into the skin daily   . Insulin Pen Needle 31G X 6 MM Misc Does not apply, Use up to 7 times a day as directed    . liraglutide (Victoza) 18 MG/3ML injection INJECT 1.8MG  SUBCUTANEOUSLY ONCE DAILY   . metoprolol tartrate (LOPRESSOR) 50 MG tablet Take 1 tablet by mouth twice daily   . Multiple Vitamins-Minerals (Multi For Him 50+) Tab Oral   . Vitamin D3 (CHOLECALCIFEROL) 50 mcg, Oral, Daily        PHYSICAL EXAM     Vital Signs:  BP 140/72 (BP Site: Left arm, Patient  Position: Sitting, Cuff Size: Large)   Pulse 83   Temp 97.8 F (36.6 C) (Tympanic)   Resp 17   Ht 1.676 m (5\' 6" )   Wt 119 kg (262 lb 6.4 oz)   SpO2 96%   BMI 42.35 kg/m   Physical Exam  Vitals reviewed.   Constitutional:       General: He is not in acute distress.     Appearance: Normal appearance. He is not ill-appearing.   HENT:      Head: Normocephalic and atraumatic.   Eyes:      Conjunctiva/sclera: Conjunctivae normal.   Cardiovascular:      Rate and Rhythm: Normal rate and regular rhythm.      Heart sounds: Normal heart sounds.   Pulmonary:      Effort: Pulmonary effort is normal.      Breath sounds: Normal breath sounds.   Musculoskeletal:      Cervical back: Neck supple.   Skin:     General: Skin is warm and dry.      Capillary Refill: Capillary refill takes less than 2 seconds.   Neurological:      General: No focal deficit present.      Mental Status: He is alert and oriented to person, place, and time.   Psychiatric:         Mood and Affect: Mood normal.         Behavior: Behavior normal.         Thought Content: Thought content normal.         Judgment: Judgment normal.           Recent Labs:  Recent  Results (from the past 2016 hour(s))   Lipid panel    Collection Time: 04/22/21  8:30 AM   Result Value Ref Range    Cholesterol 191 75 - 199 mg/dL    Triglycerides 438 (H) 10 - 150 mg/dL    HDL 33 (L) 40 - 55 mg/dL    LDL Calculated  mg/dL     Unable to calculate due to triglycerides >400 mg/dl    Coronary Heart Disease Risk 5.79     VLDL  0 - 40     Unable to calculate due to triglycerides >400 mg/dl   Hemoglobin A1C    Collection Time: 04/22/21  8:30 AM   Result Value Ref Range    Hgb A1C, % 7.3 %    Estimated Average Glucose 163 mg/dL   ALT    Collection Time: 04/22/21  8:30 AM   Result Value Ref Range    ALT 33 0 - 55 U/L   Vitamin D,25 OH, Total    Collection Time: 04/22/21  8:30 AM   Result Value Ref Range    Vitamin D 25-Hydroxy 17.6 (L) 30.0 - 80.0 ng/mL   Renal function panel     Collection Time: 04/22/21  8:30 AM   Result Value Ref Range    Sodium 142 136 - 147 mMol/L    Potassium 4.9 3.5 - 5.3 mMol/L    Chloride 107 98 - 110 mMol/L    CO2 24.6 20.0 - 30.0 mMol/L    Calcium 9.0 8.5 - 10.5 mg/dL    Glucose 148 (H) 71 - 99 mg/dL    Creatinine 3.42 (H) 0.80 - 1.30 mg/dL    BUN 36 (H) 7 - 22 mg/dL    Albumin 2.5 (L) 3.5 - 5.0 gm/dL    Phosphorus 4.1 2.3 - 4.7 mg/dL    Anion Gap 15.3 7.0 - 18.0 mMol/L    BUN / Creatinine Ratio 10.5 10.0 - 30.0 Ratio    EGFR 20 (L) 60 - 150 mL/min/1.69m2    Osmolality Calculated 294 275 - 300 mOsm/kg   PTH, Intact    Collection Time: 04/22/21  8:30 AM   Result Value Ref Range    PTH Intact 35.5 20.0 - 83.0 pg/mL   Protein / creatinine ratio, urine    Collection Time: 04/22/21  8:30 AM   Result Value Ref Range    Urine Protein 626.4 (H) 0.0 - 14.0 mg/dL    Urine Creatinine Random 53 mg/dL    Protein/Creatinine Ratio 11.82 (H) 0.00 - 0.09 Ratio        Radiology Results:  No results found.    ASSESSMENT and PLAN       Problem List Items Addressed This Visit    None  Visit Diagnoses       Type 2 diabetes mellitus with diabetic neuropathy, with long-term current use of insulin                 Follow up  No follow-ups on file.     Patient was counseled on possible medicine side effects which may include rash, swelling and/or stomach upset.  Patient was instructed to notify me or the ER if they experience problems.    No orders of the defined types were placed in this encounter.       Before leaving the office, the patient was informed of all ordered diagnostic tests and consults.     If you have not heard back from [place] about test results in  14 days, please call the office at [phone].    Signed by: Rhunette Croft, MD    The patient's electronic medical record was reviewed, any changes in the past medical history, past surgical history, medications, diagnostic tests were noted, and the record was updated accordingly.     Discussed option available for my chart  which provides electronic access to diagnostic results.

## 2021-06-18 ENCOUNTER — Other Ambulatory Visit (RURAL_HEALTH_CENTER): Payer: Self-pay

## 2021-06-18 NOTE — Progress Notes (Signed)
Referral received. Will review chart and follow up with patient.    Nelia Shi, RN, BSN  RN Care Coordinator  [place]

## 2021-06-20 ENCOUNTER — Encounter (RURAL_HEALTH_CENTER): Payer: Self-pay | Admitting: Family Medicine

## 2021-06-20 NOTE — Progress Notes (Signed)
[facility]  [address]  [place], [place] [address]  Phone [phone]   Fax [phone]    06/17/2021     Patient:   [name]                                                                                                             DOB:       02/24/1965                                        Provider Name:  Rhunette Croft, MD     SUBJECTIVE       HPI:  [name] is a 56 y.o. male who is being seen today for   Chief Complaint   Patient presents with    Establish Care     Patient with multiple medical problems who is on disability but he states that he has no insurance.  The patient has a history of diabetes and is presently on Lantus 68 units and novolog sliding scale.  He has a free style libre .  He is additionally on daily victoza.  The patient also has CKD stage 4.  He does have a nephrologist who he sees regularly.      He has a history of hypertension and his blood pressure in clinic today is 140/72.  He denies chest pain, palpitations, SOB.  He is presently on metoprolol and lasix      ROS:  Review of Systems    All other systems reviewed and negative except as above, pertinent findings in HPI.    Patient Active Problem List   Diagnosis    Type 2 diabetes mellitus with hyperglycemia, with long-term current use of insulin    Carpal tunnel syndrome of right wrist    Essential hypertension    History of kidney injury    Mixed hyperlipidemia    Diabetic foot ulcer    Diabetic nephropathy    Diabetic polyneuropathy associated with type 2 diabetes mellitus    Chronic left shoulder pain    Pain in lower jaw    Muscle cramps    Acute non-recurrent maxillary sinusitis    Stage 4 chronic kidney disease    Preoperative cardiovascular examination    Morbid obesity    CGM Dex Com    Closed displaced transverse fracture of left  patella    Left patella fracture    S/P ORIF (open reduction internal fixation) fracture    Backache    Charcot's joint of foot, right    Dental caries into pulp    Diabetic retinopathy    GERD (gastroesophageal reflux disease)    Vitamin D deficiency    Illness, unspecified    Morbid (severe) obesity due to excess calories    Body mass index 40.0-44.9, adult      Social History  Tobacco Use   Smoking Status Some Days    Packs/day: 0.25    Types: Cigarettes    Last attempt to quit: 09/06/2017    Years since quitting: 3.7   Smokeless Tobacco Never   Tobacco Comments    3 packs a week   Vaping Use   Vaping Status Never Used      Social History     Substance and Sexual Activity   Alcohol Use No    Alcohol/week: 0.0 standard drinks of alcohol    Comment: in past      Allergies   Allergen Reactions    Influenza Virus Vaccine Anaphylaxis     Current Outpatient Medications   Medication Instructions    aspirin EC 81 mg, Oral, Every evening    atorvastatin (LIPITOR) 40 MG tablet TAKE 1 TABLET BY MOUTH ONCE DAILY    calcium carbonate (OS-CAL) 600 mg, Oral, Daily    Continuous Blood Gluc Sensor (FreeStyle Libre 14 Day Sensor) Misc 1 each, Does not apply, Every 14 days    continuous blood glucose receiver (DEXCOM G6) device Use as directed per manufacturer instructions    continuous blood glucose sensor (DEXCOM G6) device 1 Device, Subcutaneous, EVERY 10 DAYS    continuous blood glucose transmitter (DEXCOM G6) device Use as directed every 90 days per manufacturer instructions    ergocalciferol (ERGOCALCIFEROL) 1.25 MG (50000 UT) capsule One capsule weekly x4 weeks, then one capsule every other week    furosemide (LASIX) 40 mg, Oral, Daily    gabapentin (NEURONTIN) 300 mg, Oral, 3 times daily    glucose blood test strip 1 each, Other, 4 times daily, Use as instructed     insulin aspart (NovoLOG FlexPen) 100 UNIT/ML injection pen 6 units breakfast and lunch, 10 units supper + correction up to 50 units per day    insulin  glargine (LANTUS SOLOSTAR) 100 UNIT/ML injection pen Inject 68 Units into the skin daily    Insulin Pen Needle 31G X 6 MM Misc Does not apply, Use up to 7 times a day as directed     liraglutide (Victoza) 18 MG/3ML injection INJECT 1.8MG  SUBCUTANEOUSLY ONCE DAILY    metoprolol tartrate (LOPRESSOR) 50 MG tablet Take 1 tablet by mouth twice daily    Multiple Vitamins-Minerals (Multi For Him 50+) Tab Oral    Vitamin D3 (CHOLECALCIFEROL) 50 mcg, Oral, Daily        PHYSICAL EXAM     Vital Signs:  BP 140/72 (BP Site: Left arm, Patient Position: Sitting, Cuff Size: Large)   Pulse 83   Temp 97.8 F (36.6 C) (Tympanic)   Resp 17   Ht 1.676 m (5\' 6" )   Wt 119 kg (262 lb 6.4 oz)   SpO2 96%   BMI 42.35 kg/m   Physical Exam  Vitals reviewed.   Constitutional:       General: He is not in acute distress.     Appearance: Normal appearance. He is not ill-appearing.   HENT:      Head: Normocephalic and atraumatic.   Eyes:      Conjunctiva/sclera: Conjunctivae normal.   Cardiovascular:      Rate and Rhythm: Normal rate and regular rhythm.      Heart sounds: Normal heart sounds.   Pulmonary:      Effort: Pulmonary effort is normal.      Breath sounds: Normal breath sounds.   Musculoskeletal:      Cervical back: Neck supple.   Skin:  General: Skin is warm and dry.      Capillary Refill: Capillary refill takes less than 2 seconds.   Neurological:      General: No focal deficit present.      Mental Status: He is alert and oriented to person, place, and time.   Psychiatric:         Mood and Affect: Mood normal.         Behavior: Behavior normal.         Thought Content: Thought content normal.         Judgment: Judgment normal.           Recent Labs:  Recent Results (from the past 2016 hour(s))   Lipid panel    Collection Time: 04/22/21  8:30 AM   Result Value Ref Range    Cholesterol 191 75 - 199 mg/dL    Triglycerides 438 (H) 10 - 150 mg/dL    HDL 33 (L) 40 - 55 mg/dL    LDL Calculated  mg/dL     Unable to calculate due to  triglycerides >400 mg/dl    Coronary Heart Disease Risk 5.79     VLDL  0 - 40     Unable to calculate due to triglycerides >400 mg/dl   Hemoglobin A1C    Collection Time: 04/22/21  8:30 AM   Result Value Ref Range    Hgb A1C, % 7.3 %    Estimated Average Glucose 163 mg/dL   ALT    Collection Time: 04/22/21  8:30 AM   Result Value Ref Range    ALT 33 0 - 55 U/L   Vitamin D,25 OH, Total    Collection Time: 04/22/21  8:30 AM   Result Value Ref Range    Vitamin D 25-Hydroxy 17.6 (L) 30.0 - 80.0 ng/mL   Renal function panel    Collection Time: 04/22/21  8:30 AM   Result Value Ref Range    Sodium 142 136 - 147 mMol/L    Potassium 4.9 3.5 - 5.3 mMol/L    Chloride 107 98 - 110 mMol/L    CO2 24.6 20.0 - 30.0 mMol/L    Calcium 9.0 8.5 - 10.5 mg/dL    Glucose 148 (H) 71 - 99 mg/dL    Creatinine 3.42 (H) 0.80 - 1.30 mg/dL    BUN 36 (H) 7 - 22 mg/dL    Albumin 2.5 (L) 3.5 - 5.0 gm/dL    Phosphorus 4.1 2.3 - 4.7 mg/dL    Anion Gap 15.3 7.0 - 18.0 mMol/L    BUN / Creatinine Ratio 10.5 10.0 - 30.0 Ratio    EGFR 20 (L) 60 - 150 mL/min/1.94m2    Osmolality Calculated 294 275 - 300 mOsm/kg   PTH, Intact    Collection Time: 04/22/21  8:30 AM   Result Value Ref Range    PTH Intact 35.5 20.0 - 83.0 pg/mL   Protein / creatinine ratio, urine    Collection Time: 04/22/21  8:30 AM   Result Value Ref Range    Urine Protein 626.4 (H) 0.0 - 14.0 mg/dL    Urine Creatinine Random 53 mg/dL    Protein/Creatinine Ratio 11.82 (H) 0.00 - 0.09 Ratio        Radiology Results:  No results found.    ASSESSMENT and PLAN       Problem List Items Addressed This Visit       Body mass index 40.0-44.9, adult    Relevant Orders  Ambulatory referral to Nurse Navigators    Morbid (severe) obesity due to excess calories    Relevant Orders    Ambulatory referral to Nurse Navigators    Stage 4 chronic kidney disease    Relevant Orders    Comprehensive metabolic panel    Vitamin D deficiency    Relevant Medications    Vitamin D3 (CHOLECALCIFEROL) 50 MCG (2000 UT)  tablet     Other Visit Diagnoses       Prostate cancer screening    -  Primary    Relevant Orders    PSA    Type 2 diabetes mellitus with diabetic neuropathy, with long-term current use of insulin        Relevant Medications    gabapentin (NEURONTIN) 300 MG capsule    Other Relevant Orders    Ambulatory referral to Nurse Navigators             Follow up  Return in about 6 weeks (around 07/29/2021).     Patient was counseled on possible medicine side effects which may include rash, swelling and/or stomach upset.  Patient was instructed to notify me or the ER if they experience problems.    No orders of the defined types were placed in this encounter.       Before leaving the office, the patient was informed of all ordered diagnostic tests and consults.     If you have not heard back from [place] about test results in 14 days, please call the office at [phone].    Signed by: Rhunette Croft, MD    The patient's electronic medical record was reviewed, any changes in the past medical history, past surgical history, medications, diagnostic tests were noted, and the record was updated accordingly.     Discussed option available for my chart which provides electronic access to diagnostic results.

## 2021-06-21 ENCOUNTER — Other Ambulatory Visit (RURAL_HEALTH_CENTER): Payer: Self-pay

## 2021-06-21 NOTE — Progress Notes (Signed)
LVMM for patient to return call. Left  [facility] phone number for patient to reach out for Medicare eligibility/sign up  assistance [phone].    Nelia Shi, RN, BSN  RN Care Coordinator  [place]

## 2021-07-05 ENCOUNTER — Ambulatory Visit
Admission: RE | Admit: 2021-07-05 | Discharge: 2021-07-05 | Disposition: A | Discharge status: Home / Self Care | Payer: PRIVATE HEALTH INSURANCE | Source: Ambulatory Visit

## 2021-07-05 ENCOUNTER — Ambulatory Visit
Admission: RE | Admit: 2021-07-05 | Discharge: 2021-07-05 | Disposition: A | Discharge status: Home / Self Care | Payer: PRIVATE HEALTH INSURANCE | Source: Ambulatory Visit | Attending: Family Medicine | Admitting: Family Medicine

## 2021-07-05 DIAGNOSIS — N184 Chronic kidney disease, stage 4 (severe): Secondary | ICD-10-CM

## 2021-07-05 DIAGNOSIS — Z125 Encounter for screening for malignant neoplasm of prostate: Secondary | ICD-10-CM | POA: Insufficient documentation

## 2021-07-05 DIAGNOSIS — N1831 Chronic kidney disease, stage 3a: Secondary | ICD-10-CM | POA: Insufficient documentation

## 2021-07-05 LAB — COMPREHENSIVE METABOLIC PANEL
ALT: 23 U/L (ref 0–55)
AST (SGOT): 18 U/L (ref 10–42)
Albumin/Globulin Ratio: 0.72 Ratio — ABNORMAL LOW (ref 0.80–2.00)
Albumin: 2.6 gm/dL — ABNORMAL LOW (ref 3.5–5.0)
Alkaline Phosphatase: 124 U/L (ref 40–145)
Anion Gap: 15.9 mMol/L (ref 7.0–18.0)
BUN / Creatinine Ratio: 13.1 Ratio (ref 10.0–30.0)
BUN: 58 mg/dL — ABNORMAL HIGH (ref 7–22)
Bilirubin, Total: 0.2 mg/dL (ref 0.1–1.2)
CO2: 23.1 mMol/L (ref 20.0–30.0)
Calcium: 10.2 mg/dL (ref 8.5–10.5)
Chloride: 105 mMol/L (ref 98–110)
Creatinine: 4.42 mg/dL — ABNORMAL HIGH (ref 0.80–1.30)
EGFR: 15 mL/min/{1.73_m2} — ABNORMAL LOW (ref 60–150)
Globulin: 3.6 gm/dL (ref 2.0–4.0)
Glucose: 176 mg/dL — ABNORMAL HIGH (ref 71–99)
Osmolality Calculated: 298 mOsm/kg (ref 275–300)
Potassium: 5 mMol/L (ref 3.5–5.3)
Protein, Total: 6.2 gm/dL (ref 6.0–8.3)
Sodium: 139 mMol/L (ref 136–147)

## 2021-07-05 LAB — PROTEIN / CREATININE RATIO, URINE
Protein/Creatinine Ratio: 11.6 Ratio — ABNORMAL HIGH (ref 0.00–0.09)
Urine Creatinine Random: 62 mg/dL
Urine Protein: 719 mg/dL — ABNORMAL HIGH (ref 0–14)

## 2021-07-05 LAB — RENAL FUNCTION PANEL
Albumin: 2.6 gm/dL — ABNORMAL LOW (ref 3.5–5.0)
Anion Gap: 16.7 mMol/L (ref 7.0–18.0)
BUN / Creatinine Ratio: 13.2 Ratio (ref 10.0–30.0)
BUN: 58 mg/dL — ABNORMAL HIGH (ref 7–22)
CO2: 22.2 mMol/L (ref 20.0–30.0)
Calcium: 10 mg/dL (ref 8.5–10.5)
Chloride: 105 mMol/L (ref 98–110)
Creatinine: 4.38 mg/dL — ABNORMAL HIGH (ref 0.80–1.30)
EGFR: 15 mL/min/{1.73_m2} — ABNORMAL LOW (ref 60–150)
Glucose: 174 mg/dL — ABNORMAL HIGH (ref 71–99)
Osmolality Calculated: 298 mOsm/kg (ref 275–300)
Phosphorus: 4.4 mg/dL (ref 2.3–4.7)
Potassium: 4.9 mMol/L (ref 3.5–5.3)
Sodium: 139 mMol/L (ref 136–147)

## 2021-07-05 LAB — PTH, INTACT: PTH Intact: 15.1 pg/mL — ABNORMAL LOW (ref 20.0–83.0)

## 2021-07-05 LAB — HEMOGLOBIN AND HEMATOCRIT, BLOOD
Hematocrit: 39.8 % (ref 39.0–52.5)
Hemoglobin: 12.7 gm/dL — ABNORMAL LOW (ref 13.0–17.5)

## 2021-07-06 ENCOUNTER — Other Ambulatory Visit (RURAL_HEALTH_CENTER): Payer: Self-pay

## 2021-07-06 LAB — PSA: PSA: 0.49 ng/mL (ref 0.000–4.000)

## 2021-07-06 LAB — VITAMIN D,25 OH,TOTAL: Vitamin D 25-Hydroxy: 15.2 ng/mL — ABNORMAL LOW (ref 30.0–80.0)

## 2021-07-06 NOTE — Telephone Encounter (Signed)
Requested Prescriptions     Pending Prescriptions Disp Refills    furosemide (Lasix) 40 MG tablet 30 tablet 1     Sig: Take 1 tablet (40 mg) by mouth daily     LAST OV 06/17/2021

## 2021-07-09 MED ORDER — FUROSEMIDE 40 MG PO TABS
40.0000 mg | ORAL_TABLET | Freq: Every day | ORAL | 1 refills | Status: DC
Start: 2021-07-09 — End: 2021-08-31

## 2021-07-13 DIAGNOSIS — N2581 Secondary hyperparathyroidism of renal origin: Secondary | ICD-10-CM | POA: Insufficient documentation

## 2021-07-13 DIAGNOSIS — N250 Renal osteodystrophy: Secondary | ICD-10-CM | POA: Insufficient documentation

## 2021-07-13 DIAGNOSIS — D631 Anemia in chronic kidney disease: Secondary | ICD-10-CM | POA: Insufficient documentation

## 2021-07-14 ENCOUNTER — Ambulatory Visit (INDEPENDENT_AMBULATORY_CARE_PROVIDER_SITE_OTHER): Payer: PRIVATE HEALTH INSURANCE | Admitting: Surgery

## 2021-07-14 ENCOUNTER — Encounter (INDEPENDENT_AMBULATORY_CARE_PROVIDER_SITE_OTHER): Payer: Self-pay | Admitting: Surgery

## 2021-07-14 VITALS — BP 197/108 | HR 79 | Temp 97.0°F | Resp 18 | Ht 66.0 in | Wt 257.0 lb

## 2021-07-14 DIAGNOSIS — Z992 Dependence on renal dialysis: Secondary | ICD-10-CM

## 2021-07-14 DIAGNOSIS — N185 Chronic kidney disease, stage 5: Secondary | ICD-10-CM

## 2021-07-14 DIAGNOSIS — N186 End stage renal disease: Secondary | ICD-10-CM

## 2021-07-14 NOTE — Progress Notes (Signed)
History and Physical  Rhunette Croft, MD  Blanchard Mane, MD  [address]  [place]  [place],  [place] [address]    07/14/2021    CC:   Chief Complaint   Patient presents with    Initial Consult     Eval AVF per Renal Dr.        [place] Mons:  [name] is a 57 y.o. male who presents for evaluation for dialysis access. 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       Past Medical History    Allergies:   Allergies   Allergen Reactions    Influenza Virus Vaccine Anaphylaxis       Illness:   Past Medical History:   Diagnosis Date    Abnormal vision     Arthritis     Complication of anesthesia     last time he was kicking his legs had to wake him up    Diabetes mellitus     Diabetic ulcer of left foot 12/26/2014    Disc     End-stage renal disease 2021    stage 5 (2023)    Gastroesophageal reflux disease     Hyperlipidemia     Hypertension     Low back pain     Neuropathy     Seasonal allergic rhinitis     Sleep apnea     not tested    Type 2 diabetes mellitus, controlled        Operations:   Past Surgical History:   Procedure Laterality Date    ARTHRODESIS, TOE Left 05/04/2015    Procedure: ARTHRODESIS, TOE;  Surgeon: Janora Norlander, DPM;  Location: [facility];  Service: Podiatry;  Laterality: Left;  1st MPJ FUSION    DEBRIDEMENT & IRRIGATION, LOWER EXTREMITY Left 03/12/2015    Procedure: [place], LOWER EXTREMITY;  Surgeon: Janora Norlander, DPM;  Location: [facility];  Service: Podiatry;  Laterality: Left;  I&D LEFT FOOT FOR BONE INFECTION    DEBRIDEMENT & IRRIGATION, LOWER EXTREMITY Left 03/07/2016    Procedure: DEBRIDEMENT & IRRIGATION, LEFT FOOT 2ND TOE, EXCISION OF 2ND METATARSEL;  Surgeon: Janora Norlander, DPM;  Location: [facility];  Service: Podiatry;  Laterality: Left;  I&D Left Foot    FOOT SURGERY Right 05/2020    [facility]    HAND SURGERY      JOINT REPLACEMENT      ORIF, PATELLA Left 09/21/2020    Procedure: ORIF, LEFT KNEE PATELLA;  Surgeon: Eugenie Filler, MD;  Location: [facility];  Service: Orthopedics;  Laterality: Left;    ORIF, PATELLA Left 12/24/2020    Procedure: LEFT PATELLA HARDWARE REVISION / ORIF;  Surgeon: Marlin Canary, MD;  Location: [facility];  Service: Orthopedics;  Laterality: Left;    SESAMOIDECTOMY Left 05/24/2019    Procedure: SESAMOIDECTOMY;  Surgeon: [name], DPM;  Location: [facility];  Service: Podiatry;  Laterality: Left;       Medications:   Current Outpatient Medications:     aspirin EC 81 MG EC tablet, Take 1 tablet (81 mg total) by mouth every evening., Disp: 90 tablet, Rfl: 3    atorvastatin (LIPITOR) 40 MG tablet, TAKE 1 TABLET BY MOUTH ONCE DAILY, Disp: 90 tablet, Rfl: 2    Continuous Blood Gluc Sensor (FreeStyle Libre 14 Day Sensor) Misc, Use 1 each every 14 (fourteen) days, Disp: 6 each, Rfl: 2    continuous blood glucose  receiver (DEXCOM G6) device, Use as directed per manufacturer instructions, Disp: 1 each, Rfl: 0    continuous blood glucose sensor (DEXCOM G6) device, Inject 1 Device into the skin every 10 (ten) days, Disp: 9 each, Rfl: 3    continuous blood glucose transmitter (DEXCOM G6) device, Use as directed every 90 days per manufacturer instructions, Disp: 1 each, Rfl: 3    ergocalciferol (ERGOCALCIFEROL) 1.25 MG (50000 UT) capsule, One capsule weekly x4 weeks, then one capsule every other week, Disp: 4 capsule, Rfl: 2    furosemide (Lasix) 40 MG tablet, Take 1 tablet (40 mg) by mouth daily, Disp: 30 tablet, Rfl: 1    gabapentin (NEURONTIN) 300 MG capsule, Take 1 capsule (300 mg) by mouth 3 (three) times daily, Disp: 270 capsule, Rfl: 1    glucose blood test strip, 1 each by Other route 4 (four) times daily Use as instructed, Disp: , Rfl:     insulin aspart (NovoLOG FlexPen) 100 UNIT/ML injection pen, 6 units breakfast and lunch, 10 units supper + correction up to 50 units per day, Disp: 45 mL, Rfl: 0    insulin glargine (LANTUS SOLOSTAR) 100 UNIT/ML injection pen, Inject 68  Units into the skin daily, Disp: 75 mL, Rfl: 0    Insulin Pen Needle 31G X 6 MM Misc, Use Use up to 7 times a day as directed, Disp: , Rfl:     liraglutide (Victoza) 18 MG/3ML injection, INJECT 1.8MG  SUBCUTANEOUSLY ONCE DAILY, Disp: 27 mL, Rfl: 3    metoprolol tartrate (LOPRESSOR) 50 MG tablet, Take 1 tablet by mouth twice daily, Disp: 180 tablet, Rfl: 0    Multiple Vitamins-Minerals (Multi For Him 50+) Tab, Take by mouth, Disp: , Rfl:     Vitamin D3 (CHOLECALCIFEROL) 50 MCG (2000 UT) tablet, Take 1 tablet (50 mcg) by mouth daily, Disp: , Rfl:     Social History:   Social History     Socioeconomic History    Marital status: Married   Occupational History    Occupation: unemployed   Tobacco Use    Smoking status: Some Days     Packs/day: 0.25     Types: Cigarettes     Last attempt to quit: 09/06/2017     Years since quitting: 3.8    Smokeless tobacco: Never    Tobacco comments:     3 packs a week   Vaping Use    Vaping status: Never Used   Substance and Sexual Activity    Alcohol use: No     Alcohol/week: 0.0 standard drinks of alcohol     Comment: in past    Drug use: No     Comment: in past    Sexual activity: Yes     Partners: Female     Birth control/protection: None     Social Determinants of Health     Food Insecurity: No Food Insecurity (09/11/2020)    Hunger Vital Sign     Worried About Running Out of Food in the Last Year: Never true     Ran Out of Food in the Last Year: Never true       Family History   Family History   Problem Relation Age of Onset    Diabetes Mother     Lung cancer Mother     Cancer Mother         lung    Depression Sister     COPD Sister     Cancer Sister  breast    Hypertension Brother        Review of Systems   Constitutional: Negative for fever, chills, diaphoresis, activity change, appetite change, fatigue and unexpected weight change.   HEENT: Negative for hearing loss, ear pain, nosebleeds, congestion, facial swelling, rhinorrhea, sneezing, neck pain, neck stiffness, postnasal  drip, tinnitus and ear discharge.    Eyes: Negative for photophobia, pain, discharge, redness, itching and visual disturbance.   Respiratory: Negative for apnea, cough, choking, chest tightness, shortness of breath, wheezing and stridor.    Cardiovascular: Negative for chest pain, palpitations and leg swelling.   Gastrointestinal: Negative for abdominal distention, pain, bleeding, change in bowel habits, or any other symptoms.   Genitourinary: Negative for dysuria, urgency, frequency, hematuria, flank pain, decreased urine volume,  Musculoskeletal: Negative for back pain, joint swelling, arthralgias and gait problem.   Neurological: Negative for dizziness, tremors, seizures, syncope, facial asymmetry, speech difficulty, weakness, light-headedness, numbness and headaches.   Hematological: Negative for adenopathy. Does not bruise/bleed easily.   Psychiatric/Behavioral: Negative for hallucinations, behavioral problems, confusion, dysphoric mood, decreased concentration and agitation.       PE:  BP (!) 197/108   Pulse 79   Temp 97 F (36.1 C) (Temporal)   Resp 18   Ht 1.676 m (5\' 6" )   Wt 116.6 kg (257 lb)   BMI 41.48 kg/m   General: Alert, oriented X3, in no acute distress  HEENT : NC/AT. PERRLA. Sclera non-icteric  Neck: Supple, no lymphadenopathy, no bruits  Lungs: CTA  CV: RRR S1 and S2 normal  Abdomen: soft, non tender, non-distended, no masses, no hernia  Extremities:  no clubbing, cyanosis, or edema  Skin no suspicious lesions  Neuro: No focal deficits    Ultrasound vein mapping was performed in the office today of the left arm.        Left arm:    Cephalic vein at wrist: < 2 mm   Cephalic vein mid forearm: < 2 mm  Cephalic vein at antecubital fossa: 7.0 mm  Cephalic vein upper arm: 4.3 mm    Basilic vein antecubital [id number]:1.1 mm  Basilic vein upper arm. 3.4 mm      A: Vein mapping with findings as outlined above.    Patient is a suitable candidate for a left upper arm AV   fistula.      Assessment/Plan:    1. CKD (chronic kidney disease) stage 5, GFR less than 15 ml/min        2. Encounter regarding vascular access for dialysis for end-stage renal disease          Plan left upper arm AV fistula    The risks, benefits, and alternatives to placement of an AV fistula or graft were discussed with the patient. Informed consent (infection, bleeding, anesthesia risks, need for further operations/procedures, injury to adjacent structures, failure of the fistula/graft, thrombosis, need for secondary procedures, Steal Syndrome, loss of limb function, nerve injury ) was obtained.    Orders Placed This Encounter   Procedures    Case request operating room: FORMATION, A-V FISTULA, UPPER EXTREMITY

## 2021-07-25 ENCOUNTER — Other Ambulatory Visit (RURAL_HEALTH_CENTER): Payer: Self-pay | Admitting: Internal Medicine

## 2021-07-25 DIAGNOSIS — E782 Mixed hyperlipidemia: Secondary | ICD-10-CM

## 2021-07-25 DIAGNOSIS — E1165 Type 2 diabetes mellitus with hyperglycemia: Secondary | ICD-10-CM

## 2021-07-28 ENCOUNTER — Other Ambulatory Visit (RURAL_HEALTH_CENTER): Payer: Self-pay

## 2021-07-28 NOTE — Progress Notes (Signed)
LVMM for patient to call if concerns/needs.    Nelia Shi, RN, BSN  RN Care Coordinator  [facility]

## 2021-07-29 ENCOUNTER — Ambulatory Visit (RURAL_HEALTH_CENTER): Payer: PRIVATE HEALTH INSURANCE

## 2021-07-30 ENCOUNTER — Ambulatory Visit: Payer: PRIVATE HEALTH INSURANCE | Attending: Family Medicine | Admitting: Family Medicine

## 2021-07-30 ENCOUNTER — Encounter (RURAL_HEALTH_CENTER): Payer: Self-pay | Admitting: Family Medicine

## 2021-07-30 ENCOUNTER — Ambulatory Visit
Admission: RE | Admit: 2021-07-30 | Discharge: 2021-07-30 | Disposition: A | Discharge status: Home / Self Care | Payer: PRIVATE HEALTH INSURANCE | Source: Ambulatory Visit | Attending: Internal Medicine | Admitting: Internal Medicine

## 2021-07-30 VITALS — BP 138/74 | HR 84 | Temp 96.5°F | Resp 20 | Ht 66.0 in | Wt 271.0 lb

## 2021-07-30 DIAGNOSIS — F5101 Primary insomnia: Secondary | ICD-10-CM

## 2021-07-30 DIAGNOSIS — R5383 Other fatigue: Secondary | ICD-10-CM

## 2021-07-30 DIAGNOSIS — N1831 Chronic kidney disease, stage 3a: Secondary | ICD-10-CM | POA: Insufficient documentation

## 2021-07-30 DIAGNOSIS — Z794 Long term (current) use of insulin: Secondary | ICD-10-CM | POA: Insufficient documentation

## 2021-07-30 DIAGNOSIS — E1165 Type 2 diabetes mellitus with hyperglycemia: Secondary | ICD-10-CM | POA: Insufficient documentation

## 2021-07-30 DIAGNOSIS — E782 Mixed hyperlipidemia: Secondary | ICD-10-CM | POA: Insufficient documentation

## 2021-07-30 DIAGNOSIS — R0981 Nasal congestion: Secondary | ICD-10-CM

## 2021-07-30 DIAGNOSIS — I10 Essential (primary) hypertension: Secondary | ICD-10-CM

## 2021-07-30 LAB — RENAL FUNCTION PANEL
Albumin: 2.8 gm/dL — ABNORMAL LOW (ref 3.5–5.0)
Anion Gap: 13.5 mMol/L (ref 7.0–18.0)
BUN / Creatinine Ratio: 16.5 Ratio (ref 10.0–30.0)
BUN: 68 mg/dL — ABNORMAL HIGH (ref 7–22)
CO2: 21.5 mMol/L (ref 20.0–30.0)
Calcium: 8.8 mg/dL (ref 8.5–10.5)
Chloride: 112 mMol/L — ABNORMAL HIGH (ref 98–110)
Creatinine: 4.13 mg/dL — ABNORMAL HIGH (ref 0.80–1.30)
EGFR: 16 mL/min/{1.73_m2} — ABNORMAL LOW (ref 60–150)
Glucose: 145 mg/dL — ABNORMAL HIGH (ref 71–99)
Osmolality Calculated: 305 mOsm/kg — ABNORMAL HIGH (ref 275–300)
Phosphorus: 5.4 mg/dL — ABNORMAL HIGH (ref 2.3–4.7)
Potassium: 5 mMol/L (ref 3.5–5.3)
Sodium: 142 mMol/L (ref 136–147)

## 2021-07-30 MED ORDER — METOPROLOL TARTRATE 50 MG PO TABS
50.0000 mg | ORAL_TABLET | Freq: Two times a day (BID) | ORAL | 3 refills | Status: AC
Start: 2021-07-30 — End: ?

## 2021-07-30 MED ORDER — TRAZODONE HCL 50 MG PO TABS
50.0000 mg | ORAL_TABLET | Freq: Every evening | ORAL | 2 refills | Status: DC
Start: 2021-07-30 — End: 2021-09-13

## 2021-07-30 MED ORDER — AZELASTINE HCL 0.1 % NA SOLN
1.0000 | Freq: Two times a day (BID) | NASAL | 3 refills | Status: DC
Start: 2021-07-30 — End: 2021-11-21

## 2021-07-30 NOTE — Patient Instructions (Addendum)
Increase your furosemide to 1 and 1/2 tablets daily for the next week.    The sleep clinic will call you to get the sleep study ordered

## 2021-07-30 NOTE — Progress Notes (Signed)
[facility]  [address]  [place], [place] [address]  Phone [phone]   Fax [phone]    07/30/2021     Patient:   [name]                                                                                                             DOB:       [birth year]                                        Provider Name:  Rhunette Croft, MD     SUBJECTIVE       HPI:  [name] is a 56 y.o. male who is being seen today for   Chief Complaint   Patient presents with    Diabetes    Vitamin D Deficiency    Obesity     6 week F/U     ESRD:  The patient is being prepared for dialysis.  It sounds like he would prefer to do peritoneal dialysis.  He states that he prefers the type that he can do from home although they have discussed vascular access. He denies sob, nausea or vomiting.  He does have some 1+ symmetrical pitting edema to the lower extremities.  Along with the edema he has had 14lb weight gain since his previous visit.  He has also noted that he has to sleep in an upright position that he thinks is secondary to his sinus drainage.  He complains of increased sinus drainage and congestion.  He feels like this may be related to seasonal allergies.  He has had no fever or constitutional symptoms, although he does have fatigue this has been chronic.  The patient's appointment to discuss dialysis with the nephrology team on 6/20.    Insomnia:  The patient has problems with sleep intiation and maintenance.  If he awakens early then he is not able to get back to sleep.  He is only able to get about 4 consecutive hours of sleep.  He does nap during the day.  He does have fatigue which is progressively gotten worse.              ROS:  Review of Systems    All other systems reviewed and negative except as above, pertinent findings in  HPI.    Patient Active Problem List   Diagnosis    Type 2 diabetes mellitus with diabetic chronic kidney disease    Carpal tunnel syndrome of right wrist    Essential hypertension    History of kidney injury    Mixed hyperlipidemia    Diabetic foot ulcer    Diabetic nephropathy associated with type 2 diabetes mellitus    Diabetic polyneuropathy associated with type 2 diabetes mellitus    Chronic pain of left upper extremity    Pain in lower jaw  Muscle cramps    Stage 5 chronic kidney disease    Morbid obesity    CGM Dex Com    Closed displaced transverse fracture of left patella    Left patella fracture    S/P ORIF (open reduction internal fixation) fracture    Backache    Charcot's joint of foot    Dental caries extending into pulp    Diabetic retinopathy    GERD (gastroesophageal reflux disease)    Vitamin D deficiency    Illness, unspecified    Morbid (severe) obesity due to excess calories    Obesity, Class III, BMI 40-49.9 (morbid obesity)    Encounter regarding vascular access for dialysis for end-stage renal disease    Anemia in chronic kidney disease    Neuropathy    Renal osteodystrophy    Secondary hyperparathyroidism      Social History     Tobacco Use   Smoking Status Former    Packs/day: 0.25    Types: Cigarettes    Quit date: 09/06/2017    Years since quitting: 3.9   Smokeless Tobacco Never   Tobacco Comments    3 packs a week      Social History     Substance and Sexual Activity   Alcohol Use No    Alcohol/week: 0.0 standard drinks of alcohol    Comment: in past      Allergies   Allergen Reactions    Influenza Virus Vaccine Anaphylaxis     Current Outpatient Medications   Medication Instructions    amLODIPine (NORVASC) 10 mg, Oral    aspirin EC 81 mg, Oral, Every evening    atorvastatin (LIPITOR) 40 MG tablet TAKE 1 TABLET BY MOUTH ONCE DAILY    azelastine (ASTELIN) 0.1 % nasal spray 1 spray, Nasal, 2 times daily, Use in each nostril as directed    Continuous Blood Gluc Sensor (FreeStyle Libre 14  Day Sensor) Misc 1 each, Does not apply, Every 14 days    continuous blood glucose receiver (DEXCOM G6) device Use as directed per manufacturer instructions    continuous blood glucose sensor (DEXCOM G6) device 1 Device, Subcutaneous, EVERY 10 DAYS    continuous blood glucose transmitter (DEXCOM G6) device Use as directed every 90 days per manufacturer instructions    ergocalciferol (ERGOCALCIFEROL) 1.25 MG (50000 UT) capsule One capsule weekly x4 weeks, then one capsule every other week    furosemide (LASIX) 40 mg, Oral, Daily    gabapentin (NEURONTIN) 300 mg, Oral, 3 times daily    glucose blood test strip 1 each, Other, 4 times daily, Use as instructed     hydrALAZINE (APRESOLINE) 50 mg, Oral    insulin aspart (NovoLOG FlexPen) 100 UNIT/ML injection pen 6 units breakfast and lunch, 10 units supper + correction up to 50 units per day    insulin glargine (LANTUS SOLOSTAR) 100 UNIT/ML injection pen Inject 68 Units into the skin daily    Insulin Pen Needle 31G X 6 MM Misc Does not apply, Use up to 7 times a day as directed     liraglutide (Victoza) 18 MG/3ML injection INJECT 1.8MG  SUBCUTANEOUSLY ONCE DAILY    metoprolol tartrate (LOPRESSOR) 50 mg, Oral, 2 times daily    Multiple Vitamins-Minerals (Multi For Him 50+) Tab Oral    traZODone (DESYREL) 50 mg, Oral, At bedtime    Vitamin D3 (CHOLECALCIFEROL) 50 mcg, Oral, Daily        PHYSICAL EXAM     Vital Signs:  BP 138/74 (  BP Site: Right arm, Patient Position: Sitting, Cuff Size: Large)   Pulse 84   Temp (!) 96.5 F (35.8 C) (Tympanic)   Resp 20   Ht 1.676 m (5\' 6" )   Wt 122.9 kg (271 lb)   SpO2 95%   BMI 43.74 kg/m   Physical Exam  Vitals reviewed.   Constitutional:       General: He is not in acute distress.     Appearance: Normal appearance. He is not ill-appearing.   HENT:      Head: Normocephalic and atraumatic.   Eyes:      Conjunctiva/sclera: Conjunctivae normal.   Cardiovascular:      Rate and Rhythm: Normal rate and regular rhythm.      Heart sounds:  Normal heart sounds.   Pulmonary:      Effort: Pulmonary effort is normal. No respiratory distress.      Breath sounds: Wheezing and rales present.   Musculoskeletal:      Cervical back: Neck supple.      Right lower leg: Edema (1+) present.      Left lower leg: Edema (1+) present.   Skin:     General: Skin is warm and dry.      Capillary Refill: Capillary refill takes less than 2 seconds.   Neurological:      General: No focal deficit present.      Mental Status: He is alert and oriented to person, place, and time.   Psychiatric:         Mood and Affect: Mood normal.         Behavior: Behavior normal.         Thought Content: Thought content normal.         Judgment: Judgment normal.           Recent Labs:  Recent Results (from the past 2016 hour(s))   Comprehensive metabolic panel    Collection Time: 07/05/21  9:12 AM   Result Value Ref Range    Sodium 139 136 - 147 mMol/L    Potassium 5.0 3.5 - 5.3 mMol/L    Chloride 105 98 - 110 mMol/L    CO2 23.1 20.0 - 30.0 mMol/L    Calcium 10.2 8.5 - 10.5 mg/dL    Glucose 176 (H) 71 - 99 mg/dL    Creatinine 4.42 (H) 0.80 - 1.30 mg/dL    BUN 58 (H) 7 - 22 mg/dL    Protein, Total 6.2 6.0 - 8.3 gm/dL    Albumin 2.6 (L) 3.5 - 5.0 gm/dL    Alkaline Phosphatase 124 40 - 145 U/L    ALT 23 0 - 55 U/L    AST (SGOT) 18 10 - 42 U/L    Bilirubin, Total 0.2 0.1 - 1.2 mg/dL    Albumin/Globulin Ratio 0.72 (L) 0.80 - 2.00 Ratio    Anion Gap 15.9 7.0 - 18.0 mMol/L    BUN / Creatinine Ratio 13.1 10.0 - 30.0 Ratio    EGFR 15 (L) 60 - 150 mL/min/1.19m2    Osmolality Calculated 298 275 - 300 mOsm/kg    Globulin 3.6 2.0 - 4.0 gm/dL   PSA    Collection Time: 07/05/21  9:12 AM   Result Value Ref Range    PSA 0.490 0.000 - 4.000 ng/mL   Renal function panel    Collection Time: 07/05/21  9:12 AM   Result Value Ref Range    Sodium 139 136 - 147 mMol/L    Potassium 4.9  3.5 - 5.3 mMol/L    Chloride 105 98 - 110 mMol/L    CO2 22.2 20.0 - 30.0 mMol/L    Calcium 10.0 8.5 - 10.5 mg/dL    Glucose 174 (H) 71  - 99 mg/dL    Creatinine 4.38 (H) 0.80 - 1.30 mg/dL    BUN 58 (H) 7 - 22 mg/dL    Albumin 2.6 (L) 3.5 - 5.0 gm/dL    Phosphorus 4.4 2.3 - 4.7 mg/dL    Anion Gap 16.7 7.0 - 18.0 mMol/L    BUN / Creatinine Ratio 13.2 10.0 - 30.0 Ratio    EGFR 15 (L) 60 - 150 mL/min/1.26m2    Osmolality Calculated 298 275 - 300 mOsm/kg   Vitamin D,25 OH, Total    Collection Time: 07/05/21  9:12 AM   Result Value Ref Range    Vitamin D 25-Hydroxy 15.2 (L) 30.0 - 80.0 ng/mL   Hemoglobin and hematocrit, blood    Collection Time: 07/05/21  9:12 AM   Result Value Ref Range    Hemoglobin 12.7 (L) 13.0 - 17.5 gm/dL    Hematocrit 39.8 39.0 - 52.5 %   Protein / creatinine ratio, urine    Collection Time: 07/05/21  9:12 AM   Result Value Ref Range    Urine Protein 719 (H) 0 - 14 mg/dL    Urine Creatinine Random 62 mg/dL    Protein/Creatinine Ratio 11.60 (H) 0.00 - 0.09 Ratio   PTH, Intact    Collection Time: 07/05/21  9:12 AM   Result Value Ref Range    PTH Intact 15.1 (L) 20.0 - 83.0 pg/mL   Renal function panel    Collection Time: 07/30/21  8:30 AM   Result Value Ref Range    Sodium 142 136 - 147 mMol/L    Potassium 5.0 3.5 - 5.3 mMol/L    Chloride 112 (H) 98 - 110 mMol/L    CO2 21.5 20.0 - 30.0 mMol/L    Calcium 8.8 8.5 - 10.5 mg/dL    Glucose 145 (H) 71 - 99 mg/dL    Creatinine 4.13 (H) 0.80 - 1.30 mg/dL    BUN 68 (H) 7 - 22 mg/dL    Albumin 2.8 (L) 3.5 - 5.0 gm/dL    Phosphorus 5.4 (H) 2.3 - 4.7 mg/dL    Anion Gap 13.5 7.0 - 18.0 mMol/L    BUN / Creatinine Ratio 16.5 10.0 - 30.0 Ratio    EGFR 16 (L) 60 - 150 mL/min/1.46m2    Osmolality Calculated 305 (H) 275 - 300 mOsm/kg        Radiology Results:  No results found.    ASSESSMENT and PLAN       Problem List Items Addressed This Visit    None  Visit Diagnoses       Primary insomnia    -  Primary    trazodone, sleep apnea could figure in to his hypertension and hypertensive nephrosclerosis.  Should be checked for sleep apnea which if + management    Relevant Medications    traZODone (DESYREL)  50 MG tablet    Other Relevant Orders    Ambulatory referral to Sleep Medicine    Sinus congestion        likely related to his allergies, although he does have some flulid overload.  Will start him on asteline in addition to nasal steroids and antihistamines.      Relevant Medications    azelastine (ASTELIN) 0.1 % nasal spray    Primary  hypertension        Relevant Medications    amLODIPine (NORVASC) 10 MG tablet    hydrALAZINE (APRESOLINE) 50 MG tablet    metoprolol tartrate (LOPRESSOR) 50 MG tablet    Fatigue, unspecified type        multifactorial, chronic and related to multiple comorbidities             Follow up  Return in about 6 weeks (around 09/10/2021).     Patient was counseled on possible medicine side effects which may include rash, swelling and/or stomach upset.  Patient was instructed to notify me or the ER if they experience problems.    No orders of the defined types were placed in this encounter.       Before leaving the office, the patient was informed of all ordered diagnostic tests and consults.     If you have not heard back from [place] about test results in 14 days, please call the office at [phone].    Signed by: Rhunette Croft, MD    The patient's electronic medical record was reviewed, any changes in the past medical history, past surgical history, medications, diagnostic tests were noted, and the record was updated accordingly.     Discussed option available for my chart which provides electronic access to diagnostic results.

## 2021-08-01 NOTE — Progress Notes (Signed)
Please fax this lab to [facility].  There is a scanned order from Dr. Dimas Millin in the system on the date this was done, but they put it in under my name.  He is no longer in that office, but other providers are covering his patients.  This error delayed it getting to the correct office.    Please let the patient know that we have faxed it to them and he should follow-up with them.  Copying Clarene Critchley for incident report.

## 2021-08-08 ENCOUNTER — Encounter (RURAL_HEALTH_CENTER): Payer: Self-pay | Admitting: Family Medicine

## 2021-08-13 ENCOUNTER — Other Ambulatory Visit (RURAL_HEALTH_CENTER): Payer: Self-pay | Admitting: Internal Medicine

## 2021-08-13 ENCOUNTER — Ambulatory Visit
Admission: RE | Admit: 2021-08-13 | Discharge: 2021-08-13 | Disposition: A | Discharge status: Home / Self Care | Payer: PRIVATE HEALTH INSURANCE | Source: Ambulatory Visit

## 2021-08-13 DIAGNOSIS — I10 Essential (primary) hypertension: Secondary | ICD-10-CM

## 2021-08-13 DIAGNOSIS — N184 Chronic kidney disease, stage 4 (severe): Secondary | ICD-10-CM | POA: Insufficient documentation

## 2021-08-13 LAB — BASIC METABOLIC PANEL
Anion Gap: 15.2 mMol/L (ref 7.0–18.0)
BUN / Creatinine Ratio: 13.9 Ratio (ref 10.0–30.0)
BUN: 62 mg/dL — ABNORMAL HIGH (ref 7–22)
CO2: 25 mMol/L (ref 20.0–30.0)
Calcium: 9.3 mg/dL (ref 8.5–10.5)
Chloride: 108 mMol/L (ref 98–110)
Creatinine: 4.46 mg/dL — ABNORMAL HIGH (ref 0.80–1.30)
EGFR: 15 mL/min/{1.73_m2} — ABNORMAL LOW (ref 60–150)
Glucose: 124 mg/dL — ABNORMAL HIGH (ref 71–99)
Osmolality Calculated: 304 mOsm/kg — ABNORMAL HIGH (ref 275–300)
Potassium: 5.2 mMol/L (ref 3.5–5.3)
Sodium: 143 mMol/L (ref 136–147)

## 2021-08-23 ENCOUNTER — Encounter: Payer: Self-pay | Admitting: Surgery

## 2021-08-23 ENCOUNTER — Ambulatory Visit: Payer: PRIVATE HEALTH INSURANCE | Attending: Surgery

## 2021-08-23 DIAGNOSIS — Z01818 Encounter for other preprocedural examination: Secondary | ICD-10-CM

## 2021-08-23 LAB — ECG 12-LEAD
P Wave Axis: 40 deg
P-R Interval: 194 ms
Patient Age: 56 years
Q-T Interval(Corrected): 446 ms
Q-T Interval: 396 ms
QRS Axis: 3 deg
QRS Duration: 108 ms
T Axis: 36 years
Ventricular Rate: 76 //min

## 2021-08-23 LAB — CBC AND DIFFERENTIAL
Basophils %: 0.5 % (ref 0.0–3.0)
Basophils Absolute: 0.1 10*3/uL (ref 0.0–0.3)
Eosinophils %: 1.6 % (ref 0.0–7.0)
Eosinophils Absolute: 0.2 10*3/uL (ref 0.0–0.8)
Hematocrit: 35.2 % — ABNORMAL LOW (ref 39.0–52.5)
Hemoglobin: 11.2 gm/dL — ABNORMAL LOW (ref 13.0–17.5)
Lymphocytes Absolute: 1.8 10*3/uL (ref 0.6–5.1)
Lymphocytes: 18.5 % (ref 15.0–46.0)
MCH: 31 pg (ref 28–35)
MCHC: 32 gm/dL (ref 32–36)
MCV: 96 fL (ref 80–100)
MPV: 10.1 fL — ABNORMAL HIGH (ref 6.0–10.0)
Monocytes Absolute: 0.7 10*3/uL (ref 0.1–1.7)
Monocytes: 7.5 % (ref 3.0–15.0)
Neutrophils %: 71.8 % (ref 42.0–78.0)
Neutrophils Absolute: 7.1 10*3/uL (ref 1.7–8.6)
PLT CT: 224 10*3/uL (ref 130–440)
RBC: 3.68 10*6/uL — ABNORMAL LOW (ref 4.00–5.70)
RDW: 13.2 % (ref 11.0–14.0)
WBC: 9.8 10*3/uL (ref 4.0–11.0)

## 2021-08-23 LAB — BASIC METABOLIC PANEL
Anion Gap: 15.5 mMol/L (ref 7.0–18.0)
BUN / Creatinine Ratio: 12.3 Ratio (ref 10.0–30.0)
BUN: 50 mg/dL — ABNORMAL HIGH (ref 7–22)
CO2: 20 mMol/L (ref 20–30)
Calcium: 9.5 mg/dL (ref 8.5–10.5)
Chloride: 109 mMol/L (ref 98–110)
Creatinine: 4.07 mg/dL — ABNORMAL HIGH (ref 0.80–1.30)
EGFR: 16 mL/min/{1.73_m2} — ABNORMAL LOW (ref 60–150)
Glucose: 99 mg/dL (ref 71–99)
Osmolality Calculated: 293 mOsm/kg (ref 275–300)
Potassium: 4.5 mMol/L (ref 3.5–5.3)
Sodium: 140 mMol/L (ref 136–147)

## 2021-08-23 NOTE — Pre-Procedure Instructions (Signed)
Pt states no cardiopulmonary testing within the last 5 years.   Pt states they can walk a flat street block without SOB or chest pain; not able to walk up stairs or inclines.

## 2021-08-24 ENCOUNTER — Encounter: Payer: Self-pay | Admitting: Surgery

## 2021-08-24 ENCOUNTER — Encounter (INDEPENDENT_AMBULATORY_CARE_PROVIDER_SITE_OTHER): Payer: Self-pay

## 2021-08-24 DIAGNOSIS — R801 Persistent proteinuria, unspecified: Secondary | ICD-10-CM | POA: Insufficient documentation

## 2021-08-24 DIAGNOSIS — E8722 Chronic metabolic acidosis: Secondary | ICD-10-CM | POA: Insufficient documentation

## 2021-08-24 NOTE — Progress Notes (Signed)
Patient was seen at the [facility] and questioned if a PD Catheter was going to be inserted at the time of AVF formation.  The PD Catheter would be inserted when the patient is closer to starting dialysis per Dr. Linzie Collin.  I called and left the patient a VM.

## 2021-08-30 ENCOUNTER — Other Ambulatory Visit (RURAL_HEALTH_CENTER): Payer: Self-pay

## 2021-08-30 DIAGNOSIS — E1165 Type 2 diabetes mellitus with hyperglycemia: Secondary | ICD-10-CM

## 2021-08-30 MED ORDER — FREESTYLE LIBRE 2 SENSOR MISC
1 refills | Status: DC
Start: ? — End: 2021-08-30

## 2021-08-30 MED ORDER — FREESTYLE LIBRE 2 READER DEVI
0 refills | Status: DC
Start: ? — End: 2021-08-30

## 2021-08-30 NOTE — Telephone Encounter (Signed)
Patient called requesting Rx for new Libre reader device, states his is not working properly. Patient has a [place] 14 day, I advised patient that those have been [place] and he will need the upgrade to [place] 2; patient in agreement, please send Rx to [facility] in [place].        BEFORE starting the process to send this refill request to the provider, please check to see if the patient has an appointment within 1 week.  If so, contact the patient. If they have enough to last until that appointment, do NOT route the refill request to the provider.  Refuse the request ("refill not appropriate") with a comment that it will be filled at upcoming appointment.  The patient is due/overdue for relevant labs. There are no open orders. Patient advised that Dr. Duffy Bruce will be ordering labs for upcoming appt and he will need to complete labs prior to appt;  patient in agreement.   Patient has follow up appointment.  I HAVE verified which provider saw the patient last (to send refill request to correct provider).    I verified the patient's preferred pharmacy, and if applicable, I have included diagnosis code for diabetic meds/supplies.  I HAVE verified that this prescription was not recently sent in by provider, and that patient actually needs a refill prior to the upcoming visit. (If there is already a refill on file or the patient has enough to last until the upcoming visit, please let the pharmacy know this is the reason we are not filling the prescription now).

## 2021-08-31 ENCOUNTER — Other Ambulatory Visit (RURAL_HEALTH_CENTER): Payer: Self-pay | Admitting: Family Medicine

## 2021-09-04 ENCOUNTER — Other Ambulatory Visit (RURAL_HEALTH_CENTER): Payer: Self-pay | Admitting: Internal Medicine

## 2021-09-04 DIAGNOSIS — N185 Chronic kidney disease, stage 5: Secondary | ICD-10-CM

## 2021-09-04 DIAGNOSIS — E782 Mixed hyperlipidemia: Secondary | ICD-10-CM

## 2021-09-04 DIAGNOSIS — Z794 Long term (current) use of insulin: Secondary | ICD-10-CM

## 2021-09-06 ENCOUNTER — Encounter: Payer: Self-pay | Admitting: Surgery

## 2021-09-06 ENCOUNTER — Ambulatory Visit: Payer: PRIVATE HEALTH INSURANCE | Admitting: Anesthesiology

## 2021-09-06 ENCOUNTER — Other Ambulatory Visit (RURAL_HEALTH_CENTER): Payer: Self-pay

## 2021-09-06 ENCOUNTER — Ambulatory Visit
Admission: RE | Admit: 2021-09-06 | Discharge: 2021-09-06 | Disposition: A | Discharge status: Home / Self Care | Payer: PRIVATE HEALTH INSURANCE | Source: Ambulatory Visit | Attending: Surgery | Admitting: Surgery

## 2021-09-06 ENCOUNTER — Encounter
Admission: RE | Disposition: A | Discharge status: Home / Self Care | Payer: Self-pay | Source: Ambulatory Visit | Attending: Surgery

## 2021-09-06 DIAGNOSIS — Z992 Dependence on renal dialysis: Secondary | ICD-10-CM | POA: Insufficient documentation

## 2021-09-06 DIAGNOSIS — Z01818 Encounter for other preprocedural examination: Secondary | ICD-10-CM

## 2021-09-06 DIAGNOSIS — N186 End stage renal disease: Secondary | ICD-10-CM | POA: Insufficient documentation

## 2021-09-06 DIAGNOSIS — I120 Hypertensive chronic kidney disease with stage 5 chronic kidney disease or end stage renal disease: Secondary | ICD-10-CM | POA: Insufficient documentation

## 2021-09-06 DIAGNOSIS — E1122 Type 2 diabetes mellitus with diabetic chronic kidney disease: Secondary | ICD-10-CM | POA: Insufficient documentation

## 2021-09-06 HISTORY — PX: FORMATION, UPPER EXTREMITY, A-V FISTULA: SHX4128

## 2021-09-06 LAB — I-STAT CHEM 8 CARTRIDGE
Anion Gap I-Stat: 13 (ref 7.0–16.0)
BUN I-Stat: 75 mg/dL — ABNORMAL HIGH (ref 7–22)
Calcium Ionized I-Stat: 4.6 mg/dL (ref 4.35–5.10)
Chloride I-Stat: 109 mMol/L (ref 98–110)
Creatinine I-Stat: 4.6 mg/dL — ABNORMAL HIGH (ref 0.80–1.30)
EGFR: 14 mL/min/{1.73_m2} — ABNORMAL LOW (ref 60–150)
Glucose I-Stat: 103 mg/dL — ABNORMAL HIGH (ref 71–99)
Hematocrit I-Stat: 37 % — ABNORMAL LOW (ref 39.0–52.5)
Hemoglobin I-Stat: 12.6 gm/dL — ABNORMAL LOW (ref 13.0–17.5)
Potassium I-Stat: 5.7 mMol/L — ABNORMAL HIGH (ref 3.5–5.3)
Sodium I-Stat: 141 mMol/L (ref 136–147)
TCO2 I-Stat: 26 mMol/L (ref 24–29)

## 2021-09-06 LAB — VH DEXTROSE STICK GLUCOSE
Glucose POCT: 107 mg/dL — ABNORMAL HIGH (ref 71–99)
Glucose POCT: 99 mg/dL (ref 71–99)

## 2021-09-06 SURGERY — FORMATION, ARTERIOVENOUS FISTULA, UPPER EXTREMITY
Anesthesia: Anesthesia General | Site: Arm Upper | Laterality: Left | Wound class: Clean

## 2021-09-06 MED ORDER — ONDANSETRON HCL 4 MG/2ML IJ SOLN
INTRAMUSCULAR | Status: DC | PRN
Start: 2021-09-06 — End: 2021-09-06
  Administered 2021-09-06: 4 mg via INTRAVENOUS

## 2021-09-06 MED ORDER — INSULIN LISPRO (1 UNIT DIAL) 100 UNIT/ML SC SOPN
2.0000 [IU] | PEN_INJECTOR | Freq: Once | SUBCUTANEOUS | Status: DC | PRN
Start: 2021-09-06 — End: 2021-09-06

## 2021-09-06 MED ORDER — ACETAMINOPHEN 10 MG/ML IV SOLN
INTRAVENOUS | Status: DC | PRN
Start: 2021-09-06 — End: 2021-09-06
  Administered 2021-09-06: 1000 mg via INTRAVENOUS

## 2021-09-06 MED ORDER — VH HEPARIN 10,000 UNITS/1000 ML NS
INJECTION | INTRAVENOUS | Status: AC | PRN
Start: 2021-09-06 — End: 2021-09-06
  Administered 2021-09-06: 10000 [IU]

## 2021-09-06 MED ORDER — GLYCOPYRROLATE 0.2 MG/ML IJ SOLN (WRAP)
INTRAMUSCULAR | Status: DC | PRN
Start: 2021-09-06 — End: 2021-09-06
  Administered 2021-09-06: .4 mg via INTRAVENOUS

## 2021-09-06 MED ORDER — SODIUM CHLORIDE 0.9 % IV SOLN
INTRAVENOUS | Status: DC
Start: 2021-09-06 — End: 2021-09-06

## 2021-09-06 MED ORDER — FENTANYL CITRATE (PF) 50 MCG/ML IJ SOLN (WRAP)
INTRAMUSCULAR | Status: DC | PRN
Start: 2021-09-06 — End: 2021-09-06
  Administered 2021-09-06: 100 ug via INTRAVENOUS

## 2021-09-06 MED ORDER — VH PHENYLEPHRINE 120 MCG/ML IV BOLUS (ANESTHESIA)
PREFILLED_SYRINGE | INTRAVENOUS | Status: DC | PRN
Start: 2021-09-06 — End: 2021-09-06
  Administered 2021-09-06 (×2): 240 ug via INTRAVENOUS
  Administered 2021-09-06: 120 ug via INTRAVENOUS
  Administered 2021-09-06 (×3): 240 ug via INTRAVENOUS
  Administered 2021-09-06: 120 ug via INTRAVENOUS

## 2021-09-06 MED ORDER — VH PHENYLEPHRINE 120 MCG/ML IV BOLUS (ANESTHESIA)
PREFILLED_SYRINGE | INTRAVENOUS | Status: AC
Start: 2021-09-06 — End: ?
  Filled 2021-09-06: qty 20

## 2021-09-06 MED ORDER — MIDAZOLAM HCL 1 MG/ML IJ SOLN (WRAP)
INTRAMUSCULAR | Status: DC | PRN
Start: 2021-09-06 — End: 2021-09-06
  Administered 2021-09-06: 2 mg via INTRAVENOUS

## 2021-09-06 MED ORDER — LIDOCAINE HCL (PF) 1 % IJ SOLN
INTRAMUSCULAR | Status: AC | PRN
Start: 2021-09-06 — End: 2021-09-06
  Administered 2021-09-06: 30 mL via SUBCUTANEOUS

## 2021-09-06 MED ORDER — VH CEFAZOLIN 3 G IN NS 100 ML IVPB (SIMPLE)
3.0000 g | INTRAVENOUS | Status: AC
Start: 2021-09-06 — End: 2021-09-06
  Administered 2021-09-06: 3 g via INTRAVENOUS

## 2021-09-06 MED ORDER — PROPOFOL 200 MG/20ML IV EMUL
INTRAVENOUS | Status: DC | PRN
Start: 2021-09-06 — End: 2021-09-06
  Administered 2021-09-06: 200 mg via INTRAVENOUS

## 2021-09-06 MED ORDER — OXYCODONE HCL 5 MG PO TABS
5.0000 mg | ORAL_TABLET | Freq: Four times a day (QID) | ORAL | 0 refills | Status: DC | PRN
Start: 2021-09-06 — End: 2021-09-13

## 2021-09-06 MED ORDER — HEPARIN SODIUM (PORCINE) 1000 UNIT/ML IJ SOLN
INTRAMUSCULAR | Status: DC | PRN
Start: 2021-09-06 — End: 2021-09-06
  Administered 2021-09-06: 5000 [IU] via INTRAVENOUS

## 2021-09-06 MED ORDER — SODIUM CHLORIDE 0.9 % IV SOLN
INTRAVENOUS | Status: DC | PRN
Start: 2021-09-06 — End: 2021-09-06

## 2021-09-06 SURGICAL SUPPLY — 24 items
ADHESIVE DERMABOND HIVIC PEN (Dressings) ×2 IMPLANT
CANNISTER SUCTION 3000C (Supply) ×2 IMPLANT
CHLORAPREP W/ORANGE TINT 26ML (Supply) ×1 IMPLANT
DRAPE TOP 102IN X 53IN (Supply) ×2 IMPLANT
DRSG SURGICEL 2 X 14 (Dressings) IMPLANT
DRSG SURGICEL 2 X 3 (Dressings) ×1 IMPLANT
ELECTRODE EZ CLEAN NEEDLE TIP (Supply) ×2 IMPLANT
GEL ULTRASOUND PACKET STERILE (Supply) ×2 IMPLANT
GLOVE BIOGEL UND PI IND SZ 8 (Supply) ×2 IMPLANT
GLOVE BIOGEL UT M PI SURG SZ 8 (Supply) ×2 IMPLANT
GOWN AURORA NONREINF STER XLG (Supply) ×2 IMPLANT
GOWN AURORA REINFOR STER LG (Supply) ×2 IMPLANT
GOWN WARMING  STANDARD BAIRPAW (Supply) ×2 IMPLANT
PACK AVF (Supply) ×2 IMPLANT
PAD ARMBOARD 20 X 8 (Supply) ×2 IMPLANT
QUILT LOWER BODY (Supply) ×2 IMPLANT
SOL WATER STERILE IRRG 1000ML (Supply) ×2 IMPLANT
SPONGE GAUZE 4 X 4 12 PLY (Dressings) ×1 IMPLANT
SPONGE GAUZE 4 X 4 RAYTEC (Supply) IMPLANT
STERISTRIP 1/2 IN X 4 IN (Supply) ×1 IMPLANT
SUT SILK 2-0 A185H (Supply) ×2 IMPLANT
SUT SILK 3-0 A184H (Supply) ×2 IMPLANT
TOWEL BLUE STERILE 6 PER PK (Supply) ×1 IMPLANT
TRAP FLUID FOR SMOKE EVAC (Supply) IMPLANT

## 2021-09-06 NOTE — Transfer of Care (Signed)
Anesthesia Transfer of Care Note    Patient: [name]    Last vitals:   Vitals:    09/06/21 1136   BP: 110/62   Pulse: 75   Resp: 14   Temp: 36.5 C (97.7 F)   SpO2: 98%       Oxygen: Nasal Cannula     Mental Status:awake    Airway: Natural    Cardiovascular Status:  stable    Report to RN

## 2021-09-06 NOTE — Anesthesia Postprocedure Evaluation (Signed)
Post Operative Evaluation    Patient: [name]    Procedures performed: Procedure(s):  LEFT FORMATION, A-V FISTULA, UPPER EXTREMITY    Patient location: PACU    Last vitals:   Vitals:    09/06/21 1230   BP: 128/72   Pulse: 74   Resp: 12   Temp: 36.5 C (97.7 F)   SpO2: 99%       BP: 128/72   Temp: 36.5 C (97.7 F)   Heart Rate: 74   Resp Rate: 12        Post pain: Pain control adequate, continue current therapy     Mental Status:  Awake, alert    Respiratory Function: room air    Cardiovascular: stable    Nausea/Vomiting: therapy adequate; appropriate medications received    Hydration Status: adequate    Post assessment: no apparent anesthetic complications, no reportable events and no evidence of recall

## 2021-09-06 NOTE — Discharge Instr - AVS First Page (Signed)
Discharge Instructions: After Your Surgery  You've just had surgery. During surgery, you were given medicine called anesthesia to keep you relaxed and free of pain. After surgery, you may have some pain or nausea. This is common. Here are some tips for feeling better and getting well after surgery.     Stay on schedule with your medicine.   Going home  Your healthcare provider will show you how to take care of yourself when you go home. He or she will also answer your questions. Have an adult family member or friend drive you home. For the first 24 hours after your surgery:  Don't drive or use heavy equipment.  Don't make important decisions or sign legal papers.  Don't drink alcohol.  Have someone stay with you, if needed. He or she can watch for problems and help keep you safe.  Be sure to go to all follow-up visits with your healthcare provider. And rest after your surgery for as long as your healthcare provider tells you to.  Coping with pain  If you have pain after surgery, pain medicine will help you feel better. Take it as told, before pain becomes severe. Also, ask your healthcare provider or pharmacist about other ways to control pain. This might be with heat, ice, or relaxation. And follow any other instructions your surgeon or nurse gives you.  Tips for taking pain medicine  To get the best relief possible, remember these points:  Pain medicines can upset your stomach. Taking them with a little food may help.  Most pain relievers taken by mouth need at least 20 to 30 minutes to start to work.  Don't wait till your pain becomes severe before you take your medicine. Try to time your medicine so that you can take it before starting an activity. This might be before you get dressed, go for a walk, or sit down for dinner.  Constipation is a common side effect of pain medicines. Call your healthcare provider before taking any medicines such as laxatives or stool softeners to help ease constipation. Also ask if  you should skip any foods. Drinking lots of fluids and eating foods such as fruits and vegetables that are high in fiber can also help. Remember, don't take laxatives unless your surgeon has prescribed them.  Drinking alcohol and taking pain medicine can cause dizziness and slow your breathing. It can even be deadly. Don't drink alcohol while taking pain medicine.  Pain medicine can make you react more slowly to things. Don't drive or run machinery while taking pain medicine.  Your healthcare provider may tell you to take acetaminophen to help ease your pain. Ask him or her how much you are supposed to take each day. Acetaminophen or other pain relievers may interact with your prescription medicines or other over-the-counter (OTC) medicines. Some prescription medicines have acetaminophen and other ingredients. Using both prescription and OTC acetaminophen for pain can cause you to overdose. Read the labels on your OTC medicines with care. This will help you to clearly know the list of ingredients, how much to take, and any warnings. It may also help you not take too much acetaminophen. If you have questions or don't understand the information, ask your pharmacist or healthcare provider to explain it to you before you take the OTC medicine.  Managing nausea  Some people have an upset stomach after surgery. This is often because of anesthesia, pain, or pain medicine, or the stress of surgery. These tips will help you handle   nausea and eat healthy foods as you get better. If you were on a special food plan before surgery, ask your healthcare provider if you should follow it while you get better. These tips may help:  Don't push yourself to eat. Your body will tell you when to eat and how much.  Start off with clear liquids and soup. They are easier to digest.  Next try semi-solid foods, such as mashed potatoes, applesauce, and gelatin, as you feel ready.  Slowly move to solid foods. Don't eat fatty, rich, or spicy  foods at first.  Don't force yourself to have 3 large meals a day. Instead eat smaller amounts more often.  Take pain medicines with a small amount of solid food, such as crackers or toast, to prevent nausea.  When to call your healthcare provider  Call your healthcare provider if:  You still have intolerable pain an hour after taking medicine. The medicine may not be strong enough.  You feel too sleepy, dizzy, or groggy. The medicine may be too strong.  You have side effects such as nausea or vomiting, or skin changes such as rash, itching, or hives. Your healthcare provider may suggest other medicines to control side effects.  Rash, itching, or hives may mean you have an allergic reaction. Report this right away. If you have trouble breathing or facial swelling, call 911 right away.  If you have obstructive sleep apnea  You were given anesthesia medicine during surgery to keep you comfortable and free of pain. After surgery, you may have more apnea spells because of this medicine and other medicines you were given. The spells may last longer than usual.   At home:  Keep using the continuous positive airway pressure (CPAP) device when you sleep. Unless your healthcare provider tells you not to, use it when you sleep, day or night. CPAP is a common device used to treat obstructive sleep apnea.  Talk with your provider before taking any pain medicine, muscle relaxants, or sedatives. Your provider will tell you about the possible dangers of taking these medicines.        Special Instructions:    You may remove the bandage on your incision or incisions in 2 days and it may get wet with a sponge bath or showering.    Under the bandage are steri-strips.    Please leave these in place until your follow up appointment. If they fall off that is okay.     If you have any questions or problems please call the office at [phone].    Please schedule a follow up appointment in 2 weeks by calling [phone].      You may  start oral intake immediately. You may start with liquids and progress to your usual diet as tolerated.    Avoid driving, important decision making, or operating machinery for 24 hours after surgery or while taking narcotic pain medications.    You may return to work when able. Please let the office know if you need any paperwork completed.    No lifting > 10 pounds for 2 weeks.    Some bruising is normal.     Exercise your hand with a ball or other device at least 20 minutes six times a day. This will help the fistula develop and improve circulation to your hand. You can do this more frequently.      You have a prescription for narcotic pain medicine.    You can use Tylenol as well. Follow   the package instructions.        You may develop constipation. You can take Metamucil and  Colace over the counter.         Arteriovenous (AV) Fistula for Dialysis  An AV fistula is a connection between an artery and a vein. For this procedure, an AV fistula is surgically made using an artery and a vein in your arm. (Your healthcare provider will let you know if another site is to be used.) When the artery and vein are joined, blood flow increases from the artery into the vein. As a result, the vein gets bigger over time. The enlarged vein provides easier access to the blood for a treatment for kidney failure (dialysis). This sheet explains the procedure and what to expect.     An AV fistula increases blood flow from the artery into the vein. Over time, the vein becomes stronger and enlarged.     After the procedure:  You'll be asked to keep your arm raised (elevated) as often as possible for at least a week after the procedure.  You'll be given medicines for pain as needed.  Your arm and hand will be checked to make sure blood is flowing through the fistula properly. The feeling of blood rushing through the fistula is called a thrill. It is somewhat like the purring of a cat. You'll be taught how to check for this feeling each  day to make sure there are no problems with your fistula. You'll also be taught how to care for your fistula at home.  When it's time for you to leave the hospital, have an adult family member or friend ready to drive you home.  Recovering at home  Once at home, follow all the instructions you've been given. Be sure to:  Take all medicines as directed.  Care for your incision as instructed.  Check for signs of infection at the incision site (see below).  Don't do any heavy lifting and strenuous activities as directed.  Monitor and care for your fistula as instructed.  Do your hand and arm exercises as instructed. This often involves squeezing a ball in your hand for a few minutes each hour.  When to call your healthcare provider  Fever of 100.4F ( 38C) or higher or as directed  Signs of infection at the incision site, such as increased redness or swelling, warmth, worsening pain, bleeding, or bad-smelling drainage  You can't feel a thrill (the vibration of blood going through your arm)  Pain or numbness in your fingers, hand, or arm  Bleeding, redness, or warmth around your fistula  Sudden bulging of the fistula that is more than usual (a slight bulge is normal     Follow-up  Your healthcare provider will check your fistula within 1 to 2 weeks after the procedure. It will likely take about 6 to 8 weeks for the fistula to get big enough to start dialysis. After that, make sure the fistula is checked each time you have dialysis. Your healthcare provider may also suggest checkups every 6 months.  Risks and possible complications  The fistula not working right  Long wait before the fistula is ready (up to 6 months)  Coldness or numbness in the hand (due to blood flowing away from the hand and into the fistula)  An unsightly bump under the skin (due to enlargement of the fistula)  Prolonged bleeding from the fistula after dialysis  Narrowing or weakening of the blood vessels used for the   fistula  Forming of blood clots  in the blood vessels used for the fistula  Risks of anesthesia or any other medicines used during the procedure     Living with an AV fistula  A problem, such as a narrowing (stricture) of the vein or an infection, can make the fistula unusable. If this happens, you may need other treatments to fix or make a new fistula. To protect your fistula, follow these and any other guidelines you're given:  Check your fistula as often as your healthcare provider says. If you can't feel your thrill, let your provider know right away.  Make sure your fistula is checked before each dialysis treatment.  Don't let anyone draw blood from or take blood pressure on the arm that has the fistula.  Wash your hands often and keep the area around your fistula clean.  Don't sleep on the arm that has the fistula.  Don't wear tight jewelry or a watch on the arm with your fistula.  Protect your fistula from cuts, scrapes, or blows.

## 2021-09-06 NOTE — Op Note (Signed)
FULL OPERATIVE NOTE    Date Time: 09/06/21 11:29 AM  Patient Name: [name]  Attending Physician: Franklyn Lor, MD      Date of Operation:   09/06/2021    Providers Performing:   Surgeon(s):  Thoma Paulsen, Annitta Jersey, MD    Anesthesiologist: Gwendolyn Lima, MD  CRNA: Burns Spain, CRNA    Circulator: Johny Shock, RN  Relief Scrub: Christena Flake  Scrub Person: Day, Quay Burow  Preceptor: Deitra Mayo, RN  Surgical Resident: Merlene Laughter, MD    Anesthesia/Sedation:   general    Operative Procedure:   Procedure(s):  LEFT FORMATION, A-V FISTULA, UPPER EXTREMITY - Wound Class: Clean     Preoperative Diagnosis:   Pre-Op Diagnosis Codes:     * Encounter regarding vascular access for dialysis for end-stage renal disease [N18.6, Z99.2]    Postoperative Diagnosis:   Post-Op Diagnosis Codes:     * Encounter regarding vascular access for dialysis for end-stage renal disease [N18.6, Z99.2]    Findings:   Good caliber vessels     Indications:   Same    Operative Notes:   After obtaining consent the patient was taken to the operating room and placed upon the operating room table in the supine position. After the induction of general anesthesia, the patient was prepped and draped in the usual sterile fashion using chlora-prep. An incision was made in the left antecubital fossa and the brachial artery and cephalic vein were dissected free from surrounding structures and controlled proximally and distally with vessel loops. The vein and artery were of good caliber. The patient was anticoagulated with 5000 units of Heparin. The vein was divided at several branching points low in the antecubital fossa.  The artery was opened with an 11 blade and potts scissors were used to make an arteriotomy. The anastomosis was performed with running 6-0 prolene. After completion of the anastomosis hemostasis was ensured. There was good flow through the fistula. The patient tolerated the procedure well.  After ensuring hemostasis the incision was closed with a deep layer of 3-0 vicryl and a superficial layer of 4-0 vicryl. Dressings were applied. The patient was taken to recovery in stable condition.     Estimated Blood Loss:    15 mL    Implants:   * No implants in log *    Drains:   Drains: no    Specimens:   * No specimens in log *      Complications:   * No complications entered in OR log *      Signed by: Franklyn Lor, MD, MD

## 2021-09-06 NOTE — Anesthesia Preprocedure Evaluation (Signed)
Anesthesia Evaluation    AIRWAY    Mallampati: III    TM distance: <3 FB  Neck ROM: full  Mouth Opening:full   CARDIOVASCULAR    cardiovascular exam normal, regular and normal       DENTAL                     PULMONARY    pulmonary exam normal and clear to auscultation     OTHER FINDINGS                                      Relevant Problems   CARDIO   (+) Essential hypertension      GI   (+) GERD (gastroesophageal reflux disease)      GU/RENAL   (+) Diabetic nephropathy associated with type 2 diabetes mellitus   (+) Encounter regarding vascular access for dialysis for end-stage renal disease   (+) Renal osteodystrophy   (+) Stage 5 chronic kidney disease      ENDO   (+) Type 2 diabetes mellitus with diabetic chronic kidney disease      OTHER   (+) Charcot's joint of foot       PSS Anesthesia Comments:     NPO. No chest pain, SOB, nausea, GERD, smoking, vaping.  Not diagnosed with OSA - however patient has to sleep sitting and wakes up multiple times a night due to obstruction.  Had sedation for a foot case and his restless leg was so bad that he had to convert to general.  For that reason will plan general.        Anesthesia Plan    ASA 3     general                                 informed consent obtained    ECG reviewed  pertinent labs reviewed             Signed by: Gwendolyn Lima, MD 09/06/21 8:08 AM

## 2021-09-06 NOTE — Progress Notes (Signed)
Attempted to call patient and received a recording that the "call did not go through, please try your call again later."  Nelia Shi, RN, [place]

## 2021-09-06 NOTE — Progress Notes (Addendum)
Patient discharged home. Instructions and medications reviewed; patient verbalized understanding and denies any additional questions. VSS. IV removed--catheter intact. All discharge criteria met.

## 2021-09-06 NOTE — H&P (Signed)
History and Physical  Rhunette Croft, MD  Blanchard Mane, MD  [address]  [place]  [place],  [place] [address]     09/06/2021       CC: Here for AV fistula    HPI:  [name] is a 56 y.o. male who presents for  dialysis access. 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         Past Medical History     Allergies:        Allergies   Allergen Reactions    Influenza Virus Vaccine Anaphylaxis         Illness:   Past Medical History        Past Medical History:   Diagnosis Date    Abnormal vision      Arthritis      Complication of anesthesia       last time he was kicking his legs had to wake him up    Diabetes mellitus      Diabetic ulcer of left foot 12/26/2014    Disc      End-stage renal disease 2021     stage 5 (2023)    Gastroesophageal reflux disease      Hyperlipidemia      Hypertension      Low back pain      Neuropathy      Seasonal allergic rhinitis      Sleep apnea       not tested    Type 2 diabetes mellitus, controlled              Operations:   Past Surgical History         Past Surgical History:   Procedure Laterality Date    ARTHRODESIS, TOE Left 05/04/2015     Procedure: ARTHRODESIS, TOE;  Surgeon: Janora Norlander, DPM;  Location: [facility];  Service: Podiatry;  Laterality: Left;  1st MPJ FUSION    DEBRIDEMENT & IRRIGATION, LOWER EXTREMITY Left 03/12/2015     Procedure: [place], LOWER EXTREMITY;  Surgeon: Janora Norlander, DPM;  Location: [facility];  Service: Podiatry;  Laterality: Left;  I&D LEFT FOOT FOR BONE INFECTION    DEBRIDEMENT & IRRIGATION, LOWER EXTREMITY Left 03/07/2016     Procedure: DEBRIDEMENT & IRRIGATION, LEFT FOOT 2ND TOE, EXCISION OF 2ND METATARSEL;  Surgeon: Janora Norlander, DPM;  Location: [facility];  Service: Podiatry;  Laterality: Left;  I&D Left Foot    FOOT SURGERY Right 05/2020     [facility]    HAND SURGERY        JOINT REPLACEMENT        ORIF, PATELLA Left 09/21/2020     Procedure: ORIF, LEFT KNEE PATELLA;   Surgeon: Eugenie Filler, MD;  Location: [facility];  Service: Orthopedics;  Laterality: Left;    ORIF, PATELLA Left 12/24/2020     Procedure: LEFT PATELLA HARDWARE REVISION / ORIF;  Surgeon: Marlin Canary, MD;  Location: [facility];  Service: Orthopedics;  Laterality: Left;    SESAMOIDECTOMY Left 05/24/2019     Procedure: SESAMOIDECTOMY;  Surgeon: [name], DPM;  Location: [facility];  Service: Podiatry;  Laterality: Left;            Medications:   Current Outpatient Medications:     aspirin EC 81 MG EC tablet, Take 1 tablet (81 mg total) by mouth every evening., Disp: 90 tablet, Rfl: 3  atorvastatin (LIPITOR) 40 MG tablet, TAKE 1 TABLET BY MOUTH ONCE DAILY, Disp: 90 tablet, Rfl: 2    Continuous Blood Gluc Sensor (FreeStyle Libre 14 Day Sensor) Misc, Use 1 each every 14 (fourteen) days, Disp: 6 each, Rfl: 2    continuous blood glucose receiver (DEXCOM G6) device, Use as directed per manufacturer instructions, Disp: 1 each, Rfl: 0    continuous blood glucose sensor (DEXCOM G6) device, Inject 1 Device into the skin every 10 (ten) days, Disp: 9 each, Rfl: 3    continuous blood glucose transmitter (DEXCOM G6) device, Use as directed every 90 days per manufacturer instructions, Disp: 1 each, Rfl: 3    ergocalciferol (ERGOCALCIFEROL) 1.25 MG (50000 UT) capsule, One capsule weekly x4 weeks, then one capsule every other week, Disp: 4 capsule, Rfl: 2    furosemide (Lasix) 40 MG tablet, Take 1 tablet (40 mg) by mouth daily, Disp: 30 tablet, Rfl: 1    gabapentin (NEURONTIN) 300 MG capsule, Take 1 capsule (300 mg) by mouth 3 (three) times daily, Disp: 270 capsule, Rfl: 1    glucose blood test strip, 1 each by Other route 4 (four) times daily Use as instructed, Disp: , Rfl:     insulin aspart (NovoLOG FlexPen) 100 UNIT/ML injection pen, 6 units breakfast and lunch, 10 units supper + correction up to 50 units per day, Disp: 45 mL, Rfl: 0    insulin glargine (LANTUS SOLOSTAR) 100 UNIT/ML  injection pen, Inject 68 Units into the skin daily, Disp: 75 mL, Rfl: 0    Insulin Pen Needle 31G X 6 MM Misc, Use Use up to 7 times a day as directed, Disp: , Rfl:     liraglutide (Victoza) 18 MG/3ML injection, INJECT 1.8MG  SUBCUTANEOUSLY ONCE DAILY, Disp: 27 mL, Rfl: 3    metoprolol tartrate (LOPRESSOR) 50 MG tablet, Take 1 tablet by mouth twice daily, Disp: 180 tablet, Rfl: 0    Multiple Vitamins-Minerals (Multi For Him 50+) Tab, Take by mouth, Disp: , Rfl:     Vitamin D3 (CHOLECALCIFEROL) 50 MCG (2000 UT) tablet, Take 1 tablet (50 mcg) by mouth daily, Disp: , Rfl:      Social History:   Social History               Socioeconomic History    Marital status: Married   Occupational History    Occupation: unemployed   Tobacco Use    Smoking status: Some Days       Packs/day: 0.25       Types: Cigarettes       Last attempt to quit: 09/06/2017       Years since quitting: 3.8    Smokeless tobacco: Never    Tobacco comments:       3 packs a week   Vaping Use    Vaping status: Never Used   Substance and Sexual Activity    Alcohol use: No       Alcohol/week: 0.0 standard drinks of alcohol       Comment: in past    Drug use: No       Comment: in past    Sexual activity: Yes       Partners: Female       Birth control/protection: None      Social Determinants of Health           Food Insecurity: No Food Insecurity (09/11/2020)     Hunger Vital Sign      Worried About Running Out of  Food in the Last Year: Never true      Ran Out of Food in the Last Year: Never true            Family History   Family History         Family History   Problem Relation Age of Onset    Diabetes Mother      Lung cancer Mother      Cancer Mother           lung    Depression Sister      COPD Sister      Cancer Sister           breast    Hypertension Brother              Review of Systems   Constitutional: Negative for fever, chills, diaphoresis, activity change, appetite change, fatigue and unexpected weight change.   HEENT: Negative for hearing loss,  ear pain, nosebleeds, congestion, facial swelling, rhinorrhea, sneezing, neck pain, neck stiffness, postnasal drip, tinnitus and ear discharge.    Eyes: Negative for photophobia, pain, discharge, redness, itching and visual disturbance.   Respiratory: Negative for apnea, cough, choking, chest tightness, shortness of breath, wheezing and stridor.    Cardiovascular: Negative for chest pain, palpitations and leg swelling.   Gastrointestinal: Negative for abdominal distention, pain, bleeding, change in bowel habits, or any other symptoms.   Genitourinary: Negative for dysuria, urgency, frequency, hematuria, flank pain, decreased urine volume,  Musculoskeletal: Negative for back pain, joint swelling, arthralgias and gait problem.   Neurological: Negative for dizziness, tremors, seizures, syncope, facial asymmetry, speech difficulty, weakness, light-headedness, numbness and headaches.   Hematological: Negative for adenopathy. Does not bruise/bleed easily.   Psychiatric/Behavioral: Negative for hallucinations, behavioral problems, confusion, dysphoric mood, decreased concentration and agitation.         PE:  BP 157/75   Pulse 84   Temp 97 F (36.1 C) (Skin)   Resp 16   Ht 1.676 m (5\' 6" )   Wt 120.8 kg (266 lb 5.1 oz)   SpO2 97%   BMI 42.98 kg/m     General: Alert, oriented X3, in no acute distress  HEENT : NC/AT. PERRLA. Sclera non-icteric  Neck: Supple, no lymphadenopathy, no bruits  Lungs: CTA  CV: RRR S1 and S2 normal  Abdomen: soft, non tender, non-distended, no masses, no hernia  Extremities:  no clubbing, cyanosis, or edema  Skin no suspicious lesions  Neuro: No focal deficits     Ultrasound vein mapping was reviewed     Assessment/Plan:     1. CKD (chronic kidney disease) stage 5, GFR less than 15 ml/min          2. Encounter regarding vascular access for dialysis for end-stage renal disease             Plan left upper arm AV fistula     The risks, benefits, and alternatives to placement of an AV fistula or  graft were discussed with the patient. Informed consent (infection, bleeding, anesthesia risks, need for further operations/procedures, injury to adjacent structures, failure of the fistula/graft, thrombosis, need for secondary procedures, Steal Syndrome, loss of limb function, nerve injury ) was obtained.

## 2021-09-08 ENCOUNTER — Encounter: Payer: Self-pay | Admitting: Surgery

## 2021-09-13 ENCOUNTER — Ambulatory Visit: Payer: PRIVATE HEALTH INSURANCE | Admitting: Internal Medicine

## 2021-09-13 ENCOUNTER — Encounter (RURAL_HEALTH_CENTER): Payer: Self-pay

## 2021-09-13 VITALS — BP 130/50 | HR 80 | Wt 261.0 lb

## 2021-09-13 DIAGNOSIS — Z978 Presence of other specified devices: Secondary | ICD-10-CM

## 2021-09-13 DIAGNOSIS — E782 Mixed hyperlipidemia: Secondary | ICD-10-CM

## 2021-09-13 DIAGNOSIS — N185 Chronic kidney disease, stage 5: Secondary | ICD-10-CM

## 2021-09-13 DIAGNOSIS — E1121 Type 2 diabetes mellitus with diabetic nephropathy: Secondary | ICD-10-CM

## 2021-09-13 DIAGNOSIS — N2581 Secondary hyperparathyroidism of renal origin: Secondary | ICD-10-CM

## 2021-09-13 DIAGNOSIS — Z794 Long term (current) use of insulin: Secondary | ICD-10-CM

## 2021-09-13 DIAGNOSIS — E1165 Type 2 diabetes mellitus with hyperglycemia: Secondary | ICD-10-CM

## 2021-09-13 MED ORDER — LANTUS SOLOSTAR 100 UNIT/ML SC SOPN
PEN_INJECTOR | SUBCUTANEOUS | 0 refills | Status: DC
Start: ? — End: 2021-09-13

## 2021-09-13 MED ORDER — FREESTYLE LIBRE 2 SENSOR MISC
1 refills | Status: DC
Start: ? — End: 2021-09-13

## 2021-09-13 MED ORDER — NOVOLOG FLEXPEN 100 UNIT/ML SC SOPN
PEN_INJECTOR | SUBCUTANEOUS | 0 refills | Status: DC
Start: ? — End: 2021-09-13

## 2021-09-13 NOTE — Progress Notes (Signed)
Endocrine Provider Progress Note      Patient Name:  [name] [[id number]] DOB: 1965-04-23  Date: 09/13/2021    Subjective:          [name] is a 56 y.o. male who presents for follow up of type 2 diabetes mellitus.   Since the last visit, the patient had a fistula placed in (L) arm last week for hemodialysis if needed.  He plans to go on peritoneal dialysis when the time comes.        He is now ambulating with a cane.    He finds his Elenor Legato sensor is not accurate.  He has a Risk analyst 2 reader but states he needs a prescription for the [place] 2 sensors.     Monitoring, Compliance and Complications:  Diagnosis Date: ~2000-2001  Prior Diabetic Treatment: Wilder Glade (discontinued due to renal function) patient reports Wilder Glade was stopped during hospitalization for osteomyelitis.   Hgb A1c at time of referral: 11.1% (done 12/20/16).  HGB A1C goal based on age and comorbidities is 7%.  The patient has the following diabetic complications:  retinopathy, nephropathy and neuropathy, history of foot ulcer.  He has had multiple surgeries for osteomyelitis on his left foot in 2017.     Glucose Meter: Reli-On, not sure which model  Glucose Meter Validation Date: never  the patient uses finger stick BG monitoring when CGM is not in use    .    Marland Kitchen    CGM Type:  Dexcom G6  Percent of time in target range: 93%  For additional CGM data, see scanned CGM download attached to this visit note.   Average sensor glucose: 121  Percent of time above target range: 3% high  Percent of time below target range: 3% low with 1% very low      CGM tracings from 08/31/21 to 09/13/21 were reviewed today.   Tracings show overall good control.  See adjustments in regimen as detailed below.     Injections:   Injections are given by patient.  Pre-meal Novolog 6 units ACB; 6 units ACL; 10 units ACS plus adds correction scale 2 units every 50 points BG >150  Injections sites include: abdominal wall  Injection compliance: The patient never misses  injections.  No problems noted with injection sites.      Diet, Exercise, and Weight:     The patient tries to watch portions, starches and sweets.  Compliance with diet has been variable.   Exercise: The patient does not exercise.   Weight: Weight has increased 10.5 lbs since last visit.     Hypoglycemia:   The patient has experienced mild occasional hypoglycemia since the last visit. Symptoms of hypoglycemia include: hunger and shaking, blurred vision, nausea. . The patient treats hypoglycemia with carb snack, glucose tablets     The patient has Nasal Glucagon (Baqsimi). This is up-to-date and family members have been instructed in its use.   The patient does not have a medical alert bracelet or necklace.    Diabetic Education: The patient has received diabetes education, most recently 08/13/2020.          Diabetic Complication Screening/Treatment  Eye exam: Dr. Deirdre Pippins. Last visit was 03/17/2020.  No evidence of diabetic retinopathy on last eye exam.  Urine microalbumin/creatinine ratio: N/A. The patient has CKD followed by nephrologist.    Foot Care: The patient reports continued problems with  neuropathic pain  Has followed with Podiatry; Dr. Nada Boozer   Monofilament:  03/30/2020,  not intact   Aspirin Therapy: The patient is currently taking aspirin at dose indicated in the medication list.   Cardiovascular risk factors: advanced age (older than 68 for men, 93 for women), diabetes mellitus, dyslipidemia, hypertension, male gender, obesity (BMI >= 30 kg/m2), sedentary lifestyle and smoking/ tobacco exposure  Ace Inhibitor/ARB: ACE-I/ARB is contraindicated due to  CKD.  Dental care: Patient has received regular dental care, most recently Feb-March 2021  Had 4 teeth extracted    Current Outpatient Medications   Medication Sig Dispense Refill    amLODIPine (NORVASC) 10 MG tablet Take 1 tablet (10 mg) by mouth daily      aspirin EC 81 MG EC tablet Take 1 tablet (81 mg total) by mouth every evening. 90 tablet 3     atorvastatin (LIPITOR) 40 MG tablet TAKE 1 TABLET BY MOUTH ONCE DAILY 90 tablet 2    azelastine (ASTELIN) 0.1 % nasal spray 1 spray by Nasal route 2 (two) times daily Use in each nostril as directed (Patient taking differently: 1 spray by Nasal route 2 (two) times daily as needed Use in each nostril as directed) 30 mL 3    furosemide (LASIX) 40 MG tablet TAKE 1 TABLET BY MOUTH ONCE DAILY 30 tablet 1    gabapentin (NEURONTIN) 300 MG capsule Take 1 capsule (300 mg) by mouth 3 (three) times daily 270 capsule 1    glucose blood test strip 1 each by Other route 4 (four) times daily Use as instructed      hydrALAZINE (APRESOLINE) 50 MG tablet Take 1 tablet (50 mg) by mouth daily      Insulin Pen Needle 31G X 6 MM Misc Use Use up to 7 times a day as directed      liraglutide (Victoza) 18 MG/3ML injection INJECT 1.8MG  SUBCUTANEOUSLY ONCE DAILY 27 mL 3    metoprolol tartrate (LOPRESSOR) 50 MG tablet Take 1 tablet (50 mg) by mouth 2 (two) times daily 180 tablet 3    Multiple Vitamins-Minerals (Multi For Him 50+) Tab Take by mouth daily      sodium bicarbonate 650 MG tablet Take 1 tablet (650 mg) by mouth 2 (two) times daily      vitamin D (CHOLECALCIFEROL) 25 MCG (1000 UT) tablet Take 2 tablets (2,000 Units) by mouth 2 (two) times daily      Continuous Blood Gluc Receiver (FreeStyle [place] 2 Reader) Device Use as directed to monitor blood sugar;  E11.65 (Patient not taking: Reported on 09/13/2021) 1 each 0    Continuous Blood Gluc Sensor (FreeStyle Libre 2 Sensor) Misc Use as directed to monitor blood sugar; change every 14 days;  E11.65 6 each 1    continuous blood glucose transmitter (DEXCOM G6) device Use as directed every 90 days per manufacturer instructions (Patient not taking: Reported on 07/30/2021) 1 each 3    ergocalciferol (ERGOCALCIFEROL) 1.25 MG (50000 UT) capsule One capsule weekly x4 weeks, then one capsule every other week (Patient not taking: Reported on 08/23/2021) 4 capsule 2    insulin aspart (NovoLOG FlexPen)  100 UNIT/ML injection pen 6 units breakfast and lunch, 10 units supper + correction up to 50 units per day 45 mL 0    insulin glargine (LANTUS SOLOSTAR) 100 UNIT/ML injection pen Inject 68 Units into the skin daily 75 mL 0     No current facility-administered medications for this visit.      Medication review with Janie Morning suggested compliance all of the time.  Immunization History   Administered Date(s) Administered    COVID-19 mRNA BIVALENT vaccine 12 years and above [employer]) 30 mcg/0.3 mL 11/16/2020    COVID-19 mRNA MONOVALENT vaccine PRIMARY SERIES 12 years and above [employer]) 30 mcg/0.3 mL (DILUTE BEFORE USE) 10/04/2019, 10/25/2019, 08/11/2020    COVID-19 mRNA MONOVALENT vaccine PRIMARY SERIES 12 years and above [employer]) 30 mcg/0.3 mL (DO NOT DILUTE) 03/25/2020    Pneumococcal 23 valent 08/11/2020    Td 06/14/2009    Td,preservative free 06/14/2009    Tdap 07/06/2011     The following portions of the patient's history were reviewed and updated as appropriate: current medications, past medical history and problem list.  The following information was also reviewed at today's visit: lab data and continuous glucose sensor download data    Review of Systems  As above.      Objective:      Vital Signs: BP 130/50 (BP Site: Right arm, Patient Position: Sitting, Cuff Size: Large)   Pulse 80   Wt 118.4 kg (261 lb)   BMI 42.13 kg/m      PE  Well nourished, well appearing, in no acute distress.  HEENT:  There is a slight stare.  There is no lid lag. There is no periorbital edema. Extraoccular movements are intact. No conjunctival injection.  oral mucosa not examined. Patient wearing mask.  Neck is supple without adenopathy.  The thyroid is normal in size and consistency and no nodules are appreciated. The thyroid is normal on inspection.  Lungs are clear to auscultation.  Cardiac exam reveals a regular rate and rhythm. No murmur or gallop is appreciated.      Exam of the remedies reveals +1 pitting  edema at the ankle bilaterally.Marland Kitchen     Posterior tibial and doralis pedis pulses are normal.on left foot  Callouses are noted heels. Dry skin  is noted on left plantar/dorsal surface Onychomycosis is noted on nails of left foot .  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  On neurologic exam, the patient is alert and appropriate. Gait and speech are normal.   Skin is warm and dry.     Lab Review    Lab Results   Component Value Date    HGBA1CPERCNT 7.3 04/22/2021    HGBA1CPERCNT 7.2 01/27/2021    HGBA1CPERCNT 7.6 08/07/2020    TSH 1.33 10/19/2019    CHOL 191 04/22/2021    HDL 33 (L) 04/22/2021    LDL  04/22/2021     Unable to calculate due to triglycerides >400 mg/dl    TRIG 438 (H) 04/22/2021    ALT 23 07/05/2021    B12 419 09/13/2017    VITD 15.2 (L) 07/05/2021    GLU 99 08/23/2021    K 4.5 08/23/2021    CA 9.5 08/23/2021    CREAT 4.60 (H) 09/06/2021    CO2 20 08/23/2021    EGFR 14 (L) 09/06/2021    NA 140 08/23/2021    ALKPHOS 124 07/05/2021       03/09/19 vitamin D2/D3 total 21 (normal 20-50)    Assessment:      1. Diabetic nephropathy associated with type 2 diabetes mellitus        2. CKD (chronic kidney disease) stage 5, GFR less than 15 ml/min        3. CGM Dex Com        4. Mixed hyperlipidemia        5. Type 2 diabetes mellitus with hyperglycemia, with long-term current use  of insulin  Continuous Blood Gluc Sensor (FreeStyle Libre 2 Sensor) Misc      6. Secondary hyperparathyroidism          Diabetes: Diabetes is suboptimally controlled. Discussed specific areas of diabetes management as detailed under patient instructions below.  Sent a new prescription for [place] 2 sensors.      Lipids: We have reviewed that the triglycerides are above goal. We have discussed that they are likely to improve with decreased intake of simple sugars and improved diabetic control.  Ask him to meet with the diabetic educator to review changes in diet that will help this.    CKD with uncontrolled HTN and edema: will add furosemide  40mg  daily until he sees renal.  I have referred him to renal to try to expedite his follow-up there.  I will also order the usual renal labs for them so they will be available at his appointment.    Vitamin D deficiency: We will was low when last checked in June 2022 we will check this with his upcoming diabetic labs.          Plan:   Below is a summary of information and instructions we reviewed at today's appointment. Please review this information carefully and call if you have any questions regarding it.    Directions for your next appointment:   Diabetic lab work is generally needed every 3 months.  Please see specific instructions below about when your lab work is due.  Please make every effort to have your lab work done prior to your appointment so we can review it at the appointment.  If you do not have the labs done before the visit, we may need to reschedule your appointment.  If you are using a glucose meter, please bring your meter to the appointment.  If you are using a continuous glucose monitor (Dexcom or libre) device, please bring this to your appointment so that your data can be reviewed at the appointment.      Diabetes information/instructions:   Labwork: You are due for lab work  now.  You should fast for 10 hours prior to having this labwork done.    Self-monitoring of blood glucose: We recommend that you monitor your blood glucose and as needed for signs and/or symptoms of hypoglycemia.  Important general information about using a continuous glucose sensor (CGM): Do not inject insulin within 1-3 inches of the sensor. Remove sensor if you are having MRI, CT, X-ray. Rotate sensor sites. If you stop wearing your CGM for a period of time, please resume fingerstick blood sugar checks at least 4 times a day.   Please contact [employer]  if you need  help with troubleshooting.  We will send in new prescription for [facility] 2 sensors.    Diabetic Medication/Insulin Instructions:  Continue current  insulin regimen for now.        Low Blood Sugar Prevention and Treatment:   Please carry fast-acting carbohydrate source (preferably glucose tablets) with you at all times. Please treat low blood sugar with 15 grams carbohydrate (4 glucose tablets), wait 15 minutes, and recheck sugar.  Re-treat if necessary.   Please notify office for any episode of severe hypoglycemia, such as those requiring assistance from someone else.     Aspirin: Please continue taking low-dose aspirin.    Diet:   Please continue your current diet.     Activity:   As tolerated    Foot Care:   Please examine feet  daily and appropriately treat callouses or lesions. Please avoid being barefoot or wearing of ill fitting shoes. Please use lotion as needed to treat dry skin on feet.     Diabetic Education:   We did not discuss diabetic education today. If you would like to follow up with a diabetic educator, please call the office and we will arrange for this    Here is a summary of the information we have about your health screening for diabetic complications:   Eye exam:  Our records indicate that your last diabetic eye exam was 03/17/20,   Urine test for early diabetic kidney disease (microalbumin/creatinine ratio): Because you are followed by a kidney specialist, we are not monitoring your urine protein.  Monofilament test for neuropathy in feet:  We did a monofilament test on your feet 03/30/20, and the results were abnormal.  Dental care: You have told us that you last saw the dentist 03/2019.  Diabetic screening is up-to-date except for an eye exam and a dental exam.  It is important to have your eyes examined every year and to have regular dental care.     The following additional instructions relate to health conditions other than your diabetes:   Cholesterol/lipids: Your cholesterol is in good range but your triclyerides are elevated and your HDL ("good" cholesterol") is low. We have reviewed that decreased intake of "simple" sugars (including  fruit, fruit juice, other sweet drinks and sugary foods) can help to improve your triglycerides. Increased exercise can help raise your HDL.     Blood Pressure: Your blood pressure is well controlled on the current medication regimen and we will continue this.    Please check with your PCP's office to see if they have a nurse navigator who can help you find out how to apply to Medicare.     Return in about 3 months (around 12/14/2021).      Briscoe Burns, MD     Patient seen with Merryl Hacker, RN, CDCES

## 2021-09-13 NOTE — Patient Instructions (Addendum)
Below is a summary of information and instructions we reviewed at today's appointment. Please review this information carefully and call if you have any questions regarding it.    Directions for your next appointment:   Diabetic lab work is generally needed every 3 months.  Please see specific instructions below about when your lab work is due.  Please make every effort to have your lab work done prior to your appointment so we can review it at the appointment.  If you do not have the labs done before the visit, we may need to reschedule your appointment.  If you are using a glucose meter, please bring your meter to the appointment.  If you are using a continuous glucose monitor (Dexcom or libre) device, please bring this to your appointment so that your data can be reviewed at the appointment.      Diabetes information/instructions:   Labwork: You are due for lab work  now.  You should fast for 10 hours prior to having this labwork done.    Self-monitoring of blood glucose: We recommend that you monitor your blood glucose and as needed for signs and/or symptoms of hypoglycemia.  Important general information about using a continuous glucose sensor (CGM): Do not inject insulin within 1-3 inches of the sensor. Remove sensor if you are having MRI, CT, X-ray. Rotate sensor sites. If you stop wearing your CGM for a period of time, please resume fingerstick blood sugar checks at least 4 times a day.   Please contact [employer]  if you need  help with troubleshooting.  We will send in new prescription for [facility] 2 sensors.    Diabetic Medication/Insulin Instructions:  Continue current insulin regimen for now.        Low Blood Sugar Prevention and Treatment:   Please carry fast-acting carbohydrate source (preferably glucose tablets) with you at all times. Please treat low blood sugar with 15 grams carbohydrate (4 glucose tablets), wait 15 minutes, and recheck sugar.  Re-treat if necessary.   Please notify office for  any episode of severe hypoglycemia, such as those requiring assistance from someone else.     Aspirin: Please continue taking low-dose aspirin.    Diet:   Please continue your current diet.     Activity:   As tolerated    Foot Care:   Please examine feet daily and appropriately treat callouses or lesions. Please avoid being barefoot or wearing of ill fitting shoes. Please use lotion as needed to treat dry skin on feet.     Diabetic Education:   We did not discuss diabetic education today. If you would like to follow up with a diabetic educator, please call the office and we will arrange for this    Here is a summary of the information we have about your health screening for diabetic complications:   Eye exam:  Our records indicate that your last diabetic eye exam was 03/17/20,   Urine test for early diabetic kidney disease (microalbumin/creatinine ratio): Because you are followed by a kidney specialist, we are not monitoring your urine protein.  Monofilament test for neuropathy in feet:  We did a monofilament test on your feet 03/30/20, and the results were abnormal.  Dental care: You have told us that you last saw the dentist 03/2019.  Diabetic screening is up-to-date except for an eye exam and a dental exam.  It is important to have your eyes examined every year and to have regular dental care.  The following additional instructions relate to health conditions other than your diabetes:   Cholesterol/lipids: Your cholesterol is in good range but your triclyerides are elevated and your HDL ("good" cholesterol") is low. We have reviewed that decreased intake of "simple" sugars (including fruit, fruit juice, other sweet drinks and sugary foods) can help to improve your triglycerides. Increased exercise can help raise your HDL.     Blood Pressure: Your blood pressure is well controlled on the current medication regimen and we will continue this.    Please check with your PCP's office to see if they have a nurse  navigator who can help you find out how to apply to Medicare.

## 2021-09-16 ENCOUNTER — Ambulatory Visit
Admission: RE | Admit: 2021-09-16 | Discharge: 2021-09-16 | Disposition: A | Discharge status: Home / Self Care | Payer: PRIVATE HEALTH INSURANCE | Source: Ambulatory Visit

## 2021-09-16 ENCOUNTER — Ambulatory Visit
Admission: RE | Admit: 2021-09-16 | Discharge: 2021-09-16 | Disposition: A | Discharge status: Home / Self Care | Payer: PRIVATE HEALTH INSURANCE | Source: Ambulatory Visit | Attending: Internal Medicine | Admitting: Internal Medicine

## 2021-09-16 DIAGNOSIS — E782 Mixed hyperlipidemia: Secondary | ICD-10-CM | POA: Insufficient documentation

## 2021-09-16 DIAGNOSIS — N1831 Chronic kidney disease, stage 3a: Secondary | ICD-10-CM | POA: Insufficient documentation

## 2021-09-16 DIAGNOSIS — Z794 Long term (current) use of insulin: Secondary | ICD-10-CM | POA: Insufficient documentation

## 2021-09-16 DIAGNOSIS — E1122 Type 2 diabetes mellitus with diabetic chronic kidney disease: Secondary | ICD-10-CM | POA: Insufficient documentation

## 2021-09-16 DIAGNOSIS — N185 Chronic kidney disease, stage 5: Secondary | ICD-10-CM | POA: Insufficient documentation

## 2021-09-16 LAB — RENAL FUNCTION PANEL
Albumin: 3 gm/dL — ABNORMAL LOW (ref 3.5–5.0)
Anion Gap: 14.4 mMol/L (ref 7.0–18.0)
BUN / Creatinine Ratio: 13.2 Ratio (ref 10.0–30.0)
BUN: 56 mg/dL — ABNORMAL HIGH (ref 7–22)
CO2: 22.2 mMol/L (ref 20.0–30.0)
Calcium: 8.8 mg/dL (ref 8.5–10.5)
Chloride: 108 mMol/L (ref 98–110)
Creatinine: 4.25 mg/dL — ABNORMAL HIGH (ref 0.80–1.30)
EGFR: 15 mL/min/{1.73_m2} — ABNORMAL LOW (ref 60–150)
Glucose: 181 mg/dL — ABNORMAL HIGH (ref 71–99)
Osmolality Calculated: 299 mOsm/kg (ref 275–300)
Phosphorus: 4.4 mg/dL (ref 2.3–4.7)
Potassium: 4.6 mMol/L (ref 3.5–5.3)
Sodium: 140 mMol/L (ref 136–147)

## 2021-09-16 LAB — LIPID PANEL
Cholesterol: 132 mg/dL (ref 75–199)
Coronary Heart Disease Risk: 4.71
HDL: 28 mg/dL — ABNORMAL LOW (ref 40–55)
LDL Calculated: 39 mg/dL
Triglycerides: 324 mg/dL — ABNORMAL HIGH (ref 10–150)
VLDL: 65 — ABNORMAL HIGH (ref 0–40)

## 2021-09-16 LAB — URINE PROTEIN/CREATININE RATIO
Protein/Creatinine Ratio: 9.71 Ratio — ABNORMAL HIGH (ref 0.00–0.09)
Urine Creatinine Random: 58 mg/dL
Urine Protein: 563 mg/dL — ABNORMAL HIGH (ref 0–14)

## 2021-09-16 LAB — HEMOGLOBIN A1C
Estimated Average Glucose: 146 mg/dL
Hgb A1C, %: 6.7 %

## 2021-09-16 LAB — PTH, INTACT: PTH Intact: 89.9 pg/mL — ABNORMAL HIGH (ref 20.0–83.0)

## 2021-09-16 LAB — HEMOGLOBIN AND HEMATOCRIT
Hematocrit: 36.7 % — ABNORMAL LOW (ref 39.0–52.5)
Hemoglobin: 11.3 gm/dL — ABNORMAL LOW (ref 13.0–17.5)

## 2021-09-16 LAB — ALT: ALT: 22 U/L (ref 0–55)

## 2021-09-16 NOTE — Progress Notes (Signed)
Please call the patient with the following information:  A1c is very good.  Triglycerides have improved further.  Please continue current diabetic regimen and continue watching the diet, especially limiting intake of simple sugars such as fruits, juices, and sweet drinks.  This note was generated using speech recognition software and may contain unintended errors in grammar, spelling or content. Please ask me for clarification if questions about its content prior to calling patient.

## 2021-09-17 ENCOUNTER — Encounter (INDEPENDENT_AMBULATORY_CARE_PROVIDER_SITE_OTHER): Payer: PRIVATE HEALTH INSURANCE | Admitting: Surgery

## 2021-09-17 LAB — VITAMIN D, 25 OH, TOTAL: Vitamin D 25-Hydroxy: 23.3 ng/mL — ABNORMAL LOW (ref 30.0–80.0)

## 2021-09-24 ENCOUNTER — Encounter (INDEPENDENT_AMBULATORY_CARE_PROVIDER_SITE_OTHER): Payer: Self-pay | Admitting: Surgery

## 2021-09-24 ENCOUNTER — Ambulatory Visit (INDEPENDENT_AMBULATORY_CARE_PROVIDER_SITE_OTHER): Payer: PRIVATE HEALTH INSURANCE | Admitting: Surgery

## 2021-09-24 VITALS — BP 129/61 | HR 79 | Temp 98.1°F

## 2021-09-24 DIAGNOSIS — Z992 Dependence on renal dialysis: Secondary | ICD-10-CM

## 2021-09-24 DIAGNOSIS — N186 End stage renal disease: Secondary | ICD-10-CM

## 2021-09-24 NOTE — Progress Notes (Signed)
Chief Complaint   Patient presents with    Follow-up     CKD-5     09/24/2021    Rhunette Croft, MD        [name] returns today for follow up of   Encounter Diagnosis   Name Primary?    Encounter regarding vascular access for dialysis for end-stage renal disease Yes    . He had Left Formation, A-v Fistula, Upper Extremity - Left on 09/06/2021 He has no complaints.       The patients problem list, medication list, diagnoses, and allergies were all reviewed and updated as needed.      PE:    BP 129/61   Pulse 79   Temp 98.1 F (36.7 C)   SpO2 96%   Incision c/d/I  No signs of infection  No signs of ischemia    Nice thrill and bruit      Surgical Pathology  No results found for: "SURGICALPATH"       A:  Encounter Diagnosis   Name Primary?    Encounter regarding vascular access for dialysis for end-stage renal disease Yes     Unremarkable postoperative course       P:    Follow up 4 weeks

## 2021-10-04 ENCOUNTER — Other Ambulatory Visit (RURAL_HEALTH_CENTER): Payer: Self-pay

## 2021-10-04 NOTE — Progress Notes (Signed)
LVMM for patient.  Nelia Shi, RN, BSN  RN Care Coordinator  [place]

## 2021-10-21 ENCOUNTER — Ambulatory Visit
Admission: RE | Admit: 2021-10-21 | Discharge: 2021-10-21 | Disposition: A | Discharge status: Home / Self Care | Payer: PRIVATE HEALTH INSURANCE | Source: Ambulatory Visit | Attending: Internal Medicine | Admitting: Internal Medicine

## 2021-10-21 ENCOUNTER — Telehealth (INDEPENDENT_AMBULATORY_CARE_PROVIDER_SITE_OTHER): Payer: Self-pay

## 2021-10-21 DIAGNOSIS — N1831 Chronic kidney disease, stage 3a: Secondary | ICD-10-CM | POA: Insufficient documentation

## 2021-10-21 LAB — RENAL FUNCTION PANEL
Albumin: 2.7 gm/dL — ABNORMAL LOW (ref 3.5–5.0)
Anion Gap: 14.6 mMol/L (ref 7.0–18.0)
BUN / Creatinine Ratio: 15.1 Ratio (ref 10.0–30.0)
BUN: 59 mg/dL — ABNORMAL HIGH (ref 7–22)
CO2: 22.2 mMol/L (ref 20.0–30.0)
Calcium: 9.2 mg/dL (ref 8.5–10.5)
Chloride: 110 mMol/L (ref 98–110)
Creatinine: 3.92 mg/dL — ABNORMAL HIGH (ref 0.80–1.30)
EGFR: 17 mL/min/{1.73_m2} — ABNORMAL LOW (ref 60–150)
Glucose: 142 mg/dL — ABNORMAL HIGH (ref 71–99)
Osmolality Calculated: 302 mOsm/kg — ABNORMAL HIGH (ref 275–300)
Phosphorus: 4.7 mg/dL (ref 2.3–4.7)
Potassium: 4.8 mMol/L (ref 3.5–5.3)
Sodium: 142 mMol/L (ref 136–147)

## 2021-10-21 LAB — PROTEIN / CREATININE RATIO, URINE
Protein/Creatinine Ratio: 9.94 Ratio — ABNORMAL HIGH (ref 0.00–0.09)
Urine Creatinine Random: 48 mg/dL
Urine Protein: 477 mg/dL — ABNORMAL HIGH (ref 0–14)

## 2021-10-21 LAB — HEMOGLOBIN AND HEMATOCRIT, BLOOD
Hematocrit: 36.5 % — ABNORMAL LOW (ref 39.0–52.5)
Hemoglobin: 11.4 gm/dL — ABNORMAL LOW (ref 13.0–17.5)

## 2021-10-21 LAB — PTH, INTACT: PTH Intact: 64.6 pg/mL (ref 20.0–83.0)

## 2021-10-21 IMAGING — MR MRI PROSTATE WO/W CONTRAST
9 series · 48 of 48 positions shown · IV contrast (20CC PROHANCE)
Comparison: None.

Images Obtained from [facility]
REASON FOR EXAM: Elevated prostate specific antigen [PSA]
TECHNIQUE: Multiplanar, multisequence 3T MR images of the pelvis were acquired.  Axial diffusion weighted EPI images at b values of 0, 50, and 800 s/mm2 were acquired to generate an ADC map.  Data
from the ADC map produced an additional set of DWI images, at a calculated b value of 1866 s/mm2.  Dynamic contrast enhanced sequences were then acquired using a rapid TWIST sequence with an
injection rate of 1.5 cc/s.
IV Contrast: 20 cc ProHance
PI-RADS defines clinically significant prostate cancer as Gleason score >=7 (including 3+4), and/or volume >0.5 cc, and/or extraprostatic extension.
PI-RADS Assessment Categories
PI-RADS 1 - Very low (clinically significant cancer is highly unlikely to be present)
PI-RADS 2 - Low (clinically significant cancer is unlikely to be present)
PI-RADS 3 - Intermediate (the presence of clinically significant cancer is equivocal)
PI-RADS 4 - High (clinically significant cancer is likely to be present)
PI-RADS 5 - Very high (clinically significant cancer is highly likely to be present)
PSA: 4.2 ng/mL
Gland volume: 122 mL
PSA Density: 0.034 ng/mL/mL; normal less than 0.15 ng/ml/mL

[Series 2: haste_3 plane loc · axial · 6.0mm · 1.17mm/px · 1 of 15 slices shown]
[im 1/15]
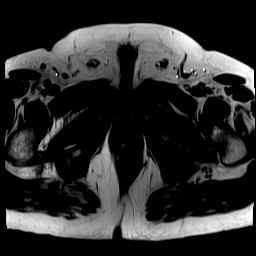

[Series 3: t1_axial_tse · axial · 6.0mm · 1.04mm/px · 1 of 34 slices shown]
[im 1/34]
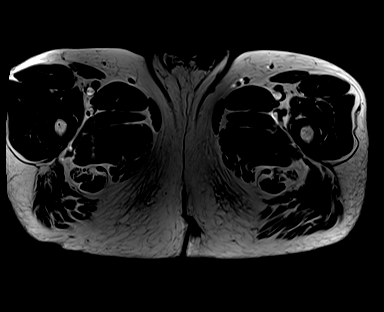

[Series 4: t2_tse_blade_sag · sagittal · 3.0mm · 0.62mm/px · 1 of 28 slices shown]
[im 1/28]
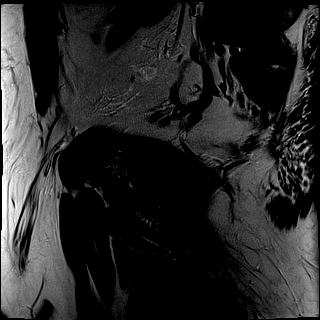

[Series 5: t2_(person_name)_(person_name) · axial · 3.0mm · 0.56mm/px · 1 of 30 slices shown]
[im 1/30]
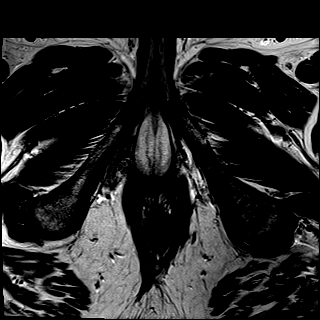

[Series 6: t2_tse_cor · coronal · 3.0mm · 0.62mm/px · 1 of 24 slices shown]
[im 1/24]
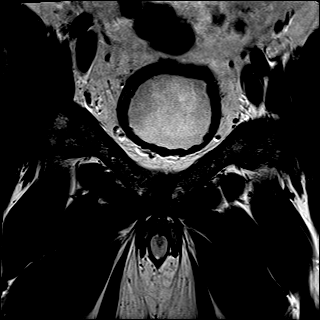

[Series 7: resolve_diff_b50_800_(person_name)_(person_name)2_tracew · axial · 3.5mm · 1.57mm/px · 1 of 22 slices shown]
[im 1/22]
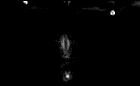

[Series 8: resolve_diff_b50_800_(person_name)_(person_name)2_adc · axial · 3.5mm · 1.57mm/px · 1 of 22 slices shown]
[im 1/22]
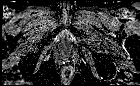

[Series 9: resolve_diff_b50_800_(person_name)_(person_name)2_calc_bval · axial · 3.5mm · 1.57mm/px · 1 of 22 slices shown]
[im 1/22]
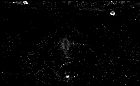

[Series 10: t1_twist_(person_name)_dyn · axial · 3.5mm · 1.15mm/px · z∈[+0,+74]mm · 40 of 1056 slices shown]
[im 1/1056]
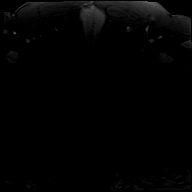
[im 28/1056]
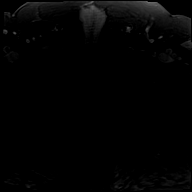
[im 55/1056]
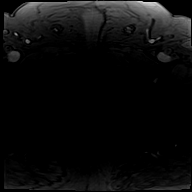
[im 82/1056]
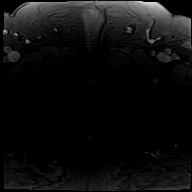
[im 109/1056]
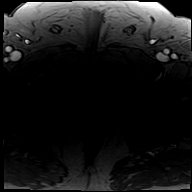
[im 136/1056]
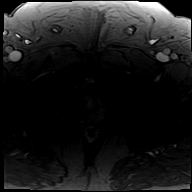
[im 163/1056]
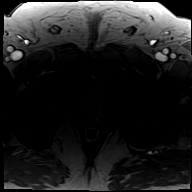
[im 190/1056]
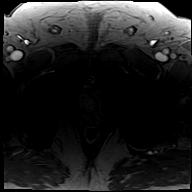
[im 217/1056]
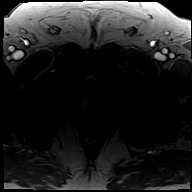
[im 244/1056]
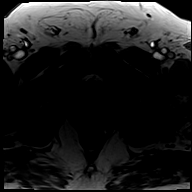
[im 271/1056]
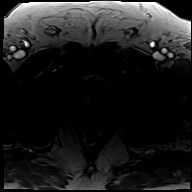
[im 298/1056]
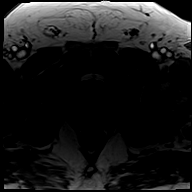
[im 325/1056]
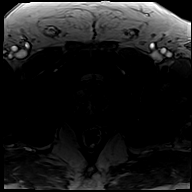
[im 352/1056]
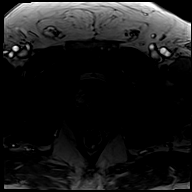
[im 379/1056]
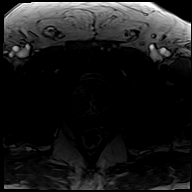
[im 406/1056]
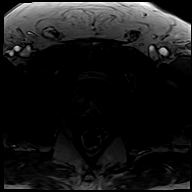
[im 433/1056]
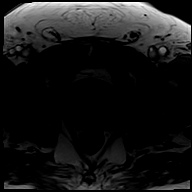
[im 460/1056]
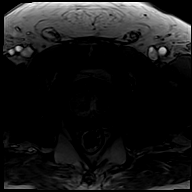
[im 487/1056]
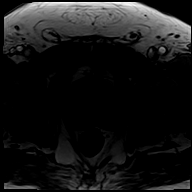
[im 514/1056]
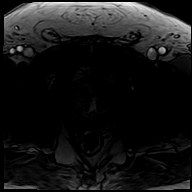
[im 542/1056]
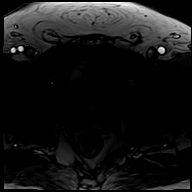
[im 569/1056]
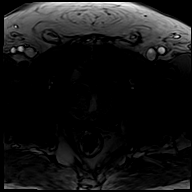
[im 596/1056]
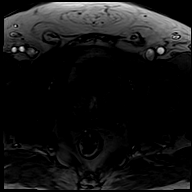
[im 623/1056]
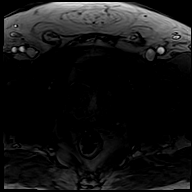
[im 650/1056]
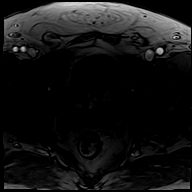
[im 677/1056]
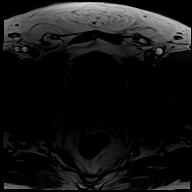
[im 704/1056]
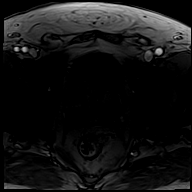
[im 731/1056]
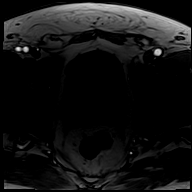
[im 758/1056]
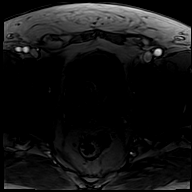
[im 785/1056]
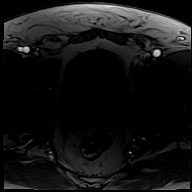
[im 812/1056]
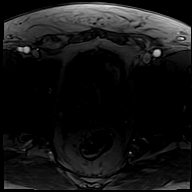
[im 839/1056]
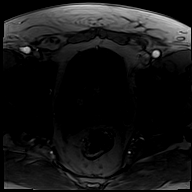
[im 866/1056]
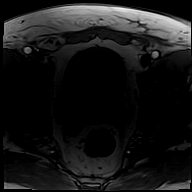
[im 893/1056]
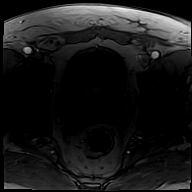
[im 920/1056]
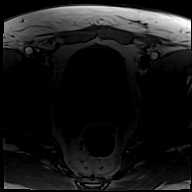
[im 947/1056]
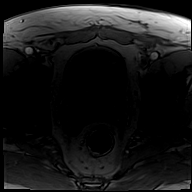
[im 974/1056]
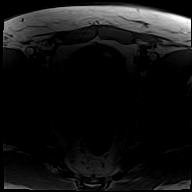
[im 1001/1056]
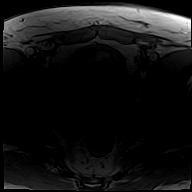
[im 1028/1056]
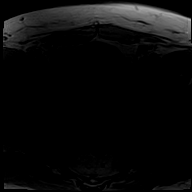
[im 1056/1056]
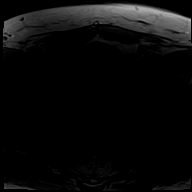

[48 of 48 positions shown; findings below may reference images not displayed]

FINDINGS: IMAGE QUALITY: Good.
POST-INTERVENTIONAL SIGNAL: No T1 gland abnormality is present.
PERIPHERAL ZONE: The peripheral zone is normal on diffusion, T2 and perfusion studies with no evidence of high-grade peripheral zone prostate malignancy.
TRANSITIONAL ZONE: There are numerous benign-appearing BPH nodules in the transitional zone with no T2, diffusion or perfusion findings of high-grade prostate malignancy.
NEUROVASCULAR BUNDLES: Normal.
SEMINAL VESICLES: Normal.
LYMPH NODES: No enlarged or morphologically suspicious lymph nodes.
BONES: No suspicious marrow signal abnormality or concerning enhancement.
PELVIC VISCERA: Diverticulosis of sigmoid colon is present. The bladder is normal and no significant inguinal findings are present.
IMPRESSION: MRI prostate is negative for evidence of high-grade prostate malignancy.  (PI-RADS 1)

## 2021-10-21 NOTE — Telephone Encounter (Signed)
LMTCB  Call was to see if patient could move appointment with Dr. Linzie Collin from 09/13 to 09/18 still at 10:15.

## 2021-10-22 LAB — VITAMIN D, 25 OH, TOTAL: Vitamin D 25-Hydroxy: 27.9 ng/mL — ABNORMAL LOW (ref 30.0–80.0)

## 2021-10-23 ENCOUNTER — Encounter (INDEPENDENT_AMBULATORY_CARE_PROVIDER_SITE_OTHER): Payer: Self-pay

## 2021-10-27 ENCOUNTER — Encounter (INDEPENDENT_AMBULATORY_CARE_PROVIDER_SITE_OTHER): Payer: PRIVATE HEALTH INSURANCE | Admitting: Surgery

## 2021-10-28 ENCOUNTER — Other Ambulatory Visit (RURAL_HEALTH_CENTER): Payer: Self-pay | Admitting: Family Medicine

## 2021-11-01 ENCOUNTER — Encounter (INDEPENDENT_AMBULATORY_CARE_PROVIDER_SITE_OTHER): Payer: PRIVATE HEALTH INSURANCE | Admitting: Surgery

## 2021-11-02 ENCOUNTER — Encounter (INDEPENDENT_AMBULATORY_CARE_PROVIDER_SITE_OTHER): Payer: Self-pay | Admitting: Surgery

## 2021-11-02 ENCOUNTER — Ambulatory Visit (INDEPENDENT_AMBULATORY_CARE_PROVIDER_SITE_OTHER): Payer: PRIVATE HEALTH INSURANCE | Admitting: Surgery

## 2021-11-02 VITALS — BP 131/65 | HR 78 | Temp 98.1°F | Resp 18 | Ht 66.0 in | Wt 270.0 lb

## 2021-11-02 DIAGNOSIS — N186 End stage renal disease: Secondary | ICD-10-CM

## 2021-11-02 DIAGNOSIS — Z992 Dependence on renal dialysis: Secondary | ICD-10-CM

## 2021-11-02 NOTE — Progress Notes (Addendum)
Chief Complaint   Patient presents with    Post-op     1 month AVF Left      11/02/2021    Rhunette Croft, MD        Janie Morning returns today for follow up of   Encounter Diagnosis   Name Primary?    Encounter regarding vascular access for dialysis for end-stage renal disease Yes    . He had Left Formation, A-v Fistula, Upper Extremity - Left on 09/06/2021 He has no complaints.       The patients problem list, medication list, diagnoses, and allergies were all reviewed and updated as needed.      PE:    BP 131/65   Pulse 78   Temp 98.1 F (36.7 C) (Temporal)   Resp 18   Ht 1.676 m (5\' 6" )   Wt 122.5 kg (270 lb)   BMI 43.58 kg/m   Incision c/d/I  No signs of infection  No signs of ischemia    Nice thrill and bruit    Ultrasound surveillance shows the fistula to be > 6 mm in size and < 6 mm from the skin surface. There are no significant side branches.  The fistula measures 6.3 mm to 1.4 mm.    Surgical Pathology  No results found for: "SURGICALPATH"       A:  Encounter Diagnosis   Name Primary?    Encounter regarding vascular access for dialysis for end-stage renal disease Yes     Unremarkable postoperative course     Mature fistula    P:    Follow up 3 months     He is interested in PD     His last GFR was 17

## 2021-11-04 ENCOUNTER — Ambulatory Visit (RURAL_HEALTH_CENTER): Payer: Self-pay | Admitting: Family Medicine

## 2021-11-18 ENCOUNTER — Telehealth (RURAL_HEALTH_CENTER): Payer: Self-pay | Admitting: Internal Medicine

## 2021-11-18 ENCOUNTER — Other Ambulatory Visit: Payer: Self-pay

## 2021-11-18 NOTE — Telephone Encounter (Signed)
Patient's wife called [facility] and asked if Dr. Charlotta Newton can fit her husband in sooner for a new patient appt. If she can call her at [phone] when you get the time. She stated Dr. Charlotta Newton help them at the clinic. Please advise

## 2021-11-18 NOTE — Telephone Encounter (Signed)
The earliest I could see him is 9:00 on October 11

## 2021-11-18 NOTE — Progress Notes (Signed)
[facility]    OUTREACH:     Communication preferences:  English    Outreach communication type: left voicemail on MOBILE phone to return call    Interpreter utilized: No interpreter needed, fluent in [place]    Reason for outreach: Care Coordination, Introduce to program and offer enrollment, and referral from S.N.W, Care Coordinator in regards to transportation concerns.     Laurena Bering, [employer]    [phone] Direct Line  email [email or web address]

## 2021-11-18 NOTE — Telephone Encounter (Signed)
Called the wife back and she stated she got him in with his Doctor so he will no longer need this appt

## 2021-11-21 ENCOUNTER — Encounter (INDEPENDENT_AMBULATORY_CARE_PROVIDER_SITE_OTHER): Payer: Self-pay

## 2021-11-21 ENCOUNTER — Inpatient Hospital Stay
Admission: EM | Admit: 2021-11-21 | Discharge: 2021-11-24 | DRG: 682 | Disposition: A | Discharge status: Home / Self Care | Payer: PRIVATE HEALTH INSURANCE | Attending: Internal Medicine | Admitting: Internal Medicine

## 2021-11-21 ENCOUNTER — Emergency Department: Payer: PRIVATE HEALTH INSURANCE

## 2021-11-21 DIAGNOSIS — E1165 Type 2 diabetes mellitus with hyperglycemia: Secondary | ICD-10-CM

## 2021-11-21 DIAGNOSIS — E785 Hyperlipidemia, unspecified: Secondary | ICD-10-CM

## 2021-11-21 DIAGNOSIS — N185 Chronic kidney disease, stage 5: Secondary | ICD-10-CM

## 2021-11-21 DIAGNOSIS — K219 Gastro-esophageal reflux disease without esophagitis: Secondary | ICD-10-CM

## 2021-11-21 DIAGNOSIS — N179 Acute kidney failure, unspecified: Principal | ICD-10-CM | POA: Diagnosis present

## 2021-11-21 DIAGNOSIS — R0602 Shortness of breath: Secondary | ICD-10-CM

## 2021-11-21 DIAGNOSIS — Z6841 Body Mass Index (BMI) 40.0 and over, adult: Secondary | ICD-10-CM

## 2021-11-21 DIAGNOSIS — Z87891 Personal history of nicotine dependence: Secondary | ICD-10-CM

## 2021-11-21 DIAGNOSIS — E669 Obesity, unspecified: Secondary | ICD-10-CM | POA: Diagnosis present

## 2021-11-21 DIAGNOSIS — Z818 Family history of other mental and behavioral disorders: Secondary | ICD-10-CM

## 2021-11-21 DIAGNOSIS — Z801 Family history of malignant neoplasm of trachea, bronchus and lung: Secondary | ICD-10-CM

## 2021-11-21 DIAGNOSIS — I509 Heart failure, unspecified: Secondary | ICD-10-CM

## 2021-11-21 DIAGNOSIS — L97509 Non-pressure chronic ulcer of other part of unspecified foot with unspecified severity: Secondary | ICD-10-CM | POA: Diagnosis present

## 2021-11-21 DIAGNOSIS — Z992 Dependence on renal dialysis: Secondary | ICD-10-CM

## 2021-11-21 DIAGNOSIS — Z833 Family history of diabetes mellitus: Secondary | ICD-10-CM

## 2021-11-21 DIAGNOSIS — E1142 Type 2 diabetes mellitus with diabetic polyneuropathy: Secondary | ICD-10-CM | POA: Diagnosis present

## 2021-11-21 DIAGNOSIS — M7989 Other specified soft tissue disorders: Secondary | ICD-10-CM

## 2021-11-21 DIAGNOSIS — Z7982 Long term (current) use of aspirin: Secondary | ICD-10-CM

## 2021-11-21 DIAGNOSIS — I132 Hypertensive heart and chronic kidney disease with heart failure and with stage 5 chronic kidney disease, or end stage renal disease: Principal | ICD-10-CM

## 2021-11-21 DIAGNOSIS — I10 Essential (primary) hypertension: Secondary | ICD-10-CM

## 2021-11-21 DIAGNOSIS — N2581 Secondary hyperparathyroidism of renal origin: Secondary | ICD-10-CM | POA: Diagnosis present

## 2021-11-21 DIAGNOSIS — Z79899 Other long term (current) drug therapy: Secondary | ICD-10-CM

## 2021-11-21 DIAGNOSIS — E1122 Type 2 diabetes mellitus with diabetic chronic kidney disease: Secondary | ICD-10-CM

## 2021-11-21 DIAGNOSIS — M1990 Unspecified osteoarthritis, unspecified site: Secondary | ICD-10-CM | POA: Diagnosis present

## 2021-11-21 DIAGNOSIS — Z825 Family history of asthma and other chronic lower respiratory diseases: Secondary | ICD-10-CM

## 2021-11-21 DIAGNOSIS — E875 Hyperkalemia: Secondary | ICD-10-CM | POA: Diagnosis present

## 2021-11-21 DIAGNOSIS — G4733 Obstructive sleep apnea (adult) (pediatric): Secondary | ICD-10-CM | POA: Diagnosis present

## 2021-11-21 DIAGNOSIS — Z794 Long term (current) use of insulin: Secondary | ICD-10-CM

## 2021-11-21 DIAGNOSIS — Z8249 Family history of ischemic heart disease and other diseases of the circulatory system: Secondary | ICD-10-CM

## 2021-11-21 DIAGNOSIS — Z887 Allergy status to serum and vaccine status: Secondary | ICD-10-CM

## 2021-11-21 DIAGNOSIS — I120 Hypertensive chronic kidney disease with stage 5 chronic kidney disease or end stage renal disease: Secondary | ICD-10-CM | POA: Diagnosis present

## 2021-11-21 DIAGNOSIS — J810 Acute pulmonary edema: Secondary | ICD-10-CM | POA: Diagnosis present

## 2021-11-21 DIAGNOSIS — E11621 Type 2 diabetes mellitus with foot ulcer: Secondary | ICD-10-CM | POA: Diagnosis present

## 2021-11-21 DIAGNOSIS — R601 Generalized edema: Secondary | ICD-10-CM

## 2021-11-21 DIAGNOSIS — J811 Chronic pulmonary edema: Secondary | ICD-10-CM | POA: Diagnosis present

## 2021-11-21 DIAGNOSIS — D631 Anemia in chronic kidney disease: Secondary | ICD-10-CM | POA: Diagnosis present

## 2021-11-21 LAB — VH URINALYSIS WITH MICROSCOPIC AND CULTURE IF INDICATED
Bilirubin, UA: NEGATIVE
Blood, UA: NEGATIVE
Glucose, UA: 50 mg/dL — AB
Ketones UA: NEGATIVE mg/dL
Leukocyte Esterase, UA: NEGATIVE Leu/uL
Nitrite, UA: NEGATIVE
Protein, UR: >=500 mg/dL — AB
RBC, UA: 1 /hpf (ref 0–4)
Urine Specific Gravity: 1.008 (ref 1.001–1.040)
Urobilinogen, UA: NORMAL mg/dL
WBC, UA: 1 /hpf (ref 0–4)
pH, Urine: 6 pH (ref 5.0–8.0)

## 2021-11-21 LAB — HEMOGLOBIN A1C
Estimated Average Glucose: 137 mg/dL
Hgb A1C, %: 6.4 %

## 2021-11-21 LAB — COMPREHENSIVE METABOLIC PANEL
ALT: 18 U/L (ref 0–55)
AST (SGOT): 16 U/L (ref 10–42)
Albumin/Globulin Ratio: 0.81 Ratio (ref 0.80–2.00)
Albumin: 3 gm/dL — ABNORMAL LOW (ref 3.5–5.0)
Alkaline Phosphatase: 161 U/L — ABNORMAL HIGH (ref 40–145)
Anion Gap: 15.9 mMol/L (ref 7.0–18.0)
BUN / Creatinine Ratio: 13 Ratio (ref 10.0–30.0)
BUN: 59 mg/dL — ABNORMAL HIGH (ref 7–22)
Bilirubin, Total: 0.3 mg/dL (ref 0.1–1.2)
CO2: 21.4 mMol/L (ref 20.0–30.0)
Calcium: 9.2 mg/dL (ref 8.5–10.5)
Chloride: 109 mMol/L (ref 98–110)
Creatinine: 4.54 mg/dL — ABNORMAL HIGH (ref 0.80–1.30)
EGFR: 14 mL/min/{1.73_m2} — ABNORMAL LOW (ref 60–150)
Globulin: 3.7 gm/dL (ref 2.0–4.0)
Glucose: 129 mg/dL — ABNORMAL HIGH (ref 71–99)
Osmolality Calculated: 301 mOsm/kg — ABNORMAL HIGH (ref 275–300)
Potassium: 4.3 mMol/L (ref 3.5–5.3)
Protein, Total: 6.7 gm/dL (ref 6.0–8.3)
Sodium: 142 mMol/L (ref 136–147)

## 2021-11-21 LAB — CBC AND DIFFERENTIAL
Basophils %: 0.1 % (ref 0.0–3.0)
Basophils Absolute: 0 10*3/uL (ref 0.0–0.3)
Eosinophils %: 0.8 % (ref 0.0–7.0)
Eosinophils Absolute: 0.1 10*3/uL (ref 0.0–0.8)
Hematocrit: 32.7 % — ABNORMAL LOW (ref 39.0–52.5)
Hemoglobin: 10.2 gm/dL — ABNORMAL LOW (ref 13.0–17.5)
Lymphocytes Absolute: 1.5 10*3/uL (ref 0.6–5.1)
Lymphocytes: 14.8 % — ABNORMAL LOW (ref 15.0–46.0)
MCH: 29 pg (ref 28–35)
MCHC: 31 gm/dL — ABNORMAL LOW (ref 32–36)
MCV: 92 fL (ref 80–100)
MPV: 9.3 fL (ref 6.0–10.0)
Monocytes Absolute: 0.7 10*3/uL (ref 0.1–1.7)
Monocytes: 7.4 % (ref 3.0–15.0)
Neutrophils %: 76.8 % (ref 42.0–78.0)
Neutrophils Absolute: 7.7 10*3/uL (ref 1.7–8.6)
PLT CT: 242 10*3/uL (ref 130–440)
RBC: 3.55 10*6/uL — ABNORMAL LOW (ref 4.00–5.70)
RDW: 13.7 % (ref 11.0–14.0)
WBC: 10 10*3/uL (ref 4.0–11.0)

## 2021-11-21 LAB — VH DEXTROSE STICK GLUCOSE
Glucose POCT: 108 mg/dL — ABNORMAL HIGH (ref 71–99)
Glucose POCT: 167 mg/dL — ABNORMAL HIGH (ref 71–99)
Glucose POCT: 155 mg/dL — ABNORMAL HIGH (ref 71–99)

## 2021-11-21 LAB — TROPONIN I: Troponin I: 0.02 ng/mL (ref 0.00–0.02)

## 2021-11-21 LAB — ECG 12-LEAD: P-R Interval: 44 ms

## 2021-11-21 LAB — B-TYPE NATRIURETIC PEPTIDE: B-Natriuretic Peptide: 820.7 pg/mL — ABNORMAL HIGH (ref 0.0–100.0)

## 2021-11-21 LAB — VH EXTRA SPECIMEN LABEL

## 2021-11-21 MED ORDER — LANTUS SOLOSTAR 100 UNIT/ML SC SOPN
20.0000 [IU] | PEN_INJECTOR | Freq: Two times a day (BID) | SUBCUTANEOUS | Status: DC
Start: 2021-11-21 — End: 2021-11-22
  Administered 2021-11-21 – 2021-11-22 (×2): 20 [IU] via SUBCUTANEOUS
  Filled 2021-11-21: qty 3

## 2021-11-21 MED ORDER — VH DEXTROSE 10 % IV BOLUS (ADULT)
125.0000 mL | INTRAVENOUS | Status: DC | PRN
Start: 2021-11-21 — End: 2021-11-24

## 2021-11-21 MED ORDER — VH HEPARIN SODIUM (PORCINE) 5000 UNIT/ML IJ SOLN
5000.0000 [IU] | Freq: Two times a day (BID) | INTRAMUSCULAR | Status: DC
Start: 2021-11-21 — End: 2021-11-24
  Administered 2021-11-21 – 2021-11-24 (×6): 5000 [IU] via SUBCUTANEOUS
  Filled 2021-11-21 (×6): qty 1

## 2021-11-21 MED ORDER — PCA SYRINGE/STERILE/EMPTY MISC
5.0000 mg/h | INTRAMUSCULAR | Status: DC
Start: 2021-11-21 — End: 2021-11-24
  Administered 2021-11-21: 10 mg/h via INTRAVENOUS
  Administered 2021-11-22 – 2021-11-23 (×2): 20 mg/h via INTRAVENOUS
  Filled 2021-11-21 (×4): qty 50

## 2021-11-21 MED ORDER — HYDROCODONE-ACETAMINOPHEN 5-325 MG PO TABS
1.0000 | ORAL_TABLET | ORAL | Status: DC | PRN
Start: 2021-11-21 — End: 2021-11-24

## 2021-11-21 MED ORDER — VH POTASSIUM CHLORIDE CRYS ER 10 MEQ PO TBCR (WRAP)
10.0000 meq | EXTENDED_RELEASE_TABLET | Freq: Every day | ORAL | Status: DC
Start: 2021-11-21 — End: 2021-11-24
  Administered 2021-11-21 – 2021-11-24 (×4): 10 meq via ORAL
  Filled 2021-11-21 (×4): qty 1

## 2021-11-21 MED ORDER — MELATONIN 3 MG PO TABS
3.0000 mg | ORAL_TABLET | Freq: Every evening | ORAL | Status: DC | PRN
Start: 2021-11-21 — End: 2021-11-24
  Administered 2021-11-21 – 2021-11-22 (×2): 3 mg via ORAL
  Filled 2021-11-21 (×2): qty 1

## 2021-11-21 MED ORDER — FUROSEMIDE 10 MG/ML IJ SOLN
80.0000 mg | Freq: Once | INTRAMUSCULAR | Status: AC
Start: 2021-11-21 — End: 2021-11-21
  Administered 2021-11-21: 80 mg via INTRAVENOUS

## 2021-11-21 MED ORDER — SODIUM CHLORIDE (PF) 0.9 % IJ SOLN
0.4000 mg | INTRAMUSCULAR | Status: DC | PRN
Start: 2021-11-21 — End: 2021-11-24

## 2021-11-21 MED ORDER — ASPIRIN 81 MG PO TBEC
81.0000 mg | DELAYED_RELEASE_TABLET | Freq: Every morning | ORAL | Status: DC
Start: 2021-11-22 — End: 2021-11-24
  Administered 2021-11-22 – 2021-11-24 (×3): 81 mg via ORAL
  Filled 2021-11-21 (×3): qty 1

## 2021-11-21 MED ORDER — INSULIN LISPRO (1 UNIT DIAL) 100 UNIT/ML SC SOPN
2.0000 [IU] | PEN_INJECTOR | Freq: Three times a day (TID) | SUBCUTANEOUS | Status: DC
Start: 2021-11-21 — End: 2021-11-24
  Administered 2021-11-21 – 2021-11-24 (×2): 2 [IU] via SUBCUTANEOUS

## 2021-11-21 MED ORDER — ACETAMINOPHEN 325 MG PO TABS
650.0000 mg | ORAL_TABLET | ORAL | Status: DC | PRN
Start: 2021-11-21 — End: 2021-11-24

## 2021-11-21 MED ORDER — SODIUM BICARBONATE 650 MG PO TABS
650.0000 mg | ORAL_TABLET | Freq: Two times a day (BID) | ORAL | Status: DC
Start: 2021-11-21 — End: 2021-11-23
  Administered 2021-11-21 – 2021-11-23 (×5): 650 mg via ORAL
  Filled 2021-11-21 (×5): qty 1

## 2021-11-21 MED ORDER — FUROSEMIDE 10 MG/ML IJ SOLN
INTRAMUSCULAR | Status: AC
Start: 2021-11-21 — End: ?
  Filled 2021-11-21: qty 8

## 2021-11-21 MED ORDER — OXYMETAZOLINE HCL 0.05 % NA SOLN
2.0000 | Freq: Two times a day (BID) | NASAL | Status: DC | PRN
Start: 2021-11-21 — End: 2021-11-24
  Administered 2021-11-21: 2 via NASAL
  Filled 2021-11-21: qty 15

## 2021-11-21 MED ORDER — INSULIN LISPRO (1 UNIT DIAL) 100 UNIT/ML SC SOPN
5.0000 [IU] | PEN_INJECTOR | Freq: Three times a day (TID) | SUBCUTANEOUS | Status: DC
Start: 2021-11-21 — End: 2021-11-23
  Administered 2021-11-21 – 2021-11-23 (×7): 5 [IU] via SUBCUTANEOUS
  Filled 2021-11-21 (×2): qty 3

## 2021-11-21 MED ORDER — INSULIN LISPRO (1 UNIT DIAL) 100 UNIT/ML SC SOPN
1.0000 [IU] | PEN_INJECTOR | Freq: Every evening | SUBCUTANEOUS | Status: DC
Start: 2021-11-21 — End: 2021-11-24
  Administered 2021-11-21 – 2021-11-23 (×2): 1 [IU] via SUBCUTANEOUS

## 2021-11-21 MED ORDER — ONDANSETRON 4 MG PO TBDP
4.0000 mg | ORAL_TABLET | Freq: Three times a day (TID) | ORAL | Status: DC | PRN
Start: 2021-11-21 — End: 2021-11-24

## 2021-11-21 MED ORDER — METOPROLOL TARTRATE 50 MG PO TABS
50.0000 mg | ORAL_TABLET | Freq: Two times a day (BID) | ORAL | Status: DC
Start: 2021-11-21 — End: 2021-11-24
  Administered 2021-11-21 – 2021-11-24 (×6): 50 mg via ORAL
  Filled 2021-11-21 (×6): qty 1

## 2021-11-21 MED ORDER — ONDANSETRON HCL 4 MG/2ML IJ SOLN
4.0000 mg | Freq: Three times a day (TID) | INTRAMUSCULAR | Status: DC | PRN
Start: 2021-11-21 — End: 2021-11-24

## 2021-11-21 MED ORDER — ACETAMINOPHEN 650 MG RE SUPP
650.0000 mg | RECTAL | Status: DC | PRN
Start: 2021-11-21 — End: 2021-11-24

## 2021-11-21 MED ORDER — LANTUS SOLOSTAR 100 UNIT/ML SC SOPN
20.0000 [IU] | PEN_INJECTOR | Freq: Two times a day (BID) | SUBCUTANEOUS | Status: DC
Start: 2021-11-21 — End: 2021-11-21

## 2021-11-21 MED ORDER — SODIUM CHLORIDE (PF) 0.9 % IJ SOLN
3.0000 mL | Freq: Two times a day (BID) | INTRAMUSCULAR | Status: DC
Start: 2021-11-21 — End: 2021-11-24
  Administered 2021-11-21 – 2021-11-24 (×5): 3 mL via INTRAVENOUS

## 2021-11-21 MED ORDER — GABAPENTIN 300 MG PO CAPS
300.0000 mg | ORAL_CAPSULE | Freq: Once | ORAL | Status: AC
Start: 2021-11-21 — End: 2021-11-21
  Administered 2021-11-21: 300 mg via ORAL
  Filled 2021-11-21: qty 1

## 2021-11-21 MED ORDER — AMLODIPINE BESYLATE 5 MG PO TABS
10.0000 mg | ORAL_TABLET | Freq: Every day | ORAL | Status: DC
Start: 2021-11-22 — End: 2021-11-22
  Administered 2021-11-22: 10 mg via ORAL
  Filled 2021-11-21: qty 2

## 2021-11-21 MED ORDER — GLUCAGON 1 MG IJ SOLR (WRAP)
1.0000 mg | INTRAMUSCULAR | Status: DC | PRN
Start: 2021-11-21 — End: 2021-11-24

## 2021-11-21 MED ORDER — ACETAMINOPHEN 160 MG/5ML PO SOLN
650.0000 mg | ORAL | Status: DC | PRN
Start: 2021-11-21 — End: 2021-11-24

## 2021-11-21 MED ORDER — ATORVASTATIN CALCIUM 40 MG PO TABS
40.0000 mg | ORAL_TABLET | Freq: Every evening | ORAL | Status: DC
Start: 2021-11-21 — End: 2021-11-24
  Administered 2021-11-21 – 2021-11-23 (×3): 40 mg via ORAL
  Filled 2021-11-21 (×3): qty 1

## 2021-11-21 NOTE — Consults (Signed)
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   Baseline sCr ~ 4  Follows with Dr. Fernande Boyden for CKD management  Has completed ESKD education and voices preference for CCPD or pre-emptive transplant  Has been referred to [facility] transplant  backup AVF placed 08/2021  Hypervolemia with bilateral pleural effusions secondary to advancing CKD and nephrotic proteinuria  Nephrotic proteinuria  UPCR 7-13  Small Bilateral pleural effusions secondary to nephrotic proteinuria and advanced CKD  Essential HTN exacerbated by hypervolemia  Type II DM  Secondary HPTH  Anemia of CKD, ESA naive to date      Plan:     Agree with starting lasix gtt to affect diuresis  Strict I/Os, daily weights, daily chemistries  Low sodium and fluid restricted diet as ordered    Will follow daily to assess for diuretic response and depending upon trends in sCr/azotemia we shall determine if he would need to start dialysis.    No acute dialysis needs at this time  Will follow closely with you      We will follow the patient with you during this hospitalization.  Shibeshi, Woldecherkos A*, thank you for the opportunity to assist in the care of this patient.    Please contact me with any questions or issues.  Office [phone], Cell [phone], Pager [phone]          History:     56 year old Caucasian male with stage G5/A3 CKD attributed to presumptive diabetic and hypertensive kidney disease and compounded by severe nephrotic proteinuria (UPCR 7-13).  Baseline serum creatinine has been approximately 4 when last seen 08/24/2021.  At that visit the patient had trace to 1+ lower extremity edema at a weight of 120 kg.      He presented to the ED today with complaints of approximately 20 pound weight gain over the  past several weeks in spite of his current diuretic regimen which has consisted of just 40 mg of Lasix every morning.  His current weight on admission was 122.5 kg.  Serum creatinine is currently 4.54.  We have been asked to help in the assessment and management of his advancing CKD and hypervolemia.    Chest x-ray demonstrates some vascular congestion and small bilateral pleural effusions.  The patient has ++ orthopnea and DOE.    Urinalysis is again bland except for heavy proteinuria by dipstick.    The patient has previously completed ESKD education and has voiced a preference for CCPD or preemptive transplant in the event of ESKD.  He has been referred to [facility] transplant but has not yet had his first visit there.  He has an AVF placed by Dr. Linzie Collin in July        Past Medical History:     Past Medical History:   Diagnosis Date    Abnormal vision     Arthritis     Closed displaced transverse fracture of left patella 16/38/4665    Complication of anesthesia     last time he was kicking his legs had to wake him up    Diabetic ulcer of left foot 12/26/2014    Disc     End-stage renal disease 2021    stage  5 (2023)    Gastroesophageal reflux disease     Hyperlipidemia     Hypertension     Left patella fracture 09/10/2020    Low back pain     Neuropathy     S/P ORIF (open reduction internal fixation) fracture 09/21/2020    Patella left knee on 09/21/20    Seasonal allergic rhinitis     Sleep apnea     not tested    Type 2 diabetes mellitus, controlled        Past Surgical History:     Past Surgical History:   Procedure Laterality Date    ARTHRODESIS, TOE Left 05/04/2015    Procedure: ARTHRODESIS, TOE;  Surgeon: Janora Norlander, DPM;  Location: [facility];  Service: Podiatry;  Laterality: Left;  1st MPJ FUSION    DEBRIDEMENT & IRRIGATION, LOWER EXTREMITY Left 03/12/2015    Procedure: [place], LOWER EXTREMITY;  Surgeon: Janora Norlander, DPM;  Location: [facility];  Service:  Podiatry;  Laterality: Left;  I&D LEFT FOOT FOR BONE INFECTION    DEBRIDEMENT & IRRIGATION, LOWER EXTREMITY Left 03/07/2016    Procedure: DEBRIDEMENT & IRRIGATION, LEFT FOOT 2ND TOE, EXCISION OF 2ND METATARSEL;  Surgeon: Janora Norlander, DPM;  Location: [facility];  Service: Podiatry;  Laterality: Left;  I&D Left Foot    FOOT SURGERY Right 05/2020    [facility]; 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; r/t Charc[place]    FORMATION, UPPER EXTREMITY, A-V FISTULA Left 09/06/2021    Procedure: LEFT FORMATION, A-V FISTULA, UPPER EXTREMITY;  Surgeon: Franklyn Lor, MD;  Location: [facility];  Service: General;  Laterality: Left;    HAND SURGERY      JOINT REPLACEMENT Left     left big toe; cadaver joint    ORIF, PATELLA Left 09/21/2020    Procedure: ORIF, LEFT KNEE PATELLA;  Surgeon: Eugenie Filler, MD;  Location: [facility];  Service: Orthopedics;  Laterality: Left;    ORIF, PATELLA Left 12/24/2020    Procedure: LEFT PATELLA HARDWARE REVISION / ORIF;  Surgeon: Marlin Canary, MD;  Location: [facility];  Service: Orthopedics;  Laterality: Left;    SESAMOIDECTOMY Left 05/24/2019    Procedure: SESAMOIDECTOMY;  Surgeon: [name], DPM;  Location: [facility];  Service: Podiatry;  Laterality: Left;         Family History:     Family History   Problem Relation Age of Onset    Diabetes Mother     Lung cancer Mother     Cancer Mother         lung    Depression Sister     COPD Sister     Cancer Sister         breast    Hypertension Brother          Social History:     Social History     Socioeconomic History    Marital status: Married     Spouse name: None    Number of children: None    Years of education: None    Highest education level: None   Occupational History    Occupation: unemployed   Tobacco Use    Smoking status: Former     Packs/day: .25     Types: Cigarettes     Quit date: 09/06/2017     Years since quitting: 4.2    Smokeless tobacco: Never    Tobacco comments:     3  packs a week    Vaping Use    Vaping Use: Never used   Substance and Sexual Activity    Alcohol use: No     Alcohol/week: 0.0 standard drinks of alcohol     Comment: in past    Drug use: No     Comment: in past    Sexual activity: Yes     Partners: Female     Birth control/protection: None   Other Topics Concern    None   Social History Narrative    None     Social Determinants of Health     Financial Resource Strain: Not on file   Food Insecurity: No Food Insecurity (09/11/2020)    Hunger Vital Sign     Worried About Running Out of Food in the Last Year: Never true     Ran Out of Food in the Last Year: Never true   Transportation Needs: Not on file   Physical Activity: Not on file   Stress: Not on file   Social Connections: Not on file   Intimate Partner Violence: Not on file   Housing Stability: Not on file         Allergies:     Allergies   Allergen Reactions    Influenza Virus Vaccine Anaphylaxis       Medications:     Prior to Admission medications    Medication Sig Start Date End Date Taking? Authorizing Provider   amLODIPine (NORVASC) 10 MG tablet Take 1 tablet (10 mg) by mouth daily 07/13/21 07/13/22 Yes [provider]   aspirin EC 81 MG EC tablet Take 1 tablet (81 mg) by mouth every morning   Yes [provider]   atorvastatin (LIPITOR) 40 MG tablet Take 1 tablet (40 mg) by mouth nightly   Yes [provider]   furosemide (LASIX) 40 MG tablet Take 1 tablet (40 mg) by mouth every morning   Yes [provider]   gabapentin (NEURONTIN) 300 MG capsule Take 1 capsule (300 mg) by mouth 3 (three) times daily 06/17/21  Yes Rhunette Croft, MD   hydrALAZINE (APRESOLINE) 50 MG tablet Take 1 tablet (50 mg) by mouth every morning 07/13/21 07/13/22 Yes [provider]   insulin aspart (NovoLOG FlexPen ReliOn) 100 UNIT/ML injection pen Inject into the skin 3 (three) times daily before meals Sliding scale   Yes [provider]   insulin glargine (LANTUS SOLOSTAR) 100 UNIT/ML  injection pen Inject 68 Units into the skin nightly   Yes [provider]   liraglutide (Victoza) 18 MG/3ML injection Inject 1.8 mg into the skin every morning   Yes [provider]   metoprolol tartrate (LOPRESSOR) 50 MG tablet Take 1 tablet (50 mg) by mouth 2 (two) times daily 07/30/21  Yes Rhunette Croft, MD   Multiple Vitamins-Minerals (Multi For Him 50+) Tab Take 1 tablet by mouth every morning 08/11/20  Yes [provider]   sodium bicarbonate 650 MG tablet Take 1 tablet (650 mg) by mouth 2 (two) times daily   Yes [provider]   vitamin D (CHOLECALCIFEROL) 25 MCG (1000 UT) tablet Take 2 tablets (2,000 Units) by mouth 2 (two) times daily   Yes [provider]   amLODIPine (NORVASC) 5 MG tablet Take 1 tablet (5 mg) by mouth daily  11/21/21  [provider]   aspirin EC 81 MG EC tablet Take 1 tablet (81 mg total) by mouth every evening. 07/01/16 11/21/21  Hervey Ard,  Skyler T, NP   atorvastatin (LIPITOR) 40 MG tablet TAKE 1 TABLET BY MOUTH ONCE DAILY 03/05/20 11/21/21  Tanja Port, MD   azelastine (ASTELIN) 0.1 % nasal spray 1 spray by Nasal route 2 (two) times daily Use in each nostril as directed  Patient taking differently: 1 spray by Nasal route 2 (two) times daily as needed Use in each nostril as directed 07/30/21 11/21/21  Rhunette Croft, MD   Continuous Blood Gluc Receiver (FreeStyle [place] 2 Reader) Device Use as directed to monitor blood sugar;  E11.65 08/30/21 11/21/21  Briscoe Burns, MD   Continuous Blood Gluc Sensor (FreeStyle [place] 2 Sensor) Misc Use as directed to monitor blood sugar; change every 14 days;  E11.65 09/13/21 11/21/21  Briscoe Burns, MD   ergocalciferol (ERGOCALCIFEROL) 1.25 MG (50000 UT) capsule One capsule weekly x4 weeks, then one capsule every other week 04/23/21 11/21/21  Briscoe Burns, MD   furosemide (LASIX) 40 MG tablet TAKE 1 TABLET BY MOUTH ONCE DAILY 10/28/21 11/21/21  Rhunette Croft, MD   glucose blood  test strip 1 each by Other route 4 (four) times daily Use as instructed  11/21/21  [provider]   insulin aspart (NovoLOG FlexPen) 100 UNIT/ML injection pen 6 units breakfast and lunch, 10 units supper + correction up to 50 units per day 09/13/21 11/21/21  Briscoe Burns, MD   insulin glargine (LANTUS SOLOSTAR) 100 UNIT/ML injection pen Inject 68 Units into the skin daily 09/13/21 11/21/21  Briscoe Burns, MD   Insulin Pen Needle 31G X 6 MM Misc Use Use up to 7 times a day as directed  11/21/21  [provider]   liraglutide (Victoza) 18 MG/3ML injection INJECT 1.8MG  SUBCUTANEOUSLY ONCE DAILY 04/12/21 11/21/21  Briscoe Burns, MD      Scheduled Meds:  Continuous Infusions:  PRN Meds:.      Review of Systems:   See HPI above for pertinent positives and negatives; all other systems reviewed and negative.      Physical Exam:     Vitals: BP 138/64   Pulse 88   Temp 97.4 F (36.3 C) (Temporal)   Resp 22   Ht 1.702 m (5\' 7" )   Wt 122.5 kg (270 lb)   SpO2 95%   BMI 42.29 kg/m         I/O: No intake or output data in the 24 hours ending 11/21/21 1411      Weights:  Wt Readings from Last 1 Encounters:   11/21/21 0832 122.5 kg (270 lb)       Physical Exam   Constitutional:  Oriented to person, place, and time. Mild respiratory distress.   HEENT: Normocephalic and atraumatic. Mouth/Throat: Oropharynx is clear and moist.   Eyes: Conjunctivae normal and EOM are normal. Pupils are equal, round, and reactive to light. No scleral icterus. No proptosis or exopthalmos.  Neck: Normal range of motion. Neck supple. No JVD present.   Cardiovascular: Normal rate, regular rhythm, normal heart sounds and intact distal pulses.  Exam reveals no gallop and no friction rub.  No murmur heard.  Pulmonary/Chest: reduced BS at both bases  Abdominal: Soft. Bowel sounds are normal.  Mild distension. No palpable masses. There is no tenderness. There is no rebound and no guarding.   Musculoskeletal: No synovitis, tophi or  nodules.  Extremities: ++ LE edema  Lymphadenopathy: No palpable LAD  Neurological:  alert and oriented to person, place, and time.  has symmetric reflexes.  Skin: Skin is warm and  dry. No rash noted. Not diaphoretic. No erythema. No pallor.   Psychiatric:  normal mood and affect. behavior is normal. Thought content normal.   Dialysis Access: AVF/AVG with +B/T        Labs Reviewed:     I have reviewed all the lab results for this admission.    Recent Labs     11/21/21  0845   WBC 10.0   Hemoglobin 10.2*   Hematocrit 32.7*   PLT CT 242     No results for input(s): "PT", "INR", "APTT" in the last 72 hours.  Recent Labs     11/21/21  0841   Troponin I 0.02     Recent Labs     11/21/21  0841   B-Natriuretic Peptide 820.7*                      Recent Labs     11/21/21  0845   Glucose 129*   Sodium 142   Potassium 4.3   Chloride 109   CO2 21.4   BUN 59*   Creatinine 4.54*   EGFR 14*   Calcium 9.2     No results for input(s): "MG", "PHOS" in the last 72 hours.  Recent Labs     11/21/21  0845   Albumin 3.0*   Protein, Total 6.7   Bilirubin, Total 0.3   Alkaline Phosphatase 161*   ALT 18   AST (SGOT) 16    Recent Labs     11/21/21  1116   Urine Specific Gravity 1.008   pH, Urine 6.0   Protein, UR >=500*   Glucose, UA 50*   Ketones UA Negative   Bilirubin, UA Negative   Blood, UA Negative   Nitrite, UA Negative   Urobilinogen, UA Normal   Leukocyte Esterase, UA Negative   WBC, UA 1   RBC, UA 1                    Rads:   Radiological Procedures reviewed for this admission.     XR Chest 2 Views    Result Date: 11/21/2021  IMPRESSION: Interstitial edema and bilateral effusions. Atelectasis versus alveolar edema in the lung bases. ReadingStation:[email or web address]       Blanchard Mane, MD, Pager [phone]  2:11 PM  11/21/2021  [facility]  Dr. Josephina Shih, Dr. Fernande Boyden, Dr. Oswaldo Milian, Dr. Recardo Evangelist, Artis Delay NP, Maryelizabeth Kaufmann NP.   Office: [phone]      cc: Shibeshi, Woldecherkos A*  Rhunette Croft, MD

## 2021-11-21 NOTE — Plan of Care (Addendum)
Pt arrived to unit from ED. Vital signs stable. No shortness of breath. Significant lower extremity edema noted with LLE weeping. Oriented to unit. Explained fluid restriction, daily weights, strict I&Os.     Lasix drip initiated.     622ml output 3hrs after drip initiated. MD notified. Continue at current rate, 51ml/hr. 10mg /hr.     Problem: Hemodynamic Status: Cardiac  Goal: Stable vital signs and fluid balance  Outcome: Progressing     Problem: Ineffective Gas Exchange  Goal: Effective breathing pattern  Outcome: Progressing     Problem: Impaired Mobility  Goal: Mobility/Activity is maintained at optimal level for patient  Outcome: Progressing

## 2021-11-21 NOTE — ED Notes (Signed)
[facility] EMERGENCY DEPARTMENT  ED NURSING NOTE FOR THE RECEIVING INPATIENT NURSE   ED NURSE PHONE: [phone]    ADMISSION INFORMATION   [name] is a 56 y.o. male admitted with an ED diagnosis of:  1. CKD (chronic kidney disease), stage V    2. Generalized edema    3. Shortness of breath    4. Acute congestive heart failure, unspecified heart failure type      ED Pre-Departure Assessment:  pt informed of room assignment, denies any questions or needs at this time. VS updated before transport.   NURSING CARE   Isolation No active isolations   Current O2 Device None (Room air)     Home O2 No     Patient Comes From: Home Independent   Documents accompanying patient: not applicable   Mental Status: alert and oriented   Ambulation: 1 person assist   Pertinent Info/Safety Concern: None   Report called to receiving RN?: No   ED nurse will obtain a full set of vital signs including temperature and document prior to patient departure

## 2021-11-21 NOTE — H&P (Signed)
[facility]      Patient: [name]  Date: 11/21/2021   DOB: 12-Feb-1966  Admission Date: 11/21/2021   MRN: [id number]  Attending:  Wendee Copp, NP    PRIMARY CARE MD: Rhunette Croft, MD    Chief Complaint: sob   History Gathered From: Self, Wife, and EMS/ED notes.    HISTORY     HPI:  Pt. is an 56 y.o. y/o male w/  PMHx of  has a past medical history of Abnormal vision, Arthritis, Closed displaced transverse fracture of left patella (1/93/7902), Complication of anesthesia, Diabetes mellitus, Diabetic foot ulcer (03/10/2015), Diabetic ulcer of left foot (12/26/2014), Disc, End-stage renal disease (2021), Gastroesophageal reflux disease, Hyperlipidemia, Hypertension, Left patella fracture (09/10/2020), Low back pain, Neuropathy, S/P ORIF (open reduction internal fixation) fracture (09/21/2020), Seasonal allergic rhinitis, Sleep apnea, and Type 2 diabetes mellitus, controlled.  who presents with CC of loss of breath and difficulty sleeping. Patient reports he has had swelling in his legs for months but now until recently it left leg draining.  Patient reports difficulty sleeping, cannot lay flat cannot lay in bed and now having difficulty in recliner.  Patient reports who except gasping for air.  Left lower leg draining clear liquid fluid.  No chest pain.  No nausea vomiting diarrhea.  Afebrile no chills.  Patient with AV fistula to left upper arm, patient follows with nephrology Dr. Fernande Boyden, reports his plan is to start peritoneal dialysis but no PD catheter in place yet and AV fistula is for backup.  Work-up in ED revealed WBCs 10.0, H&H 10.2/32.7.  Glucose level 129.  BUN 59, creatinine 4.54.  eGFR 14, ALP 161.  Troponin normal, BNP 820.7.  Urinalysis greater than 500 protein and glucose of 50.  Chest x-ray with interstitial edema and bilateral effusions.  Patient was given 80 mg IV Lasix while in ED.     I discussed case with ED provider Dr. Tama Headings and decision was made to admit for  further management and monitoring of volume overload secondary to CKD.    Differential diagnosis  Heart failure  Pulmonary edema  AKI  CKD    Subjective     Prior to Admission medications    Medication Sig Start Date End Date Taking? Authorizing Provider   amLODIPine (NORVASC) 10 MG tablet Take 1 tablet (10 mg) by mouth daily 07/13/21 07/13/22 Yes [provider]   aspirin EC 81 MG EC tablet Take 1 tablet (81 mg) by mouth every morning   Yes [provider]   atorvastatin (LIPITOR) 40 MG tablet Take 1 tablet (40 mg) by mouth nightly   Yes [provider]   furosemide (LASIX) 40 MG tablet Take 1 tablet (40 mg) by mouth every morning   Yes [provider]   gabapentin (NEURONTIN) 300 MG capsule Take 1 capsule (300 mg) by mouth 3 (three) times daily 06/17/21  Yes Rhunette Croft, MD   hydrALAZINE (APRESOLINE) 50 MG tablet Take 1 tablet (50 mg) by mouth every morning 07/13/21 07/13/22 Yes [provider]   insulin aspart (NovoLOG FlexPen ReliOn) 100 UNIT/ML injection pen Inject into the skin 3 (three) times daily before meals Sliding scale   Yes [provider]   insulin glargine (LANTUS SOLOSTAR) 100 UNIT/ML injection pen Inject 68 Units into the skin nightly   Yes [provider]   liraglutide (Victoza) 18 MG/3ML injection Inject 1.8 mg into the skin every morning   Yes [provider]  metoprolol tartrate (LOPRESSOR) 50 MG tablet Take 1 tablet (50 mg) by mouth 2 (two) times daily 07/30/21  Yes Rhunette Croft, MD   Multiple Vitamins-Minerals (Multi For Him 50+) Tab Take 1 tablet by mouth every morning 08/11/20  Yes [provider]   sodium bicarbonate 650 MG tablet Take 1 tablet (650 mg) by mouth 2 (two) times daily   Yes [provider]   vitamin D (CHOLECALCIFEROL) 25 MCG (1000 UT) tablet Take 2 tablets (2,000 Units) by mouth 2 (two) times daily   Yes [provider]   amLODIPine (NORVASC) 5 MG tablet Take 1  tablet (5 mg) by mouth daily  11/21/21  [provider]   aspirin EC 81 MG EC tablet Take 1 tablet (81 mg total) by mouth every evening. 07/01/16 11/21/21  Janeece Riggers T, NP   atorvastatin (LIPITOR) 40 MG tablet TAKE 1 TABLET BY MOUTH ONCE DAILY 03/05/20 11/21/21  Tanja Port, MD   azelastine (ASTELIN) 0.1 % nasal spray 1 spray by Nasal route 2 (two) times daily Use in each nostril as directed  Patient taking differently: 1 spray by Nasal route 2 (two) times daily as needed Use in each nostril as directed 07/30/21 11/21/21  Rhunette Croft, MD   Continuous Blood Gluc Receiver (FreeStyle Ute 2 Reader) Device Use as directed to monitor blood sugar;  E11.65 08/30/21 11/21/21  Briscoe Burns, MD   Continuous Blood Gluc Sensor (FreeStyle Lorenzo 2 Sensor) Misc Use as directed to monitor blood sugar; change every 14 days;  E11.65 09/13/21 11/21/21  Briscoe Burns, MD   ergocalciferol (ERGOCALCIFEROL) 1.25 MG (50000 UT) capsule One capsule weekly x4 weeks, then one capsule every other week 04/23/21 11/21/21  Briscoe Burns, MD   furosemide (LASIX) 40 MG tablet TAKE 1 TABLET BY MOUTH ONCE DAILY 10/28/21 11/21/21  Rhunette Croft, MD   glucose blood test strip 1 each by Other route 4 (four) times daily Use as instructed  11/21/21  [provider]   insulin aspart (NovoLOG FlexPen) 100 UNIT/ML injection pen 6 units breakfast and lunch, 10 units supper + correction up to 50 units per day 09/13/21 11/21/21  Briscoe Burns, MD   insulin glargine (LANTUS SOLOSTAR) 100 UNIT/ML injection pen Inject 68 Units into the skin daily 09/13/21 11/21/21  Briscoe Burns, MD   Insulin Pen Needle 31G X 6 MM Misc Use Use up to 7 times a day as directed  11/21/21  [provider]   liraglutide (Victoza) 18 MG/3ML injection INJECT 1.8MG  SUBCUTANEOUSLY ONCE DAILY 04/12/21 11/21/21  Briscoe Burns, MD       Allergy:   Allergies   Allergen Reactions    Influenza Virus Vaccine Anaphylaxis       Family History: reviewed  and negative except what's noted below  Family History   Problem Relation Age of Onset    Diabetes Mother     Lung cancer Mother     Cancer Mother         lung    Depression Sister     COPD Sister     Cancer Sister         breast    Hypertension Brother      Social History:   Social History     Tobacco Use    Smoking status: Former     Packs/day: .25     Types: Cigarettes     Quit date: 09/06/2017     Years since quitting: 4.2  Smokeless tobacco: Never    Tobacco comments:     3 packs a week   Vaping Use    Vaping Use: Never used   Substance Use Topics    Alcohol use: No     Alcohol/week: 0.0 standard drinks of alcohol     Comment: in past    Drug use: No     Comment: in past     PMHx:  Past Medical History:   Diagnosis Date    Abnormal vision     Arthritis     Closed displaced transverse fracture of left patella 5/40/9811    Complication of anesthesia     last time he was kicking his legs had to wake him up    Diabetes mellitus     Diabetic foot ulcer 03/10/2015    Diabetic ulcer of left foot 12/26/2014    Disc     End-stage renal disease 2021    stage 5 (2023)    Gastroesophageal reflux disease     Hyperlipidemia     Hypertension     Left patella fracture 09/10/2020    Low back pain     Neuropathy     S/P ORIF (open reduction internal fixation) fracture 09/21/2020    Patella left knee on 09/21/20    Seasonal allergic rhinitis     Sleep apnea     not tested    Type 2 diabetes mellitus, controlled      PSHx:  Past Surgical History:   Procedure Laterality Date    ARTHRODESIS, TOE Left 05/04/2015    Procedure: ARTHRODESIS, TOE;  Surgeon: Janora Norlander, DPM;  Location: [facility];  Service: Podiatry;  Laterality: Left;  1st MPJ FUSION    DEBRIDEMENT & IRRIGATION, LOWER EXTREMITY Left 03/12/2015    Procedure: [place], LOWER EXTREMITY;  Surgeon: Janora Norlander, DPM;  Location: [facility];  Service: Podiatry;  Laterality: Left;  I&D LEFT FOOT FOR BONE INFECTION     DEBRIDEMENT & IRRIGATION, LOWER EXTREMITY Left 03/07/2016    Procedure: DEBRIDEMENT & IRRIGATION, LEFT FOOT 2ND TOE, EXCISION OF 2ND METATARSEL;  Surgeon: Janora Norlander, DPM;  Location: [facility];  Service: Podiatry;  Laterality: Left;  I&D Left Foot    FOOT SURGERY Right 05/2020    UVA; 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; r/t [place]    FORMATION, UPPER EXTREMITY, A-V FISTULA Left 09/06/2021    Procedure: LEFT FORMATION, A-V FISTULA, UPPER EXTREMITY;  Surgeon: Franklyn Lor, MD;  Location: [facility];  Service: General;  Laterality: Left;    HAND SURGERY      JOINT REPLACEMENT Left     left big toe; cadaver joint    ORIF, PATELLA Left 09/21/2020    Procedure: ORIF, LEFT KNEE PATELLA;  Surgeon: Eugenie Filler, MD;  Location: [place];  Service: Orthopedics;  Laterality: Left;    ORIF, PATELLA Left 12/24/2020    Procedure: LEFT PATELLA HARDWARE REVISION / ORIF;  Surgeon: Marlin Canary, MD;  Location: [facility];  Service: Orthopedics;  Laterality: Left;    SESAMOIDECTOMY Left 05/24/2019    Procedure: SESAMOIDECTOMY;  Surgeon: [name], DPM;  Location: [place];  Service: Podiatry;  Laterality: Left;     REVIEW OF SYSTEMS     Pt. denies nausea, vomiting, chest pain, abdominal pain, dysuria, diarrhea, vision changes, new-weakness.   Pt. endorses shortness of breath, inability to sleep, weight gain  Otherwise; per HPI; all other systems were  reviewed and negative.     PHYSICAL EXAM   Vitals:  T:97.4 F (36.3 C) (Temporal)   BP:143/60, HR:88, RR:22, SaO2:94%  General: No Acute Distress.  Fatigue, obesity  HEENT:  NCAT, MMM, EOMI, PERRL  Cardiovascular: RRR. No Murmurs. Brisk cap refill  Respiratory: CTAB. No rales, rhonchi or retractions.  Orthopnea, dyspnea  Gastrointestinal: nt, nd. Normoactive BS  Integument: Skin intact, no bruises.  The media  Extremities: Expected degrees of motion in all 4 extremities.  biLateral lower and upper leg pitting  edema  Neurologic: No focal neurologic abnormalities or gross focal deficit  Psychiatric: AAOx3, cooperative.         Objective     LABS & IMAGING     Recent Results (from the past 24 hour(s))   Troponin I    Collection Time: 11/21/21  8:41 AM   Result Value Ref Range    Troponin I 0.02 0.00 - 0.02 ng/mL   B-type Natriuretic Peptide    Collection Time: 11/21/21  8:41 AM   Result Value Ref Range    B-Natriuretic Peptide 820.7 (H) 0.0 - 100.0 pg/mL   Comprehensive metabolic panel    Collection Time: 11/21/21  8:45 AM   Result Value Ref Range    Sodium 142 136 - 147 mMol/L    Potassium 4.3 3.5 - 5.3 mMol/L    Chloride 109 98 - 110 mMol/L    CO2 21.4 20.0 - 30.0 mMol/L    Calcium 9.2 8.5 - 10.5 mg/dL    Glucose 129 (H) 71 - 99 mg/dL    Creatinine 4.54 (H) 0.80 - 1.30 mg/dL    BUN 59 (H) 7 - 22 mg/dL    Protein, Total 6.7 6.0 - 8.3 gm/dL    Albumin 3.0 (L) 3.5 - 5.0 gm/dL    Alkaline Phosphatase 161 (H) 40 - 145 U/L    ALT 18 0 - 55 U/L    AST (SGOT) 16 10 - 42 U/L    Bilirubin, Total 0.3 0.1 - 1.2 mg/dL    Albumin/Globulin Ratio 0.81 0.80 - 2.00 Ratio    Anion Gap 15.9 7.0 - 18.0 mMol/L    BUN / Creatinine Ratio 13.0 10.0 - 30.0 Ratio    EGFR 14 (L) 60 - 150 mL/min/1.16m2    Osmolality Calculated 301 (H) 275 - 300 mOsm/kg    Globulin 3.7 2.0 - 4.0 gm/dL   CBC and differential    Collection Time: 11/21/21  8:45 AM   Result Value Ref Range    WBC 10.0 4.0 - 11.0 K/cmm    RBC 3.55 (L) 4.00 - 5.70 M/cmm    Hemoglobin 10.2 (L) 13.0 - 17.5 gm/dL    Hematocrit 32.7 (L) 39.0 - 52.5 %    MCV 92 80 - 100 fL    MCH 29 28 - 35 pg    MCHC 31 (L) 32 - 36 gm/dL    RDW 13.7 11.0 - 14.0 %    PLT CT 242 130 - 440 K/cmm    MPV 9.3 6.0 - 10.0 fL    Neutrophils % 76.8 42.0 - 78.0 %    Lymphocytes 14.8 (L) 15.0 - 46.0 %    Monocytes 7.4 3.0 - 15.0 %    Eosinophils % 0.8 0.0 - 7.0 %    Basophils % 0.1 0.0 - 3.0 %    Neutrophils Absolute 7.7 1.7 - 8.6 K/cmm    Lymphocytes Absolute 1.5 0.6 - 5.1 K/cmm    Monocytes Absolute 0.7  0.1 - 1.7 K/cmm     Eosinophils Absolute 0.1 0.0 - 0.8 K/cmm    Basophils Absolute 0.0 0.0 - 0.3 K/cmm   Collect Blood    Collection Time: 11/21/21  8:45 AM   Result Value Ref Range    Collect Blood Label Notification    ECG 12 lead (Stat) (Cardiac Related)    Collection Time: 11/21/21  9:55 AM   Result Value Ref Range    Patient Age 31 years    Patient DOB June 07, 1965     Patient Height      Patient Weight      Interpretation Text       Sinus rhythm  Short PR interval  Abnormal R-wave progression, late transition  Baseline wander in lead(s) V3      Physician Interpreter      Ventricular Rate 74 //min    QRS Duration 103 ms    P-R Interval 44 ms    Q-T Interval 402 ms    Q-T Interval(Corrected) 446 ms    P Wave Axis 65 deg    QRS Axis 13 deg    T Axis 54 years   Urinalysis w Microscopic and Culture if Indicated    Collection Time: 11/21/21 11:16 AM    Specimen: Urine, Random   Result Value Ref Range    Color, UA Straw Colorless,Yellow,Straw    Clarity, UA Clear Clear    Urine Specific Gravity 1.008 1.001 - 1.040    pH, Urine 6.0 5.0 - 8.0 pH    Protein, UR >=500 (A) Negative mg/dL    Glucose, UA 50 (A) Negative mg/dL    Ketones UA Negative Negative,5 mg/dL    Bilirubin, UA Negative Negative    Blood, UA Negative Negative    Nitrite, UA Negative Negative    Urobilinogen, UA Normal Normal mg/dL    Leukocyte Esterase, UA Negative Negative Leu/uL    UR Micro Performed     WBC, UA 1 0 - 4 /hpf    RBC, UA 1 0 - 4 /hpf     IMAGING:  XR Chest 2 Views   Final Result   IMPRESSION:    Interstitial edema and bilateral effusions.   Atelectasis versus alveolar edema in the lung bases.      ReadingStation:[id number]        EKG: Sinus rhythm, rate of 74, short PR interval abnormal R progression late transition.  No ischemia noted    CARDIAC Markers:  Recent Labs   Lab 11/21/21  0841   Troponin I 0.02         ASSESSMENT & PLAN     #Acute pulmonary edema  AKI on CKD 5  Persistent proteinuria  Avoid nephrotoxins  Discussed with nephrology, will see in  consultation  Lasix drip ordered  Strict I&O monitoring  Monitor daily weights  1.5 L fluid restrictions    #Anemia in CKD  H&H 10.2/32.7, stable    #Hypertension  BP 143/60  Hold metoprolol and amlodipine due to diuresis and AKI    #Sleep apnea  No CPAP use  RCAT, continue supplemental O2 as needed    #Diabetes mellitus  Continue Lantus, glucose 128, dose decreased while inpatient  Continue consistent carb diet  Accu-Cheks and sliding scale insulin as needed    Neuropathy  Hold gabapentin at this time due to AKI    #Hyperlipidemia  Continue statin    #GERD  Continue reflux medications    Obesity BMI 37.48  Complicates all aspects  of care    DVT PPx: heparin subcu injections  Dispo: Pending Medical Progress    Code: full code  Proxy: Spouse    Plan of care and pertinent results discussed with patient and spouse at bedside. Questions addressed. Verbalized their understanding and agreement.    Signed:   Wendee Copp, NP      [facility]                 Please contact my rounding number for any questions.     11/21/2021 1:12 PM

## 2021-11-21 NOTE — ED Provider Notes (Signed)
[facility]  History and Physical Exam     Patient Name: [name], [name]  Encounter Date:  11/21/2021  Physician Assistant: Jaye Beagle, [place]  Attending Physician: Vonna Drafts, MD  Room:  E53/E53-A  Patient DOB:  1965-10-14  Age: 56 y.o. male  MRN:  [id number]  PCP: Rhunette Croft, MD      Diagnosis/Disposition:  [name] COURSE/MDM:     Final Impression  No diagnosis found.  Disposition  [name] Disposition       None          Follow up  No follow-up provider specified.  Prescriptions  New Prescriptions    No medications on file             Old medical records and nursing/triage notes were reviewed by myself.      Chief complaint: Leg Swelling       Differential Diagnosis Considered: Compartment syndrome,Heart failure, liver failure, AKI, CKD, pulmonary edema, nephrotic syndrome, DVT, venous insufficiency, osteomyelitis, necrotizing fasciitis, myositis, and cellulitis      All tests were ordered by me and are noted below. The results were independently reviewed and interpreted by myself. The significant findings are discussed in [name] course below. If an EKG was obtained, my independent interpretation is noted below      Meds Given In [name]: (Red Colored Meds indicate High Risk Medications that Required Monitoring for adverse effects or deterioration:    Medications - No data to display    [name] Course as of 11/21/21 1114   Sun Nov 21, 2021   1057 XR Chest 2 Views  IMPRESSION:   Interstitial edema and bilateral effusions.  Atelectasis versus alveolar edema in the lung bases.     ReadingStation:[facility] [NC]   8937 Nephrology paged [NC]      [name] Course User Index  [NC] Jaye Beagle, PA       Discussion:        All questions and concerns answered.  Patient appreciative of care.  Red flags were discussed with patient and/or family.  Patient understands to immediately return to the [name] if they began to develop new or worsening red flag symptoms    Discussions regarding  hospitalization or transfer: {YES Including:59203::"No"}    Discussion with Consultants: {Hospitalist:59367}    MDM Complexity:    History obtained from:{DP HPI HX:26576}  HPI/ROS is limited by: {RRSHPILIMITS:33997::"none"}    Past Medical Records reviewed: {Records Reviewed:59200}    Other Medical Conditions that Impact Care (Highlighted in RED):   PMHx:   Past Medical History:   Diagnosis Date    Abnormal vision     Arthritis     Closed displaced transverse fracture of left patella 3/42/8768    Complication of anesthesia     last time he was kicking his legs had to wake him up    Diabetes mellitus     Diabetic foot ulcer 03/10/2015    Diabetic ulcer of left foot 12/26/2014    Disc     End-stage renal disease 2021    stage 5 (2023)    Gastroesophageal reflux disease     Hyperlipidemia     Hypertension     Left patella fracture 09/10/2020    Low back pain     Neuropathy     S/P ORIF (open reduction internal fixation) fracture 09/21/2020    Patella left knee on 09/21/20    Seasonal allergic rhinitis     Sleep apnea  not tested    Type 2 diabetes mellitus, controlled        Procedures: {YES Including:59203::"No"} See Procedure Section below for procedure note for details    Prescription Management: See Diagnosis/Disposition area for Prescription Management details    Social Determinants of Health that Impact Care: {Social Determinants:59201::"None that negatively impact care"}      {See ACEP Guide.:58852}             {NCPhysician:60470::"Case and results discussed directly with supervising physician who agrees with treatment plan"}    In addition to the above history, please see nursing notes. Allergies, meds, past medical, family, social hx, and the results of the diagnostic studies performed have been reviewed by myself.    This chart was generated by an EMR and may contain errors or omissions not intended by the user.      History of Presenting Illness:     Nurse Triage: left lower leg swelling and draining clear  fluids mixed with blood, denies any injury. , kidney diease pt does not have dialysis going yet but has a left arm fistula    Chief complaint: Leg Swelling    HPI/ROS given by: patient    [name] is a 56 y.o. male with a past medical history of end-stage renal disease with AV fistula currently not on dialysis, diabetes reports emergency department for fluid coming out of left lower extremity.  Wife also concerned for fluid retention.  She states that he cannot get into see a sleep apnea doctor until December.  Patient states that he is not able to sleep for more than an hour night and typically wakes up gasping for air.  She states that he has gained about 20 pounds since the summer.  Patient follows with Dr. Fernande Boyden.  He is currently on Lasix 40 mg tablet and has been taking it daily..  Once the Jonita Albee is going on here        Review of Systems:  Physical Exam:     Review of Systems   Respiratory:  Positive for shortness of breath.    Cardiovascular:  Positive for leg swelling.           Blood pressure 143/60, pulse 88, temperature 97.4 F (36.3 C), temperature source Temporal, resp. rate 22, height 1.702 m, weight 122.5 kg, SpO2 94 %.     Constitutional: WD/WN, active, NAD   Eyes: PERRL.  Sclera anicteric, no discharge.  Visual acuity grossly intact.    HENT  Head:  NCAT.   Ears: No external lesions.   Nose: No external lesions or discharge.    Oropharynx: clear, MMM.   Neck: Trachea midline. No JVD. Normal ROM. No apparent masses.  Respiratory: Effort normal without accessory muscle use. CTAB.  Cardiovascular: RRR, no MRG.  +3 pitting edema up with venous stasis of his left lower extremity.  Slight crackles heard at base of lungs.  Abdomen: Soft, non-distended, non-tender. No palpable masses or hernias.  Genitourinary/rectal: Deferred   Musculoskeletal: Normal ROM. No deformity. Strength grossly intact throughout.  Neurological: Pt is alert. No focal motor deficits. Speech normal. CN II-XII grossly  normal.   Psychiatric: Affect appropriate. No agitation.   Skin: Warm, dry, well perfused. No rash noted.           PROCEDURES/EKG/CRITICAL CARE           EKG:   EKG Results       ** No results found for the last 48 hours. **  Diagnostic Results:     LAB STUDIES    All lab value have been personally reviewed by me    Results       Procedure Component Value Units Date/Time    Comprehensive metabolic panel [[id number]]  (Abnormal) Collected: 11/21/21 0845    Specimen: Plasma Updated: 11/21/21 0926     Sodium 142 mMol/L      Potassium 4.3 mMol/L      Chloride 109 mMol/L      CO2 21.4 mMol/L      Calcium 9.2 mg/dL      Glucose 129 mg/dL      Creatinine 4.54 mg/dL      BUN 59 mg/dL      Protein, Total 6.7 gm/dL      Albumin 3.0 gm/dL      Alkaline Phosphatase 161 U/L      ALT 18 U/L      AST (SGOT) 16 U/L      Bilirubin, Total 0.3 mg/dL      Albumin/Globulin Ratio 0.81 Ratio      Anion Gap 15.9 mMol/L      BUN / Creatinine Ratio 13.0 Ratio      EGFR 14 mL/min/1.11m2      Osmolality Calculated 301 mOsm/kg      Globulin 3.7 gm/dL     CBC and differential [[id number]]  (Abnormal) Collected: 11/21/21 0845    Specimen: Blood Updated: 11/21/21 0906     WBC 10.0 K/cmm      RBC 3.55 M/cmm      Hemoglobin 10.2 gm/dL      Hematocrit 32.7 %      MCV 92 fL      MCH 29 pg      MCHC 31 gm/dL      RDW 13.7 %      PLT CT 242 K/cmm      MPV 9.3 fL      Neutrophils % 76.8 %      Lymphocytes 14.8 %      Monocytes 7.4 %      Eosinophils % 0.8 %      Basophils % 0.1 %      Neutrophils Absolute 7.7 K/cmm      Lymphocytes Absolute 1.5 K/cmm      Monocytes Absolute 0.7 K/cmm      Eosinophils Absolute 0.1 K/cmm      Basophils Absolute 0.0 K/cmm     Collect Blood [[id number]] Collected: 11/21/21 0845    Specimen: Other Updated: 11/21/21 0856     Collect Blood Label Notification            RADIOLOGIC STUDIES    All images have been personally viewed by me    No results found.     ORDERS PLACED THIS VISIT     Orders  Orders Placed This  Encounter   Procedures    Urinalysis w Microscopic and Culture if Indicated    Comprehensive metabolic panel    CBC and differential    Collect Blood    Saline lock IV       Medications  Medications - No data to display           Allergies & Medications:     Pt is allergic to influenza virus vaccine.    Current/Home Medications    AMLODIPINE (NORVASC) 10 MG TABLET    Take 1 tablet (10 mg) by mouth daily    ASPIRIN EC 81 MG EC TABLET    Take 1  tablet (81 mg total) by mouth every evening.    ATORVASTATIN (LIPITOR) 40 MG TABLET    TAKE 1 TABLET BY MOUTH ONCE DAILY    AZELASTINE (ASTELIN) 0.1 % NASAL SPRAY    1 spray by Nasal route 2 (two) times daily Use in each nostril as directed    CONTINUOUS BLOOD GLUC RECEIVER (FREESTYLE LIBRE 2 READER) DEVICE    Use as directed to monitor blood sugar;  E11.65    CONTINUOUS BLOOD GLUC SENSOR (FREESTYLE LIBRE 2 SENSOR) MISC    Use as directed to monitor blood sugar; change every 14 days;  E11.65    ERGOCALCIFEROL (ERGOCALCIFEROL) 1.25 MG (50000 UT) CAPSULE    One capsule weekly x4 weeks, then one capsule every other week    FUROSEMIDE (LASIX) 40 MG TABLET    TAKE 1 TABLET BY MOUTH ONCE DAILY    GABAPENTIN (NEURONTIN) 300 MG CAPSULE    Take 1 capsule (300 mg) by mouth 3 (three) times daily    GLUCOSE BLOOD TEST STRIP    1 each by Other route 4 (four) times daily Use as instructed    HYDRALAZINE (APRESOLINE) 50 MG TABLET    Take 1 tablet (50 mg) by mouth daily    INSULIN ASPART (NOVOLOG FLEXPEN) 100 UNIT/ML INJECTION PEN    6 units breakfast and lunch, 10 units supper + correction up to 50 units per day    INSULIN GLARGINE (LANTUS SOLOSTAR) 100 UNIT/ML INJECTION PEN    Inject 68 Units into the skin daily    INSULIN PEN NEEDLE 31G X 6 MM MISC    Use Use up to 7 times a day as directed    LIRAGLUTIDE (VICTOZA) 18 MG/3ML INJECTION    INJECT 1.8MG  SUBCUTANEOUSLY ONCE DAILY    METOPROLOL TARTRATE (LOPRESSOR) 50 MG TABLET    Take 1 tablet (50 mg) by mouth 2 (two) times daily    MULTIPLE  VITAMINS-MINERALS (MULTI FOR HIM 50+) TAB    Take by mouth daily    SODIUM BICARBONATE 650 MG TABLET    Take 1 tablet (650 mg) by mouth 2 (two) times daily    VITAMIN D (CHOLECALCIFEROL) 25 MCG (1000 UT) TABLET    Take 2 tablets (2,000 Units) by mouth 2 (two) times daily           Past History:     Medical:   Past Medical History:   Diagnosis Date    Abnormal vision     Arthritis     Closed displaced transverse fracture of left patella 3/87/5643    Complication of anesthesia     last time he was kicking his legs had to wake him up    Diabetes mellitus     Diabetic foot ulcer 03/10/2015    Diabetic ulcer of left foot 12/26/2014    Disc     End-stage renal disease 2021    stage 5 (2023)    Gastroesophageal reflux disease     Hyperlipidemia     Hypertension     Left patella fracture 09/10/2020    Low back pain     Neuropathy     S/P ORIF (open reduction internal fixation) fracture 09/21/2020    Patella left knee on 09/21/20    Seasonal allergic rhinitis     Sleep apnea     not tested    Type 2 diabetes mellitus, controlled        Surgical:   Past Surgical History:   Procedure Laterality Date  ARTHRODESIS, TOE Left 05/04/2015    Procedure: ARTHRODESIS, TOE;  Surgeon: Janora Norlander, DPM;  Location: [facility];  Service: Podiatry;  Laterality: Left;  1st MPJ FUSION    DEBRIDEMENT & IRRIGATION, LOWER EXTREMITY Left 03/12/2015    Procedure: [place], LOWER EXTREMITY;  Surgeon: Janora Norlander, DPM;  Location: [facility];  Service: Podiatry;  Laterality: Left;  I&D LEFT FOOT FOR BONE INFECTION    DEBRIDEMENT & IRRIGATION, LOWER EXTREMITY Left 03/07/2016    Procedure: DEBRIDEMENT & IRRIGATION, LEFT FOOT 2ND TOE, EXCISION OF 2ND METATARSEL;  Surgeon: Janora Norlander, DPM;  Location: [facility];  Service: Podiatry;  Laterality: Left;  I&D Left Foot    FOOT SURGERY Right 05/2020    [facility]; 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; r/t Charcot    FORMATION, UPPER EXTREMITY, A-V FISTULA  Left 09/06/2021    Procedure: LEFT FORMATION, A-V FISTULA, UPPER EXTREMITY;  Surgeon: Franklyn Lor, MD;  Location: [facility];  Service: General;  Laterality: Left;    HAND SURGERY      JOINT REPLACEMENT Left     left big toe; cadaver joint    ORIF, PATELLA Left 09/21/2020    Procedure: ORIF, LEFT KNEE PATELLA;  Surgeon: Eugenie Filler, MD;  Location: [facility];  Service: Orthopedics;  Laterality: Left;    ORIF, PATELLA Left 12/24/2020    Procedure: LEFT PATELLA HARDWARE REVISION / ORIF;  Surgeon: Marlin Canary, MD;  Location: [facility];  Service: Orthopedics;  Laterality: Left;    SESAMOIDECTOMY Left 05/24/2019    Procedure: SESAMOIDECTOMY;  Surgeon: [name], DPM;  Location: [facility];  Service: Podiatry;  Laterality: Left;       Family:   Family History   Problem Relation Age of Onset    Diabetes Mother     Lung cancer Mother     Cancer Mother         lung    Depression Sister     COPD Sister     Cancer Sister         breast    Hypertension Brother        Social:  reports that he quit smoking about 4 years ago. His smoking use included cigarettes. He smoked an average of .25 packs per day. He has never used smokeless tobacco. He reports that he does not drink alcohol and does not use drugs.        [place]      Note:  This chart was generated by an EMR and speech recognition software and may contain errors, including typographical, or omissions not intended by the user. If there are questions or concerns about the content of this note or information contained within the body of this dictation they should be addressed directly with the author for clarification.

## 2021-11-21 NOTE — ED Triage Notes (Signed)
Pt states that he has had swelling in his legs for months. Pt has a weeping wound on left leg. Pt states that he has kidney failure but is not on dialysis, yet. Pt states that he feels like he can't breathe.

## 2021-11-22 ENCOUNTER — Encounter (INDEPENDENT_AMBULATORY_CARE_PROVIDER_SITE_OTHER): Payer: Self-pay

## 2021-11-22 ENCOUNTER — Inpatient Hospital Stay: Payer: PRIVATE HEALTH INSURANCE

## 2021-11-22 LAB — HEPATIC FUNCTION PANEL
ALT: 16 U/L (ref 0–55)
AST (SGOT): 13 U/L (ref 10–42)
Albumin/Globulin Ratio: 0.87 Ratio (ref 0.80–2.00)
Albumin: 2.7 gm/dL — ABNORMAL LOW (ref 3.5–5.0)
Alkaline Phosphatase: 148 U/L — ABNORMAL HIGH (ref 40–145)
Bilirubin Direct: 0.2 mg/dL (ref 0.0–0.3)
Bilirubin, Total: 0.3 mg/dL (ref 0.1–1.2)
Globulin: 3.1 gm/dL (ref 2.0–4.0)
Protein, Total: 5.8 gm/dL — ABNORMAL LOW (ref 6.0–8.3)

## 2021-11-22 LAB — CBC AND DIFFERENTIAL
Basophils %: 0.1 % (ref 0.0–3.0)
Basophils Absolute: 0 10*3/uL (ref 0.0–0.3)
Eosinophils %: 1 % (ref 0.0–7.0)
Eosinophils Absolute: 0.1 10*3/uL (ref 0.0–0.8)
Hematocrit: 29.1 % — ABNORMAL LOW (ref 39.0–52.5)
Hemoglobin: 9.4 gm/dL — ABNORMAL LOW (ref 13.0–17.5)
Lymphocytes Absolute: 1.4 10*3/uL (ref 0.6–5.1)
Lymphocytes: 15.5 % (ref 15.0–46.0)
MCH: 30 pg (ref 28–35)
MCHC: 32 gm/dL (ref 32–36)
MCV: 92 fL (ref 80–100)
MPV: 9.5 fL (ref 6.0–10.0)
Monocytes Absolute: 0.7 10*3/uL (ref 0.1–1.7)
Monocytes: 7.9 % (ref 3.0–15.0)
Neutrophils %: 75.5 % (ref 42.0–78.0)
Neutrophils Absolute: 6.9 10*3/uL (ref 1.7–8.6)
PLT CT: 228 10*3/uL (ref 130–440)
RBC: 3.18 10*6/uL — ABNORMAL LOW (ref 4.00–5.70)
RDW: 13.6 % (ref 11.0–14.0)
WBC: 9.1 10*3/uL (ref 4.0–11.0)

## 2021-11-22 LAB — COMPREHENSIVE METABOLIC PANEL
ALT: 16 U/L (ref 0–55)
AST (SGOT): 13 U/L (ref 10–42)
Albumin/Globulin Ratio: 0.87 Ratio (ref 0.80–2.00)
Albumin: 2.7 gm/dL — ABNORMAL LOW (ref 3.5–5.0)
Alkaline Phosphatase: 148 U/L — ABNORMAL HIGH (ref 40–145)
Anion Gap: 12.2 mMol/L (ref 7.0–18.0)
BUN / Creatinine Ratio: 14.6 Ratio (ref 10.0–30.0)
BUN: 59 mg/dL — ABNORMAL HIGH (ref 7–22)
Bilirubin, Total: 0.3 mg/dL (ref 0.1–1.2)
CO2: 23 mMol/L (ref 20–30)
Calcium: 8.7 mg/dL (ref 8.5–10.5)
Chloride: 111 mMol/L — ABNORMAL HIGH (ref 98–110)
Creatinine: 4.04 mg/dL — ABNORMAL HIGH (ref 0.80–1.30)
EGFR: 16 mL/min/{1.73_m2} — ABNORMAL LOW (ref 60–150)
Globulin: 3.1 gm/dL (ref 2.0–4.0)
Glucose: 88 mg/dL (ref 71–99)
Osmolality Calculated: 299 mOsm/kg (ref 275–300)
Potassium: 4.2 mMol/L (ref 3.5–5.3)
Protein, Total: 5.8 gm/dL — ABNORMAL LOW (ref 6.0–8.3)
Sodium: 142 mMol/L (ref 136–147)

## 2021-11-22 LAB — VH DEXTROSE STICK GLUCOSE
Glucose POCT: 98 mg/dL (ref 71–99)
Glucose POCT: 98 mg/dL (ref 71–99)
Glucose POCT: 99 mg/dL (ref 71–99)
Glucose POCT: 88 mg/dL (ref 71–99)
Glucose POCT: 89 mg/dL (ref 71–99)

## 2021-11-22 MED ORDER — AMLODIPINE BESYLATE 5 MG PO TABS
5.0000 mg | ORAL_TABLET | Freq: Every day | ORAL | Status: DC
Start: 2021-11-23 — End: 2021-11-24
  Administered 2021-11-23 – 2021-11-24 (×2): 5 mg via ORAL
  Filled 2021-11-22 (×2): qty 1

## 2021-11-22 MED ORDER — LANTUS SOLOSTAR 100 UNIT/ML SC SOPN
18.0000 [IU] | PEN_INJECTOR | Freq: Every evening | SUBCUTANEOUS | Status: DC
Start: 2021-11-23 — End: 2021-11-23

## 2021-11-22 MED ORDER — VH PERFLUTREN LIPID MICROSPHERE 6.52 MG/ML IV SUSP
INTRAVENOUS | Status: AC
Start: 2021-11-22 — End: ?
  Filled 2021-11-22: qty 2

## 2021-11-22 MED ORDER — GABAPENTIN 100 MG PO CAPS
200.0000 mg | ORAL_CAPSULE | Freq: Two times a day (BID) | ORAL | Status: DC
Start: 2021-11-22 — End: 2021-11-23
  Administered 2021-11-22 – 2021-11-23 (×3): 200 mg via ORAL
  Filled 2021-11-22 (×3): qty 2

## 2021-11-22 NOTE — Progress Notes (Signed)
Renal Progress Note    Follow up for:  CKD, stage 5    Subjective:     [name] resting comfortably in bed  No complaints  Mild orthopnea reported overnight but improved      Objective:     Vitals: BP 116/60   Pulse 65   Temp 98.2 F (36.8 C) (Temporal)   Resp 20   Ht 1.702 m (5\' 7" )   Wt 122.6 kg (270 lb 4.5 oz)   SpO2 98%   BMI 42.33 kg/m       I/O:   Intake/Output Summary (Last 24 hours) at 11/22/2021 1140  Last data filed at 11/22/2021 0856  Gross per 24 hour   Intake 460 ml   Output 2850 ml   Net -2390 ml       Weights:  Wt Readings from Last 1 Encounters:   11/22/21 0405 122.6 kg (270 lb 4.5 oz)   11/21/21 0832 122.5 kg (270 lb)       Physical Exam:  General: No distress.  Lungs: CTA  Heart: RRR, no murmur, gallop, rub, or heave.  Abdomen: BS present, soft, moderate distention  Extremities: 2+ edema.  Access: N/A        Labs:  Labs reviewed.  Radiology results reviewed.  Pertinent findings below.  Recent Labs     11/22/21  0515 11/21/21  0845   WBC 9.1 10.0   Hemoglobin 9.4* 10.2*   Hematocrit 29.1* 32.7*   PLT CT 228 242     No results for input(s): "[name]", "INR", "APTT" in the last 72 hours.  Recent Labs     11/21/21  0841   Troponin I 0.02     Recent Labs     11/21/21  0841   B-Natriuretic Peptide 820.7*                   No results found.   Recent Labs     11/22/21  0515 11/21/21  0845   Glucose 88 129*   Sodium 142 142   Potassium 4.2 4.3   Chloride 111* 109   CO2 23 21.4   BUN 59* 59*   Creatinine 4.04* 4.54*   EGFR 16* 14*   Calcium 8.7 9.2     No results for input(s): "MG", "PHOS" in the last 72 hours.  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   Baseline sCr ~ 4  Follows with Dr. Fernande Boyden for CKD management  Has completed ESKD education and voices preference  for CCPD or pre-emptive transplant  Has been referred to [facility] transplant  backup AVF placed 08/2021  Hypervolemia with bilateral pleural effusions secondary to advancing CKD and nephrotic proteinuria  Nephrotic proteinuria  UPCR 7-13  Small Bilateral pleural effusions secondary to nephrotic proteinuria and advanced CKD  Essential HTN exacerbated by hypervolemia  Type II DM  Secondary HPTH  Anemia of CKD, ESA naive to date      Plan:     Continue with lasix gtt to affect net volume loss of ~ 2-2.5L/day as azotemia and BP will allow  Lower dose and alter holding parameters for anti-hypertensive agents to allow for more aggressive diuresis  Strict I/Os, daily weights, daily chemistries  Low sodium and fluid restricted diet as ordered     Will follow daily to assess for diuretic response and depending upon trends in sCr/azotemia we shall determine if he would need to start dialysis.     No acute dialysis needs at this time  Will follow closely with you      Orders and Medications reviewed in Epic.    Blanchard Mane, MD, Pager [phone]  11:40 AM  11/22/2021  [facility]  Dr. Josephina Shih, Dr. Fernande Boyden, Dr. Oswaldo Milian, Dr. Recardo Evangelist, Artis Delay NP, Maryelizabeth Kaufmann NP.   Office: [phone]

## 2021-11-22 NOTE — Progress Notes (Signed)
Per the [facility] Automatic Renal Dosing Protocol:    The following medication was adjusted based on the patient's creatinine clearance:    Medication: gabapentin  Indication: neuropathic pain  SCr: 4 mg/dL  CrCl: 25 ml/min  Previous Dosing Regimen: 300 mg PO three times daily  New Dosing Regimen: 200 mg PO twice a day      [id number]  [facility] Pharmacy Department

## 2021-11-22 NOTE — Progress Notes (Signed)
Nutrition Note:      Height:  1.702 m (5\' 7" )  Weight:  122.6 kg (270 lb 4.5 oz)  BMI:  Body mass index is 42.33 kg/m.    Patient meets criteria for Morbid Obesity with BMI > 40.  Therapeutic inpatient diet is calorie controlled.  Education and resources for [employer] management/healthy eating provided.    Lujean Amel, RDN  11/22/2021 10:19 AM

## 2021-11-22 NOTE — Progress Notes (Addendum)
Medicine Progress Note   [facility]   Patient Name: [name], [name] LOS: 1 days   Attending Physician: Ruthell Rummage, MD PCP: Rhunette Croft, MD      Hospital Course:                                                            [name] is a 56 y.o. 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  Acute Pulmonary edema  Hypervolemia  Echo with EF 50-55%  Maintained on bumex drip appeared to be diuresing well  Low sodium diet  Continue monitoring renal function  Intake and output and daily weight    Anemia of renal and Chronic disease  Hemoglobin trending down   No overt GI bleed    Hypertension  Uncontrolled  Continue norvasc/metoprolol  Lasix infusion    OSA  Continue supplemental O2    Type II DM with peripheral neuropathy  Further decrease dose of basal insulin   Continue SSI    Hyperlipidemia  Continue statin    GERD    Obesity BMI 42  Complicates aspects of carae  Lifestyle modification      Disposition:   Continue lasix drip, close monitoring of output and daily weight  Monitor renal function  DVT PPX: Medication VTE Prophylaxis Orders: heparin (porcine) (Heparin Sodium (Porcine)) injection 5,000 Units  Mechanical VTE Prophylaxis Orders: Reason for no mechanical VTE prophylaxis - Treatment not indicated  Code:  Full Code       Subjective     Feels better, denies chest pain or shortness of breath           Objective   Physical Exam:       Vitals: T:98.2 F (36.8 C) (Temporal), BP:116/51, HR:71, RR:20, SaO2:98%         General: Patient is awake. In no acute distress.  HEENT: No conjunctival drainage, vision is intact, anicteric sclera.  Neck: Supple, no thyromegaly.  Chest: decreased BS bilaterally. No rhonchi, no wheezing. No use of accessory muscles.  CVS: Normal rate and  regular rhythm no murmurs, without JVD, no pitting edema, pulses palpable.  Abdomen: Soft, non-tender, no guarding or rigidity, with normal bowel sounds.  Extremities: +3 edema BL LE and no gross deformity.  Skin: Warm, dry, no rash and no worrisome lesions.  NEURO: No motor or sensory deficits.  Psychiatric: Alert, interactive, appropriate, normal affect.        07/30/2021     4:29 PM 08/23/2021     3:29 PM 09/06/2021     6:13 AM 09/13/2021    10:36 AM 11/02/2021    10:01 AM 11/21/2021     8:32 AM 11/22/2021     4:05 AM   Weight Monitoring   Height 167.6 cm 167.6 cm 167.6 cm  167.6 cm 170.2 cm    Height Method  Stated Stated   Stated    Weight 122.925 kg 121.882 kg 120.8 kg 118.389 kg 122.471 kg 122.471 kg 122.6 kg   Weight Method  Actual Actual   Actual Standing Scale  BMI (calculated) 43.8 kg/m2 43.5 kg/m2 43.1 kg/m2  43.7 kg/m2 42.4 kg/m2            Intake/Output Summary (Last 24 hours) at 11/22/2021 4388  Last data filed at 11/22/2021 0404  Gross per 24 hour   Intake 220 ml   Output 2050 ml   Net -1830 ml     Body mass index is 42.33 kg/m.     Meds:     Current Facility-Administered Medications   Medication Dose Route Frequency    amLODIPine  10 mg Oral Daily    aspirin EC  81 mg Oral QAM    atorvastatin  40 mg Oral QHS    heparin (porcine)  5,000 Units Subcutaneous Q12H [place]    insulin glargine  20 Units Subcutaneous Q12H    insulin lispro (1 Unit Dial)  2-16 Units Subcutaneous TID AC    And    insulin lispro (1 Unit Dial)  1-9 Units Subcutaneous QHS and 0300    insulin lispro (1 Unit Dial)  5 Units Subcutaneous TID AC    metoprolol tartrate  50 mg Oral BID    potassium chloride  10 mEq Oral Daily    sodium bicarbonate  650 mg Oral BID    sodium chloride (PF)  3 mL Intravenous Q12H SCH      furosemide (LASIX) 500 mg 50 mL infusion 10 mg/hr (11/21/21 1521)     PRN Meds: acetaminophen **OR** acetaminophen **OR** acetaminophen, dextrose, glucagon (rDNA), HYDROcodone-acetaminophen, melatonin, naloxone (NARCAN) injection  0.4 mg, ondansetron **OR** ondansetron, oxymetazoline.     LABS:     Estimated Creatinine Clearance: 25 mL/min (A) (based on SCr of 4.04 mg/dL (H)).  Recent Labs   Lab 11/22/21  0515 11/21/21  0845   WBC 9.1 10.0   RBC 3.18* 3.55*   Hemoglobin 9.4* 10.2*   Hematocrit 29.1* 32.7*   MCV 92 92   PLT CT 228 242         Recent Labs   Lab 11/21/21  0841   Troponin I 0.02     Lab Results   Component Value Date    HGBA1CPERCNT 6.4 11/21/2021     Recent Labs   Lab 11/22/21  0515 11/21/21  0845   Glucose 88 129*   Sodium 142 142   Potassium 4.2 4.3   Chloride 111* 109   CO2 23 21.4   BUN 59* 59*   Creatinine 4.04* 4.54*   EGFR 16* 14*   Calcium 8.7 9.2     Recent Labs   Lab 11/22/21  0515 11/21/21  0845   Albumin 2.7*  2.7* 3.0*   Protein, Total 5.8*  5.8* 6.7   Bilirubin, Total 0.3  0.3 0.3   Alkaline Phosphatase 148*  148* 161*   ALT 16  16 18    AST (SGOT) 13  13 16      Recent Labs   Lab 11/21/21  1116   Urine Specific Gravity 1.008   pH, Urine 6.0   Protein, UR >=500*   Glucose, UA 50*   Ketones UA Negative   Bilirubin, UA Negative   Blood, UA Negative   Nitrite, UA Negative   Urobilinogen, UA Normal   Leukocyte Esterase, UA Negative   WBC, UA 1   RBC, UA 1      Patient Lines/Drains/Airways Status       Active PICC Line / CVC Line / PIV Line / Drain / Airway / Intraosseous Line / Epidural Line / ART  Line / Line / Wound / Pressure Ulcer / NG/OG Tube       Name Placement date Placement time Site Days    Peripheral IV 11/21/21 20 G Right Antecubital 11/21/21  0846  Antecubital  less than 1    Peripheral IV 11/22/21 20 G Right;Posterior Forearm 11/22/21  0640  Forearm  less than 1                   XR Chest 2 Views    Result Date: 11/21/2021  IMPRESSION: Interstitial edema and bilateral effusions. Atelectasis versus alveolar edema in the lung bases. ReadingStation:[email or web address]    Home Health Needs:  There are no questions and answers to display.       Nutrition assessment done in collaboration with Registered Dietitians:      Time spent:      Ruthell Rummage, MD     11/22/21,8:22 AM   MRN: [id number]                                      CSN: [id number] DOB: [birth year]

## 2021-11-22 NOTE — Plan of Care (Addendum)
Patient:  [name], [name], 56 y.o. male    Admission DX: Shortness of breath [R06.02]  Generalized edema [R60.1]  Pulmonary edema [J81.1]  CKD (chronic kidney disease), stage V [N18.5]  Acute congestive heart failure, unspecified heart failure type [I50.9]    Events (last 24 hrs/overnight):        Nursing Shift Summary:      Pt alert and oriented times 4. VSS. Reported neuropathy pain managed with 1 time dose of gabapentin. Denies nausea, vomiting, SOB or chest pain. Normal sinus on monitor, lungs clear bilaterally. Voiding adequately, no bm this shift. Lasix gtt infusing per order. Call bell and table in reach, bed locked and low,      Vitals:      BP: 116/51 (11/22/2021  7:32 AM)  Heart Rate: 71 (11/22/2021  7:32 AM)  Temp: 98.2 F (36.8 C) (11/22/2021  7:32 AM)  Resp Rate: 20 (11/22/2021  7:32 AM)   Oxygen Needs:    SpO2: 98 % (11/22/2021  7:32 AM)  O2 Device: None (Room air) (11/22/2021  7:32 AM)      Last 3 Weights:    Recent Weights for the past 720 hrs (Last 3 readings):   Weight   11/22/21 0405 122.6 kg (270 lb 4.5 oz)   11/21/21 0832 122.5 kg (270 lb)       Tele:   Last order of [facility] TELEMETRY MONITORING was found on 11/21/2021 from Hospital Encounter on 11/21/2021         Cardiac Rhythm: Normal Sinus Rhythm     Drips:         Current Facility-Administered Medications   Medication Dose Last Admin    furosemide (LASIX) 500 mg 50 mL infusion  10 mg/hr 10 mg/hr at 11/21/21 1521      I/O: Last 24 hours:    UOP:  Foley/Drain LDA(s):       Intake/Output Summary (Last 24 hours) at 11/22/2021 0744  Last data filed at 11/22/2021 0404  Gross per 24 hour   Intake 220 ml   Output 2050 ml   Net -1830 ml        Drain Output:    No order of INSERT FOLEY CATHETER is found.     No order of EXTERNAL URINARY CATHETER is found.     Catheter necessity reviewed with physician?  []  Remove   []  Keep   []  N/A           Last BM: Last BM Date: 11/21/21       Unmeasured Output This Shift:    No data found.     VTE PPX:       Current  Facility-Administered Medications (Includes Only Anticoagulants)   Medication Dose Route Frequency Provider Last Rate Last Admin    heparin (porcine) (Heparin Sodium (Porcine)) injection 5,000 Units  5,000 Units Subcutaneous Q12H [facility] Wendee Copp, NP   5,000 Units at 11/21/21 2135       Skin Issues/Concerns:      Skin issues noted this shift:             Chem Sticks (Hyper/Hypo)       Glucose POCT   Date Value Ref Range Status   11/22/2021 98 71 - 99 mg/dL Final     Comment:     The above 1 analytes were performed by [facility] [phone])  [address] [address]         Mobility:  PMP Activity: Step 6 - Walks in Room (11/22/2021 12:00 AM)      Falls Risk:   Risk Category: Moderate      PT/OT (recommendations):      No data recorded    Care Act Designee to care for patient after discharge:      [place] Provider?: Yes (11/21/2021  3:00 PM)  Name of Care Act Designee: Kendrix Orman (11/21/2021  3:00 PM)  Phone Number of Care Act Designee: [phone] (11/21/2021  3:00 PM)      Care Partner(s) who will help during hospital stay:        Designated Patient Care Partner #1: Suyash Amory (11/21/2021  3:00 PM)      Patient Rounding, Team Members Present    []  Rounds Occurred   []  Patient    []  MD    []  CM    []  Primary Nurse    []  Charge Nurse    []  Pharmacist      ("In a few words, as a patient, do you have any goals or concerns you would like to share  with the team today?" )     Patient stated goals and/or concerns:          Action plan:                       Problem: Hemodynamic Status: Cardiac  Goal: Stable vital signs and fluid balance  Outcome: Progressing  Flowsheets (Taken 11/22/2021 0437)  Stable vital signs and fluid balance:   Monitor/assess vital signs and telemetry per unit protocol   Weigh on admission and record weight daily   Assess signs and symptoms associated with cardiac rhythm changes   Monitor intake/output per unit protocol and/or LIP order     Problem:  Ineffective Gas Exchange  Goal: Effective breathing pattern  Outcome: Progressing     Problem: Impaired Mobility  Goal: Mobility/Activity is maintained at optimal level for patient  Outcome: Progressing     Problem: Moderate/High Fall Risk Score >5  Goal: Patient will remain free of falls  Outcome: Progressing

## 2021-11-22 NOTE — Plan of Care (Signed)
Patient:  [name], [name], 56 y.o. male    Admission DX: Shortness of breath [R06.02]  Generalized edema [R60.1]  Pulmonary edema [J81.1]  CKD (chronic kidney disease), stage V [N18.5]  Acute congestive heart failure, unspecified heart failure type [I50.9]    Events (last 24 hrs/overnight):        Nursing Shift Summary:      0715 resumed care of patient. Safety check performed. White board updated. All items within reach. No needs at this time.     Vitals:      BP: 116/60 (11/22/2021  9:02 AM)  Heart Rate: 65 (11/22/2021  9:02 AM)  Temp: 98.2 F (36.8 C) (11/22/2021  7:32 AM)  Resp Rate: 20 (11/22/2021  7:32 AM)   Oxygen Needs:    SpO2: 98 % (11/22/2021  7:32 AM)  O2 Device: None (Room air) (11/22/2021  7:32 AM)      Last 3 Weights:    Recent Weights for the past 720 hrs (Last 3 readings):   Weight   11/22/21 0405 122.6 kg (270 lb 4.5 oz)   11/21/21 0832 122.5 kg (270 lb)       Tele:   Last order of VH TELEMETRY MONITORING was found on 11/21/2021 from Hospital Encounter on 11/21/2021         Cardiac Rhythm: Normal Sinus Rhythm     Drips:         Current Facility-Administered Medications   Medication Dose Last Admin    furosemide (LASIX) 500 mg 50 mL infusion  10 mg/hr 10 mg/hr at 11/21/21 1521      I/O: Last 24 hours:    UOP:  Foley/Drain LDA(s):       Intake/Output Summary (Last 24 hours) at 11/22/2021 0956  Last data filed at 11/22/2021 0856  Gross per 24 hour   Intake 220 ml   Output 2850 ml   Net -2630 ml        Drain Output:    No order of INSERT FOLEY CATHETER is found.     No order of EXTERNAL URINARY CATHETER is found.     Catheter necessity reviewed with physician?  []  Remove   []  Keep   []  N/A           Last BM: Last BM Date: 11/21/21       Unmeasured Output This Shift:    No data found.     VTE PPX:       Current Facility-Administered Medications (Includes Only Anticoagulants)   Medication Dose Route Frequency Provider Last Rate Last Admin    heparin (porcine) (Heparin Sodium (Porcine)) injection  5,000 Units  5,000 Units Subcutaneous Q12H [facility] Wendee Copp, NP   5,000 Units at 11/22/21 8032       Skin Issues/Concerns:      Skin issues noted this shift:             Chem Sticks (Hyper/Hypo)       Glucose POCT   Date Value Ref Range Status   11/22/2021 98 71 - 99 mg/dL Final     Comment:     The above 1 analytes were performed by [facility] [phone])  [address],[place] [address]         Mobility:        PMP Activity: Step 6 - Walks in Room (11/22/2021  8:00 AM)      Falls Risk:   Risk Category: Moderate      PT/OT (recommendations):  No data recorded    Care Act Designee to care for patient after discharge:      [place] Provider?: Yes (11/21/2021  3:00 PM)  Name of Care Act Designee: [name] (11/21/2021  3:00 PM)  Phone Number of Care Act Designee: [phone] (11/21/2021  3:00 PM)      Care Partner(s) who will help during hospital stay:        Designated Patient Care Partner #1: [name] (11/21/2021  3:00 PM)      Patient Rounding, Team Members Present    []  Rounds Occurred   []  Patient    []  MD    []  CM    []  Primary Nurse    []  Charge Nurse    []  Pharmacist      ("In a few words, as a patient, do you have any goals or concerns you would like to share  with the team today?" )     Patient stated goals and/or concerns:          Action plan:                       Problem: Hemodynamic Status: Cardiac  Goal: Stable vital signs and fluid balance  Outcome: Progressing     Problem: Ineffective Gas Exchange  Goal: Effective breathing pattern  Outcome: Progressing

## 2021-11-22 NOTE — UM Notes (Signed)
[place] Utilization Management Review Sheet    Facility :  [facility]    NAME: [name]      MR#: [id number]    ROOM: 41/418-A     Date of Birth: 1965-05-07    ADMIT DATE AND TIME: 11/21/2021  8:57 AM    ATTENDING PHYSICIAN: Ruthell Rummage, MD      PAYOR:Payor: [employer] / Plan: [employer] / Product Type: COMMERCIAL /     DATE OF REVIEW COMPLETION: 11/22/2021    DATE REVIEWED: 11/21/2021    Type of Bed: MEDICAL    Chief Complaint/Presentation: loss of breath and difficulty sleeping. Patient reports he has had swelling in his legs for months but now until recently it left leg draining.  Patient reports difficulty sleeping, cannot lay flat cannot lay in bed and now having difficulty in recliner.  Patient reports gasping for air.  Left lower leg draining clear liquid fluid. Patient with AV fistula to left upper arm, patient follows with nephrology Dr. Fernande Boyden, reports his plan is to start peritoneal dialysis but no PD catheter in place yet and AV fistula is for backup        Vitals: T:97.4 F (36.3 C) (Temporal)   BP:143/60, HR:88, RR:22, SaO2:94% RA    Abnl/Pertinent Labs/Radiology/Diagnostic Studies: HGB 10.2; HCT 32.7; GLU 167; CREAT 4.54; BUN 59; ALK PHOS 161; BNP 820.7;   UA: PROTEIN >500, GLU 50,     CXR:   Interstitial edema and bilateral effusions.   Atelectasis versus alveolar edema in the lung bases.     EKG: Sinus rhythm, rate of 74, short PR interval abnormal R progression late transition     ED treatment: LASIX 80MG  IV;     Op Note: NA    Pertinent Medical History:   Past Medical History:   Diagnosis Date    Abnormal vision     Arthritis     Closed displaced transverse fracture of left patella 47/82/9562    Complication of anesthesia     last time he was kicking his legs had to wake him up    Diabetic ulcer of left foot 12/26/2014    Disc     End-stage renal disease 2021    stage 5 (2023)    Gastroesophageal reflux disease     Hyperlipidemia     Hypertension     Left patella  fracture 09/10/2020    Low back pain     Neuropathy     S/P ORIF (open reduction internal fixation) fracture 09/21/2020    Patella left knee on 09/21/20    Seasonal allergic rhinitis     Sleep apnea     not tested    Type 2 diabetes mellitus, controlled        Physical Exam:   General:             No Acute Distress.  Fatigue, obesity  HEENT:              NCAT, MMM, EOMI, PERRL  Cardiovascular:  RRR. No Murmurs. Brisk cap refill  Respiratory:        CTAB. No rales, rhonchi or retractions.  Orthopnea, dyspnea  Gastrointestinal: nt, nd. Normoactive BS  Integument:        Skin intact, no bruises.  The media  Extremities:        Expected degrees of motion in all 4 extremities.  biLateral lower and upper leg pitting edema  Neurologic:  No focal neurologic abnormalities or gross focal deficit  Psychiatric:         AAOx3, cooperative.     MD Consults/Assessments & Plans:   #Acute pulmonary edema  AKI on CKD 5  Persistent proteinuria  Avoid nephrotoxins  Discussed with nephrology, will see in consultation  Lasix drip ordered  Strict I&O monitoring  Monitor daily weights  1.5 L fluid restrictions     #Anemia in CKD  H&H 10.2/32.7, stable     #Hypertension  BP 143/60  Hold metoprolol and amlodipine due to diuresis and AKI     #Sleep apnea  No CPAP use  RCAT, continue supplemental O2 as needed     #Diabetes mellitus  Continue Lantus, glucose 128, dose decreased while inpatient  Continue consistent carb diet  Accu-Cheks and sliding scale insulin as needed     Neuropathy  Hold gabapentin at this time due to AKI     #Hyperlipidemia  Continue statin     #GERD  Continue reflux medications     Obesity BMI 66.06  Complicates all aspects of care     DVT PPx: heparin subcu injections  Dispo: Pending Medical Progress      NEPHROLOGY:   CKD stage G5/A3  Hypervolemia secondary to nephrotic syndrome  The patient has previously completed ESKD education and has voiced a preference for CCPD or preemptive transplant in the event of  ESKD.  He has been referred to [facility] transplant but has not yet had his first visit there.  He has an AVF placed by Dr. Linzie Collin in July    Agree with starting lasix gtt to affect diuresis  Strict I/Os, daily weights, daily chemistries  Low sodium and fluid restricted diet as ordered   Will follow daily to assess for diuretic response and depending upon trends in sCr/azotemia we shall determine if he would need to start dialysis.   No acute dialysis needs at this time       Scheduled Meds:  Current Facility-Administered Medications   Medication Dose Route Frequency    amLODIPine  10 mg Oral Daily    aspirin EC  81 mg Oral QAM    atorvastatin  40 mg Oral QHS    gabapentin  200 mg Oral BID    heparin (porcine)  5,000 Units Subcutaneous Q12H [place]    insulin glargine  20 Units Subcutaneous Q12H    insulin lispro (1 Unit Dial)  2-16 Units Subcutaneous TID AC    And    insulin lispro (1 Unit Dial)  1-9 Units Subcutaneous QHS and 0300    insulin lispro (1 Unit Dial)  5 Units Subcutaneous TID AC    metoprolol tartrate  50 mg Oral BID    potassium chloride  10 mEq Oral Daily    sodium bicarbonate  650 mg Oral BID    sodium chloride (PF)  3 mL Intravenous Q12H [place]     Continuous Infusions:   furosemide (LASIX) 500 mg 50 mL infusion 10 mg/hr (11/21/21 1521)     PRN Meds:GABAPENTIN 300MG  PO;      Orders: VS Q8H, TELE, DAILY LABS, NEPHROLOGY CONSULT, ECHOCARDIOGRAM/DOPPLER, CC/RENAL DIET, GLYCEMIC CONTROL, MOBILITY PROTOCOL, STRICT I/O, DAILY WT, BLADDER SCAN/STRAIGHT CATH Q6H, FALL PRECAUTIONS,     PT/OT/SLP/CM Assessments or Notes:  PENDING    MCG Criteria: M-326

## 2021-11-23 LAB — ECG 12-LEAD
P Wave Axis: 65 deg
Patient Age: 56 years
Q-T Interval(Corrected): 446 ms
Q-T Interval: 402 ms
QRS Axis: 13 deg
QRS Duration: 103 ms
T Axis: 54 a
Ventricular Rate: 74 min

## 2021-11-23 LAB — BASIC METABOLIC PANEL
Anion Gap: 16.7 mMol/L (ref 7.0–18.0)
BUN / Creatinine Ratio: 14.6 Ratio (ref 10.0–30.0)
BUN: 65 mg/dL — ABNORMAL HIGH (ref 7–22)
CO2: 24 mMol/L (ref 20–30)
Calcium: 8.6 mg/dL (ref 8.5–10.5)
Chloride: 105 mMol/L (ref 98–110)
Creatinine: 4.45 mg/dL — ABNORMAL HIGH (ref 0.80–1.30)
EGFR: 15 mL/min/{1.73_m2} — ABNORMAL LOW (ref 60–150)
Glucose: 76 mg/dL (ref 71–99)
Osmolality Calculated: 301 mOsm/kg — ABNORMAL HIGH (ref 275–300)
Potassium: 3.7 mMol/L (ref 3.5–5.3)
Sodium: 142 mMol/L (ref 136–147)

## 2021-11-23 LAB — VH DEXTROSE STICK GLUCOSE
Glucose POCT: 100 mg/dL — ABNORMAL HIGH (ref 71–99)
Glucose POCT: 86 mg/dL (ref 71–99)
Glucose POCT: 148 mg/dL — ABNORMAL HIGH (ref 71–99)
Glucose POCT: 103 mg/dL — ABNORMAL HIGH (ref 71–99)
Glucose POCT: 153 mg/dL — ABNORMAL HIGH (ref 71–99)

## 2021-11-23 LAB — HEMOGLOBIN AND HEMATOCRIT, BLOOD
Hematocrit: 30.4 % — ABNORMAL LOW (ref 39.0–52.5)
Hemoglobin: 9.7 gm/dL — ABNORMAL LOW (ref 13.0–17.5)

## 2021-11-23 MED ORDER — GABAPENTIN 100 MG PO CAPS
200.0000 mg | ORAL_CAPSULE | Freq: Every day | ORAL | Status: DC
Start: 2021-11-24 — End: 2021-11-24
  Administered 2021-11-24: 200 mg via ORAL
  Filled 2021-11-23: qty 2

## 2021-11-23 NOTE — Plan of Care (Signed)
Problem: Hemodynamic Status: Cardiac  Goal: Stable vital signs and fluid balance  Outcome: Progressing     Problem: Ineffective Gas Exchange  Goal: Effective breathing pattern  Outcome: Progressing     Problem: Impaired Mobility  Goal: Mobility/Activity is maintained at optimal level for patient  Outcome: Progressing     Problem: Moderate/High Fall Risk Score >5  Goal: Patient will remain free of falls  Outcome: Progressing

## 2021-11-23 NOTE — UM Notes (Signed)
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  Acute Pulmonary edema  Hypervolemia  Echo with EF 50-55%  Maintained on bumex drip appeared to be diuresing well  Low sodium diet  Continue monitoring renal function  Intake and output and daily weight     Anemia of renal and Chronic disease  Hemoglobin trending down   No overt GI bleed     Hypertension  Uncontrolled  Continue norvasc/metoprolol  Lasix infusion     OSA  Continue supplemental O2     Type II DM with peripheral neuropathy  Further decrease dose of basal insulin   Continue SSI     Hyperlipidemia  Continue statin     GERD     Obesity BMI 42  Complicates aspects of carae  Lifestyle modification      Disposition:   Continue lasix drip, close monitoring of output and daily weight  Monitor renal function      NEPHROLOGY:   Continue with lasix gtt to affect net volume loss of ~ 2-2.5L/day as azotemia and BP will allow  Lower dose and alter holding parameters for anti-hypertensive agents to allow for more aggressive diuresis  Strict I/Os, daily weights, daily chemistries  Low sodium and fluid restricted diet as ordered   Will follow daily to assess for diuretic response and depending upon trends in sCr/azotemia we shall determine if he would need to start dialysis.   No acute dialysis needs at this time      Vitals:  T:98.2 F (36.8 C) (Temporal), BP:116/51, HR:71, RR:20, SaO2:98%     Abnl/Pertinent Labs/Radiology/Diagnostic Studies: HGB 9.4; HCT 29.1; CL 111; CREAT 4.04; ALB 2.7; ALK PHOS 148    ECHO:   1: The left ventricle is mildly dilated, there is borderline concentric left ventricular hypertrophy. The left  ventricular ejection fraction is low normal, estimated at 50-55%.    2: The left  atrium is mildly enlarged.   3: No significant valvular abnormalities.   4: Compared to prior study from 2017, the left atrium is mildly dilated,    Pertinent Physical Exam Findings:   Chest: decreased BS bilaterally   Extremities: +3 edema BL LE     Medication Dose Route Frequency    amLODIPine  10 mg Oral Daily    aspirin EC  81 mg Oral QAM    atorvastatin  40 mg Oral QHS    heparin (porcine)  5,000 Units Subcutaneous Q12H [place]    insulin glargine  20 Units Subcutaneous Q12H    insulin lispro (1 Unit Dial)  2-16 Units Subcutaneous TID AC     And    insulin lispro (1 Unit Dial)  1-9 Units Subcutaneous QHS and 0300    insulin lispro (1 Unit Dial)  5 Units Subcutaneous TID AC    metoprolol tartrate  50 mg Oral BID    potassium chloride  10 mEq Oral Daily    sodium bicarbonate  650 mg Oral BID    sodium chloride (PF)  3 mL Intravenous Q12H SCH       furosemide (LASIX) 500 mg 50 mL infusion 10 mg/hr (11/21/21 1521)  Orders: VS Q8H, TELE, DAILY LABS, CC/RENAL DIET, 1.5L FLUID RESTRICTION, GLYCEMIC CONTROL, MOBILITY PROTOCOL, STRICT I/O, DAILY WT, BLADDER SCAN/STRAIGHT CATH Q6H, FALL PRECAUTIONS,     PT/OT/SLP/CM Assessments or Notes: PENDING

## 2021-11-23 NOTE — Progress Notes (Signed)
Patient:  [name], [name], 56 y.o. 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 Whiteboard updated and call bell within reach.     Vitals:      BP: 144/62 (11/23/2021  8:54 AM)  Heart Rate: 83 (11/23/2021  8:54 AM)  Temp: 97.3 F (36.3 C) (11/23/2021  7:38 AM)  Resp Rate: 16 (11/23/2021  7:38 AM)   Oxygen Needs:    SpO2: 98 % (11/23/2021  7:38 AM)  O2 Device: None (Room air) (11/23/2021  5:49 AM)      Last 3 Weights:    Recent Weights for the past 720 hrs (Last 3 readings):   Weight   11/23/21 0600 116.2 kg (256 lb 2.8 oz)   11/22/21 0405 122.6 kg (270 lb 4.5 oz)   11/21/21 0832 122.5 kg (270 lb)       Tele:   Last order of VH TELEMETRY MONITORING was found on 11/21/2021 from Hospital Encounter on 11/21/2021         Cardiac Rhythm: Normal Sinus Rhythm     Drips:         Current Facility-Administered Medications   Medication Dose Last Admin    furosemide (LASIX) 500 mg 50 mL infusion  20 mg/hr 20 mg/hr at 11/23/21 0906      I/O: Last 24 hours:    UOP:  Foley/Drain LDA(s):       Intake/Output Summary (Last 24 hours) at 11/23/2021 1031  Last data filed at 11/23/2021 0600  Gross per 24 hour   Intake 440 ml   Output 5425 ml   Net -4985 ml        Drain Output:    No order of INSERT FOLEY CATHETER is found.     No order of EXTERNAL URINARY CATHETER is found.     Catheter necessity reviewed with physician?  []  Remove   []  Keep   []  N/A           Last BM: Last BM Date: 11/21/21       Unmeasured Output This Shift:    No data found.     VTE PPX:       Current Facility-Administered Medications (Includes Only Anticoagulants)   Medication Dose Route Frequency Provider Last Rate Last Admin    heparin (porcine) (Heparin Sodium (Porcine)) injection  5,000 Units  5,000 Units Subcutaneous Q12H [facility] Wendee Copp, NP   5,000 Units at 11/23/21 8185       Skin Issues/Concerns:      Skin issues noted this shift:             Chem Sticks (Hyper/Hypo)       Glucose POCT   Date Value Ref Range Status   11/23/2021 86 71 - 99 mg/dL Final     Comment:     The above 1 analytes were performed by [facility] [id number])  [address],[place] [address]         Mobility:        PMP Activity: Step 6 - Walks in Room (11/22/2021 10:00 PM)      Falls Risk:   Risk Category: Moderate      PT/OT (  recommendations):      No data recorded    Care Act Designee to care for patient after discharge:      [place] Provider?: Yes (11/21/2021  3:00 PM)  Name of Care Act Designee: Kevontae Burgoon (11/21/2021  3:00 PM)  Phone Number of Care Act Designee: [phone] (11/21/2021  3:00 PM)      Care Partner(s) who will help during hospital stay:        Designated Patient Care Partner #1: Daved Mcfann (11/21/2021  3:00 PM)      Patient Rounding, Team Members Present    [x]  Rounds Occurred   [x]  Patient    [x]  MD    [x]  CM    [x]  Primary Nurse    [x]  Charge Nurse    []  Pharmacist      ("In a few words, as a patient, do you have any goals or concerns you would like to share  with the team today?" )     Patient stated goals and/or concerns:          Action plan:

## 2021-11-23 NOTE — Progress Notes (Signed)
Renal Progress Note    Follow up for:  CKD, stage 5    Subjective:     Resting in bed  Reports that his swelling is much improved.  Denies any issues at this time  Questioning when he will be discharged.       Objective:     Vitals: BP 96/59   Pulse 76   Temp 97.5 F (36.4 C) (Temporal)   Resp 19   Ht 1.702 m (5\' 7" )   Wt 116.2 kg (256 lb 2.8 oz)   SpO2 98%   BMI 40.12 kg/m       I/O:   Intake/Output Summary (Last 24 hours) at 11/23/2021 1243  Last data filed at 11/23/2021 1228  Gross per 24 hour   Intake 570 ml   Output 6025 ml   Net -5455 ml         Weights:  Wt Readings from Last 1 Encounters:   11/23/21 0600 116.2 kg (256 lb 2.8 oz)   11/22/21 0405 122.6 kg (270 lb 4.5 oz)   11/21/21 0832 122.5 kg (270 lb)       Physical Exam:  General: No distress.  Lungs: CTA  Heart: RRR, no murmur, gallop, rub, or heave.  Abdomen: BS present, soft, moderate distention  Extremities: 2+ edema.  Access: N/A        Labs:  Labs reviewed.  Radiology results reviewed.  Pertinent findings below.  Recent Labs     11/23/21  0523 11/22/21  0515 11/21/21  0845   WBC  --  9.1 10.0   Hemoglobin 9.7* 9.4* 10.2*   Hematocrit 30.4* 29.1* 32.7*   PLT CT  --  228 242       No results for input(s): "PT", "INR", "APTT" in the last 72 hours.  Recent Labs     11/21/21  0841   Troponin I 0.02       Recent Labs     11/21/21  0841   B-Natriuretic Peptide 820.7*                     No results found.   Recent Labs     11/23/21  0523 11/22/21  0515 11/21/21  0845   Glucose 76 88 129*   Sodium 142 142 142   Potassium 3.7 4.2 4.3   Chloride 105 111* 109   CO2 24 23 21.4   BUN 65* 59* 59*   Creatinine 4.45* 4.04* 4.54*   EGFR 15* 16* 14*   Calcium 8.6 8.7 9.2       No results for input(s): "MG", "PHOS" in the last 72 hours.  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   Baseline sCr ~ 4  Follows with Dr. Fernande Boyden for CKD management  Has completed ESKD education and voices preference for CCPD or pre-emptive transplant  Has been referred to [facility] transplant  backup AVF placed 08/2021  Hypervolemia with bilateral pleural effusions secondary to advancing CKD and nephrotic proteinuria  Nephrotic proteinuria  UPCR 7-13  Small Bilateral pleural effusions secondary to nephrotic proteinuria and advanced CKD  Essential HTN exacerbated by hypervolemia  Type II DM  Secondary HPTH  Anemia of CKD, ESA naive to date      Plan:     Continue with lasix gtt to affect net volume loss of ~ 2-2.5L/day as azotemia and BP will allow  Lower dose and alter holding parameters for anti-hypertensive agents on 10/9 to allow for more aggressive diuresis  Strict I/Os, daily weights, daily chemistries  Low sodium and fluid restricted diet as ordered     Will follow daily to assess for diuretic response and depending upon trends in sCr/azotemia we shall determine if he would need to start dialysis.     No acute dialysis needs at this time  Will follow closely with you      Orders and Medications reviewed in Epic.    Artis Delay, NP, Pager [phone]  12:43 PM  11/23/2021  [facility]  Dr. Josephina Shih, Dr. Fernande Boyden, Dr. Oswaldo Milian, Dr. Recardo Evangelist, Artis Delay NP, Maryelizabeth Kaufmann NP.   Office: [phone]

## 2021-11-23 NOTE — Plan of Care (Signed)
Problem: Hemodynamic Status: Cardiac  Goal: Stable vital signs and fluid balance  Description: Interventions:  1. Monitor/assess vital signs and telemetry per unit protocol  2. Weigh on admission and record weight daily  3. Assess signs and symptoms associated with cardiac rhythm changes  4. Monitor intake/output per unit protocol and/or LIP order  5. Monitor lab values  6. Monitor for leg swelling/edema and report to LIP if abnormal  Outcome: Progressing  Flowsheets (Taken 11/22/2021 0437 by Markus Daft, RN)  Stable vital signs and fluid balance:   Monitor/assess vital signs and telemetry per unit protocol   Weigh on admission and record weight daily   Assess signs and symptoms associated with cardiac rhythm changes   Monitor intake/output per unit protocol and/or LIP order     Problem: Ineffective Gas Exchange  Goal: Effective breathing pattern  Description: Interventions:  1. Maintain O2 saturation level per LIP order  2. Maintain CO2 level per LIP order  3. Monitor end tidal CO2 level per LIP order  4. Teach/reinforce use of ordered respiratory interventions (ie. CPAP, BiPAP, Incentive Spirometer, Acapella)  5. Monitor for sleep apnea  6. Monitor for medication induced respiratory depression  Outcome: Progressing  Flowsheets (Taken 11/23/2021 0343)  Effective breathing pattern:   Maintain O2 saturation level per LIP order   Maintain CO2 level per LIP order   Monitor end tidal CO2 level per LIP order   Teach/reinforce use of ordered respiratory interventions (ie. CPAP, BiPAP, Incentive Spirometer, Acapella)   Monitor for sleep apnea   Monitor for medication induced respiratory depression     Problem: Impaired Mobility  Goal: Mobility/Activity is maintained at optimal level for patient  Description: Interventions:  1. Increase mobility as tolerated/progressive mobility  2. Encourage independent activity per ability  3. Maintain proper body alignment  4. Perform active/passive ROM  5. Plan activities to  conserve energy, plan rest periods  6. Reposition patient every 2 hours and as needed unless able to reposition self  7. Assess for changes in respiratory status, level of consciousness and/or development of fatigue  8. Consult/collaborate with Physical Therapy and/or Occupational Therapy  Outcome: Progressing  Flowsheets (Taken 11/23/2021 0343)  Mobility/activity is maintained at optimal level for patient:   Increase mobility as tolerated/progressive mobility   Encourage independent activity per ability   Plan activities to conserve energy, plan rest periods   Perform active/passive ROM   Maintain proper body alignment   Reposition patient every 2 hours and as needed unless able to reposition self   Assess for changes in respiratory status, level of consciousness and/or development of fatigue   Consult/collaborate with Physical Therapy and/or Occupational Therapy     Problem: Moderate/High Fall Risk Score >5  Description: Fall Risk Score > 5  Goal: Patient will remain free of falls  Outcome: Progressing  Flowsheets (Taken 11/23/2021 0343)  Moderate Risk (6-13):   MOD-Apply bed exit alarm if patient is confused   MOD-Consider a move closer to [place]   MOD-Place bedside commode and assistive devices out of sight when not in use   MOD-Utilize diversion activities   MOD-Request PT/OT consult order for patients with gait/mobility impairment   MOD-include family in multidisciplinary POC discussions   MOD-Use gait belt when appropriate   MOD-Perform dangle, stand, walk (DSW) prior to mobilization   MOD-Re-orient confused patients   MOD-Remain with patient during toileting   MOD-Floor mat at bedside (where available) if appropriate  Patient:  [name], [name], 56 y.o. 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 Vs are stable. No changes in medical condition.   Vitals:      BP: 112/55 (11/23/2021 12:49 AM)  Heart Rate: 76 (11/23/2021 12:49 AM)  Temp: 98.2 F (36.8 C) (11/23/2021 12:49 AM)  Resp Rate: 20 (11/23/2021 12:49 AM)   Oxygen Needs:    SpO2: 94 % (11/23/2021 12:49 AM)  O2 Device: None (Room air) (11/23/2021 12:49 AM)      Last 3 Weights:    Recent Weights for the past 720 hrs (Last 3 readings):   Weight   11/22/21 0405 122.6 kg (270 lb 4.5 oz)   11/21/21 0832 122.5 kg (270 lb)       Tele:   Last order of VH TELEMETRY MONITORING was found on 11/21/2021 from Hospital Encounter on 11/21/2021         Cardiac Rhythm: Normal Sinus Rhythm     Drips:         Current Facility-Administered Medications   Medication Dose Last Admin    furosemide (LASIX) 500 mg 50 mL infusion  20 mg/hr 20 mg/hr at 11/22/21 1847      I/O: Last 24 hours:    UOP:  Foley/Drain LDA(s):       Intake/Output Summary (Last 24 hours) at 11/23/2021 0344  Last data filed at 11/23/2021 0000  Gross per 24 hour   Intake 680 ml   Output 5175 ml   Net -4495 ml        Drain Output:    No order of INSERT FOLEY CATHETER is found.     No order of EXTERNAL URINARY CATHETER is found.     Catheter necessity reviewed with physician?  []  Remove   []  Keep   [x]  N/A           Last BM: Last BM Date: 11/21/21       Unmeasured Output This Shift:    No data found.     VTE PPX:       Current Facility-Administered Medications (Includes Only Anticoagulants)   Medication Dose Route Frequency Provider Last Rate Last Admin    heparin (porcine) (Heparin Sodium (Porcine)) injection 5,000 Units  5,000 Units Subcutaneous Q12H [facility] Wendee Copp, NP   5,000 Units at 11/22/21 2203       Skin Issues/Concerns:      Skin issues noted this shift:             Chem Sticks (Hyper/Hypo)       Glucose POCT   Date Value Ref Range Status   11/22/2021 89 71 - 99 mg/dL Final     Comment:     The above 1 analytes were  performed by [facility] [phone])  [address],[place] [address]         Mobility:        PMP Activity: Step 6 - Walks in Room (11/22/2021 10:00 PM)      Falls Risk:   Risk Category: Moderate      PT/OT (recommendations):      No data recorded    Care Act Designee to care  for patient after discharge:      [place] Provider?: Yes (11/21/2021  3:00 PM)  Name of Care Act Designee: Wilkie Zenon (11/21/2021  3:00 PM)  Phone Number of Care Act Designee: [phone] (11/21/2021  3:00 PM)      Care Partner(s) who will help during hospital stay:        Designated Patient Care Partner #1: Dravon Nott (11/21/2021  3:00 PM)      Patient Rounding, Team Members Present    []  Rounds Occurred   []  Patient    []  MD    []  CM    []  Primary Nurse    []  Charge Nurse    []  Pharmacist      ("In a few words, as a patient, do you have any goals or concerns you would like to share  with the team today?" )     Patient stated goals and/or concerns:          Action plan:

## 2021-11-23 NOTE — Progress Notes (Signed)
Per the [facility] Automatic Renal Dosing Protocol:    The following medication was adjusted based on the patient's creatinine clearance:    Medication: gabapentin  SCr: 4.45 mg/dL  CrCl: 22 ml/min  Previous Dosing Regimen: gabapentin 200 mg BID  New Dosing Regimen: gabapentin 200 mg daily      Verdie Mosher. [facility] PharmD, BCPS ext. [phone]  [facility] Department

## 2021-11-23 NOTE — Progress Notes (Signed)
Medicine Progress Note   [facility]   Patient Name: [name], [name] LOS: 2 days   Attending Physician: Ruthell Rummage, MD PCP: Rhunette Croft, MD      Hospital Course:                                                            [name] is a 56 y.o. male patient  with hyperlipidemia, hypertension, Type II DM with diabetic foot ulcer, ESRD, GERD, neuropathy, OSA presented with leg swelling, associated with weight gain. started on aggressive IV diuresis with Lasix 20 mg/h     Assessment and Plan:      AKI on CKD IV progressing to V  Acute Pulmonary edema  Hypervolemia  Echo with EF 50-55%  Maintained on lasix drip appeared to be diuresing well  Low sodium diet  Continue monitoring renal function  Intake and output and daily weight  Total output 6.2L  Per renal prep for CPPD    Anemia of renal and Chronic disease  Hemoglobin trending down   No overt GI bleed    Hypertension  Uncontrolled  Continue norvasc/metoprolol  Lasix infusion    OSA  Continue supplemental O2    Type II DM with peripheral neuropathy  Hold basal insulin continue SSI  [place] nutritional insulin    Hyperlipidemia  Continue statin    GERD    Obesity BMI 42  Complicates aspects of carae  Lifestyle modification      Disposition:   Continue lasix drip, close monitoring of output and daily weight  Monitor renal function  Follow renal recs  DVT PPX: Medication VTE Prophylaxis Orders: heparin (porcine) (Heparin Sodium (Porcine)) injection 5,000 Units  Mechanical VTE Prophylaxis Orders: Reason for no mechanical VTE prophylaxis - Treatment not indicated  Code:  Full Code       Subjective     Feels better, denies chest pain or shortness of breath           Objective   Physical Exam:       Vitals: T:97.9 F (36.6 C) (Temporal), BP:124/53, HR:77, RR:20, SaO2:92%         General: Patient is awake. In no acute distress.  HEENT: No conjunctival drainage, vision is intact, anicteric sclera.  Neck: Supple, no  thyromegaly.  Chest: decreased BS bilaterally. No rhonchi, no wheezing. No use of accessory muscles.  CVS: Normal rate and regular rhythm no murmurs, without JVD, no pitting edema, pulses palpable.  Abdomen: Soft, non-tender, no guarding or rigidity, with normal bowel sounds.  Extremities: +2 edema BL LE and no gross deformity.  Skin: Warm, dry, no rash and no worrisome lesions.  NEURO: No motor or sensory deficits.  Psychiatric: Alert, interactive, appropriate, normal affect.        08/23/2021     3:29 PM 09/06/2021     6:13 AM 09/13/2021    10:36 AM 11/02/2021    10:01 AM 11/21/2021     8:32 AM 11/22/2021     4:05 AM 11/23/2021     6:00 AM   Weight Monitoring   Height 167.6 cm 167.6 cm  167.6 cm 170.2 cm     Height Method Stated Stated   Stated     Weight 121.882 kg 120.8 kg 118.389  kg 122.471 kg 122.471 kg 122.6 kg 116.2 kg   Weight Method Actual Actual   Actual Standing Scale Bed Scale   BMI (calculated) 43.5 kg/m2 43.1 kg/m2  43.7 kg/m2 42.4 kg/m2             Intake/Output Summary (Last 24 hours) at 11/23/2021 1831  Last data filed at 11/23/2021 1813  Gross per 24 hour   Intake 570 ml   Output 5075 ml   Net -4505 ml       Body mass index is 40.12 kg/m.     Meds:     Current Facility-Administered Medications   Medication Dose Route Frequency    amLODIPine  5 mg Oral Daily    aspirin EC  81 mg Oral QAM    atorvastatin  40 mg Oral QHS    [START ON 11/24/2021] gabapentin  200 mg Oral Daily    heparin (porcine)  5,000 Units Subcutaneous Q12H [facility]    insulin lispro (1 Unit Dial)  2-16 Units Subcutaneous TID AC    And    insulin lispro (1 Unit Dial)  1-9 Units Subcutaneous QHS and 0300    insulin lispro (1 Unit Dial)  5 Units Subcutaneous TID AC    metoprolol tartrate  50 mg Oral BID    potassium chloride  10 mEq Oral Daily    sodium bicarbonate  650 mg Oral BID    sodium chloride (PF)  3 mL Intravenous Q12H [facility]      furosemide (LASIX) 500 mg 50 mL infusion 20 mg/hr (11/23/21 0906)     PRN Meds: acetaminophen **OR**  acetaminophen **OR** acetaminophen, dextrose, glucagon (rDNA), HYDROcodone-acetaminophen, melatonin, naloxone (NARCAN) injection 0.4 mg, ondansetron **OR** ondansetron, oxymetazoline.     LABS:     Estimated Creatinine Clearance: 22 mL/min (A) (based on SCr of 4.45 mg/dL (H)).  Recent Labs   Lab 11/23/21  0523 11/22/21  0515 11/21/21  0845   WBC  --  9.1 10.0   RBC  --  3.18* 3.55*   Hemoglobin 9.7* 9.4* 10.2*   Hematocrit 30.4* 29.1* 32.7*   MCV  --  92 92   PLT CT  --  228 242           Recent Labs   Lab 11/21/21  0841   Troponin I 0.02       Lab Results   Component Value Date    HGBA1CPERCNT 6.4 11/21/2021     Recent Labs   Lab 11/23/21  0523 11/22/21  0515 11/21/21  0845   Glucose 76 88 129*   Sodium 142 142 142   Potassium 3.7 4.2 4.3   Chloride 105 111* 109   CO2 24 23 21.4   BUN 65* 59* 59*   Creatinine 4.45* 4.04* 4.54*   EGFR 15* 16* 14*   Calcium 8.6 8.7 9.2       Recent Labs   Lab 11/22/21  0515 11/21/21  0845   Albumin 2.7*  2.7* 3.0*   Protein, Total 5.8*  5.8* 6.7   Bilirubin, Total 0.3  0.3 0.3   Alkaline Phosphatase 148*  148* 161*   ALT 16  16 18    AST (SGOT) 13  13 16        Recent Labs   Lab 11/21/21  1116   Urine Specific Gravity 1.008   pH, Urine 6.0   Protein, UR >=500*   Glucose, UA 50*   Ketones UA Negative   Bilirubin, UA Negative  Blood, UA Negative   Nitrite, UA Negative   Urobilinogen, UA Normal   Leukocyte Esterase, UA Negative   WBC, UA 1   RBC, UA 1        Patient Lines/Drains/Airways Status       Active PICC Line / CVC Line / PIV Line / Drain / Airway / Intraosseous Line / Epidural Line / ART Line / Line / Wound / Pressure Ulcer / NG/OG Tube       Name Placement date Placement time Site Days    Peripheral IV 11/21/21 20 G Right Antecubital 11/21/21  0846  Antecubital  less than 1    Peripheral IV 11/22/21 20 G Right;Posterior Forearm 11/22/21  0640  Forearm  less than 1                   XR Chest 2 Views    Result Date: 11/21/2021  IMPRESSION: Interstitial edema and bilateral  effusions. Atelectasis versus alveolar edema in the lung bases. ReadingStation:[email or web address]    Home Health Needs:  There are no questions and answers to display.       Nutrition assessment done in collaboration with Registered Dietitians:     Time spent:      Ruthell Rummage, MD     11/23/21,6:31 PM   MRN: [id number]                                      CSN: [id number] DOB: October 07, 1965

## 2021-11-23 NOTE — Progress Notes (Signed)
Readmission Risk  [facility]   Patient Name: [name], [name]   Attending Physician: Ruthell Rummage, MD   Today's date:   11/23/2021 LOS: 2 days   Expected Discharge Date      Readmission Assessment:                                                              Discharge Planning  ReAdmit Risk Score: 15.12  Does the patient have perscription coverage?: Yes  Utilize [place] [facility]: No  Confirmed PCP with Pt: Yes  Confirmed PCP name: Candis Schatz  Last PCP Visit Date: Unknown  Confirm Transport to F/U Appt.: Self/Private Vehicle/Friend  Social Work Referral: Not Applicable  CM Comments: RNCM 11/23/21 (CR):  Pulmonary Edema.  Lasix gtt.  Monitor I/O's/Weights.  Nephrology consulted.  Monitor Kidney functions.  Pt pta lives w/wife.  Indep.  Drives.  Able to afford/obtain meds.  Has a PCP.  Pt plans to [place] home w/wife support/transport.  RNCM following  Advance Directive Readdressed: No       IDPA:   Patient Type  Within 30 Days of Previous Admission?: No  Healthcare Decisions  Interviewed:: Patient  Orientation/Decision Making Abilities of Patient: Alert and Oriented x3, able to make decisions  Advance Directive: Patient does not have advance directive  Healthcare Agent Appointed: No  Prior to admission  Prior level of function: Independent with ADLs, Ambulates independently  Type of Residence: Private residence  Home Layout: One level, Stairs to enter with rails (add number in comment)  Have running water, electricity, heat, etc?: Yes  Living Arrangements: Spouse/significant other  How do you get to your MD appointments?: Drives Self  How do you get your groceries?: Self/Spouse  Who fixes your meals?: Self/Spouse  Who does your laundry?: Spouse  Who picks up your prescriptions?: Self  Dressing: Independent  Grooming: Independent  Feeding: Independent  Bathing: Independent  Toileting: Independent  DME Currently at Home: [place], [employer], [place], [place]  Services: None  Discharge Planning  Support Systems: Spouse/significant other  Patient expects to be discharged to:: Home w/sps support  Anticipated [place] plan discussed with:: Same as interviewed  Mode of transportation:: Private car (family member)  Does the patient have perscription coverage?: Yes  Consults/Providers  PT Evaluation Needed: No  OT Evalulation Needed: No  SLP Evaluation Needed: No  Correct PCP listed in Epic?: Yes  Family and PCP  PCP on file was verified as the current PCP?: Yes  In case you are admitted, transferred or discharged, would like family notified?: Unknown  In case you are admitted, transferred or discharged, would like your PCP notified?: Unknown   30 Day Readmission:       Provider Notifications:        Marlowe Shores RN  Case Manager  [phone]

## 2021-11-24 LAB — VH DEXTROSE STICK GLUCOSE
Glucose POCT: 111 mg/dL — ABNORMAL HIGH (ref 71–99)
Glucose POCT: 120 mg/dL — ABNORMAL HIGH (ref 71–99)
Glucose POCT: 158 mg/dL — ABNORMAL HIGH (ref 71–99)

## 2021-11-24 LAB — BASIC METABOLIC PANEL
Anion Gap: 12.7 mMol/L (ref 7.0–18.0)
BUN / Creatinine Ratio: 14.3 Ratio (ref 10.0–30.0)
BUN: 69 mg/dL — ABNORMAL HIGH (ref 7–22)
CO2: 29 mMol/L (ref 20–30)
Calcium: 9 mg/dL (ref 8.5–10.5)
Chloride: 102 mMol/L (ref 98–110)
Creatinine: 4.82 mg/dL — ABNORMAL HIGH (ref 0.80–1.30)
EGFR: 13 mL/min/{1.73_m2} — ABNORMAL LOW (ref 60–150)
Glucose: 119 mg/dL — ABNORMAL HIGH (ref 71–99)
Osmolality Calculated: 301 mOsm/kg — ABNORMAL HIGH (ref 275–300)
Potassium: 3.7 mMol/L (ref 3.5–5.3)
Sodium: 140 mMol/L (ref 136–147)

## 2021-11-24 LAB — MAGNESIUM: Magnesium: 1.8 mg/dL (ref 1.6–2.6)

## 2021-11-24 MED ORDER — FUROSEMIDE 80 MG PO TABS
80.0000 mg | ORAL_TABLET | Freq: Two times a day (BID) | ORAL | 0 refills | Status: DC
Start: 2021-11-24 — End: 2021-11-26

## 2021-11-24 MED ORDER — POTASSIUM CHLORIDE ER 10 MEQ PO TBCR
10.0000 meq | EXTENDED_RELEASE_TABLET | Freq: Two times a day (BID) | ORAL | 0 refills | Status: DC
Start: 2021-11-24 — End: 2021-11-29

## 2021-11-24 MED ORDER — LANTUS SOLOSTAR 100 UNIT/ML SC SOPN
10.0000 [IU] | PEN_INJECTOR | Freq: Every morning | SUBCUTANEOUS | 0 refills | Status: AC
Start: 2021-11-24 — End: ?

## 2021-11-24 MED ORDER — VH POTASSIUM CHLORIDE CRYS ER 10 MEQ PO TBCR (WRAP)
10.0000 meq | EXTENDED_RELEASE_TABLET | Freq: Two times a day (BID) | ORAL | Status: DC
Start: 2021-11-24 — End: 2021-11-24

## 2021-11-24 NOTE — Plan of Care (Signed)
Problem: Hemodynamic Status: Cardiac  Goal: Stable vital signs and fluid balance  Outcome: Progressing     Problem: Impaired Mobility  Goal: Mobility/Activity is maintained at optimal level for patient  Outcome: Progressing     Problem: Moderate/High Fall Risk Score >5  Goal: Patient will remain free of falls  Outcome: Progressing

## 2021-11-24 NOTE — UM Notes (Signed)
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  Acute Pulmonary edema  Hypervolemia  Echo with EF 50-55%  Maintained on lasix drip appeared to be diuresing well  Low sodium diet  Continue monitoring renal function  Intake and output and daily weight  Total output 6.2L  Per renal prep for CPPD     Anemia of renal and Chronic disease  Hemoglobin trending down   No overt GI bleed     Hypertension  Uncontrolled  Continue norvasc/metoprolol  Lasix infusion     OSA  Continue supplemental O2     Type II DM with peripheral neuropathy  Hold basal insulin continue SSI  [place] nutritional insulin     Hyperlipidemia  Continue statin     GERD     Obesity BMI 42  Complicates aspects of carae  Lifestyle modification      Disposition:   Continue lasix drip, close monitoring of output and daily weight  Monitor renal function  Follow renal recs      NEPHROLOGY:   Continue with lasix gtt to affect net volume loss of ~ 2-2.5L/day as azotemia and BP will allow  Lower dose and alter holding parameters for anti-hypertensive agents on 10/9 to allow for more aggressive diuresis  Strict I/Os, daily weights, daily chemistries  Low sodium and fluid restricted diet as ordered   Will follow daily to assess for diuretic response and depending upon trends in sCr/azotemia we shall determine if he would need to start dialysis.   No acute dialysis needs at this time      Vitals: T:97.9 F (36.6 C) (Temporal), BP:124/53, HR:77, RR:20, SaO2:92%     Abnl/Pertinent Labs/Radiology/Diagnostic Studies: HGB 9.7; HCT 30.4; CREAT 4.45; BUN 65;     Pertinent Physical Exam Findings:   Chest: decreased BS bilaterally.     Medication Dose Route Frequency    amLODIPine  5 mg Oral Daily    aspirin EC   81 mg Oral QAM    atorvastatin  40 mg Oral QHS    [START ON 11/24/2021] gabapentin  200 mg Oral Daily    heparin (porcine)  5,000 Units Subcutaneous Q12H [place]    insulin lispro (1 Unit Dial)  2-16 Units Subcutaneous TID AC     And    insulin lispro (1 Unit Dial)  1-9 Units Subcutaneous QHS and 0300    insulin lispro (1 Unit Dial)  5 Units Subcutaneous TID AC    metoprolol tartrate  50 mg Oral BID    potassium chloride  10 mEq Oral Daily    sodium bicarbonate  650 mg Oral BID    sodium chloride (PF)  3 mL Intravenous Q12H SCH       furosemide (LASIX) 500 mg 50 mL infusion 20 mg/hr (11/23/21 0906)        Orders: VS Q8H, TELE, DAILY LABS, CC/RENAL DIET, 1.5L FLUID RESTRICTION, GLYCEMIC CONTROL, MOBILITY PROTOCOL, STRICT I/O, DAILY WT, BLADDER SCAN/STRAIGHT CATH Q6H, FALL PRECAUTIONS,      PT/OT/SLP/CM Assessments or Notes:   CM Comments: RNCM 11/23/21 (CR):  Pulmonary Edema.  Lasix gtt.  Monitor I/O's/Weights.  Nephrology consulted.  Monitor  Kidney functions.  Pt pta lives w/wife.  Indep.  Drives.  Able to afford/obtain meds.  Has a PCP.  Pt plans to [facility] [place] w/wife support/transport.  RNCM following

## 2021-11-24 NOTE — Plan of Care (Addendum)
Patient:  [name], [name], 56 y.o. male    Admission DX: Shortness of breath [R06.02]  Generalized edema [R60.1]  Pulmonary edema [J81.1]  CKD (chronic kidney disease), stage V [N18.5]  Acute congestive heart failure, unspecified heart failure type [I50.9]    Events (last 24 hrs/overnight):        Nursing Shift Summary:      0715: Assumed care of patient. Patient resting in bed.  All needs met at this time. Whiteboard updated  1512: Patient discharged. Reviewed discharge instructions with patient.  All questions answered.   1655: Patient left in wheelchair via transport to main entrance.     Vitals:      BP: 137/60 (11/24/2021  7:57 AM)  Heart Rate: 82 (11/24/2021  7:57 AM)  Temp: 98.4 F (36.9 C) (11/24/2021  7:57 AM)  Resp Rate: 19 (11/24/2021  7:57 AM)   Oxygen Needs:    SpO2: 95 % (11/24/2021  7:57 AM)  O2 Device: None (Room air) (11/24/2021  8:00 AM)      Last 3 Weights:    Recent Weights for the past 720 hrs (Last 3 readings):   Weight   11/24/21 0500 111.4 kg (245 lb 9.5 oz)   11/23/21 0600 116.2 kg (256 lb 2.8 oz)   11/22/21 0405 122.6 kg (270 lb 4.5 oz)       Tele:   Last order of [facility] TELEMETRY MONITORING was found on 11/21/2021 from Hospital Encounter on 11/21/2021         Cardiac Rhythm: Normal Sinus Rhythm     Drips:         Current Facility-Administered Medications   Medication Dose Last Admin    furosemide (LASIX) 500 mg 50 mL infusion  10 mg/hr 10 mg/hr at 11/23/21 1918      I/O: Last 24 hours:    UOP:  Foley/Drain LDA(s):       Intake/Output Summary (Last 24 hours) at 11/24/2021 1058  Last data filed at 11/24/2021 0800  Gross per 24 hour   Intake 840 ml   Output 5325 ml   Net -4485 ml        Drain Output:    No order of INSERT FOLEY CATHETER is found.     No order of EXTERNAL URINARY CATHETER is found.     Catheter necessity reviewed with physician?  []  Remove   []  Keep   [x]  N/A           Last BM: Last BM Date: 11/19/21       Unmeasured Output This Shift:    No data found.     VTE PPX:        Current Facility-Administered Medications (Includes Only Anticoagulants)   Medication Dose Route Frequency Provider Last Rate Last Admin    heparin (porcine) (Heparin Sodium (Porcine)) injection 5,000 Units  5,000 Units Subcutaneous Q12H [facility] Wendee Copp, NP   5,000 Units at 11/24/21 0923       Skin Issues/Concerns:      Skin issues noted this shift:             Chem Sticks (Hyper/Hypo)       Glucose POCT   Date Value Ref Range Status   11/24/2021 120 (H) 71 - 99 mg/dL Final     Comment:     The above 1 analytes were performed by [facility] [phone])  [id number] [address]         Mobility:  PMP Activity: Step 6 - Walks in Room (11/23/2021  8:45 PM)      Falls Risk:   Risk Category: Moderate      PT/OT (recommendations):      No data recorded    Care Act Designee to care for patient after discharge:      [place] Provider?: Yes (11/21/2021  3:00 PM)  Name of Care Act Designee: Jamaine Quintin (11/21/2021  3:00 PM)  Phone Number of Care Act Designee: [phone] (11/21/2021  3:00 PM)      Care Partner(s) who will help during hospital stay:        Designated Patient Care Partner #1: Skanda Worlds (11/21/2021  3:00 PM)      Patient Rounding, Team Members Present    [x]  Rounds Occurred   [x]  Patient    [x]  MD    [x]  CM    [x]  Primary Nurse    []  Charge Nurse    []  Pharmacist      ("In a few words, as a patient, do you have any goals or concerns you would like to share  with the team today?" )     Patient stated goals and/or concerns:          Action plan:                       Problem: Hemodynamic Status: Cardiac  Goal: Stable vital signs and fluid balance  Outcome: Progressing  Flowsheets (Taken 11/24/2021 0754)  Stable vital signs and fluid balance:   Monitor/assess vital signs and telemetry per unit protocol   Weigh on admission and [name] weight daily   Assess signs and symptoms associated with cardiac rhythm changes   Monitor intake/output per unit protocol  and/or LIP order   Monitor lab values   Monitor for leg swelling/edema and report to LIP if abnormal

## 2021-11-24 NOTE — Discharge Summary (Signed)
Medicine Discharge Summary   [facility]   Patient Name: [name]   Attending Physician: Carl Bleecker, Johnney Ou, MD PCP: Rhunette Croft, MD   Date of Admission: 11/21/2021 D/C Date: 11/24/2021   Discharge Diagnoses:     AKI on CKD stage IV progressing to stage V  Acute pulmonary edema  Hyperkalemia  Anemia of renal on chronic disease  Essential hypertension  Obstructive sleep apnea  Type 2 diabetes mellitus with peripheral neuropathy  Hyperlipidemia  GERD  Obesity with BMI 47     Hospital Course       56 year old male with past medical history of hyperlipidemia, hypertension, type 2 diabetes mellitus with diabetic foot ulcer, CKD stage IV, neuropathy, OSA presented with bilateral  Associated with 30 pounds weight gain.  Nephrology was consulted and started on aggressive IV diuresis with Lasix 20 Mg per hour.  TTE on 11/22/2021 showed LVEF 50 to 55%, no significant valvular abnormality.  Lower extremity swelling and weight improved by 30 pounds with aggressive IV diuresis.  Exertional dyspnea improved as well.  Patient wanted to be discharged for his sleep study appointment scheduled tomorrow on 11/25/2021 as an outpatient and he has had waited for this appointment for 3 months.  Discussed with nephrology and patient will be discharged on p.o. Lasix 80 Mg twice daily and would return voluntarily back to hospital tomorrow after his sleep study appointment as he still needs IV diuresis.  Discussed with patient and he understands the plan.      Patient has insulin sliding scale and Lantus 68 units in addition to Victoza listed on home medications.  Has not taken Victoza for quite some time.  Inpatient Lantus dose decreased to 10 units only because of a.m. blood glucose ranging in 80s. A1C noted 6.8 .  He will need further dose titration    He is currently hemodynamically stable on room air.    Principal Problem:    Pulmonary edema  Resolved Problems:    * No resolved hospital  problems. *    Pending Results and other significant studies:  None     Discharge Instructions:          Disposition: Home  Diet: Cardiac Consistent Carbohydrates  Activity: As tolerated  Discharge Code Status: Full Code    Rhunette Croft, MD  [address]  [place] [address]  [phone]    Follow up on 11/25/2021  As scheduled at 2:00 pm.       Discharge Medications:                                                                        Discharge Medication List        Taking      amLODIPine 10 MG tablet  Dose: 10 mg  Commonly known as: NORVASC  Take 1 tablet (10 mg) by mouth daily     aspirin EC 81 MG EC tablet  Dose: 81 mg  Generic drug: aspirin EC  Take 1 tablet (81 mg) by mouth every [name]     atorvastatin 40 MG tablet  Dose: 40 mg  Commonly known as: LIPITOR  Take 1 tablet (40 mg) by mouth nightly  furosemide 80 MG tablet  Dose: 80 mg  What changed:   medication strength  how much to take  when to take this  Commonly known as: LASIX  Take 1 tablet (80 mg) by mouth 2 (two) times daily for 5 days     gabapentin 300 MG capsule  Dose: 300 mg  Commonly known as: NEURONTIN  Take 1 capsule (300 mg) by mouth 3 (three) times daily     metoprolol tartrate 50 MG tablet  Dose: 50 mg  Commonly known as: LOPRESSOR  Take 1 tablet (50 mg) by mouth 2 (two) times daily     Multi For Him 50+ Tabs  Dose: 1 tablet  Take 1 tablet by mouth every [name]     NovoLOG FlexPen ReliOn 100 UNIT/ML injection pen  Generic drug: insulin aspart  Inject into the skin 3 (three) times daily before meals Sliding scale     potassium chloride 10 MEQ CR tablet  Dose: 10 mEq  Commonly known as: K-TAB  Take 1 tablet (10 mEq) by mouth 2 (two) times daily for 5 days     Victoza 18 MG/3ML injection  Dose: 1.8 mg  Generic drug: liraglutide  Inject 1.8 mg into the skin every [name]     vitamin D 25 MCG (1000 UT) tablet  Dose: 2,000 Units  Commonly known as: CHOLECALCIFEROL  Take 2 tablets (2,000 Units) by mouth 2 (two) times daily             STOP taking these medications      hydrALAZINE 50 MG tablet  Commonly known as: APRESOLINE     insulin glargine 100 UNIT/ML injection pen     sodium bicarbonate 650 MG tablet               Discharge Day Exam (11/24/2021):     Blood pressure 123/54, pulse 77, temperature 98.2 F (36.8 C), temperature source Temporal, resp. rate 19, height 1.702 m (5\' 7" ), weight 111.4 kg (245 lb 9.5 oz), SpO2 94 %.           General: Patient is awake. In no acute distress.  Chest: CTA bilaterally. No rhonchi, no wheezing. No use of accessory muscles.  CVS: Normal rate and regular rhythm no murmurs, without JVD.  Abdomen: Soft, non-tender, no guarding or rigidity, with normal bowel sounds.  Extremities: No pitting edema, pulses palpable, no calf swelling and no gross deformity.  Skin: Warm, dry  NEURO: No motor or sensory deficits.     Recent Labs      Recent Labs   Lab 11/23/21  0523 11/22/21  0515 11/21/21  0845   WBC  --  9.1 10.0   RBC  --  3.18* 3.55*   Hemoglobin 9.7* 9.4* 10.2*   Hematocrit 30.4* 29.1* 32.7*   MCV  --  92 92   PLT CT  --  228 242         Recent Labs   Lab 11/21/21  0841   Troponin I 0.02     Lab Results   Component Value Date    HGBA1CPERCNT 6.4 11/21/2021     Recent Labs   Lab 11/24/21  0456 11/23/21  0523 11/22/21  0515 11/21/21  0845   Glucose 119* 76 88 129*   Sodium 140 142 142 142   Potassium 3.7 3.7 4.2 4.3   Chloride 102 105 111* 109   CO2 29 24 23  21.4   BUN 69* 65* 59* 59*   Creatinine  4.82* 4.45* 4.04* 4.54*   EGFR 13* 15* 16* 14*   Calcium 9.0 8.6 8.7 9.2     Recent Labs   Lab 11/24/21  0456 11/22/21  0515 11/21/21  0845   Magnesium 1.8  --   --    Albumin  --  2.7*  2.7* 3.0*   Protein, Total  --  5.8*  5.8* 6.7   Bilirubin, Total  --  0.3  0.3 0.3   Alkaline Phosphatase  --  148*  148* 161*   ALT  --  16  16 18    AST (SGOT)  --  13  13 16         Allergies:      Influenza virus vaccine   Time spent on discharging the patient:  35 minutes   XR Chest 2 Views    Result Date:  11/21/2021  IMPRESSION: Interstitial edema and bilateral effusions. Atelectasis versus alveolar edema in the lung bases. ReadingStation:[email or web address]    Home Health Needs:  There are no questions and answers to display.      Glorianna Gott, MD         11/24/21 2:16 PM   MRN: [id number]                                      CSN: [id number] DOB: October 17, 1965

## 2021-11-24 NOTE — Progress Notes (Signed)
Renal Progress Note    Follow up for:  CKD, stage 5    Subjective:     Patient questioning if he can be discharged today.  Denies any shortness of breath at this time.  Lower extremity edema swelling improved.    Okay to discharge from renal standpoint, with plans of readmission post sleep study for continuation of IV diuretics.  Recommend discharging on Lasix 80 mg twice daily    Objective:     Vitals: BP 123/54   Pulse 77   Temp 98.2 F (36.8 C) (Temporal)   Resp 19   Ht 1.702 m (5\' 7" )   Wt 111.4 kg (245 lb 9.5 oz)   SpO2 94%   BMI 38.47 kg/m       I/O:   Intake/Output Summary (Last 24 hours) at 11/24/2021 1323  Last data filed at 11/24/2021 1158  Gross per 24 hour   Intake 710 ml   Output 5325 ml   Net -4615 ml         Weights:  Wt Readings from Last 1 Encounters:   11/24/21 0500 111.4 kg (245 lb 9.5 oz)   11/23/21 0600 116.2 kg (256 lb 2.8 oz)   11/22/21 0405 122.6 kg (270 lb 4.5 oz)   11/21/21 0832 122.5 kg (270 lb)       Physical Exam:  General: No distress.  Lungs: CTA  Heart: RRR, no murmur, gallop, rub, or heave.  Abdomen: BS present, soft, moderate distention  Extremities: 2+ edema.  Access: N/A        Labs:  Labs reviewed.  Radiology results reviewed.  Pertinent findings below.  Recent Labs     11/23/21  0523 11/22/21  0515   WBC  --  9.1   Hemoglobin 9.7* 9.4*   Hematocrit 30.4* 29.1*   PLT CT  --  228       No results for input(s): "PT", "INR", "APTT" in the last 72 hours.  No results for input(s): "TROPI", "CK", "CKMBINDEX" in the last 72 hours.    No results for input(s): "BNP" in the last 72 hours.                  No results found.   Recent Labs     11/24/21  0456 11/23/21  0523 11/22/21  0515   Glucose 119* 76 88   Sodium 140 142 142   Potassium 3.7 3.7 4.2   Chloride 102 105 111*   CO2 29 24 23    BUN 69* 65* 59*   Creatinine 4.82* 4.45* 4.04*   EGFR 13* 15* 16*   Calcium 9.0 8.6 8.7       Recent Labs     11/24/21  0456   Magnesium 1.8     Recent Labs     11/22/21  0515   Albumin 2.7*   2.7*   Protein, Total 5.8*  5.8*   Bilirubin, Total 0.3  0.3   Alkaline Phosphatase 148*  148*   ALT 16  16   AST (SGOT) 13  13            No results for input(s): "SGUR", "LABPH", "PROTEINUR", "GLUUA", "KETONESUA", "BILIUA", "BLOODUA", "NITRITEUA", "UROBILIUA", "LEUA", "WBCUA", "RBCUA", "BACTERIAUA", "AMORPHOUSUA" in the last 72 hours.                 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   Baseline  sCr ~ 4  Follows with Dr. Fernande Boyden for CKD management  Has completed ESKD education and voices preference for CCPD or pre-emptive transplant  Has been referred to [facility] transplant  backup AVF placed 08/2021  Hypervolemia with bilateral pleural effusions secondary to advancing CKD and nephrotic proteinuria  Nephrotic proteinuria  UPCR 7-13  Small Bilateral pleural effusions secondary to nephrotic proteinuria and advanced CKD  Essential HTN exacerbated by hypervolemia  Type II DM  Secondary HPTH  Anemia of CKD, ESA naive to date      Plan:     Lasix drip held today with original plan to resume.  Patient wanting to be discharged to attend sleep study   Lower dose and alter holding parameters for anti-hypertensive agents on 10/9 to allow for more aggressive diuresis  Strict I/Os, daily weights, daily chemistries  Low sodium and fluid restricted diet as ordered     Will follow daily to assess for diuretic response and depending upon trends in sCr/azotemia we shall determine if he would need to start dialysis.     No acute dialysis needs at this time  Will follow closely with you    Okay to discharge from renal standpoint, with plans of readmission post sleep study for continuation of IV diuretics.  Recommend discharging on Lasix 80 mg twice daily    Orders and Medications reviewed in Epic.    Artis Delay, NP, Pager [phone]  1:23 PM  11/24/2021  [facility]  Dr. Josephina Shih, Dr. Fernande Boyden, Dr.  Oswaldo Milian, Dr. Recardo Evangelist, Artis Delay NP, Maryelizabeth Kaufmann NP.   Office: [phone]

## 2021-11-25 ENCOUNTER — Encounter (RURAL_HEALTH_CENTER): Payer: Self-pay | Admitting: Family Medicine

## 2021-11-25 ENCOUNTER — Ambulatory Visit: Payer: PRIVATE HEALTH INSURANCE | Attending: Family Medicine | Admitting: Family Medicine

## 2021-11-25 ENCOUNTER — Telehealth: Payer: Self-pay

## 2021-11-25 VITALS — BP 104/58 | HR 63 | Temp 98.9°F | Resp 22 | Ht 67.0 in | Wt 243.0 lb

## 2021-11-25 DIAGNOSIS — N185 Chronic kidney disease, stage 5: Secondary | ICD-10-CM

## 2021-11-25 DIAGNOSIS — J810 Acute pulmonary edema: Secondary | ICD-10-CM

## 2021-11-25 NOTE — Progress Notes (Signed)
[facility]  [address]  [place], [place] [address]  Phone [phone]   Fax [phone]    11/25/2021     Patient:   [name]                                                                                                             DOB:       June 11, 1965                                        Provider Name:  Rhunette Croft, MD     SUBJECTIVE       HPI:  [name] is a 56 y.o. male who is being seen today for   Chief Complaint   Patient presents with    Hospital Follow-up     [facility] ED - Admission 10/8-10/12/2021 CKD Stage V, Acute CHF, Acute pulmonary edema, generalized edema     The patient was admitted with CHF with pulmonary edema.  He has not been sleeping and it has caused him some hallucinations when he is not sleeping.  He has had orthopnea, weight gain, shortness of breath, lower extremity edema, prior to his hospitalization.  I reviewed his discharge summary and he was discharged supposedly to attend an outpatient appointment, although his actual appointment with the sleep clinic is not until December.  The plan at that time was to discharge him so that he could attend the appointment and then have him come back to the ED for likely readmission for a lasix drip.  Today he states that the edema to his abdomen has decreased.  He had noted a decrease in his lower extremity edema and he is no longer short of breath and no DOE.  He denies chest pain or palpitations.     His nephrologist is Dr. Fernande Boyden and he has been told that he does not need dialysis now.  His plan is for peritoneal dialysis but he does not have a peritoneal catheter in place at this point.  He would also like to be evaluated for a transplant and does have an appointment with [facility] in December.  He does have a mature fistula to the LUE.  He has had  lower extremity edema and has  a wound to the LLE that has been leaking serous fluid.  He lost 26 lbs with a lasix drip while he was hospitalized.  He states that his blood sugar was well controlled in the hospital.  He denies fever.  He did have some delirium while he was hospitalized.        ROS:  Review of Systems    All other systems reviewed and negative except as above, pertinent findings in HPI.    Patient Active Problem List   Diagnosis    Type 2 diabetes mellitus with hyperglycemia, with long-term current use of insulin  Carpal tunnel syndrome of right wrist    Essential hypertension    History of kidney injury    Mixed hyperlipidemia    Diabetic nephropathy associated with type 2 diabetes mellitus    Diabetic polyneuropathy associated with type 2 diabetes mellitus    Chronic pain of left upper extremity    Pain in lower jaw    Muscle cramps    CKD (chronic kidney disease) stage 5, GFR less than 15 ml/min    CGM Dex Com    Backache    Charcot's joint of foot    Dental caries extending into pulp    Diabetic retinopathy    GERD (gastroesophageal reflux disease)    Vitamin D deficiency    Obesity, Class III, BMI 40-49.9 (morbid obesity)    Anemia in chronic kidney disease    Renal osteodystrophy    Secondary hyperparathyroidism    Pulmonary edema    Chronic metabolic acidosis    Persistent proteinuria      Social History     Tobacco Use   Smoking Status Former    Packs/day: .25    Types: Cigarettes    Quit date: 09/06/2017    Years since quitting: 4.2   Smokeless Tobacco Never   Tobacco Comments    3 packs a week      Social History     Substance and Sexual Activity   Alcohol Use No    Alcohol/week: 0.0 standard drinks of alcohol    Comment: in past      Allergies   Allergen Reactions    Influenza Virus Vaccine Anaphylaxis     Current Outpatient Medications   Medication Instructions    amLODIPine (NORVASC) 10 mg, Oral, Daily    aspirin EC (ASPIRIN EC) 81 mg, Oral, Every morning    atorvastatin (LIPITOR) 40  mg, Oral, At bedtime    Cholecalciferol (VITAMIN D3) 5,000 Units, Oral, Daily, Pt takes 4000 units daily    furosemide (LASIX) 80 mg, Oral, 2 times daily    gabapentin (NEURONTIN) 300 mg, Oral, 3 times daily    insulin aspart (NovoLOG FlexPen ReliOn) 100 UNIT/ML injection pen Subcutaneous, 3 times daily before meals, Sliding scale    insulin glargine 10 Units, Subcutaneous, Every morning    metoprolol tartrate (LOPRESSOR) 50 mg, Oral, 2 times daily    Multiple Vitamins-Minerals (Multi For Him 50+) Tab 1 tablet, Oral, Every morning    potassium chloride (K-TAB) 10 MEQ CR tablet 10 mEq, Oral, 2 times daily    vitamin D (CHOLECALCIFEROL) 2,000 Units, Oral, 2 times daily        PHYSICAL EXAM     Vital Signs:  BP 104/58 (BP Site: Right arm, Patient Position: Sitting, Cuff Size: Large)   Pulse 63   Temp 98.9 F (37.2 C) (Tympanic)   Resp 22   Ht 1.702 m (5\' 7" )   Wt 110.2 kg (243 lb)   SpO2 91%   BMI 38.06 kg/m   Physical Exam  Vitals reviewed.   Constitutional:       General: He is not in acute distress.     Appearance: Normal appearance. He is not ill-appearing.   HENT:      Head: Normocephalic and atraumatic.   Eyes:      Conjunctiva/sclera: Conjunctivae normal.   Cardiovascular:      Rate and Rhythm: Normal rate and regular rhythm.      Heart sounds: Normal heart sounds.      Comments: Thrill present to  the AV fistula L upper arm  Pulmonary:      Effort: Pulmonary effort is normal.      Breath sounds: Normal breath sounds.   Abdominal:      Palpations: Abdomen is soft.      Tenderness: There is no abdominal tenderness.   Musculoskeletal:         General: No swelling.      Cervical back: Neck supple.      Right lower leg: Edema present.      Left lower leg: Edema present.   Skin:     General: Skin is warm and dry.      Capillary Refill: Capillary refill takes less than 2 seconds.      Findings: Lesion present.   Neurological:      General: No focal deficit present.      Mental Status: He is alert and oriented  to person, place, and time.   Psychiatric:         Mood and Affect: Mood normal.         Behavior: Behavior normal.         Thought Content: Thought content normal.         Judgment: Judgment normal.           Recent Labs:       Radiology Results:  XR Chest 2 Views    Result Date: 11/21/2021  IMPRESSION: Interstitial edema and bilateral effusions. Atelectasis versus alveolar edema in the lung bases. ReadingStation:[id number]     ASSESSMENT and PLAN   MDM: I am unclear why the patient was discharged as he did not have an appointment.  He did have some delirium while he was in the hospital.  His wife is with him today.-She is quite upset that he was discharged without having discussed his care with her.  I did discuss his care with Dr. Lorenda Ishihara to determine   if they were aware that he was to come back to the emergency room for additional parenteral lasix.  At this point I gave him the option of going back to the ER and staying home and trying the Lasix orally.  He does opt to stay home.  He is feeling much better.  I did discuss that if he is resistant to the Lasix the fluid may come right back.  I will go ahead and send my note to Dr. Fernande Boyden but at the present time the edema has gone down and he is not presently short of breath, he does not have JVD or an S3 or S4.  The lungs are clear right now and he has no shortness of breath.  Unna boot was placed to the left leg for the wound which does not appear to have any infection or inflammation.  Is not tender to the touch.  That should get it to heal up and I have requested that he keep his legs elevated.  He has not yet been contacted by the nurse navigator for renal failure.    57 Minutes were spent in patient evaluation, direct patient care, chart review including specialty consultation and ED visits,   care planning and collaboration.  50% of the time was spent in patient education as noted.      Problem List Items Addressed This Visit       CKD (chronic kidney  disease) stage 5, GFR less than 15 ml/min - Primary    Relevant Orders    Ambulatory referral to Nurse Navigators  Pulmonary edema        Follow up  Return in about 5 days (around 11/30/2021).     Patient was counseled on possible medicine side effects which may include rash, swelling and/or stomach upset.  Patient was instructed to notify me or the ER if they experience problems.    No orders of the defined types were placed in this encounter.     Patient Instructions   1.  Daily weights and record.  If you are not dropping by at least 2 pounds daily then we may need to give you additional Lasix.  2.  There is a good chance that she will be resistant to the oral Lasix and if you start getting short of breath or your swelling increases then I would like for you to return to the emergency room and they will reevaluate to and possibly admit to.  3.  For the time being we will continue the Lasix 80 mg twice a day.  I will send a copy of my note to Dr. Fernande Boyden with the plan.      Before leaving the office, the patient was informed of all ordered diagnostic tests and consults.     If you have not heard back from [place] about test results in 14 days, please call the office at [phone].    Signed by: Rhunette Croft, MD    The patient's electronic medical record was reviewed, any changes in the past medical history, past surgical history, medications, diagnostic tests were noted, and the record was updated accordingly.     Discussed option available for my chart which provides electronic access to diagnostic results.

## 2021-11-25 NOTE — Telephone Encounter (Signed)
Call received from Dr. Candis Schatz requesting clarification on discharge plan for pt. Chart entered by this RN and Dr. Noreene Larsson to assist Dr. Candis Schatz.

## 2021-11-25 NOTE — Patient Instructions (Signed)
1.  Daily weights and record.  If you are not dropping by at least 2 pounds daily then we may need to give you additional Lasix.  2.  There is a good chance that she will be resistant to the oral Lasix and if you start getting short of breath or your swelling increases then I would like for you to return to the emergency room and they will reevaluate to and possibly admit to.  3.  For the time being we will continue the Lasix 80 mg twice a day.  I will send a copy of my note to Dr. Fernande Boyden with the plan.

## 2021-11-26 ENCOUNTER — Other Ambulatory Visit (RURAL_HEALTH_CENTER): Payer: Self-pay

## 2021-11-26 ENCOUNTER — Ambulatory Visit (RURAL_HEALTH_CENTER): Payer: Self-pay | Admitting: Family Medicine

## 2021-11-26 MED ORDER — FUROSEMIDE 80 MG PO TABS
80.0000 mg | ORAL_TABLET | Freq: Two times a day (BID) | ORAL | 2 refills | Status: AC
Start: 2021-11-26 — End: 2021-12-01

## 2021-11-26 NOTE — Telephone Encounter (Signed)
Pt needs refill of lasix.    Last office visit: 10/12  Last prescribed on: 10/11  Last pertinent date of labs: 10/9  Next follow up visit: 10/17

## 2021-11-29 ENCOUNTER — Other Ambulatory Visit (RURAL_HEALTH_CENTER): Payer: Self-pay

## 2021-11-30 ENCOUNTER — Ambulatory Visit: Payer: PRIVATE HEALTH INSURANCE | Attending: Family Medicine | Admitting: Family Medicine

## 2021-11-30 ENCOUNTER — Encounter (RURAL_HEALTH_CENTER): Payer: Self-pay | Admitting: Family Medicine

## 2021-11-30 VITALS — BP 120/60 | HR 75 | Temp 96.6°F | Resp 20 | Ht 67.0 in | Wt 243.4 lb

## 2021-11-30 DIAGNOSIS — I509 Heart failure, unspecified: Secondary | ICD-10-CM

## 2021-11-30 DIAGNOSIS — L97921 Non-pressure chronic ulcer of unspecified part of left lower leg limited to breakdown of skin: Secondary | ICD-10-CM

## 2021-11-30 DIAGNOSIS — N185 Chronic kidney disease, stage 5: Secondary | ICD-10-CM

## 2021-11-30 NOTE — Progress Notes (Signed)
[facility]  [address]  [place], [place] [address]  Phone [phone]   Fax [phone]    11/30/2021     Patient:   [name]                                                                                                             DOB:       1965-08-01                                        Provider Name:  Rhunette Croft, MD     SUBJECTIVE       HPI:  [name] is a 56 y.o. male who is being seen today for   Chief Complaint   Patient presents with    Chronic Kidney Disease     5 day F/U    Edema     Acute pulmonary edema    leg wound     Left leg wound     CKD stage V:  The patient's nephrologist is Dr. Fernande Boyden.  He was admitted with acute pulmonary edema.  He was started on Lasix 80 mg twice daily and was discharged.  He was seen on Friday and he states that he is doing much better.  He states that he is now able to sleep in his recliner and he had been sleeping for several days due to the shortness of breath.  He has no further shortness of breath.  He does still have some lower extremity edema but he comes in today to have the [employer] removed.  Is adequate healing to the leg ulcer to the left anterior shin.  There is no tenderness.  He states that by the morning that his swelling has gone down.  He is not presently wearing compression stockings.  He is still diuresing with the Lasix twice daily.  He denies cough or shortness of breath now.  He is doing daily weights.  He is still losing about a pound a day.  He comes in with his wife today who contributes to the history.  He is on amlodipine 10 mg daily for his blood pressure which is contributing to some of the lower extremity edema.          ROS:  Review of Systems   Gastrointestinal:  Negative for nausea.   Skin:         Pruritus without rash, skin dry        All other systems reviewed and negative except as above, pertinent findings in HPI.    Patient Active Problem List   Diagnosis    Type 2 diabetes mellitus with hyperglycemia, with long-term current use of insulin    Carpal tunnel syndrome of right wrist    Essential hypertension    History of kidney injury    Mixed hyperlipidemia  Diabetic nephropathy associated with type 2 diabetes mellitus    Diabetic polyneuropathy associated with type 2 diabetes mellitus    Chronic pain of left upper extremity    Pain in lower jaw    Muscle cramps    CKD (chronic kidney disease) stage 5, GFR less than 15 ml/min    CGM Dex Com    Backache    Charcot's joint of foot    Dental caries extending into pulp    Diabetic retinopathy    GERD (gastroesophageal reflux disease)    Vitamin D deficiency    Obesity, Class III, BMI 40-49.9 (morbid obesity)    Anemia in chronic kidney disease    Renal osteodystrophy    Secondary hyperparathyroidism    Pulmonary edema    Chronic metabolic acidosis    Persistent proteinuria      Social History     Tobacco Use   Smoking Status Former    Packs/day: .25    Types: Cigarettes    Quit date: 09/06/2017    Years since quitting: 4.2   Smokeless Tobacco Never   Tobacco Comments    3 packs a week      Social History     Substance and Sexual Activity   Alcohol Use No    Alcohol/week: 0.0 standard drinks of alcohol    Comment: in past      Allergies   Allergen Reactions    Influenza Virus Vaccine Anaphylaxis     Current Outpatient Medications   Medication Instructions    amLODIPine (NORVASC) 10 mg, Oral, Daily    aspirin EC (ASPIRIN EC) 81 mg, Oral, Every morning    atorvastatin (LIPITOR) 40 mg, Oral, At bedtime    Cholecalciferol (VITAMIN D3) 5,000 Units, Oral, Daily, Pt takes 4000 units daily    furosemide (LASIX) 80 mg, Oral, 2 times daily    gabapentin (NEURONTIN) 300 mg, Oral, 3 times daily    insulin aspart (NovoLOG FlexPen ReliOn) 100 UNIT/ML injection pen Subcutaneous, 3 times daily before meals,  Sliding scale    insulin glargine 10 Units, Subcutaneous, Every morning    metoprolol tartrate (LOPRESSOR) 50 mg, Oral, 2 times daily    Multiple Vitamins-Minerals (Multi For Him 50+) Tab 1 tablet, Oral, Every morning    vitamin D (CHOLECALCIFEROL) 2,000 Units, Oral, 2 times daily        PHYSICAL EXAM     Vital Signs:  BP 120/60 (BP Site: Right arm, Patient Position: Sitting, Cuff Size: Large)   Pulse 75   Temp (!) 96.6 F (35.9 C) (Tympanic)   Resp 20   Ht 1.702 m (5\' 7" )   Wt 110.4 kg (243 lb 6.4 oz)   SpO2 94%   BMI 38.12 kg/m   Physical Exam  Vitals reviewed.   Constitutional:       General: He is not in acute distress.     Appearance: Normal appearance. He is not ill-appearing.   HENT:      Head: Normocephalic and atraumatic.   Eyes:      Conjunctiva/sclera: Conjunctivae normal.   Cardiovascular:      Rate and Rhythm: Normal rate and regular rhythm.      Heart sounds: Normal heart sounds. No murmur heard.     No gallop.   Pulmonary:      Effort: Pulmonary effort is normal.      Breath sounds: Normal breath sounds. No rales.   Musculoskeletal:      Cervical back: Neck supple.  Right lower leg: Edema (2+) present.      Left lower leg: Edema (2+) present.   Skin:     General: Skin is warm and dry.      Capillary Refill: Capillary refill takes less than 2 seconds.      Comments: Wound healing to the anterior shin wound to the left lower extremity no erythema, warmth, edema.  2+ most lower extremity edema which patient states is his baseline.  He also states that the swelling goes down by the morning.   Neurological:      General: No focal deficit present.      Mental Status: He is alert and oriented to person, place, and time.   Psychiatric:         Mood and Affect: Mood normal.         Behavior: Behavior normal.         Thought Content: Thought content normal.         Judgment: Judgment normal.           Recent Labs:  Recent Results (from the past 2016 hour(s))   Lipid panel    Collection Time:  09/16/21  7:54 AM   Result Value Ref Range    Cholesterol 132 75 - 199 mg/dL    Triglycerides 324 (H) 10 - 150 mg/dL    HDL 28 (L) 40 - 55 mg/dL    LDL Calculated 39 mg/dL    Coronary Heart Disease Risk 4.71     VLDL 65 (H) 0 - 40   Hemoglobin A1C    Collection Time: 09/16/21  7:54 AM   Result Value Ref Range    Hgb A1C, % 6.7 %    Estimated Average Glucose 146 mg/dL   ALT    Collection Time: 09/16/21  7:54 AM   Result Value Ref Range    ALT 22 0 - 55 U/L   Renal function panel    Collection Time: 09/16/21  7:54 AM   Result Value Ref Range    Sodium 140 136 - 147 mMol/L    Potassium 4.6 3.5 - 5.3 mMol/L    Chloride 108 98 - 110 mMol/L    CO2 22.2 20.0 - 30.0 mMol/L    Calcium 8.8 8.5 - 10.5 mg/dL    Glucose 181 (H) 71 - 99 mg/dL    Creatinine 4.25 (H) 0.80 - 1.30 mg/dL    BUN 56 (H) 7 - 22 mg/dL    Albumin 3.0 (L) 3.5 - 5.0 gm/dL    Phosphorus 4.4 2.3 - 4.7 mg/dL    Anion Gap 14.4 7.0 - 18.0 mMol/L    BUN / Creatinine Ratio 13.2 10.0 - 30.0 Ratio    EGFR 15 (L) 60 - 150 mL/min/1.77m2    Osmolality Calculated 299 275 - 300 mOsm/kg   Vitamin D,25 OH, Total    Collection Time: 09/16/21  7:57 AM   Result Value Ref Range    Vitamin D 25-Hydroxy 23.3 (L) 30.0 - 80.0 ng/mL   Hemoglobin and hematocrit, blood    Collection Time: 09/16/21  7:57 AM   Result Value Ref Range    Hemoglobin 11.3 (L) 13.0 - 17.5 gm/dL    Hematocrit 36.7 (L) 39.0 - 52.5 %   PTH, Intact    Collection Time: 09/16/21  7:57 AM   Result Value Ref Range    PTH Intact 89.9 (H) 20.0 - 83.0 pg/mL   Protein / creatinine ratio, urine    Collection Time: 09/16/21  7:57 AM   Result Value Ref Range    Urine Protein 563 (H) 0 - 14 mg/dL    Urine Creatinine Random 58 mg/dL    Protein/Creatinine Ratio 9.71 (H) 0.00 - 0.09 Ratio   Vitamin D,25 OH, Total    Collection Time: 10/21/21  3:23 PM   Result Value Ref Range    Vitamin D 25-Hydroxy 27.9 (L) 30.0 - 80.0 ng/mL   Hemoglobin and hematocrit, blood    Collection Time: 10/21/21  3:23 PM   Result Value Ref Range     Hemoglobin 11.4 (L) 13.0 - 17.5 gm/dL    Hematocrit 36.5 (L) 39.0 - 52.5 %   PTH, Intact    Collection Time: 10/21/21  3:23 PM   Result Value Ref Range    PTH Intact 64.6 20.0 - 83.0 pg/mL   Renal function panel    Collection Time: 10/21/21  3:23 PM   Result Value Ref Range    Sodium 142 136 - 147 mMol/L    Potassium 4.8 3.5 - 5.3 mMol/L    Chloride 110 98 - 110 mMol/L    CO2 22.2 20.0 - 30.0 mMol/L    Calcium 9.2 8.5 - 10.5 mg/dL    Glucose 142 (H) 71 - 99 mg/dL    Creatinine 3.92 (H) 0.80 - 1.30 mg/dL    BUN 59 (H) 7 - 22 mg/dL    Albumin 2.7 (L) 3.5 - 5.0 gm/dL    Phosphorus 4.7 2.3 - 4.7 mg/dL    Anion Gap 14.6 7.0 - 18.0 mMol/L    BUN / Creatinine Ratio 15.1 10.0 - 30.0 Ratio    EGFR 17 (L) 60 - 150 mL/min/1.10m2    Osmolality Calculated 302 (H) 275 - 300 mOsm/kg   Protein / creatinine ratio, urine    Collection Time: 10/21/21  3:23 PM   Result Value Ref Range    Urine Protein 477 (H) 0 - 14 mg/dL    Urine Creatinine Random 48 mg/dL    Protein/Creatinine Ratio 9.94 (H) 0.00 - 0.09 Ratio   Troponin I    Collection Time: 11/21/21  8:41 AM   Result Value Ref Range    Troponin I 0.02 0.00 - 0.02 ng/mL   B-type Natriuretic Peptide    Collection Time: 11/21/21  8:41 AM   Result Value Ref Range    B-Natriuretic Peptide 820.7 (H) 0.0 - 100.0 pg/mL   Comprehensive metabolic panel    Collection Time: 11/21/21  8:45 AM   Result Value Ref Range    Sodium 142 136 - 147 mMol/L    Potassium 4.3 3.5 - 5.3 mMol/L    Chloride 109 98 - 110 mMol/L    CO2 21.4 20.0 - 30.0 mMol/L    Calcium 9.2 8.5 - 10.5 mg/dL    Glucose 129 (H) 71 - 99 mg/dL    Creatinine 4.54 (H) 0.80 - 1.30 mg/dL    BUN 59 (H) 7 - 22 mg/dL    Protein, Total 6.7 6.0 - 8.3 gm/dL    Albumin 3.0 (L) 3.5 - 5.0 gm/dL    Alkaline Phosphatase 161 (H) 40 - 145 U/L    ALT 18 0 - 55 U/L    AST (SGOT) 16 10 - 42 U/L    Bilirubin, Total 0.3 0.1 - 1.2 mg/dL    Albumin/Globulin Ratio 0.81 0.80 - 2.00 Ratio    Anion Gap 15.9 7.0 - 18.0 mMol/L    BUN / Creatinine Ratio 13.0 10.0  - 30.0 Ratio  EGFR 14 (L) 60 - 150 mL/min/1.34m2    Osmolality Calculated 301 (H) 275 - 300 mOsm/kg    Globulin 3.7 2.0 - 4.0 gm/dL   CBC and differential    Collection Time: 11/21/21  8:45 AM   Result Value Ref Range    WBC 10.0 4.0 - 11.0 K/cmm    RBC 3.55 (L) 4.00 - 5.70 M/cmm    Hemoglobin 10.2 (L) 13.0 - 17.5 gm/dL    Hematocrit 32.7 (L) 39.0 - 52.5 %    MCV 92 80 - 100 fL    MCH 29 28 - 35 pg    MCHC 31 (L) 32 - 36 gm/dL    RDW 13.7 11.0 - 14.0 %    PLT CT 242 130 - 440 K/cmm    MPV 9.3 6.0 - 10.0 fL    Neutrophils % 76.8 42.0 - 78.0 %    Lymphocytes 14.8 (L) 15.0 - 46.0 %    Monocytes 7.4 3.0 - 15.0 %    Eosinophils % 0.8 0.0 - 7.0 %    Basophils % 0.1 0.0 - 3.0 %    Neutrophils Absolute 7.7 1.7 - 8.6 K/cmm    Lymphocytes Absolute 1.5 0.6 - 5.1 K/cmm    Monocytes Absolute 0.7 0.1 - 1.7 K/cmm    Eosinophils Absolute 0.1 0.0 - 0.8 K/cmm    Basophils Absolute 0.0 0.0 - 0.3 K/cmm   Collect Blood    Collection Time: 11/21/21  8:45 AM   Result Value Ref Range    Collect Blood Label Notification    Hemoglobin A1C    Collection Time: 11/21/21  8:45 AM   Result Value Ref Range    Hgb A1C, % 6.4 %    Estimated Average Glucose 137 mg/dL   ECG 12 lead (Stat) (Cardiac Related)    Collection Time: 11/21/21  9:55 AM   Result Value Ref Range    Patient Age 51 years    Patient DOB [birth year]     Patient Height      Patient Weight      Interpretation Text       Sinus rhythm  Short PR interval  Abnormal R-wave progression, late transition  Baseline wander in lead(s) V3  Compared to ECG 08/23/2021 16:00:06  Short PR interval now present  Electronically Signed On 11-23-2021 16:10:13 EDT by Karsten Fells      Physician Interpreter Karsten Fells     Ventricular Rate 74 //min    QRS Duration 103 ms    P-R Interval 44 ms    Q-T Interval 402 ms    Q-T Interval(Corrected) 446 ms    P Wave Axis 65 deg    QRS Axis 13 deg    T Axis 54 years   Urinalysis w Microscopic and Culture if Indicated    Collection Time: 11/21/21 11:16 AM    Specimen:  Urine, Random   Result Value Ref Range    Color, UA Straw Colorless,Yellow,Straw    Clarity, UA Clear Clear    Urine Specific Gravity 1.008 1.001 - 1.040    pH, Urine 6.0 5.0 - 8.0 pH    Protein, UR >=500 (A) Negative mg/dL    Glucose, UA 50 (A) Negative mg/dL    Ketones UA Negative Negative,5 mg/dL    Bilirubin, UA Negative Negative    Blood, UA Negative Negative    Nitrite, UA Negative Negative    Urobilinogen, UA Normal Normal mg/dL    Leukocyte Esterase, UA Negative Negative Leu/uL  UR Micro Performed     WBC, UA 1 0 - 4 /hpf    RBC, UA 1 0 - 4 /hpf   Dextrose Stick Glucose    Collection Time: 11/21/21  3:40 PM   Result Value Ref Range    Glucose POCT 108 (H) 71 - 99 mg/dL   Dextrose Stick Glucose    Collection Time: 11/21/21  5:39 PM   Result Value Ref Range    Glucose POCT 167 (H) 71 - 99 mg/dL   Dextrose Stick Glucose    Collection Time: 11/21/21  9:07 PM   Result Value Ref Range    Glucose POCT 155 (H) 71 - 99 mg/dL   Dextrose Stick Glucose    Collection Time: 11/22/21  4:00 AM   Result Value Ref Range    Glucose POCT 98 71 - 99 mg/dL   CBC and differential    Collection Time: 11/22/21  5:15 AM   Result Value Ref Range    WBC 9.1 4.0 - 11.0 K/cmm    RBC 3.18 (L) 4.00 - 5.70 M/cmm    Hemoglobin 9.4 (L) 13.0 - 17.5 gm/dL    Hematocrit 29.1 (L) 39.0 - 52.5 %    MCV 92 80 - 100 fL    MCH 30 28 - 35 pg    MCHC 32 32 - 36 gm/dL    RDW 13.6 11.0 - 14.0 %    PLT CT 228 130 - 440 K/cmm    MPV 9.5 6.0 - 10.0 fL    Neutrophils % 75.5 42.0 - 78.0 %    Lymphocytes 15.5 15.0 - 46.0 %    Monocytes 7.9 3.0 - 15.0 %    Eosinophils % 1.0 0.0 - 7.0 %    Basophils % 0.1 0.0 - 3.0 %    Neutrophils Absolute 6.9 1.7 - 8.6 K/cmm    Lymphocytes Absolute 1.4 0.6 - 5.1 K/cmm    Monocytes Absolute 0.7 0.1 - 1.7 K/cmm    Eosinophils Absolute 0.1 0.0 - 0.8 K/cmm    Basophils Absolute 0.0 0.0 - 0.3 K/cmm   Comprehensive metabolic panel    Collection Time: 11/22/21  5:15 AM   Result Value Ref Range    Sodium 142 136 - 147 mMol/L     Potassium 4.2 3.5 - 5.3 mMol/L    Chloride 111 (H) 98 - 110 mMol/L    CO2 23 20 - 30 mMol/L    Calcium 8.7 8.5 - 10.5 mg/dL    Glucose 88 71 - 99 mg/dL    Creatinine 4.04 (H) 0.80 - 1.30 mg/dL    BUN 59 (H) 7 - 22 mg/dL    Protein, Total 5.8 (L) 6.0 - 8.3 gm/dL    Albumin 2.7 (L) 3.5 - 5.0 gm/dL    Alkaline Phosphatase 148 (H) 40 - 145 U/L    ALT 16 0 - 55 U/L    AST (SGOT) 13 10 - 42 U/L    Bilirubin, Total 0.3 0.1 - 1.2 mg/dL    Albumin/Globulin Ratio 0.87 0.80 - 2.00 Ratio    Anion Gap 12.2 7.0 - 18.0 mMol/L    BUN / Creatinine Ratio 14.6 10.0 - 30.0 Ratio    EGFR 16 (L) 60 - 150 mL/min/1.67m2    Osmolality Calculated 299 275 - 300 mOsm/kg    Globulin 3.1 2.0 - 4.0 gm/dL   Hepatic function panel (LFT)    Collection Time: 11/22/21  5:15 AM   Result Value Ref Range  Protein, Total 5.8 (L) 6.0 - 8.3 gm/dL    Albumin 2.7 (L) 3.5 - 5.0 gm/dL    Alkaline Phosphatase 148 (H) 40 - 145 U/L    ALT 16 0 - 55 U/L    AST (SGOT) 13 10 - 42 U/L    Bilirubin, Total 0.3 0.1 - 1.2 mg/dL    Bilirubin Direct 0.2 0.0 - 0.3 mg/dL    Albumin/Globulin Ratio 0.87 0.80 - 2.00 Ratio    Globulin 3.1 2.0 - 4.0 gm/dL   Dextrose Stick Glucose    Collection Time: 11/22/21  7:30 AM   Result Value Ref Range    Glucose POCT 98 71 - 99 mg/dL   Dextrose Stick Glucose    Collection Time: 11/22/21 12:23 PM   Result Value Ref Range    Glucose POCT 99 71 - 99 mg/dL   Dextrose Stick Glucose    Collection Time: 11/22/21  5:48 PM   Result Value Ref Range    Glucose POCT 88 71 - 99 mg/dL   Dextrose Stick Glucose    Collection Time: 11/22/21  9:14 PM   Result Value Ref Range    Glucose POCT 89 71 - 99 mg/dL   Dextrose Stick Glucose    Collection Time: 11/23/21  3:47 AM   Result Value Ref Range    Glucose POCT 100 (H) 71 - 99 mg/dL   Basic Metabolic Panel    Collection Time: 11/23/21  5:23 AM   Result Value Ref Range    Sodium 142 136 - 147 mMol/L    Potassium 3.7 3.5 - 5.3 mMol/L    Chloride 105 98 - 110 mMol/L    CO2 24 20 - 30 mMol/L    Calcium 8.6 8.5 -  10.5 mg/dL    Glucose 76 71 - 99 mg/dL    Creatinine 4.45 (H) 0.80 - 1.30 mg/dL    BUN 65 (H) 7 - 22 mg/dL    Anion Gap 16.7 7.0 - 18.0 mMol/L    BUN / Creatinine Ratio 14.6 10.0 - 30.0 Ratio    EGFR 15 (L) 60 - 150 mL/min/1.62m2    Osmolality Calculated 301 (H) 275 - 300 mOsm/kg   Hemoglobin and hematocrit, blood    Collection Time: 11/23/21  5:23 AM   Result Value Ref Range    Hemoglobin 9.7 (L) 13.0 - 17.5 gm/dL    Hematocrit 30.4 (L) 39.0 - 52.5 %   Dextrose Stick Glucose    Collection Time: 11/23/21  7:36 AM   Result Value Ref Range    Glucose POCT 86 71 - 99 mg/dL   Dextrose Stick Glucose    Collection Time: 11/23/21 11:41 AM   Result Value Ref Range    Glucose POCT 148 (H) 71 - 99 mg/dL   Dextrose Stick Glucose    Collection Time: 11/23/21  5:44 PM   Result Value Ref Range    Glucose POCT 103 (H) 71 - 99 mg/dL   Dextrose Stick Glucose    Collection Time: 11/23/21  9:10 PM   Result Value Ref Range    Glucose POCT 153 (H) 71 - 99 mg/dL   Dextrose Stick Glucose    Collection Time: 11/24/21  3:33 AM   Result Value Ref Range    Glucose POCT 111 (H) 71 - 99 mg/dL   Basic Metabolic Panel    Collection Time: 11/24/21  4:56 AM   Result Value Ref Range    Sodium 140 136 - 147 mMol/L  Potassium 3.7 3.5 - 5.3 mMol/L    Chloride 102 98 - 110 mMol/L    CO2 29 20 - 30 mMol/L    Calcium 9.0 8.5 - 10.5 mg/dL    Glucose 119 (H) 71 - 99 mg/dL    Creatinine 4.82 (H) 0.80 - 1.30 mg/dL    BUN 69 (H) 7 - 22 mg/dL    Anion Gap 12.7 7.0 - 18.0 mMol/L    BUN / Creatinine Ratio 14.3 10.0 - 30.0 Ratio    EGFR 13 (L) 60 - 150 mL/min/1.47m2    Osmolality Calculated 301 (H) 275 - 300 mOsm/kg   Magnesium    Collection Time: 11/24/21  4:56 AM   Result Value Ref Range    Magnesium 1.8 1.6 - 2.6 mg/dL   Dextrose Stick Glucose    Collection Time: 11/24/21  8:00 AM   Result Value Ref Range    Glucose POCT 120 (H) 71 - 99 mg/dL   Dextrose Stick Glucose    Collection Time: 11/24/21 12:26 PM   Result Value Ref Range    Glucose POCT 158 (H) 71 -  99 mg/dL        Radiology Results:  XR Chest 2 Views    Result Date: 11/21/2021  IMPRESSION: Interstitial edema and bilateral effusions. Atelectasis versus alveolar edema in the lung bases. ReadingStation:[id number]     ASSESSMENT and PLAN   MDM: The patient has responded well to the Lasix 80 mg twice daily.  Will discuss diuretics with his nephrologist.  He is feeling back to his baseline.  He is having some pruritus and I have asked the wife to get a mentholated lotion such as Sarna.  He is not having any nausea.  I will check his BMP.  Told him to hold off on his potassium until we have checked his electrolytes.  The ulcer is healing nicely and I do not think we need to continue the Unna boots because his lower extremity edema has improved markedly.  He is now sleeping and denies shortness of breath.    Problem List Items Addressed This Visit       CKD (chronic kidney disease) stage 5, GFR less than 15 ml/min - Primary    Relevant Orders    Basic Metabolic Panel     Other Visit Diagnoses       Acute on chronic congestive heart failure, unspecified heart failure type        Relevant Orders    Basic Metabolic Panel    Ulcer of left lower extremity, limited to breakdown of skin                 Follow up  Return in about 4 weeks (around 12/28/2021).     Patient was counseled on possible medicine side effects which may include rash, swelling and/or stomach upset.  Patient was instructed to notify me or the ER if they experience problems.    No orders of the defined types were placed in this encounter.       Before leaving the office, the patient was informed of all ordered diagnostic tests and consults.     If you have not heard back from [place] about test results in 14 days, please call the office at [phone].    Signed by: Rhunette Croft, MD    The patient's electronic medical record was reviewed, any changes in the past medical history, past surgical history, medications, diagnostic tests were noted, and the  record  was updated accordingly.     Discussed option available for my chart which provides electronic access to diagnostic results.

## 2021-11-30 NOTE — Patient Instructions (Addendum)
Try Sarna or eucerin itch relief. Check gold bond   2. Let your kidney doctor know about the itching.   3. Stop the potassium because that can build up  in your blood stream.    4. Gaining 3 labs in a day or 5 lbs in a week will need to have adjustment to your diuretic.

## 2021-12-01 ENCOUNTER — Telehealth (RURAL_HEALTH_CENTER): Payer: Self-pay

## 2021-12-01 NOTE — Telephone Encounter (Signed)
Called and left a VM for [name] letting him know to get his blood work drawn, also let him know he made need to start taking potassium due to the increased lasix intake. I told him to call back with any questions.-AN MA

## 2021-12-01 NOTE — Telephone Encounter (Signed)
-----   Message from Rhunette Croft, MD sent at 11/30/2021  9:23 PM EDT -----  Please call patient and ask him to come in to have his potassium checked.  He may need the potassium supplementation as long as we have him on the increased dose of Lasix.

## 2021-12-03 MED ORDER — POTASSIUM CHLORIDE ER 10 MEQ PO TBCR
10.0000 meq | EXTENDED_RELEASE_TABLET | Freq: Two times a day (BID) | ORAL | 0 refills | Status: AC
Start: 2021-12-03 — End: 2021-12-08

## 2021-12-07 ENCOUNTER — Ambulatory Visit
Admission: RE | Admit: 2021-12-07 | Discharge: 2021-12-07 | Disposition: A | Discharge status: Home / Self Care | Payer: PRIVATE HEALTH INSURANCE | Source: Ambulatory Visit | Attending: Family Medicine | Admitting: Family Medicine

## 2021-12-07 DIAGNOSIS — I509 Heart failure, unspecified: Secondary | ICD-10-CM | POA: Insufficient documentation

## 2021-12-07 DIAGNOSIS — N185 Chronic kidney disease, stage 5: Secondary | ICD-10-CM | POA: Insufficient documentation

## 2021-12-07 LAB — BASIC METABOLIC PANEL
Anion Gap: 16.7 mMol/L (ref 7.0–18.0)
BUN / Creatinine Ratio: 16.2 Ratio (ref 10.0–30.0)
BUN: 82 mg/dL — ABNORMAL HIGH (ref 7–22)
CO2: 25.6 mMol/L (ref 20.0–30.0)
Calcium: 9.6 mg/dL (ref 8.5–10.5)
Chloride: 102 mMol/L (ref 98–110)
Creatinine: 5.06 mg/dL — ABNORMAL HIGH (ref 0.80–1.30)
EGFR: 12 mL/min/{1.73_m2} — ABNORMAL LOW (ref 60–150)
Glucose: 127 mg/dL — ABNORMAL HIGH (ref 71–99)
Osmolality Calculated: 306 mOsm/kg — ABNORMAL HIGH (ref 275–300)
Potassium: 4.3 mMol/L (ref 3.5–5.3)
Sodium: 140 mMol/L (ref 136–147)

## 2021-12-09 ENCOUNTER — Other Ambulatory Visit (RURAL_HEALTH_CENTER): Payer: Self-pay | Admitting: Family Medicine

## 2021-12-10 ENCOUNTER — Telehealth (RURAL_HEALTH_CENTER): Payer: Self-pay

## 2021-12-10 MED ORDER — ATORVASTATIN CALCIUM 40 MG PO TABS
40.0000 mg | ORAL_TABLET | Freq: Every evening | ORAL | 3 refills | Status: AC
Start: 2021-12-10 — End: ?

## 2021-12-10 NOTE — Telephone Encounter (Signed)
Received a missed call from this patient and he just asked [place] to call him back, I attempted, no answer, no voicemail.

## 2021-12-12 ENCOUNTER — Other Ambulatory Visit (RURAL_HEALTH_CENTER): Payer: Self-pay | Admitting: Internal Medicine

## 2021-12-12 DIAGNOSIS — E782 Mixed hyperlipidemia: Secondary | ICD-10-CM

## 2021-12-20 ENCOUNTER — Ambulatory Visit (RURAL_HEALTH_CENTER): Payer: Self-pay

## 2021-12-22 ENCOUNTER — Encounter (INDEPENDENT_AMBULATORY_CARE_PROVIDER_SITE_OTHER): Payer: Self-pay

## 2021-12-23 ENCOUNTER — Encounter (INDEPENDENT_AMBULATORY_CARE_PROVIDER_SITE_OTHER): Payer: Self-pay

## 2021-12-23 ENCOUNTER — Ambulatory Visit (RURAL_HEALTH_CENTER): Payer: Self-pay | Admitting: Family Medicine

## 2022-01-19 ENCOUNTER — Ambulatory Visit: Payer: Self-pay

## 2022-01-21 ENCOUNTER — Encounter (INDEPENDENT_AMBULATORY_CARE_PROVIDER_SITE_OTHER): Payer: Self-pay

## 2022-01-22 ENCOUNTER — Encounter (INDEPENDENT_AMBULATORY_CARE_PROVIDER_SITE_OTHER): Payer: Self-pay

## 2022-02-21 ENCOUNTER — Encounter (INDEPENDENT_AMBULATORY_CARE_PROVIDER_SITE_OTHER): Payer: Self-pay

## 2022-02-22 ENCOUNTER — Encounter (INDEPENDENT_AMBULATORY_CARE_PROVIDER_SITE_OTHER): Payer: Self-pay

## 2022-03-24 ENCOUNTER — Encounter (INDEPENDENT_AMBULATORY_CARE_PROVIDER_SITE_OTHER): Payer: Self-pay

## 2022-03-25 ENCOUNTER — Encounter (INDEPENDENT_AMBULATORY_CARE_PROVIDER_SITE_OTHER): Payer: Self-pay

## 2022-04-22 ENCOUNTER — Encounter (INDEPENDENT_AMBULATORY_CARE_PROVIDER_SITE_OTHER): Payer: Self-pay

## 2022-04-23 ENCOUNTER — Encounter (INDEPENDENT_AMBULATORY_CARE_PROVIDER_SITE_OTHER): Payer: Self-pay

## 2022-05-03 ENCOUNTER — Ambulatory Visit (INDEPENDENT_AMBULATORY_CARE_PROVIDER_SITE_OTHER): Payer: PRIVATE HEALTH INSURANCE | Admitting: Surgery

## 2022-05-23 ENCOUNTER — Encounter (INDEPENDENT_AMBULATORY_CARE_PROVIDER_SITE_OTHER): Payer: Self-pay

## 2022-05-24 ENCOUNTER — Encounter (INDEPENDENT_AMBULATORY_CARE_PROVIDER_SITE_OTHER): Payer: Self-pay

## 2022-08-29 IMAGING — MR MRI SHOULDER RT WO CONTRAST
4 of 5 series · 32 of 40 positions shown · non-contrast
Comparison: None.

Images Obtained from [facility]
MRI SHOULDER RT WO CONTRAST
INDICATION: Bicipital tendinitis, right shoulder.
TECHNIQUE: Multiplanar, multiecho imaging of the right shoulder was performed, including T1-weighted and fluid sensitive sequences without intravenous contrast.

[Series 2: t2_axial_fs · axial · 4.0mm · 0.62mm/px · z∈[-52,+59]mm · 8 of 28 slices shown]
[im 1/28]
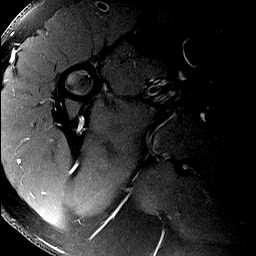
[im 4/28]
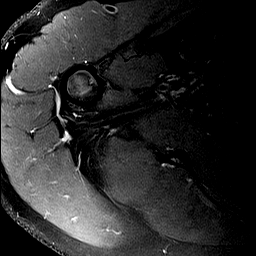
[im 8/28]
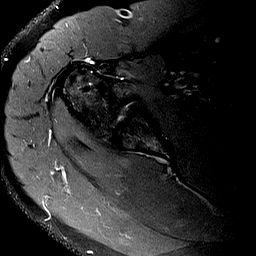
[im 12/28]
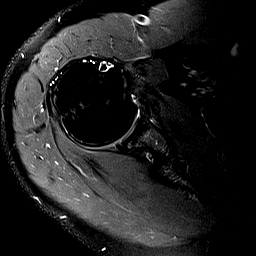
[im 16/28]
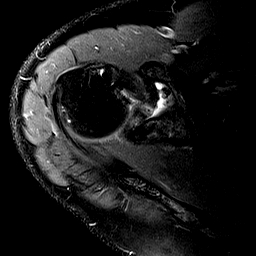
[im 20/28]
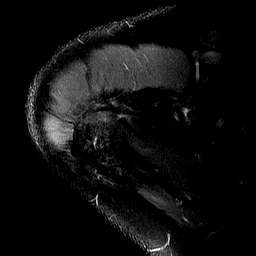
[im 24/28]
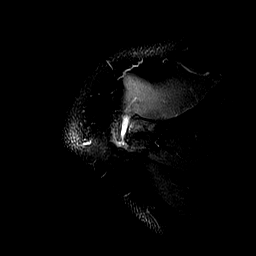
[im 28/28]
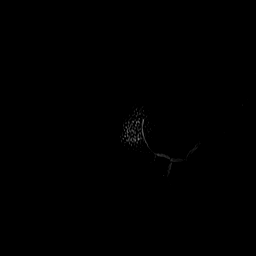

[Series 3: t2_cor_obl_fs · oblique · 4.0mm · 0.50mm/px · 6 of 21 slices shown]
[im 1/21]
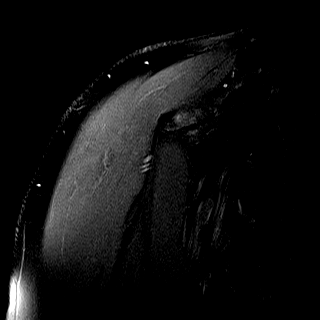
[im 5/21]
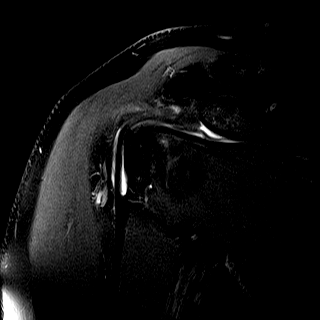
[im 9/21]
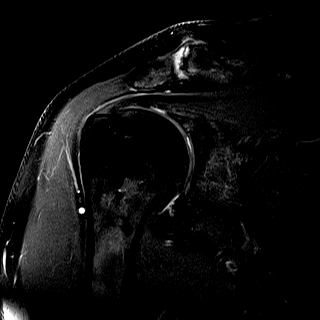
[im 13/21]
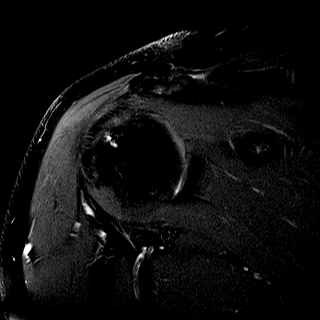
[im 17/21]
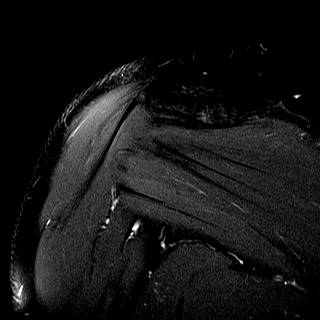
[im 21/21]
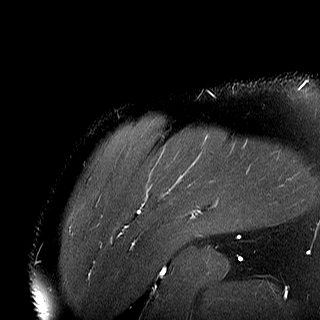

[Series 4: t1_sag_obl · sagittal · 4.0mm · 0.50mm/px · 9 of 31 slices shown]
[im 1/31]
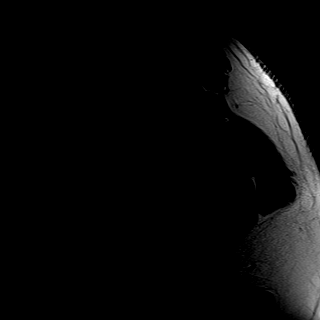
[im 4/31]
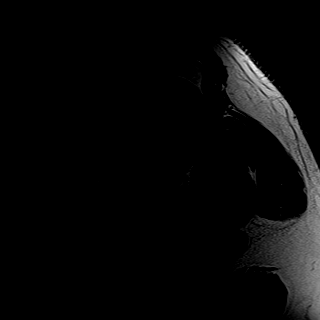
[im 8/31]
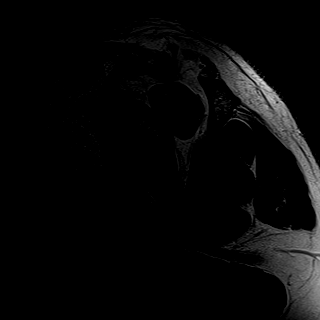
[im 12/31]
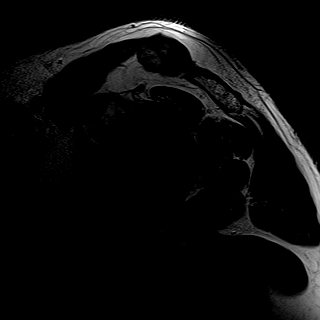
[im 16/31]
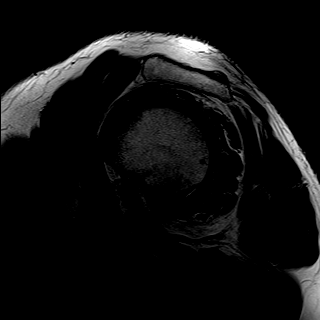
[im 19/31]
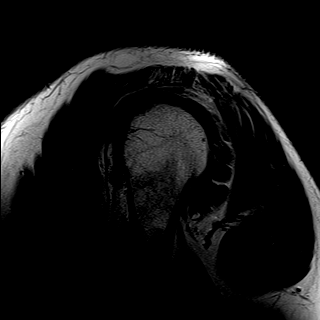
[im 23/31]
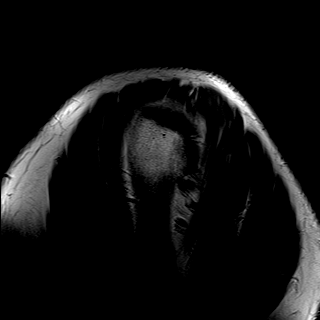
[im 27/31]
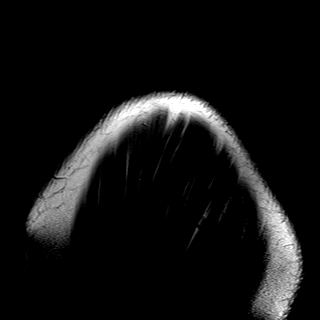
[im 31/31]
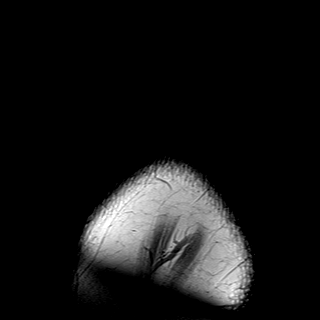

[Series 5: t2_sag_obl_fs · sagittal · 4.0mm · 0.62mm/px · 9 of 31 slices shown]
[im 1/31]
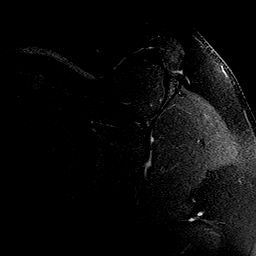
[im 4/31]
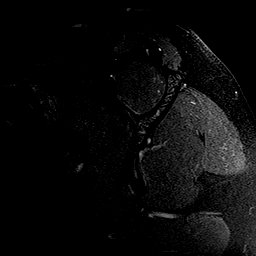
[im 8/31]
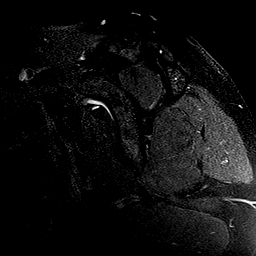
[im 12/31]
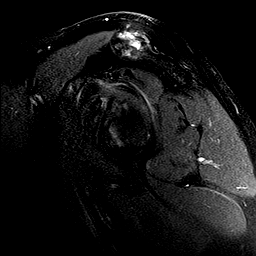
[im 16/31]
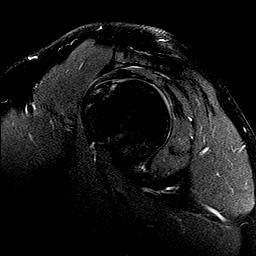
[im 19/31]
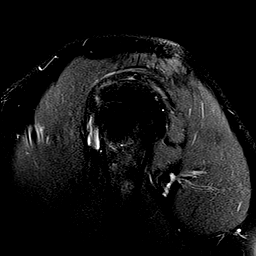
[im 23/31]
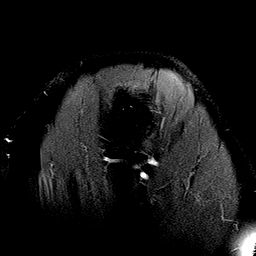
[im 27/31]
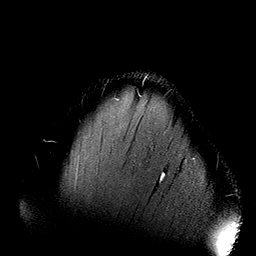
[im 31/31]
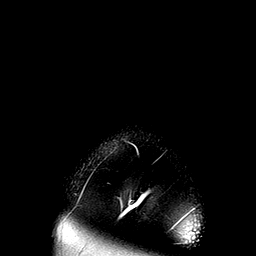

[32 of 40 positions shown; findings below may reference images not displayed]

FINDINGS: Rotator cuff:
Supraspinatus and infraspinatus: All tendinosis of the supraspinatus. Infraspinatus is intact. No tear.
Subscapularis: All tendinosis with small partial-thickness undersurface tear at the superior insertion.
Teres minor: Intact.
Cuff muscles: Normal in size and signal intensity.  No fatty infiltration.
Acromioclavicular joint: Mild DJD.
TAXES bursa: No bursitis.
Long head biceps tendon: Intact, with normal course.
Rotator Interval: Edema present.
Labrum: Degenerative tearing of the posterior labrum. Degeneration and fraying of the anterior inferior labrum and superior labrum.
Cartilage: There chondral surface irregularity.
Marrow: Within normal limits.
No additional findings.
IMPRESSION: 1.  Intact long head biceps tendon.
2.  Mild tendinosis of the supraspinatus. No tear.
3.  Mild tendinosis of the subscapularis with small partial-thickness undersurface tear at the superior insertion.
4.  Mild glenohumeral and AC joint DJD.

## 2022-10-24 ENCOUNTER — Encounter (INDEPENDENT_AMBULATORY_CARE_PROVIDER_SITE_OTHER): Payer: Self-pay
